# Patient Record
Sex: Female | Born: 1935 | Race: Black or African American | Hispanic: No | Smoking: Former smoker
Health system: Southern US, Community
[De-identification: ages and names within clinical notes are randomized; demographics above are authoritative.]

## PROBLEM LIST (undated history)

## (undated) DIAGNOSIS — I495 Sick sinus syndrome: Secondary | ICD-10-CM

## (undated) DIAGNOSIS — R0989 Other specified symptoms and signs involving the circulatory and respiratory systems: Secondary | ICD-10-CM

## (undated) DIAGNOSIS — I2510 Atherosclerotic heart disease of native coronary artery without angina pectoris: Secondary | ICD-10-CM

## (undated) DIAGNOSIS — N83209 Unspecified ovarian cyst, unspecified side: Secondary | ICD-10-CM

## (undated) DIAGNOSIS — R609 Edema, unspecified: Secondary | ICD-10-CM

## (undated) DIAGNOSIS — R0602 Shortness of breath: Secondary | ICD-10-CM

## (undated) DIAGNOSIS — R079 Chest pain, unspecified: Secondary | ICD-10-CM

## (undated) DIAGNOSIS — R569 Unspecified convulsions: Secondary | ICD-10-CM

## (undated) DIAGNOSIS — D329 Benign neoplasm of meninges, unspecified: Secondary | ICD-10-CM

## (undated) DIAGNOSIS — I480 Paroxysmal atrial fibrillation: Secondary | ICD-10-CM

## (undated) DIAGNOSIS — D496 Neoplasm of unspecified behavior of brain: Secondary | ICD-10-CM

## (undated) DIAGNOSIS — R42 Dizziness and giddiness: Secondary | ICD-10-CM

## (undated) DIAGNOSIS — M1990 Unspecified osteoarthritis, unspecified site: Secondary | ICD-10-CM

## (undated) DIAGNOSIS — M549 Dorsalgia, unspecified: Secondary | ICD-10-CM

## (undated) DIAGNOSIS — F32A Depression, unspecified: Secondary | ICD-10-CM

## (undated) DIAGNOSIS — I4892 Unspecified atrial flutter: Secondary | ICD-10-CM

## (undated) DIAGNOSIS — I219 Acute myocardial infarction, unspecified: Secondary | ICD-10-CM

## (undated) DIAGNOSIS — K219 Gastro-esophageal reflux disease without esophagitis: Secondary | ICD-10-CM

## (undated) DIAGNOSIS — Z7901 Long term (current) use of anticoagulants: Secondary | ICD-10-CM

## (undated) DIAGNOSIS — F320 Major depressive disorder, single episode, mild: Secondary | ICD-10-CM

## (undated) DIAGNOSIS — E785 Hyperlipidemia, unspecified: Secondary | ICD-10-CM

## (undated) DIAGNOSIS — H4910 Fourth [trochlear] nerve palsy, unspecified eye: Secondary | ICD-10-CM

## (undated) DIAGNOSIS — S83519A Sprain of anterior cruciate ligament of unspecified knee, initial encounter: Secondary | ICD-10-CM

## (undated) DIAGNOSIS — N189 Chronic kidney disease, unspecified: Secondary | ICD-10-CM

## (undated) DIAGNOSIS — H8110 Benign paroxysmal vertigo, unspecified ear: Secondary | ICD-10-CM

## (undated) DIAGNOSIS — R413 Other amnesia: Secondary | ICD-10-CM

## (undated) HISTORY — DX: Major depressive disorder, single episode, mild: F32.0

## (undated) HISTORY — DX: Atherosclerotic heart disease of native coronary artery without angina pectoris: I25.10

## (undated) HISTORY — DX: Chronic kidney disease, unspecified: N18.9

## (undated) HISTORY — DX: Sprain of anterior cruciate ligament of unspecified knee, initial encounter: S83.519A

## (undated) HISTORY — DX: Long term (current) use of anticoagulants: Z79.01

## (undated) HISTORY — DX: Fourth (trochlear) nerve palsy, unspecified eye: H49.10

## (undated) HISTORY — DX: Neoplasm of unspecified behavior of brain: D49.6

## (undated) HISTORY — DX: Gastro-esophageal reflux disease without esophagitis: K21.9

## (undated) HISTORY — DX: Benign paroxysmal vertigo, unspecified ear: H81.10

## (undated) HISTORY — DX: Sick sinus syndrome: I49.5

## (undated) HISTORY — DX: Other amnesia: R41.3

## (undated) HISTORY — DX: Hyperlipidemia, unspecified: E78.5

## (undated) HISTORY — DX: Dorsalgia, unspecified: M54.9

## (undated) HISTORY — DX: Benign neoplasm of meninges, unspecified: D32.9

## (undated) HISTORY — DX: Unspecified osteoarthritis, unspecified site: M19.90

## (undated) HISTORY — DX: Acute myocardial infarction, unspecified: I21.9

## (undated) HISTORY — DX: Paroxysmal atrial fibrillation: I48.0

## (undated) HISTORY — DX: Depression, unspecified: F32.A

## (undated) HISTORY — DX: Unspecified atrial flutter: I48.92

## (undated) HISTORY — DX: Chest pain, unspecified: R07.9

## (undated) HISTORY — DX: Unspecified ovarian cyst, unspecified side: N83.209

## (undated) HISTORY — DX: Morbid (severe) obesity due to excess calories: E66.01

## (undated) HISTORY — PX: KNEE SURGERY: SHX244

## (undated) HISTORY — DX: Unspecified convulsions: R56.9

## (undated) HISTORY — DX: Shortness of breath: R06.02

## (undated) HISTORY — DX: Dizziness and giddiness: R42

## (undated) HISTORY — DX: Other specified symptoms and signs involving the circulatory and respiratory systems: R09.89

## (undated) HISTORY — DX: Edema, unspecified: R60.9

---

## 1987-12-23 HISTORY — PX: OTHER SURGICAL HISTORY: SHX169

## 1987-12-23 HISTORY — PX: CRANIOTOMY: SHX93

## 1998-04-16 ENCOUNTER — Ambulatory Visit (HOSPITAL_COMMUNITY): Admission: RE | Admit: 1998-04-16 | Discharge: 1998-04-16 | Payer: Self-pay | Admitting: Cardiology

## 1999-03-11 ENCOUNTER — Emergency Department (HOSPITAL_COMMUNITY): Admission: EM | Admit: 1999-03-11 | Discharge: 1999-03-11 | Payer: Self-pay | Admitting: Emergency Medicine

## 2002-01-31 ENCOUNTER — Encounter: Payer: Self-pay | Admitting: Internal Medicine

## 2002-01-31 ENCOUNTER — Encounter: Admission: RE | Admit: 2002-01-31 | Discharge: 2002-01-31 | Payer: Self-pay | Admitting: Hematology and Oncology

## 2003-02-22 ENCOUNTER — Encounter (INDEPENDENT_AMBULATORY_CARE_PROVIDER_SITE_OTHER): Payer: Self-pay | Admitting: *Deleted

## 2003-02-22 ENCOUNTER — Ambulatory Visit (HOSPITAL_COMMUNITY): Admission: RE | Admit: 2003-02-22 | Discharge: 2003-02-22 | Payer: Self-pay | Admitting: Gastroenterology

## 2004-01-17 ENCOUNTER — Ambulatory Visit (HOSPITAL_COMMUNITY): Admission: RE | Admit: 2004-01-17 | Discharge: 2004-01-17 | Payer: Self-pay | Admitting: Obstetrics and Gynecology

## 2004-04-17 ENCOUNTER — Inpatient Hospital Stay (HOSPITAL_COMMUNITY): Admission: RE | Admit: 2004-04-17 | Discharge: 2004-04-20 | Payer: Self-pay | Admitting: Obstetrics and Gynecology

## 2004-04-17 ENCOUNTER — Encounter (INDEPENDENT_AMBULATORY_CARE_PROVIDER_SITE_OTHER): Payer: Self-pay | Admitting: Specialist

## 2004-12-22 DIAGNOSIS — R569 Unspecified convulsions: Secondary | ICD-10-CM

## 2004-12-22 HISTORY — DX: Unspecified convulsions: R56.9

## 2005-03-20 ENCOUNTER — Inpatient Hospital Stay (HOSPITAL_COMMUNITY): Admission: EM | Admit: 2005-03-20 | Discharge: 2005-03-21 | Payer: Self-pay | Admitting: Emergency Medicine

## 2005-07-18 ENCOUNTER — Emergency Department (HOSPITAL_COMMUNITY): Admission: EM | Admit: 2005-07-18 | Discharge: 2005-07-18 | Payer: Self-pay | Admitting: Emergency Medicine

## 2005-10-23 ENCOUNTER — Inpatient Hospital Stay (HOSPITAL_COMMUNITY): Admission: EM | Admit: 2005-10-23 | Discharge: 2005-10-26 | Payer: Self-pay | Admitting: Emergency Medicine

## 2006-09-14 ENCOUNTER — Encounter: Admission: RE | Admit: 2006-09-14 | Discharge: 2006-09-14 | Payer: Self-pay | Admitting: Neurology

## 2007-01-05 ENCOUNTER — Inpatient Hospital Stay (HOSPITAL_COMMUNITY): Admission: RE | Admit: 2007-01-05 | Discharge: 2007-01-08 | Payer: Self-pay | Admitting: Orthopaedic Surgery

## 2009-04-04 ENCOUNTER — Encounter: Admission: RE | Admit: 2009-04-04 | Discharge: 2009-04-04 | Payer: Self-pay | Admitting: Family Medicine

## 2009-04-07 ENCOUNTER — Ambulatory Visit: Payer: Self-pay | Admitting: Internal Medicine

## 2009-04-07 ENCOUNTER — Inpatient Hospital Stay (HOSPITAL_COMMUNITY): Admission: EM | Admit: 2009-04-07 | Discharge: 2009-04-12 | Payer: Self-pay | Admitting: Emergency Medicine

## 2009-04-23 ENCOUNTER — Inpatient Hospital Stay (HOSPITAL_COMMUNITY): Admission: AD | Admit: 2009-04-23 | Discharge: 2009-04-29 | Payer: Self-pay | Admitting: Cardiology

## 2009-04-27 HISTORY — PX: PACEMAKER INSERTION: SHX728

## 2010-01-24 ENCOUNTER — Encounter: Admission: RE | Admit: 2010-01-24 | Discharge: 2010-01-24 | Payer: Self-pay | Admitting: Neurology

## 2010-04-26 ENCOUNTER — Encounter: Admission: RE | Admit: 2010-04-26 | Discharge: 2010-04-26 | Payer: Self-pay | Admitting: Cardiology

## 2010-04-30 ENCOUNTER — Ambulatory Visit (HOSPITAL_COMMUNITY): Admission: RE | Admit: 2010-04-30 | Discharge: 2010-04-30 | Payer: Self-pay | Admitting: Cardiology

## 2010-04-30 HISTORY — PX: CARDIAC CATHETERIZATION: SHX172

## 2011-03-11 LAB — POCT I-STAT 3, VENOUS BLOOD GAS (G3P V)
Acid-base deficit: 7 mmol/L — ABNORMAL HIGH (ref 0.0–2.0)
O2 Saturation: 66 %
TCO2: 20 mmol/L (ref 0–100)
pH, Ven: 7.301 — ABNORMAL HIGH (ref 7.250–7.300)

## 2011-03-11 LAB — POCT I-STAT 3, ART BLOOD GAS (G3+)
Bicarbonate: 18.4 mEq/L — ABNORMAL LOW (ref 20.0–24.0)
pH, Arterial: 7.328 — ABNORMAL LOW (ref 7.350–7.400)

## 2011-04-01 LAB — CBC
HCT: 34.4 % — ABNORMAL LOW (ref 36.0–46.0)
HCT: 36.5 % (ref 36.0–46.0)
HCT: 39.8 % (ref 36.0–46.0)
Hemoglobin: 12.2 g/dL (ref 12.0–15.0)
Hemoglobin: 13.1 g/dL (ref 12.0–15.0)
MCHC: 33.5 g/dL (ref 30.0–36.0)
MCHC: 33.8 g/dL (ref 30.0–36.0)
MCHC: 33.8 g/dL (ref 30.0–36.0)
MCHC: 34.1 g/dL (ref 30.0–36.0)
MCV: 85.8 fL (ref 78.0–100.0)
Platelets: 282 10*3/uL (ref 150–400)
Platelets: 292 10*3/uL (ref 150–400)
RBC: 4.59 MIL/uL (ref 3.87–5.11)
RDW: 15.6 % — ABNORMAL HIGH (ref 11.5–15.5)
RDW: 15.8 % — ABNORMAL HIGH (ref 11.5–15.5)
RDW: 15.9 % — ABNORMAL HIGH (ref 11.5–15.5)
WBC: 10.3 10*3/uL (ref 4.0–10.5)
WBC: 7.9 10*3/uL (ref 4.0–10.5)

## 2011-04-01 LAB — COMPREHENSIVE METABOLIC PANEL
ALT: 12 U/L (ref 0–35)
Alkaline Phosphatase: 71 U/L (ref 39–117)
BUN: 31 mg/dL — ABNORMAL HIGH (ref 6–23)
CO2: 17 mEq/L — ABNORMAL LOW (ref 19–32)
Chloride: 105 mEq/L (ref 96–112)
Glucose, Bld: 133 mg/dL — ABNORMAL HIGH (ref 70–99)
Potassium: 3.2 mEq/L — ABNORMAL LOW (ref 3.5–5.1)
Sodium: 138 mEq/L (ref 135–145)
Total Bilirubin: 0.6 mg/dL (ref 0.3–1.2)

## 2011-04-01 LAB — CARDIAC PANEL(CRET KIN+CKTOT+MB+TROPI)
CK, MB: 1 ng/mL (ref 0.3–4.0)
Relative Index: INVALID (ref 0.0–2.5)
Total CK: 40 U/L (ref 7–177)
Total CK: 46 U/L (ref 7–177)
Troponin I: 0.03 ng/mL (ref 0.00–0.06)
Troponin I: 0.04 ng/mL (ref 0.00–0.06)

## 2011-04-01 LAB — BASIC METABOLIC PANEL
BUN: 25 mg/dL — ABNORMAL HIGH (ref 6–23)
CO2: 19 mEq/L (ref 19–32)
CO2: 23 mEq/L (ref 19–32)
Calcium: 8.9 mg/dL (ref 8.4–10.5)
Chloride: 105 mEq/L (ref 96–112)
Creatinine, Ser: 1.38 mg/dL — ABNORMAL HIGH (ref 0.4–1.2)
GFR calc Af Amer: 45 mL/min — ABNORMAL LOW (ref >60)
Glucose, Bld: 92 mg/dL (ref 70–99)
Potassium: 3.1 mEq/L — ABNORMAL LOW (ref 3.5–5.1)

## 2011-04-01 LAB — MAGNESIUM: Magnesium: 2.2 mg/dL (ref 1.5–2.5)

## 2011-04-01 LAB — URINALYSIS, ROUTINE W REFLEX MICROSCOPIC
Ketones, ur: NEGATIVE mg/dL
Nitrite: NEGATIVE
Protein, ur: NEGATIVE mg/dL

## 2011-04-01 LAB — HEPARIN LEVEL (UNFRACTIONATED)
Heparin Unfractionated: 0.26 IU/mL — ABNORMAL LOW (ref 0.30–0.70)
Heparin Unfractionated: 0.36 IU/mL (ref 0.30–0.70)
Heparin Unfractionated: 0.47 IU/mL (ref 0.30–0.70)

## 2011-04-01 LAB — PROTIME-INR
INR: 1.2 (ref 0.00–1.49)
Prothrombin Time: 15.7 seconds — ABNORMAL HIGH (ref 11.6–15.2)

## 2011-04-01 LAB — TSH: TSH: 0.817 u[IU]/mL (ref 0.350–4.500)

## 2011-04-01 LAB — URINE MICROSCOPIC-ADD ON

## 2011-04-02 LAB — COMPREHENSIVE METABOLIC PANEL
ALT: 23 U/L (ref 0–35)
ALT: 28 U/L (ref 0–35)
AST: 29 U/L (ref 0–37)
Albumin: 3.3 g/dL — ABNORMAL LOW (ref 3.5–5.2)
Alkaline Phosphatase: 70 U/L (ref 39–117)
Alkaline Phosphatase: 80 U/L (ref 39–117)
CO2: 18 mEq/L — ABNORMAL LOW (ref 19–32)
Chloride: 106 mEq/L (ref 96–112)
GFR calc Af Amer: 34 mL/min — ABNORMAL LOW (ref >60)
GFR calc non Af Amer: 28 mL/min — ABNORMAL LOW (ref >60)
Glucose, Bld: 88 mg/dL (ref 70–99)
Potassium: 3 mEq/L — ABNORMAL LOW (ref 3.5–5.1)
Sodium: 134 mEq/L — ABNORMAL LOW (ref 135–145)
Sodium: 137 mEq/L (ref 135–145)
Total Bilirubin: 1 mg/dL (ref 0.3–1.2)
Total Protein: 7.5 g/dL (ref 6.0–8.3)

## 2011-04-02 LAB — CROSSMATCH: Antibody Screen: NEGATIVE

## 2011-04-02 LAB — DIFFERENTIAL
Basophils Absolute: 0 10*3/uL (ref 0.0–0.1)
Basophils Relative: 0 % (ref 0–1)
Basophils Relative: 0 % (ref 0–1)
Eosinophils Absolute: 0.2 10*3/uL (ref 0.0–0.7)
Eosinophils Absolute: 0.4 10*3/uL (ref 0.0–0.7)
Monocytes Absolute: 0.5 10*3/uL (ref 0.1–1.0)
Monocytes Relative: 10 % (ref 3–12)
Monocytes Relative: 8 % (ref 3–12)
Neutro Abs: 4.1 10*3/uL (ref 1.7–7.7)
Neutro Abs: 6 10*3/uL (ref 1.7–7.7)
Neutrophils Relative %: 68 % (ref 43–77)

## 2011-04-02 LAB — BASIC METABOLIC PANEL
BUN: 18 mg/dL (ref 6–23)
BUN: 20 mg/dL (ref 6–23)
Creatinine, Ser: 1.58 mg/dL — ABNORMAL HIGH (ref 0.4–1.2)
Creatinine, Ser: 1.68 mg/dL — ABNORMAL HIGH (ref 0.4–1.2)
GFR calc Af Amer: 39 mL/min — ABNORMAL LOW (ref >60)
GFR calc non Af Amer: 30 mL/min — ABNORMAL LOW (ref >60)
GFR calc non Af Amer: 32 mL/min — ABNORMAL LOW (ref >60)
GFR calc non Af Amer: 37 mL/min — ABNORMAL LOW (ref >60)
Glucose, Bld: 86 mg/dL (ref 70–99)
Potassium: 3.7 mEq/L (ref 3.5–5.1)
Potassium: 3.9 mEq/L (ref 3.5–5.1)
Potassium: 4.2 mEq/L (ref 3.5–5.1)
Sodium: 143 mEq/L (ref 135–145)

## 2011-04-02 LAB — HEMOGLOBIN AND HEMATOCRIT, BLOOD
HCT: 40.8 % (ref 36.0–46.0)
Hemoglobin: 12.5 g/dL (ref 12.0–15.0)
Hemoglobin: 13 g/dL (ref 12.0–15.0)

## 2011-04-02 LAB — CBC
HCT: 30.9 % — ABNORMAL LOW (ref 36.0–46.0)
HCT: 34.9 % — ABNORMAL LOW (ref 36.0–46.0)
Hemoglobin: 10.3 g/dL — ABNORMAL LOW (ref 12.0–15.0)
MCHC: 33.2 g/dL (ref 30.0–36.0)
MCV: 87.4 fL (ref 78.0–100.0)
Platelets: 167 10*3/uL (ref 150–400)
Platelets: 169 10*3/uL (ref 150–400)
Platelets: 182 10*3/uL (ref 150–400)
Platelets: 203 10*3/uL (ref 150–400)
RBC: 3.59 MIL/uL — ABNORMAL LOW (ref 3.87–5.11)
RDW: 15.1 % (ref 11.5–15.5)
RDW: 15.1 % (ref 11.5–15.5)
RDW: 15.2 % (ref 11.5–15.5)
WBC: 7.7 10*3/uL (ref 4.0–10.5)
WBC: 9.3 10*3/uL (ref 4.0–10.5)

## 2011-04-02 LAB — POCT I-STAT, CHEM 8
BUN: 24 mg/dL — ABNORMAL HIGH (ref 6–23)
Creatinine, Ser: 1.8 mg/dL — ABNORMAL HIGH (ref 0.4–1.2)
Hemoglobin: 12.9 g/dL (ref 12.0–15.0)
Potassium: 3.2 mEq/L — ABNORMAL LOW (ref 3.5–5.1)
Sodium: 135 mEq/L (ref 135–145)
TCO2: 17 mmol/L (ref 0–100)

## 2011-04-02 LAB — POCT CARDIAC MARKERS
CKMB, poc: 3.6 ng/mL (ref 1.0–8.0)
Myoglobin, poc: 415 ng/mL (ref 12–200)

## 2011-04-02 LAB — MAGNESIUM: Magnesium: 2.1 mg/dL (ref 1.5–2.5)

## 2011-04-02 LAB — APTT: aPTT: 34 seconds (ref 24–37)

## 2011-04-02 LAB — URIC ACID: Uric Acid, Serum: 9.8 mg/dL — ABNORMAL HIGH (ref 2.4–7.0)

## 2011-04-02 LAB — LACTIC ACID, PLASMA: Lactic Acid, Venous: 2 mmol/L (ref 0.5–2.2)

## 2011-04-02 LAB — HEMOCCULT GUIAC POC 1CARD (OFFICE): Fecal Occult Bld: POSITIVE

## 2011-04-02 LAB — GLUCOSE, CAPILLARY: Glucose-Capillary: 76 mg/dL (ref 70–99)

## 2011-05-06 NOTE — H&P (Signed)
[name], SCHOONMAKER NO.:  [id number]   MEDICAL RECORD NO.:  [id number]          PATIENT TYPE:  EMS   LOCATION:  MAJO                         FACILITY:  [facility]   PHYSICIAN:  Lucita Ferrara, MD         DATE OF BIRTH:  09-Jul-1932   DATE OF ADMISSION:  04/07/2009  DATE OF DISCHARGE:                              HISTORY & PHYSICAL   PRIMARY GASTROENTEROLOGIST:  Anselmo Rod, MD.   GASTROENTEROLOGIST DURING THIS ADMISSION:  Wilhemina Bonito. Marina Goodell, MD.   PRIMARY CARE PHYSICIAN:  Merlene Laughter. Renae Gloss, MD.   CHIEF COMPLAINT:  Rectal bleeding and crampy abdominal pain.   The patient is a 77 year old with rectal bleeding, which started 1.5  weeks back.  The rectal bleeding is accompanied by crampy left lower  quadrant abdominal pain.  There are episodes of bright red blood per  rectum as well.  Patient also has melenic, dark-color stools.  Abdominal  pain about 5/10 in intensity.  There is no nausea, vomiting, or  hematemesis.  Patient has never had symptoms like this before.   She has seen Dr. Loreta Ave prior for a colonoscopy.  No change in diet,  recent travel.  Colonoscopy, March 2004, for indication rule out polyps  and positive stool guaiac with findings of small sessile polyp and  polypectomy in transverse colon.   REVIEW OF SYSTEMS:  Twelve points are otherwise negative.  There is no  nausea or vomiting.  Patient still has an appetite and requests food in  the emergency room.  There is no urinary frequency, urgency, or burning.  There is no chest pain or shortness of breath.   PAST MEDICAL HISTORY:  Significant for:  1. History of a brain tumor status post craniotomy in 1989 and      subsequent gamma knife surgery in 1998.  2. Seizure disorder.  3. History of CAD status post MI status post PTCA in 1995.  4. Dyslipidemia.  5. Status post abdominal hysterectomy for fibroids.  6. Hiatal hernia/GERD.  7. Osteopenia.  8. Status post tonsillectomy and status post  appendectomy.  9. Benign positional vertigo.  10.Status post fall/left knee trauma/orthopedic surgery.  11.Hypertension.  12.Patient does use nonsteroidal anti-inflammatory medications/Motrin      frequently for osteoarthritic pain.   SOCIAL HISTORY:  Patient denies drugs or alcohol.   ALLERGIES:  PHENYTOIN.   MEDICATIONS AT HOME:  Include Oxycodone, Xopenex, aspirin, Motrin,  Prilosec, Topamax.   PHYSICAL EXAMINATION:  GENERAL:  The patient is in no acute distress.  Generally speaking, the patient is a morbidly obese lady.  She is in  somewhat discomfort complaining of back pain.  HEENT: She is normocephalic, atraumatic.  Mucous membranes are somewhat  dry.  NECK:  Supple.  VITAL SIGNS:  Blood pressure is 107/46, pulse is ranging anywhere from  55 to 113, respiratory rate 18, temperature 98.5, pulse-ox 98% on room  air.  CARDIOVASCULAR:  S1 S2, regular rate, rhythm, no murmurs, rubs, or  clicks.  ABDOMEN:  Soft, is not distended.  There is mild pain to light palpation  in left lower quadrant.  RECTAL:  Stool black, guaiac positive.  EXTREMITIES:  No clubbing, cyanosis, or edema.  NEURO:  Patient is alert and oriented x3.  Cranial nerves II-XII are  grossly intact.  SKIN:  Intact.  PSYCH:  She is anxious, otherwise normal.   EKG shows a normal sinus rhythm.  No ST T-wave changes.  CT scan of the  abdomen and pelvis shows  circumferential wall thickening extends from  mid transverse colon to the rectum.  Imaging features are most  suggestive of an infectious or inflammatory colitis.  There is also a 17  mm left adnexal cystic lesion, which probably is benign.  This is  abnormal in a postmenopausal female.  Followup imaging would be  recommended..  Hemoglobin 13, hematocrit 38.9, potassium 3, BUN 26, creatinine 2.52,  AST, ALT, and alk-phosphatase are 30, 23, and 80 respectively.  INR 1.0.   ASSESSMENT AND PLAN:  The patient is a 77 year old with:  1. Rectal bleeding  accompanied by abdominal cramping, left lower      quadrant abdominal pain.  Computerized tomography (CT) scan showing      colitis.  Rule out ischemia or ischemic colitis.  Gastroenterology      consulted.  We will type, cross, and hold, transfuse.  The patient      is hemoconcentrated.  Intravenous hydration.  We will go ahead and      admit to the stepdown unit and hemodynamic monitoring.  2. Acute on chronic renal failure likely secondary to #1.  Watch renal      function, hold nephrotoxic medications, avoid dye.  3. History of craniotomy for brain tumor, stable.  4. Coronary artery disease (CAD) status post percutaneous transluminal      coronary angioplasty (PTCA) in 1995, stable, no chest pain.   DISCUSSION AND PLAN:  Patient will admitted to stepdown unit.  GI  consulted.  The patient will be typed, crossed, and transfused.  The  rest of the plans will depend on her progress.  DVT prophylaxis with SCD  boots, GI prophylaxis with Protonix 40 b.i.d.      Lucita Ferrara, MD  Electronically Signed     RR/MEDQ  D:  04/07/2009  T:  04/07/2009  Job:  [id number]

## 2011-05-06 NOTE — Discharge Summary (Signed)
NAMEKIANNAH, GRUNOW NO.:  [id number]   MEDICAL RECORD NO.:  [id number]          PATIENT TYPE:  INP   LOCATION:  3728                         FACILITY:  [facility]   PHYSICIAN:  Altha Harm, MDDATE OF BIRTH:  September 08, 1932   DATE OF ADMISSION:  04/07/2009  DATE OF DISCHARGE:  04/12/2009                               DISCHARGE SUMMARY   DISCHARGE DISPOSITION:  Home.   FINAL DISCHARGE DIAGNOSES:  Please refer to the discharge summary  prepared on April 10, 2009 for details of discharge diagnoses.  Please  add to the discharge diagnoses:  1. Sinus bradycardia, asymptomatic.   DISCHARGE MEDICATIONS:  1. Topamax 50 mg p.o. b.i.d.  2. Omeprazole 40 mg p.o. daily.  3. Simvastatin 20 mg p.o. q.h.s.  4. Enteric-coated aspirin 81 mg p.o. daily.  5. Prenatal vitamin one tablet p.o. daily.  6. Flagyl 500 mg p.o. q.8h. x11 days.  7. Ciprofloxacin 250 mg p.o. b.i.d. x1 day.  8. Prednisone 30 mg p.o. daily x2, then 20 mg p.o. daily x2, then 10      mg p.o. daily x2.   CONSULTANTS:  Dr. Ritta Slot, cardiology.   PROCEDURES:  None.   DIAGNOSTIC STUDIES:  None.   Please refer to the discharge summary dated April 10, 2009 for details  of hospital course up until that point.   HOSPITAL COURSE:  Please note that this summary summarizes the hospital  course from April 11, 2009 and April 12, 2009:  1. The patient was essentially ready to be discharged home.  However,      she was noted to have sinus bradycardia which was asymptomatic.      The patient's heart rate went down as low as to the 30s while      sleeping.  Again, the patient had no symptoms as a result of this.      The patient states that this has been ongoing for several years,      and she had been under the care of Dr. Elsie Lincoln.  I called Dr.      Truett Perna office; however, Dr. Elsie Lincoln was unavailable today and      spoke with Dr. Truett Perna partner, Dr. Ritta Slot.  He came and      saw the patient in  consultation and has made a recommendation for a      Holter monitor which will be issued to the patient through their      office.  At this time, he did not feel that any further      intervention was necessary until the patient was further evaluated      with a monitor.  The plan is for the patient to follow up in their      office for the Holter monitor and further definitive decisions made      based upon further evaluation.  The patient had been instructed in      signs and symptoms of low perfusion including dizziness, shortness      of breath, chest pain, crampy abdominal pain any blood in the stool  any syncopal or passing out episodes.  These symptoms have been      discussed both with the patient and with her daughter, Ms.      Anette Guarneri, whom I reached at phone number [phone].      The patient has been cautioned that if she has any of these      symptoms she is to immediately call her cardiologist and report to      them or report to the emergency room.  The patient's blood pressure      has been stable in the 120-160 systolic over 64-85 diastolic.  2. Acute gout.  The patient had an episode of acute exacerbation of      gout.  She was treated with tapering doses of prednisone and is      currently being tapered off on a regimen as noted above.  She has      had no further pain.   This brings the patient's hospital course up-to-date, up to today, April 12, 2009.   DIETARY RESTRICTIONS:  The patient should be on a no-added-salt, heart-  healthy diet.   PHYSICAL RESTRICTIONS:  Activity as tolerated.   FOLLOW UP:  1. The patient is to follow up with Dr. Renae Gloss in the office in 1      week.  2. I recommend that the patient follow up with Dr. Loreta Ave after she has      completed her course of Flagyl to make a better determination at      that time whether this is an ischemic versus infectious colitis.  3. The patient will follow up with Dr. Truett Perna office by  phone      tomorrow and make arrangements for the Holter monitor.   Total time for this discharge 32 minutes.      Altha Harm, MD  Electronically Signed     MAM/MEDQ  D:  04/12/2009  T:  04/12/2009  Job:  [id number]   cc:   Anselmo Rod, M.D.  Merlene Laughter. Renae Gloss, M.D.  Madaline Savage, M.D.  Ritta Slot, MD

## 2011-05-06 NOTE — Op Note (Signed)
NAMENIURKA, BENECKE NO.:  [id number]   MEDICAL RECORD NO.:  [id number]          PATIENT TYPE:  INP   LOCATION:  2020                         FACILITY:  [facility]   PHYSICIAN:  Ritta Slot, MD     DATE OF BIRTH:  Jan 04, 1932   DATE OF PROCEDURE:  04/28/2009  DATE OF DISCHARGE:                               OPERATIVE REPORT   This is a pacemaker lead revision and atrial lead.   INDICATIONS:  Ms. Angelik Walls is a 77 year old African American  female who underwent pacemaker lead implantation yesterday by myself  with a dual-chamber pacemaker with 2 leads, 1 atrial lead and 1  ventricular lead.  She was checked today this morning by the device rep  and the chest x-ray sought by the device rep and the chest x-ray  demonstrated that the atrial lead had dislodged into the ventricle.  It  was therefore decided to bring Ms. Alichia Loudin back to the cath lab  for repositioning of the A lead.   After informed consent, the patient was placed in the supine position.  She was given a total of 4 mg of versed and 75 mcg of fentanyl for light  sedation.  Her left chest area was prepped and draped in the usual  sterile fashion.  Local anesthetic was used to anesthetize the site.  Incision was performed at the original pacer site.  A blunt dissection  was used to carry this down to the pacemaker and the leads.  The  pacemaker was then easily removed from the pacemaker pocket.  A lead  disconnected from the generator.  The V lead remained connected to the  generator at all times.  The A lead sutures were then removed and the  atrial lead was then repositioned on the fluoroscopy into optimal  position.  P-waves were then obtained at 3.5 millivolts and capture was  obtained at 2 volts.  The A lead was then screwed into position and a  threshold obtained.  P-waves were 3.5 millivolts and impedance was 776,  and the threshold of 0.7 and 0.5 milliseconds.  Security of the A lead  was then checked with a straight stylet and the A lead did not appear to  dislodge at any moment.   During the procedure, she went into rapid atrial fibrillation and atrial  flutter.  The atrial leads remained secured at all times.  The A lead  was then connected back up to the can and screwed down to the can using  the screwdriver.   The can was then placed back in the pocket and sutured down with 0 silk.  The A lead was also sutured down with 2 times with 0 silk.  Pacemaker  pocket was irrigated prior to this with 1% clindamycin solution.  The  pocket was then sutured up with 2-0 Vicryl to the deep layer and 4-0  Vicryl to the superficial layer.  Confirmation of the pace lead was then  reconfirmed with x-ray one more time and confirmed excellent position of  the A lead and the V lead.   PLAN:  The  patient will return to the original room.  She will get a  pacemaker check and chest x-ray tomorrow.  Should all look well, we will  let her go home in the morning.      Ritta Slot, MD  Electronically Signed     HS/MEDQ  D:  04/28/2009  T:  04/29/2009  Job:  [id number]

## 2011-05-06 NOTE — Discharge Summary (Signed)
[name], [name]             ACCOUNT NO.:  [id number]   MEDICAL RECORD NO.:  [id number]           PATIENT TYPE:   LOCATION:                                 FACILITY:   PHYSICIAN:  Ritta Slot, MD     DATE OF BIRTH:  [birth year]   DATE OF ADMISSION:  DATE OF DISCHARGE:                               DISCHARGE SUMMARY   DISCHARGE DIAGNOSES:  1. Symptomatic paroxysmal atrial fibrillation and sick sinus syndrome,      status post elective pacer implant this admission with a [employer]      device.  2. Treated hypertension.  3. Mild dehydration on admission, diuretics were discontinued.  4. Recent colitis April 07, 2009.  5. Chronic renal insufficiency with a creatinine of 1.38 at discharge.  6. Coronary artery disease with remote percutaneous coronary      intervention in the 90s with preserved left ventricular function      and no ischemia by Myoview May 2009.  7. Arthritis.   HOSPITAL COURSE:  The patient is a 77 year old female followed by Dr.  Elsie Lincoln and Dr. Renae Gloss.  She recently was admitted with colitis.  She  had some bradycardia during that hospitalization.  She was set up for a  CardioNet monitor as an outpatient.  This showed intermittent episodes  of tachycardia of 140-150.  In the office Apr 23, 2009, she was extremely  fatigued.  She apparently lost 19 pounds during her hospitalization.  She was admitted to telemetry.  Her diuretics were held.  She was put on  low-dose beta-blocker and diltiazem.  She did convert to sinus rhythm,  sinus bradycardia, and her diltiazem was discontinued.  She was placed  on heparin.  On admission, her BUN was 31, creatinine 2.4.  She  stabilized from a cardiac standpoint and we felt she could undergo  pacemaker implant, which was done on Apr 27, 2009, by Dr. Lynnea Ferrier.  Please see his note for complete details.  We feel the patient will be  ready for discharge Apr 28, 2009.   LABORATORY DATA:  INR is 1.2.  Liver functions are normal.   Urinalysis  essentially unremarkable.  TSH is 0.8.  CK-MB and troponins are  negative.  At discharge, her white count is 6.7, hemoglobin 10.6,  hematocrit 31.4, platelets 253.  Sodium 141, potassium 3.6, BUN 12,  creatinine 1.38.  Chest x-ray results are pending post implant, preop  chest x-ray showed no significant abnormalities.  Telemetry is now  paced.   DISPOSITION:  The patient will tentatively be discharged on Apr 28, 2009.  Medications will be metoprolol 25 mg twice a day, Zocor 20 mg a day,  Topamax 50 mg a day, Prilosec 20 mg a day.  She will stop her Maxzide.  She will start two baby aspirins daily for now.  At some point, we may  want to consider Coumadin, although she did convert promptly to sinus  rhythm.      Abelino Derrick, P.A.      Ritta Slot, MD  Electronically Signed    LKK/MEDQ  D:  04/27/2009  T:  04/28/2009  Job:  [id number]   cc:   Merlene Laughter. Renae Gloss, M.D.

## 2011-05-06 NOTE — Discharge Summary (Signed)
[name], Glover             ACCOUNT NO.:  [id number]   MEDICAL RECORD NO.:  [id number]          PATIENT TYPE:  INP   LOCATION:  3728                         FACILITY:  [facility]   PHYSICIAN:  Monte Fantasia, MD  DATE OF BIRTH:  06/16/1932   DATE OF ADMISSION:  04/07/2009  DATE OF DISCHARGE:                               DISCHARGE SUMMARY   PRIMARY CARE PHYSICIAN:  Merlene Laughter. Renae Gloss, MD   PRIMARY GASTROENTEROLOGIST:  Anselmo Rod, MD   DISCHARGE DIAGNOSES:  1. Acute colitis, possibly secondary to ischemia or infection.  2. Rectal bleeding which has been resolved.  3. Coronary artery disease status post bypass which is stable.  4. Acute right elbow gout.  5. Acute-on-chronic renal insufficiency which is improving.  6. History of craniotomy for brain tumor which has been stable.   DISCHARGE MEDICATIONS:  Would be dictated at the time of final discharge  summary.   COURSE DURING THE HOSPITAL STAY:  [name] is a 77 year old  African American lady patient who was admitted on April 07, 2009 with  complaints of passing bright red blood in her stools.  Stated that it  was accompanied by crampy left lower abdominal pain and had multiple  episodes of diarrhea.  The patient underwent CT abdomen and pelvis on  admission without contrast which showed circumferential wall thickening  from mid transverse colon to rectum suggestive of infectious or  inflammatory colitis.  The patient was initially admitted to step-down  unit in 2600 and monitored closely for the same.  GI evaluation was  called for and continued with conservative management.  Initially, the  patient was n.p.o.  Diet was gradually advanced as tolerated.  At  present, the patient is on full diet.  The patient also on admission was  started on IV antibiotics with Cipro and Zosyn for the colitis.  The  patient's abdominal pain improved well and maintained her H&H which was  monitored initially for first 24  hours q. 6-8 hours and which remained  stable.  The patient had no more episodes of any bleeding in her stools  although the diarrhea seems to be resolving gradually.  The patient was  followed up by Dr. Loreta Ave on Monday and no urgent need for colonoscopy was  decided upon.  Colchicine has been discontinued in view of the patient  having diarrhea. The patient at present has been changed to oral  antibiotics, Cipro and Flagyl for her colitis.   The patient had on admission acute on chronic renal insufficiency.  Creatinine on admission was of 2.5 which is now improving to at present  1.58.  The patient is having good urine output and we will continue to  monitor the same with IV fluid hydration.   The patient complained of new onset right elbow swelling on day 2  admission.  The patient initially received IV steroids for the day and  then was changed to colchicine in view of the same.  We will consider  discontinuing colchicine as per GI recommendations as the patient is  having diarrhea at present though I do not think  the diarrhea is related  to the colchicine.  We will change it to p.o. steroids for the acute  gout attack of the right elbow.  The patient at present is started on  prednisone.  We will taper gradually over a period of 10 days.   Coronary artery disease has been stable and the patient has no  complaints in view of the same.  We will continue Zocor at present.   DVT and GI prophylaxis.  The patient is on SCDs and Protonix.      Monte Fantasia, MD  Electronically Signed     MP/MEDQ  D:  04/10/2009  T:  04/11/2009  Job:  [id number]

## 2011-05-06 NOTE — Discharge Summary (Signed)
NAMEVITORIA, [name]             ACCOUNT NO.:  [id number]   MEDICAL RECORD NO.:  [id number]          PATIENT TYPE:  INP   LOCATION:  2020                         FACILITY:  [facility]   PHYSICIAN:  Ritta Slot, MD     DATE OF BIRTH:  16-Jul-1932   DATE OF ADMISSION:  04/23/2009  DATE OF DISCHARGE:  04/29/2009                               DISCHARGE SUMMARY   ADDENDUM   [name]'s pacemaker was evaluated on Apr 28, 2009, a.m. and the  patient had apparently dislodged one of her leads.  She was taken back  to the cath lab by Dr. Lynnea Ferrier on Apr 28, 2009, for atrial lead  repositioning.  Please see Dr. Donavan Burnet note for complete details.  She  tolerated this well and her pacemaker was evaluated on Apr 29, 2009, a.m.  and found to be functioning normally.  Dr. Lynnea Ferrier feels she could be  discharged on Apr 29, 2009.  He would like to start her on Coumadin on  Apr 30, 2009 and then have INR checked on May 02, 2009.  He will see her  in a week for followup.  He has also increased her metoprolol to 25 mg  b.i.d.  Other medications at discharge are as noted above and include,  Zocor 20 mg a day, Topamax 50 mg twice a day, and Prilosec.   LABORATORY DATA AT DISCHARGE:  Sodium 141, potassium 3.6, BUN 12,  creatinine 1.38.  White count 6.4, hemoglobin 10.6, hematocrit 31.4,  platelets 253.  Chest x-ray done prior to discharge shows pacer wires in  good position and some atelectasis, but no pneumothorax.      Abelino Derrick, P.A.      Ritta Slot, MD  Electronically Signed    LKK/MEDQ  D:  04/29/2009  T:  04/29/2009  Job:  [id number]
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
## 2011-05-09 NOTE — Discharge Summary (Signed)
NAME:  [name], [name]                       ACCOUNT NO.:  [id number]   MEDICAL RECORD NO.:  [id number]                   PATIENT TYPE:  INP   LOCATION:  0443                                 FACILITY:  [facility]   PHYSICIAN:  Hal Morales, M.D.             DATE OF BIRTH:  07/29/1932   DATE OF ADMISSION:  04/17/2004  DATE OF DISCHARGE:  04/20/2004                                 DISCHARGE SUMMARY   DISCHARGE DIAGNOSES:  1. Complex left pelvic mass.  2. Pelvic adhesions.  3. Status post hysterectomy.  4. History of brain neoplasm.   OPERATION:  On the date of admission, the patient underwent an exploratory  laparotomy with bilateral salpingo-oophorectomy tolerating procedure well.  The patient, who is status post hysterectomy, was found to have a surgically  absent uterus with a left ovarian septated cyst measuring approximately 6 cm  with normal-appearing left and right tubes and right ovary.  The patient  also was noted to have minor pelvic adhesions.   HISTORY OF PRESENT ILLNESS:  [name] is a 77 year old divorced African-  American female who is status post hysterectomy, para 3-0-0-3 who presents  for exploratory laparotomy with bilateral salpingo-oophorectomy because of a  pelvic mass.  Please see the patient's dictated History and Physical  Examination for details.   PREOPERATIVE PHYSICAL EXAMINATION:  VITAL SIGNS:  Blood pressure 120/70,  weight 233 pounds, height 5 feet 5-1/2 inches tall.  GENERAL:  Within normal limits.  PELVIC:  EGBUS was within normal limits.  Vagina was rugose.  Cervix and  uterus are surgically absent.  Adnexa without tenderness or masses.  Rectovaginal without tenderness or masses.   HOSPITAL COURSE:  On the date of admission, the patient underwent  aforementioned procedures tolerating them well.  Postoperative course was  marked by the patient experiencing vertigo with a condition which the  patient experiences episodically.  This symptom,  however, resolved  spontaneously and the remainder of the patient's hospital stay was  unremarkable.  Postoperative hemoglobin was 12.8, (preoperative hemoglobin  14.4).  By postop day #3, the patient had resumed bowel and bladder function  and was therefore deemed ready for discharge home.   DISCHARGE MEDICATIONS:  1. Tylox 1 to 2 tablets every 4-6 h as needed for pain.  2. Phenergan 25 mg one tablet every 6 hours as needed for nausea.  3. Colace 100 mg one tablet twice daily until bowel movements are regular.  4. Ibuprofen 600 mg one tablet with food every 6 hours for 3 three days then     as needed for pain.  5. Zocor 20 mg daily.  6. Triamterene/hydrochlorothiazide 37.5/2.5 mg one tablet daily.  7. ___________ 20 mg daily.  8. Procardia XL 30 mg daily.   FOLLOWUP:  The patient is scheduled for staple removal at [facility] on Apr 24, 2004.  She will be notified by the office of the exact time  of her appointment.  She is scheduled for postoperative follow up visit with  Dr. Pennie Rushing on May 29, 2004 at 2:30 p.m.  The patient also has an  appointment with Dr. Andi Devon, her primary physician, on Friday, Apr 26, 2004, at 9 a.m. for blood pressure management.   DISCHARGE INSTRUCTIONS:  The patient was given a copy of [facility] postoperative instruction sheet.  She was further advised to avoid  driving for 2 weeks, heavy lifting for 4 weeks, and intercourse for 6 weeks.  The patient's diet was without restriction but she was also advised to add  to her diet 1 banana or orange juice (4 ounces) daily for its potassium  content.   FINAL PATHOLOGY:  Left ovary and fallopian tube:  Serous cystadenofibroma,  fallopian with benign paratubal cyst.  No evidence of borderline change or  malignancy.  Right ovary and fallopian tube:  Serous cystadenofibroma,  benign fallopian tube, no evidence of borderline change or malignancy.     [name].                     Hal Morales, M.D.    EJP/MEDQ  D:  05/12/2004  T:  05/13/2004  Job:  [id number]

## 2011-05-09 NOTE — Op Note (Signed)
[name], KARP                         ACCOUNT NO.:  [id number]   MEDICAL RECORD NO.:  [id number]                   PATIENT TYPE:  AMB   LOCATION:  ENDO                                 FACILITY:  [facility]   PHYSICIAN:  Anselmo Rod, M.D.               DATE OF BIRTH:  June 03, 1932   DATE OF PROCEDURE:  02/22/2003  DATE OF DISCHARGE:                                 OPERATIVE REPORT   PROCEDURE:  Colonoscopy with snare polypectomy x3.   ENDOSCOPIST:  Anselmo Rod, M.D.   INSTRUMENT USED:  Olympus video colonoscope.   INDICATION FOR PROCEDURE:  A 77 year old African-American female with guaiac-  positive stool.  Rule out colonic polyps, masses, etc.   PREPROCEDURE PREPARATION:  Informed consent was procured from the patient.  The patient was fasted for eight hours prior to the procedure and prepped  with a bottle of magnesium citrate and a gallon of GoLYTELY the night prior  to the procedure.   PREPROCEDURE PHYSICAL:  VITAL SIGNS:  The patient had stable vital signs.  NECK:  Supple.  CHEST:  Clear to auscultation.  S1, S2 regular.  ABDOMEN:  Soft with normal bowel sounds.  No masses palpable.   DESCRIPTION OF PROCEDURE:  The patient was placed in the left lateral  decubitus position and sedated with 90 mg of Demerol and 9 mg of Versed  intravenously.  Once the patient was adequately sedate and maintained on low-  flow oxygen and continuous cardiac monitoring, the Olympus video colonoscope  was advanced from the rectum to the cecum.  There was some residual stool in  the colon.  Multiple washes were done.  Two small sessile polyps were snared  from 20 cm.  A couple of smaller polyps were ablated in the area with the  tip of the snare.  Two smaller polyps were ablated in the rectum as well  with the tip of the snare, and another 5-6 mm sessile polyp was snared from  the rectum.  The rest of the colonic mucosa proximal to 20 cm appeared  normal without lesion.  No other  abnormalities were noted.  The appendiceal  orifice and the ileocecal valve were clearly visualized and photographed.   IMPRESSION:  1. Small sessile polyps seen at 20 cm and in the rectum (see description     above), removed by snare polypectomy.  2. Otherwise normal-appearing proximal left colon, transverse colon, right     colon, and cecum.    RECOMMENDATIONS:  1. Await pathology results.  2. Avoid all nonsteroidals, including aspirin.  3. Outpatient follow-up in the next seven to 10 days for further     recommendations as needed.  Anselmo Rod, M.D.    JNM/MEDQ  D:  02/22/2003  T:  02/22/2003  Job:  [id number]   cc:   Merlene Laughter. Renae Gloss, M.D.  [address]  [address]  [place]  [place] [address]  Fax: [phone]

## 2011-05-09 NOTE — Op Note (Signed)
[name], STENGEL             ACCOUNT NO.:  [id number]   MEDICAL RECORD NO.:  [id number]          PATIENT TYPE:  INP   LOCATION:  2550                         FACILITY:  [facility]   PHYSICIAN:  Lubertha Basque. Dalldorf, M.D.DATE OF BIRTH:  [birth year]   DATE OF PROCEDURE:  01/05/2007  DATE OF DISCHARGE:                               OPERATIVE REPORT   PREOPERATIVE DIAGNOSES:  1. 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  2. Left knee anterior cruciate ligament tear.  3. Left knee torn medial meniscus.   POSTOPERATIVE DIAGNOSES:  1. 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  2. Left knee anterior cruciate ligament tear.  3. Left knee torn medial meniscus.   PROCEDURES:  1. Left knee arthroscopic partial medial meniscectomy.  2. Left knee arthroscopic assisted anterior cruciate ligament      reconstruction.  3. Left knee open medial collateral ligament reconstruction.   ANESTHESIA:  General and block.   ATTENDING SURGEON:  Dalldorf.   ASSISTANT:  Carnaghi, P.A.   INDICATIONS FOR PROCEDURE:  The patient is a 77 year old woman with  about 2 years of left knee instability since a probable near  dislocation.  She is known to have had an ACL and MCL insufficient knee  for years.  We have treated her with extensive therapy and bracing.  Unfortunately, she still has a terrible feeling upon ambulation as her  knee gives, even in the brace.  She is offered reconstruction at this  point.  After discussion of possible complications of reaction to  anesthesia, infection, neurovascular injury, stiffness, and recurrent  instability as well as the rare possibilities of DVT, PE, and death.   SUMMARY OF FINDINGS AND PROCEDURE:  Under general anesthesia and a  block, a left knee operation was performed.  We started with an  arthroscopy, and she did have a small medial meniscus tear addressed  about a 10% partial medial meniscectomy.  She really had very little in  the way of  degenerative change in any of the 3 compartments.  Her ACL  was completely gone, but the PCL appeared to have scarred down and was  quite functional.  I performed an arthroscopic assisted ACL  reconstruction using middle third patellar tendon allograft.  We then  made an incision and reconstructed the MCL with similar material.  Bryna Colander assisted throughout and was invaluable to the completion of the  case and helped position and retract and actually fashioned the  allografts on the back table while I performed some of the open and  arthroscopic portions of the procedure.  This greatly minimized our OR  time.   DESCRIPTION OF THE PROCEDURE:  The patient was taken to the operating  suite where general anesthetic was applied without difficulty.  She was  also given a block in the preanesthesia area.  She was positioned supine  and prepped and draped in normal sterile fashion.  After the  administration of IV Kefzol, an arthroscopy of the left knee was  performed through 2 inferior portals.  Findings were as noted above, and  procedure consisted of a partial medial meniscectomy  back to stable  structures, removing about 5 or 10% of the medial meniscus.  There were  very little in the way degenerative changes in any of the 3  compartments.  The PCL appeared to have healed while the ACL was  completely gone.  Lateral meniscus was intact.  I placed a guide just  anterior to the PCL and placed a guidewire through a separate incision  on the anterior aspect of the knee up into the joint.  This was then  over reamed to a diameter of 10 mm.  A second guide was placed through  this tunnel in the over-the-top position after a conservative  notchplasty was done with a bur.  Utilizing the second guide, a  guidewire was placed through the tibial tunnel, through the femur, out  the proximal thigh.  I over reamed the distal femur using this guidewire  to a diameter of about 9 mm and depth of 2.5  cm.  Bony debris was  removed from the knee with a shaver.  An allograft had been defrosted on  the back table, and we made 2 grafts out of this bone-tendon-bone  structure.  We made one that fit through 9-and 10-mm tunnels and a  second that fit through two 10-mm tunnels.  Drill holes were placed in  the bone plugs with a PDS suture at one end and a wire in the other.  We  took the smaller of these grafts and utilized the guidewire already in  place to pull the leading sutures through the tibial tunnel, through the  femoral tunnel, and out the proximal thigh.  We pulled on these to  direct the bone plugs through the tibial tunnel and into the femoral  tunnel.  Care was taken to keep the tendinous surface of the graft in  the posterior direction as it entered the femoral tunnel.  We then  placed a guidewire through the medial portal seen to enter the femoral  tunnel and placed an 8 x 25 metal Linvatec screw over this guide wire in  interference fashion to secure the femoral bone plug in the femur.  I  then placed traction on the distal bone plug and could not dislodge the  well-fixed proximal bone plug.  We then made an incision along the  medial aspect the knee after elevation, exsanguination and tourniquet  inflation.  Dissection was carried down to the medial aspect of the  knee.  I entered the knee through a small arthrotomy just above the  medial meniscus.  I excised a portion of this redundant medial capsule  and then exposed the medial epicondyle of the femur.  We used a  guidewire and drilled to a diameter of 10 mm with one of the reamers.  I  then used the second of our allografts and placed the bone plug in the  femur and secured with an 8 x 20 metal Linvatec interference screw.  We  then found a similar area about 4 cm or 5 cm below the joint line on the  medial aspect of the tibia.  We fixed the opposite end of this allograft in a similar fashion with an identical interference  screw.  We  supplemented the fixation here with an Arthrex low-profile screw and  soft tissue washer which was placed over the distal most portion of the  soft tissue of the allograft just above the interference screw level.  I  then placed tension on the trailing bone plug in  the tibial tunnel for  the ACL reconstruction and secured this end with an 8 x 25 metal  Linvatec screw.  We then examined the knee, and her varus and valgus  stability was excellent, and her Lachman's instability had been  eliminated as well.  She easily flexed to 90 degrees and came to full  extension.  Arthroscopic equipment was all removed.  We irrigated the  medial wound followed by reapproximation of deep tissues with 0 and 2-0  undyed Vicryl.  The tourniquet was deflated, and a small amount of  bleeding was easily controlled with Bovie cautery.  We then  reapproximated subcutaneous tissues further with 2-0 undyed Vicryl and  closed the skin with staples.  Adaptic was applied followed by dry gauze  and a loose Ace wrap and a knee immobilizer with the knee in extension.  Estimated blood loss and intraoperative fluids as well as accurate  tourniquet time can be obtained from anesthesia records.   DISPOSITION:  The patient was extubated in the operating room and taken  to the recovery in stable condition.  She was to be admitted to the  orthopedic surgery service for appropriate postoperative care to include  perioperative antibiotics and aspirin and immediate mobilization for DVT  prophylaxis.      Lubertha Basque Jerl Santos, M.D.  Electronically Signed    PGD/MEDQ  D:  01/05/2007  T:  01/06/2007  Job:  [id number]

## 2011-05-09 NOTE — Discharge Summary (Signed)
[name], [name]             ACCOUNT NO.:  [id number]   MEDICAL RECORD NO.:  [id number]          PATIENT TYPE:  INP   LOCATION:  5017                         FACILITY:  [facility]   PHYSICIAN:  Lindwood Qua, P.A. DATE OF BIRTH:  11-24-1932   DATE OF ADMISSION:  01/05/2007  DATE OF DISCHARGE:  01/08/2007                               DISCHARGE SUMMARY   ADMITTING DIAGNOSES:  1. 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  2. History of seizures.  3. Hypertension.  4. Esophageal reflux.   DISCHARGE DIAGNOSES:  1. 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  2. History of seizures.  3. Hypertension.  4. Esophageal reflux.   OPERATIONS:  ACL/MCL reconstruction left knee.   BRIEF HISTORY:  Ms. Loschiavo is a 77 year old black female patient well-  known to our practice who in July 2006, climbing stairs fell, tearing  her MCL and ACL ligaments in her left knee.  We had treated this  conservatively for quite some time, and despite physical therapy and  bracing, she remains with some instability.  We discussed treatment  options with her; that being knee reconstructive surgery.   PERTINENT LABORATORY AND X-RAY FINDINGS:  WBC 6.3, hemoglobin 13.9,  hematocrit 41.8, platelets at 225, sodium 136, potassium 4.4, glucose  87, BUN 26, creatinine 1.39, urinalysis normal.   COURSE IN THE HOSPITAL:  She was kept on her home medicine, which will  be outlined at the end of the dictation.  She was given lactated ringers  IV postoperative, IV Ancef a gram q.8 h. X3 doses, PCA morphine pump,  and then Vicodin p.o. for pain, Reglan for nausea.  She was kept in a  knee immobilizer to be touchdown, nonweightbearing with the help of  physical therapy.  The first day of postoperative, her blood pressure  was 110/74, temperature 99, lungs were clear, abdomen was soft, wound  was benign, good neurovascular status to  her toes.  Her brace had  remained off.  She had worked with physical therapy for transfers and a  nonweightbearing gait.  Dressing was changed the second day  postoperative, and temperature spiked to 99.  Lungs were clear, abdomen  was soft, wound was benign on her knee.  No sign of infection or  irritation, and she was discharged to home Friday.   CONDITION ON DISCHARGE:  Improved.   FOLLOWUP:  She was discharged on a low-sodium for healthy diet.  Dressing to be changed daily.  Any sign of infection to call the office,  [phone], also for an appointment in 10 days.  Nonweightbearing on the  left side.  [facility] would arrange a home CPM machine for 0 to  50 degrees and advance as tolerated. Percocet 1 or 2 every 4-6 hours as  needed for pain. She is to continue to use ice and elevation.   DISCHARGE MEDICATIONS:  She is to be kept on her home medications, which  were:  1. Diazide 37.5/25 mg 1 a day.  2. Prilosec 20 mg 1  a day.  3. Aspirin 81 mg a day.  4. Lyrica 75 mg 1 twice a day.  5. Simvastatin 20 mg 1 a day.  6. Triazolam as needed for anxiety.      Lindwood Qua, P.A.     MC/MEDQ  D:  02/11/2007  T:  02/12/2007  Job:  [id number]
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
## 2011-05-09 NOTE — Op Note (Signed)
NAME:  [name], [name]                       ACCOUNT NO.:  [id number]   MEDICAL RECORD NO.:  [id number]                   PATIENT TYPE:  INP   LOCATION:  0001                                 [facility]ACILITY:  [facility]   PHYSICIAN:  Hal Morales, M.D.             DATE OF BIRTH:  1932/06/23   DATE OF PROCEDURE:  04/17/2004  DATE OF DISCHARGE:                                 OPERATIVE REPORT   ADDENDUM:   FINDINGS:  The patient was status post total vaginal hysterectomy so that  the uterus was surgically absent at the time of surgery.                                               Hal Morales, M.D.    VPH/MEDQ  D:  04/17/2004  T:  04/17/2004  Job:  [id number]
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
## 2011-05-09 NOTE — Op Note (Signed)
NAME:  JASA, DUNDON                       ACCOUNT NO.:  [id number]   MEDICAL RECORD NO.:  [id number]                   PATIENT TYPE:  INP   LOCATION:  0001                                 [facility]ACILITY:  [facility]   PHYSICIAN:  Hal Morales, M.D.             DATE OF BIRTH:  19-Feb-1932   DATE OF PROCEDURE:  04/17/2004  DATE OF DISCHARGE:                                 OPERATIVE REPORT   PREOPERATIVE DIAGNOSIS:  Left ovarian cyst, rule out carcinoma.   POSTOPERATIVE DIAGNOSIS:  Left ovarian cyst, probable cystadenoma.   SURGERY:  1. Exploratory laparotomy.  2. Bilateral salpingo-oophorectomy.   SURGEON:  Hal Morales, M.D.   FIRST ASSISTANT:  [name].   ANESTHESIA:  General orotracheal.   ESTIMATED BLOOD LOSS:  200 mL.   COMPLICATIONS:  None.   FINDINGS:  The left ovary was enlarged to approximately 7 x 5 cm and was  multicystic.  There were no excrescences.  There were no peritoneal lesions.  The right ovary contained a single 1 cm cyst but was otherwise within normal  limits.  Examination of the upper abdomen was negative.   PROCEDURE:  The patient was taken to the operating room after appropriate  identification and placed on the operating table.  After the attainment of  adequate general anesthesia with the patient in the modified lithotomy  position, the abdomen, perineum, and vagina were prepped with multiple  layers of Betadine.  A Foley catheter was inserted into the bladder and  connected to straight drainage.  The abdomen was draped as  a sterile field.  A midline incision was made in the abdomen and the abdomen opened in layers.  The peritoneum was entered.  Pelvic washings were obtained and sent.  The  self-retaining Balfour retractor was placed in the abdominal incision and  the bowel packed cephalad.  The left ovary was identified, elevated into the  operative field, and lysis of adhesions around the bowel mesentery allowed  it to be free  up.  The left ureter was identified.  The left  infundibulopelvic ligament was clamped, cut, suture ligated, and tied with a  free tie.  The left tube and ovary were excised and sent for frozen section.  The right ovary was then identified, lifted into the operative field, and  dissected off the right pelvic sidewall.  The infundibulopelvic ligament was  then clamped, cut, tied with a free tie, and suture ligated.  The right tube  and ovary were then removed from the operative field.  Copious irrigation  was carried out and hemostasis noted to be adequate.  All instruments were  removed from the peritoneal cavity.  All sutures to this point were 0  Vicryl.  The abdominal incision was closed in a single layer in a Smead-  Jones fashion, incorporating the peritoneum and fascia in a single suture  from each apex to the midline and tied in  the midline.  The subcutaneous  tissue was copiously irrigated and made hemostatic with Bovie cautery.  It  was reapproximated with interrupted sutures of 3-0 Vicryl.  Skin staples  were then applied to the skin incision and a sterile dressing applied.  Prior to placement of the staples, 20 mL of 0.25% Marcaine was left in the  incision and the staples applied.  A sterile dressing was applied and the  patient awakened from general anesthesia and taken to the recovery room in  satisfactory condition, having tolerated the procedure well with sponge and  instrument counts correct.   SPECIMENS TO PATHOLOGY:  Right and left tube and ovary.  Initial frozen  section diagnosis:  Left ovarian cystadenoma.                                               Hal Morales, M.D.    VPH/MEDQ  D:  04/17/2004  T:  04/17/2004  Job:  [id number]   cc:   Merlene Laughter. Renae Gloss, M.D.  [address]  [address]  [place]  [place] [address]  Fax: [phone]   Lindaann Slough, M.D.  [address]  [place]  [place] [address]  Fax: [phone]

## 2011-05-09 NOTE — Procedures (Signed)
INDICATIONS:  This 77 year old woman with an episode of syncope being  evaluated for possible seizure. This is a routine 17-channel EEG with one  channel devoted to EKG utilizing the international 10-20 lead placement  system.  The patient was described clinically as being awake and drowsy  throughout the recording and electrographically she also appear to be awake  and drowsy.  While wide awake, the background consisted of a well-organized,  well-developed, well-modulated, 11 Hz alpha activity to the posterior  regions and reactive to eye opening.  While drowsy, the background became  attenuated with decreased frequency and amplitude.  Occasional central sharp  activity and central projected monomorphic delta activity was seen  consistent with drowsiness.  Activation procedures included photic  stimulation which did not produce any significant change in the background  activity.  Hyperventilation was not performed.  The EKG monitor reveals a  mild bradycardia with a rate of about 54.   CONCLUSION:  Normal awake and drowsy EEG without seizure activity seen  during the course of the recording.  Mild bradycardia was noted.  Clinical  correlation is recommended.      Catherine A. Orlin Hilding, M.D.  Electronically Signed     ZOX:WRUE  D:  10/24/2005 19:36:54  T:  10/25/2005 07:54:37  Job #:  [id number]   cc:   Lynelle Doctor, M.D.

## 2011-05-09 NOTE — Discharge Summary (Signed)
[name], MENZEL NO.:  [id number]   MEDICAL RECORD NO.:  [id number]          PATIENT TYPE:  INP   LOCATION:  4740                         FACILITY:  [facility]   PHYSICIAN:  Madaline Savage, M.D.DATE OF BIRTH:  [birth year]   DATE OF ADMISSION:  03/20/2005  DATE OF DISCHARGE:  03/21/2005                                 DISCHARGE SUMMARY   [name] is a 77 year old female patient of Dr. Chanda Busing who had an  episode of chest pain as she came to the hospital. She had had several  episodes at home, and it came back. She eventually got sick to her stomach  and vomited. She took sublingual nitroglycerin, however she had no relief  from this and her daughter brought her to the emergency room. She does have  a prior medical history of coronary artery disease, PTCA 10+ years ago by  Dr. Garnette Scheuermann. She has been followed by Dr. Elsie Lincoln since 1997 and her last  office visit was in 2001. She has a right brain tumor with craniotomy times  one and gamma knife surgery dating back to 2001. She also has had  hyperlipidemia and hypertension. Her CK-MB's were all negative times three  and her troponin is negative times three. Her other labs were stable. Her  blood pressure was stable and on March 21, 2005, she was considered stable  to be discharged home to have an outpatient workup. Blood pressure was  115/60, pulse 51, respirations 20, temperature 97.4. Hemoglobin 13.2,  hematocrit 38.2, WBC 6.3, and platelet count 193,000. Sodium 139, potassium  3.6, BUN 22, creatinine 1.3, and glucose 94. TSH was 2.051. Total  cholesterol was 155, triglycerides 84, HDL 50, and LDL was 98. There is no  chest x-ray in the chart at this time.   DISCHARGE MEDICATIONS:  1.  Lisinopril 5 mg one tablet at bedtime.  2.  HCTZ/triamterene one-half p.r.n. swelling.  3.  Prilosec 20 mg one time per day.  4.  Aspirin 81 mg one time per day.  5.  Zocor 20 mg at bedtime.   __________ as follows. She  will have a carotid Doppler on March 28, 2005, at  1 p.m.; Persantine Cardiolite on March 28, 2005. She will be followed by Dr.  Elsie Lincoln on March 28, 2005.   DISCHARGE DIAGNOSES:  1.  Chest pain. No myocardial infarction with negative cardiac enzymes.  2.  Prior history of coronary artery disease with remote PCI.  3.  History of brain tumor with gamma knife surgery.  4.  History of hyperlipidemia.  5.  History of gastroesophageal reflux disease.  6.  History of vertigo.      Be   BB/MEDQ  D:  06/11/2005  T:  06/11/2005  Job:  [id number]   cc:   Merlene Laughter. Renae Gloss, M.D.  [address]  [address]  [place]  [place] [address]  Fax: [phone]

## 2011-05-09 NOTE — H&P (Signed)
NAME:  [name], [name]                       ACCOUNT NO.:  [id number]   MEDICAL RECORD NO.:  [id number]                   PATIENT TYPE:  INP   LOCATION:  NA                                   FACILITY:  [facility]   PHYSICIAN:  Hal Morales, M.D.             DATE OF BIRTH:  [birth year]   DATE OF ADMISSION:  04/17/2004  DATE OF DISCHARGE:                                HISTORY & PHYSICAL   HISTORY OF PRESENT ILLNESS:  [name] is a 77 year old divorced African-  American female who is status post hysterectomy, para 3-0-0-3, who presents  for exploratory laparotomy with bilateral salpingo-oophorectomy because of a  pelvic mass.  In December 2004 during an evaluation by Dr. Brunilda Payor for  microhematuria, a CT scan of the pelvis with contrast revealed a 5.4 x 6.9  cm septated cystic mass within the left pelvis.  There was no adenopathy or  free fluid observed.  A follow-up transabdominal/transvaginal ultrasound  confirmed a large cystic mass in the left pelvis measuring 6.1 x 5.1 x 5.1  cm, which is complex and contains several thick septations with internal  blood flow.  There was no observed free fluid.  A CA125 done January 2005  was 8.3 (within normal limits).  The patient has been totally asymptomatic  with this pelvic mass, denying any pelvic pain, fever, nausea, vomiting,  diarrhea, urinary tract symptoms, vaginitis symptoms, change in bowel  habits, or bloating.  Due to the suspicious nature of this lesion for cystic  ovarian neoplasm, the patient has consented for excision of this mass.   PAST MEDICAL HISTORY:  Obstetrical history:  Gravida 3, para 3-0-0-3.  The  patient denies any problems with her pregnancies.   GYN history:  Menarche 77 years old.  The patient has had a hysterectomy  because of fibroids in 1978.  She denies any history of abnormal Pap smears  of sexually transmitted diseases.  Her last normal Pap smear and mammogram  were over 10 years ago (patient declines  these studies).   Medical history is positive for brain tumor.  She is status post myocardial  infarction x2.  She has headaches, has a history of seizures, hiatal hernia,  and osteopenia.   Surgical history:  1942, tonsillectomy.  1965, appendectomy.  1978, vaginal  hysterectomy with anterior repair.  1989, brain surgery.  1995, angioplasty.  1998, brain surgery.  The patient admits to having received several blood  transfusions.  She denies any problems with anesthesia.   FAMILY HISTORY:  Positive for cancer, stroke, heart disease, and  hypertension.   SOCIAL HISTORY:  The patient is divorced, and she has retired as a Production designer, theatre/television/film  at [employer].   HABITS:  The patient does not smoke.  She drinks alcohol only on special  occasions.   CURRENT MEDICATIONS:  1. Zocor 20 mg one tablet daily.  2. Triamterene/HCTZ 37.5/25 mg one tablet daily.  3. Amidrine (Midrin) one  tablet every hour as needed until headache resolves     (not to exceed five tablets).  4. Triazolam 0.25 mg two tablets at bedtime.  5. Omeprazole 20 mg one tablet daily.  6. Afeditab CR 60 mg one tablet daily.   ALLERGIES:  The patient is allergic to DILANTIN.   REVIEW OF SYSTEMS:  Negative.   PHYSICAL EXAMINATION:  VITAL SIGNS:  Blood pressure 120/70, weight is 233  pounds.  Height is 5 feet 5-1/2 inches tall.  NECK:  Supple without thyromegaly or adenopathy.  CARDIAC:  Regular rate and rhythm.  There is no murmur.  CHEST:  Lungs are clear to auscultation.  There are no wheezes, rales, or  rhonchi.  BACK:  No CVA tenderness.  ABDOMEN:  Bowel sounds are present.  It is soft without tenderness,  guarding, rebound, or organomegaly.  There are no masses.  EXTREMITIES:  Without clubbing, cyanosis, or edema.  PELVIC:  EG, BUS is within normal limits.  Vagina is rugous.  Cervix and  uterus are surgically absent.  Adnexa without tenderness or masses.  Rectovaginal without tenderness or masses.   IMPRESSION:  1. Complex cystic  left pelvic mass.  2. History of brain neoplasm.   DISPOSITION:  A discussion was held with the patient regarding the  implications for her procedure along with its risks, which include but are  not limited to reaction to anesthesia, damage to adjacent organs, infection,  and excessive bleeding.  The patient has consented to undergo exploratory  laparotomy with bilateral salpingo-oophorectomy with removal of pelvic mass,  along with a possible omentectomy, possible lymphadenectomy, at [facility] on April 17, 2004, at 8:30 a.m.     [name].                    Hal Morales, M.D.    EJP/MEDQ  D:  04/12/2004  T:  04/12/2004  Job:  [id number]

## 2013-03-08 ENCOUNTER — Ambulatory Visit: Payer: Self-pay | Admitting: Cardiovascular Disease

## 2013-03-08 DIAGNOSIS — I480 Paroxysmal atrial fibrillation: Secondary | ICD-10-CM | POA: Insufficient documentation

## 2013-03-08 DIAGNOSIS — I4891 Unspecified atrial fibrillation: Secondary | ICD-10-CM

## 2013-03-08 DIAGNOSIS — Z7901 Long term (current) use of anticoagulants: Secondary | ICD-10-CM | POA: Insufficient documentation

## 2013-04-23 ENCOUNTER — Other Ambulatory Visit: Payer: Self-pay | Admitting: Cardiovascular Disease

## 2013-04-23 LAB — PACEMAKER DEVICE OBSERVATION

## 2013-05-06 LAB — REMOTE PACEMAKER DEVICE
ATRIAL PACING PM: 74
BAMS-0001: 150 {beats}/min
BAMS-0003: 60 {beats}/min
RV LEAD IMPEDENCE PM: 510 Ohm
RV LEAD THRESHOLD: 1.375 V
VENTRICULAR PACING PM: 1

## 2013-05-31 ENCOUNTER — Ambulatory Visit (INDEPENDENT_AMBULATORY_CARE_PROVIDER_SITE_OTHER): Payer: Medicare Other | Admitting: Pharmacist Clinician (PhC)/ Clinical Pharmacy Specialist

## 2013-05-31 VITALS — BP 120/60 | HR 76

## 2013-05-31 DIAGNOSIS — I4891 Unspecified atrial fibrillation: Secondary | ICD-10-CM

## 2013-05-31 DIAGNOSIS — Z7901 Long term (current) use of anticoagulants: Secondary | ICD-10-CM

## 2013-05-31 LAB — POCT INR: INR: 1.9

## 2013-07-12 ENCOUNTER — Ambulatory Visit (INDEPENDENT_AMBULATORY_CARE_PROVIDER_SITE_OTHER): Payer: Medicare Other | Admitting: Pharmacist Clinician (PhC)/ Clinical Pharmacy Specialist

## 2013-07-12 VITALS — BP 124/62 | HR 72

## 2013-07-12 DIAGNOSIS — Z7901 Long term (current) use of anticoagulants: Secondary | ICD-10-CM

## 2013-07-12 DIAGNOSIS — I4891 Unspecified atrial fibrillation: Secondary | ICD-10-CM

## 2013-07-12 LAB — POCT INR: INR: 2.3

## 2013-07-23 ENCOUNTER — Other Ambulatory Visit: Payer: Self-pay | Admitting: Cardiovascular Disease

## 2013-07-23 DIAGNOSIS — I495 Sick sinus syndrome: Secondary | ICD-10-CM

## 2013-08-11 ENCOUNTER — Encounter: Payer: Self-pay | Admitting: *Deleted

## 2013-08-11 LAB — REMOTE PACEMAKER DEVICE
AL AMPLITUDE: 5 mv
AL THRESHOLD: 0.5 V
RV LEAD AMPLITUDE: 12 mv
RV LEAD IMPEDENCE PM: 460 Ohm
RV LEAD THRESHOLD: 1.5 V

## 2013-08-23 ENCOUNTER — Ambulatory Visit (INDEPENDENT_AMBULATORY_CARE_PROVIDER_SITE_OTHER): Payer: Medicare Other | Admitting: Pharmacist Clinician (PhC)/ Clinical Pharmacy Specialist

## 2013-08-23 VITALS — BP 140/70 | HR 64

## 2013-08-23 DIAGNOSIS — I4891 Unspecified atrial fibrillation: Secondary | ICD-10-CM

## 2013-08-23 DIAGNOSIS — Z7901 Long term (current) use of anticoagulants: Secondary | ICD-10-CM

## 2013-08-26 ENCOUNTER — Encounter: Payer: Self-pay | Admitting: Cardiology

## 2013-08-29 ENCOUNTER — Encounter: Payer: Self-pay | Admitting: Cardiovascular Disease

## 2013-08-30 ENCOUNTER — Encounter: Payer: Self-pay | Admitting: Cardiovascular Disease

## 2013-08-30 ENCOUNTER — Ambulatory Visit (INDEPENDENT_AMBULATORY_CARE_PROVIDER_SITE_OTHER): Payer: Medicare Other | Admitting: Cardiovascular Disease

## 2013-08-30 VITALS — BP 140/80 | HR 60 | Resp 16 | Ht 67.0 in | Wt 220.7 lb

## 2013-08-30 DIAGNOSIS — E669 Obesity, unspecified: Secondary | ICD-10-CM | POA: Insufficient documentation

## 2013-08-30 DIAGNOSIS — I10 Essential (primary) hypertension: Secondary | ICD-10-CM

## 2013-08-30 DIAGNOSIS — R569 Unspecified convulsions: Secondary | ICD-10-CM

## 2013-08-30 DIAGNOSIS — N182 Chronic kidney disease, stage 2 (mild): Secondary | ICD-10-CM

## 2013-08-30 DIAGNOSIS — Z950 Presence of cardiac pacemaker: Secondary | ICD-10-CM

## 2013-08-30 DIAGNOSIS — I4891 Unspecified atrial fibrillation: Secondary | ICD-10-CM

## 2013-08-30 DIAGNOSIS — I2510 Atherosclerotic heart disease of native coronary artery without angina pectoris: Secondary | ICD-10-CM

## 2013-08-30 NOTE — Assessment & Plan Note (Signed)
Episodes are only mildly symptomatic, manifesting as palpitations, often noticed at night. Her overall burden of atrial fibrillation is less than 1%. Ventricular rate control is good during the spells of atrial fibrillation. She is on appropriate anticoagulation with warfarin and has remarkably consistent anticoagulation levels. Antiarrhythmic therapy does not appear to be justified. Continue verapamil for rate control.

## 2013-08-30 NOTE — Assessment & Plan Note (Signed)
Well controlled 

## 2013-08-30 NOTE — Progress Notes (Signed)
Patient ID: [name], female   DOB: Dec 11, 1932, 79 y.o.   MRN: [id number]     Reason for office visit Atrial fibrillation, pacemaker  [name] has done well since her last appointment but any major health challenges. She has rare episodes of palpitations. She has not had any bleeding problems and has exceptionally steady anticoagulation levels monitored in our Coumadin clinic. She does not have any focal neurological events or other signs of embolism. She has never had a stroke or TIA. Shows no evidence of structural heart disease by previous echo and nuclear stress testing. She has preserved left ventricular systolic function. There are subtle signs of diastolic function abnormality.    Allergies  Allergen Reactions  . Dilantin [Phenytoin Sodium Extended] Hives  . Phenobarbital Hives    Current Outpatient Prescriptions  Medication Sig Dispense Refill  . allopurinol (ZYLOPRIM) 100 MG tablet Take 100 mg by mouth daily.      [name] aspirin 81 MG tablet Take 81 mg by mouth daily.      . hydrochlorothiazide (HYDRODIURIL) 25 MG tablet Take 25 mg by mouth daily.      . Multiple Vitamins-Minerals (MULTIVITAMIN PO) Take by mouth daily.      . nitroGLYCERIN (NITROLINGUAL) 0.4 MG/SPRAY spray Place 1 spray under the tongue every 5 (five) minutes as needed for chest pain.      [name] omeprazole (PRILOSEC) 20 MG capsule Take 20 mg by mouth daily.      . potassium chloride (K-DUR) 10 MEQ tablet Take 10 mEq by mouth daily.      . pravastatin (PRAVACHOL) 20 MG tablet Take 20 mg by mouth at bedtime.      . topiramate (TOPAMAX) 50 MG tablet Take 50 mg by mouth 2 (two) times daily.      . verapamil (CALAN-SR) 120 MG CR tablet Take 120 mg by mouth at bedtime.      . WARFARIN SODIUM PO Take by mouth.       No current facility-administered medications for this visit.    Past Medical History  Diagnosis Date  . Seizure 2006  . Chest pain     2D Echo 04/21/08 EF = >55%. Moderate tricuspid regurgitation.  Persantine Myovoew stress test 04/21/08 EF = 81%, no evidence of ischemia noted.  . Coronary artery disease   . Dyslipidemia   . Chronic edema     Mild LE edema  . Severe obesity   . Chronic anticoagulation     PAF  . SOB (shortness of breath) on exertion     Class II. Catheterization 04/30/10 showed mild CAD with mildly elevated right heart pressures.   . Arthritis   . Back pain     Secondary to arthritis  . Mild depression   . Brain tumor     On right side. Removed in 1989  . Vertigo   . GERD (gastroesophageal reflux disease)   . Chronic kidney disease     Mild, stage 2  . ACL (anterior cruciate ligament) rupture     Left  . Meningioma     Resection in 1998  . Ovarian cyst     Benign. Had a salpiingoophorectomy in 2005.  [name] Hyperlipidemia     On pravastatin  . PAF (paroxysmal atrial fibrillation)     On coumadin. St. Jude PPM (serial [id number]) inserted 2010  . Paroxysmal atrial flutter   . Sinus node dysfunction     S/P implantation of a dual-chamber permanent pacemaker in 2010  .  Tachycardia-bradycardia syndrome     S/P implantation of dual-chamber permanent pacemaker in 2010  . Sick sinus syndrome   . Bruit     Duplex doppler 03/28/05 Mildly abnormal. *Right subclavian artery: Less than 50% diameter reduction.  . Acute MI   . Coronary atherosclerosis     Minimal    Past Surgical History  Procedure Laterality Date  . Brain tumor removal  1989  . Craniotomy  1989    (Brain tumor) With gamma knife procedure as well.  . Pacemaker insertion  04/27/09    Implanted by Dr. Lynnea Ferrier. St. Jude Accent DR (serial [id number])  . Cardiac catheterization  04/30/10    Showed mild CAD with mildly elevated right heart pressures. (Dr. Lynnea Ferrier)    Family History  Problem Relation Age of Onset  . Stroke Mother   . Heart disease Father   . Heart disease Sister   . Heart disease Brother   . Heart disease Brother   . Heart disease Brother   . Heart disease Sister   . Heart disease  Sister     History   Social History  . Marital Status: Divorced    Spouse Name: N/A    Number of Children: 3  . Years of Education: N/A   Occupational History  .      Retired   Social History Main Topics  . Smoking status: Former Smoker -- 20 years    Types: Cigarettes    Quit date: 12/23/2007  . Smokeless tobacco: Not on file  . Alcohol Use: No  . Drug Use: No  . Sexual Activity: Not on file   Other Topics Concern  . Not on file   Social History Narrative  . No narrative on file    Review of systems: The patient specifically denies any chest pain at rest or with exertion, dyspnea at rest (he does have mild dyspnea with exertion), orthopnea, paroxysmal nocturnal dyspnea, syncope, focal neurological deficits, intermittent claudication, lower extremity edema, unexplained weight gain, cough, hemoptysis or wheezing.  The patient also denies abdominal pain, nausea, vomiting, dysphagia, diarrhea, constipation, polyuria, polydipsia, dysuria, hematuria, frequency, urgency, abnormal bleeding or bruising, fever, chills, unexpected weight changes, mood swings, change in skin or hair texture, change in voice quality, auditory or visual problems, allergic reactions or rashes, new musculoskeletal complaints other than usual "aches and pains".   PHYSICAL EXAM BP 140/80  Pulse 60  Resp 16  Ht 5\' 7"  (1.702 m)  Wt 220 lb 11.2 oz (100.109 kg)  BMI 34.56 kg/m2  General: Alert, oriented x3, no distress Head: no evidence of trauma, PERRL, EOMI, no exophtalmos or lid lag, no myxedema, no xanthelasma; normal ears, nose and oropharynx Neck: normal jugular venous pulsations and no hepatojugular reflux; brisk carotid pulses without delay and no carotid bruits Chest: clear to auscultation, no signs of consolidation by percussion or palpation, normal fremitus, symmetrical and full respiratory excursions; healthy left subclavian pacemaker site Cardiovascular: normal position and quality of the  apical impulse, regular rhythm, normal first and second heart sounds, no murmurs, rubs or gallops Abdomen: no tenderness or distention, no masses by palpation, no abnormal pulsatility or arterial bruits, normal bowel sounds, no hepatosplenomegaly Extremities: no clubbing, cyanosis or edema; 2+ radial, ulnar and brachial pulses bilaterally; 2+ right femoral, posterior tibial and dorsalis pedis pulses; 2+ left femoral, posterior tibial and dorsalis pedis pulses; no subclavian or femoral bruits Neurological: grossly nonfocal   EKG: Atrial paced ventricular sensed otherwise normal  Lipid Panel  No  results found for this basename: chol, trig, hdl, cholhdl, vldl, ldlcalc    BMET    Component Value Date/Time   NA 141 04/27/2009 0434   K 3.6 04/27/2009 0434   CL 114* 04/27/2009 0434   CO2 19 04/27/2009 0434   GLUCOSE 92 04/27/2009 0434   BUN 12 04/27/2009 0434   CREATININE 1.38* 04/27/2009 0434   CALCIUM 8.9 04/27/2009 0434   GFRNONAA 37* 04/27/2009 0434   GFRAA  Value: 45        The eGFR has been calculated using the MDRD equation. This calculation has not been validated in all clinical situations. eGFR's persistently <60 mL/min signify possible Chronic Kidney Disease.* 04/27/2009 0434     ASSESSMENT AND PLAN Paroxysmal atrial fibrillation Episodes are only mildly symptomatic, manifesting as palpitations, often noticed at night. Her overall burden of atrial fibrillation is less than 1%. Ventricular rate control is good during the spells of atrial fibrillation. She is on appropriate anticoagulation with warfarin and has remarkably consistent anticoagulation levels. Antiarrhythmic therapy does not appear to be justified. Continue verapamil for rate control.  Pacemaker - dual chamber St. Jude RF device, implanted May 2010 Normal device checked by remote monitoring last month. Lead parameters are excellent. Battery voltage suggests over 6 years longevity. Rare atrial fibrillation with controlled ventricular rates.  Instead of leaving her remote monitoring device plugged all the time, she prefers to perform downloads once every 3 months manually. This is adequate for device monitoring, but I told her it would be preferable to take advantage of the continuous monitoring as we may get earlier warning of rhythm abnormalities or device malfunction.  HTN (hypertension) Well-controlled  CKD (chronic kidney disease) stage 2, GFR 60-89 ml/min Recent labs are not available at the time of his visit  Obesity (BMI 30.0-34.9) She has actually lost about 9 pounds since last visit and is no longer in the severely obese range. She expresses frustration with the fact that she eats very little but has not lost more weight.  Orders Placed This Encounter  Procedures  . EKG 12-Lead   Meds ordered this encounter  Medications  . potassium chloride (K-DUR) 10 MEQ tablet    Sig: Take 10 mEq by mouth daily.    Junious Silk, MD, [facility] [phone] office [phone] pager

## 2013-08-30 NOTE — Patient Instructions (Signed)
Your physician recommends that you schedule a follow-up appointment in: 12 months. Please expect to receive notification regarding your pacemaker downloads every 3 months. The next one will be due in mid December. Your remote monitoring device is designed to provide automatic downloads every 3 months, as long as it is plugged in and activated.

## 2013-08-30 NOTE — Assessment & Plan Note (Signed)
Recent labs are not available at the time of his visit

## 2013-08-30 NOTE — Assessment & Plan Note (Signed)
She has actually lost about 9 pounds since last visit and is no longer in the severely obese range. She expresses frustration with the fact that she eats very little but has not lost more weight.

## 2013-08-30 NOTE — Assessment & Plan Note (Signed)
Normal device checked by remote monitoring last month. Lead parameters are excellent. Battery voltage suggests over 6 years longevity. Rare atrial fibrillation with controlled ventricular rates. Instead of leaving her remote monitoring device plugged all the time, she prefers to perform downloads once every 3 months manually. This is adequate for device monitoring, but I told her it would be preferable to take advantage of the continuous monitoring as we may get earlier warning of rhythm abnormalities or device malfunction.

## 2013-10-04 ENCOUNTER — Ambulatory Visit (INDEPENDENT_AMBULATORY_CARE_PROVIDER_SITE_OTHER): Payer: Medicare Other | Admitting: Pharmacist Clinician (PhC)/ Clinical Pharmacy Specialist

## 2013-10-04 VITALS — BP 130/68 | HR 84

## 2013-10-04 DIAGNOSIS — Z7901 Long term (current) use of anticoagulants: Secondary | ICD-10-CM

## 2013-10-04 DIAGNOSIS — I480 Paroxysmal atrial fibrillation: Secondary | ICD-10-CM

## 2013-10-04 DIAGNOSIS — I4891 Unspecified atrial fibrillation: Secondary | ICD-10-CM

## 2013-10-06 ENCOUNTER — Encounter: Payer: Self-pay | Admitting: Cardiology

## 2013-10-27 ENCOUNTER — Telehealth: Payer: Self-pay | Admitting: Cardiovascular Disease

## 2013-10-27 NOTE — Telephone Encounter (Signed)
Left message that information will be sent to Shakila (device- CMA).

## 2013-10-27 NOTE — Telephone Encounter (Signed)
Supposed to transmit the 3rd of the month (pacemaker)  Machine is outdated and a new one is on the way.  Just wanted Dr C to be aware cannot transmit until she gets new machine.  Should not be long before she received new one.

## 2013-11-08 ENCOUNTER — Ambulatory Visit (INDEPENDENT_AMBULATORY_CARE_PROVIDER_SITE_OTHER): Payer: Medicare Other | Admitting: Pharmacist Clinician (PhC)/ Clinical Pharmacy Specialist

## 2013-11-08 VITALS — BP 126/66 | HR 76

## 2013-11-08 DIAGNOSIS — I4891 Unspecified atrial fibrillation: Secondary | ICD-10-CM

## 2013-11-08 DIAGNOSIS — Z7901 Long term (current) use of anticoagulants: Secondary | ICD-10-CM

## 2013-11-08 DIAGNOSIS — I480 Paroxysmal atrial fibrillation: Secondary | ICD-10-CM

## 2013-11-08 LAB — POCT INR: INR: 1.9

## 2013-12-20 ENCOUNTER — Ambulatory Visit (INDEPENDENT_AMBULATORY_CARE_PROVIDER_SITE_OTHER): Payer: Medicare Other | Admitting: Pharmacist Clinician (PhC)/ Clinical Pharmacy Specialist

## 2013-12-20 VITALS — BP 140/70 | HR 64

## 2013-12-20 DIAGNOSIS — Z7901 Long term (current) use of anticoagulants: Secondary | ICD-10-CM

## 2013-12-20 DIAGNOSIS — I4891 Unspecified atrial fibrillation: Secondary | ICD-10-CM

## 2013-12-20 DIAGNOSIS — I480 Paroxysmal atrial fibrillation: Secondary | ICD-10-CM

## 2013-12-20 LAB — POCT INR: INR: 2

## 2014-01-31 ENCOUNTER — Ambulatory Visit (INDEPENDENT_AMBULATORY_CARE_PROVIDER_SITE_OTHER): Payer: Medicare Other | Admitting: Pharmacist Clinician (PhC)/ Clinical Pharmacy Specialist

## 2014-01-31 VITALS — BP 150/68 | HR 84

## 2014-01-31 DIAGNOSIS — Z7901 Long term (current) use of anticoagulants: Secondary | ICD-10-CM

## 2014-01-31 DIAGNOSIS — I480 Paroxysmal atrial fibrillation: Secondary | ICD-10-CM

## 2014-01-31 DIAGNOSIS — I4891 Unspecified atrial fibrillation: Secondary | ICD-10-CM

## 2014-01-31 LAB — POCT INR: INR: 1.9

## 2014-03-15 ENCOUNTER — Ambulatory Visit: Payer: Medicare Other | Admitting: Pharmacist Clinician (PhC)/ Clinical Pharmacy Specialist

## 2014-03-15 ENCOUNTER — Ambulatory Visit (INDEPENDENT_AMBULATORY_CARE_PROVIDER_SITE_OTHER): Payer: Medicare Other | Admitting: Pharmacist Clinician (PhC)/ Clinical Pharmacy Specialist

## 2014-03-15 VITALS — BP 130/66 | HR 72

## 2014-03-15 DIAGNOSIS — I4891 Unspecified atrial fibrillation: Secondary | ICD-10-CM

## 2014-03-15 DIAGNOSIS — Z7901 Long term (current) use of anticoagulants: Secondary | ICD-10-CM

## 2014-03-15 DIAGNOSIS — I480 Paroxysmal atrial fibrillation: Secondary | ICD-10-CM

## 2014-03-15 LAB — POCT INR: INR: 2

## 2014-04-22 ENCOUNTER — Encounter: Payer: Self-pay | Admitting: Cardiovascular Disease

## 2014-04-22 ENCOUNTER — Other Ambulatory Visit: Payer: Self-pay | Admitting: Cardiovascular Disease

## 2014-04-24 ENCOUNTER — Ambulatory Visit (INDEPENDENT_AMBULATORY_CARE_PROVIDER_SITE_OTHER): Payer: Medicare Other | Admitting: *Deleted

## 2014-04-24 DIAGNOSIS — I495 Sick sinus syndrome: Secondary | ICD-10-CM

## 2014-04-26 ENCOUNTER — Ambulatory Visit (INDEPENDENT_AMBULATORY_CARE_PROVIDER_SITE_OTHER): Payer: Medicare Other | Admitting: Pharmacist Clinician (PhC)/ Clinical Pharmacy Specialist

## 2014-04-26 DIAGNOSIS — I4891 Unspecified atrial fibrillation: Secondary | ICD-10-CM

## 2014-04-26 DIAGNOSIS — I480 Paroxysmal atrial fibrillation: Secondary | ICD-10-CM

## 2014-04-26 DIAGNOSIS — Z7901 Long term (current) use of anticoagulants: Secondary | ICD-10-CM

## 2014-04-26 LAB — POCT INR: INR: 1.6

## 2014-04-29 LAB — MDC_IDC_ENUM_SESS_TYPE_REMOTE
Battery Remaining Longevity: 84 mo
Battery Voltage: 2.93 V
Brady Statistic AP VP Percent: 1 %
Brady Statistic AS VP Percent: 1 %
Brady Statistic RA Percent Paced: 81 %
Brady Statistic RV Percent Paced: 1 %
Implantable Pulse Generator Model: 2210
Implantable Pulse Generator Serial Number: 2261146
Lead Channel Impedance Value: 340 Ohm
Lead Channel Pacing Threshold Amplitude: 0.625 V
Lead Channel Pacing Threshold Amplitude: 1.375 V
Lead Channel Pacing Threshold Pulse Width: 0.4 ms
Lead Channel Setting Pacing Amplitude: 1.625
Lead Channel Setting Pacing Pulse Width: 0.4 ms
MDC IDC MSMT LEADCHNL RA SENSING INTR AMPL: 5 mV
MDC IDC MSMT LEADCHNL RV IMPEDANCE VALUE: 510 Ohm
MDC IDC MSMT LEADCHNL RV PACING THRESHOLD PULSEWIDTH: 0.4 ms
MDC IDC MSMT LEADCHNL RV SENSING INTR AMPL: 12 mV
MDC IDC SESS DTM: 20150502074813
MDC IDC SET LEADCHNL RV PACING AMPLITUDE: 1.625
MDC IDC SET LEADCHNL RV SENSING SENSITIVITY: 2 mV
MDC IDC STAT BRADY AP VS PERCENT: 81 %
MDC IDC STAT BRADY AS VS PERCENT: 18 %

## 2014-05-04 ENCOUNTER — Encounter: Payer: Self-pay | Admitting: Cardiology

## 2014-05-05 NOTE — Progress Notes (Signed)
Remote pacemaker transmission.   

## 2014-05-19 ENCOUNTER — Ambulatory Visit (INDEPENDENT_AMBULATORY_CARE_PROVIDER_SITE_OTHER): Payer: Medicare Other | Admitting: Pharmacist Clinician (PhC)/ Clinical Pharmacy Specialist

## 2014-05-19 DIAGNOSIS — Z7901 Long term (current) use of anticoagulants: Secondary | ICD-10-CM

## 2014-05-19 DIAGNOSIS — I4891 Unspecified atrial fibrillation: Secondary | ICD-10-CM

## 2014-05-19 DIAGNOSIS — I480 Paroxysmal atrial fibrillation: Secondary | ICD-10-CM

## 2014-05-19 LAB — POCT INR: INR: 1.5

## 2014-05-19 MED ORDER — WARFARIN SODIUM 5 MG PO TABS: ORAL_TABLET | ORAL | Status: DC

## 2014-05-31 ENCOUNTER — Ambulatory Visit (INDEPENDENT_AMBULATORY_CARE_PROVIDER_SITE_OTHER): Payer: Medicare Other | Admitting: Pharmacist Clinician (PhC)/ Clinical Pharmacy Specialist

## 2014-05-31 VITALS — BP 124/68 | HR 60

## 2014-05-31 DIAGNOSIS — Z7901 Long term (current) use of anticoagulants: Secondary | ICD-10-CM

## 2014-05-31 DIAGNOSIS — I4891 Unspecified atrial fibrillation: Secondary | ICD-10-CM

## 2014-05-31 DIAGNOSIS — I480 Paroxysmal atrial fibrillation: Secondary | ICD-10-CM

## 2014-05-31 LAB — POCT INR: INR: 2.1

## 2014-06-01 ENCOUNTER — Telehealth: Payer: Self-pay | Admitting: Pharmacist Clinician (PhC)/ Clinical Pharmacy Specialist

## 2014-06-01 NOTE — Telephone Encounter (Signed)
LMOM for pt to call scheduling for next available with PA/NP

## 2014-06-01 NOTE — Telephone Encounter (Signed)
Message copied by Rockne Menghini on Thu Jun 01, 2014  3:18 PM ------      Message from: Sanda Klein      Created: Thu Jun 01, 2014 12:22 PM       No real opportunity to improve how she feels by reprogramming device. She should be seen in the office, please. First available MD or APP      MCr      ----- Message -----         From: Tommy Medal, RPH-CPP         Sent: 05/31/2014  12:32 PM           To: Sanda Klein, MD, Shiela Mayer, CMA            Tobin Chad,            Ms. Bozza and her daughter are concerned about some progressive weakness/fatigue and increase in chest pain. She used nitro x 2 yesterday and once about 2 weeks ago.  They say she is feeling similar to back before the pacemaker was put in.  She did her last download about 2 weeks ago.  Could you review it to see if there are any concerns?  They would like dr. C to review and see if maybe it needs to be "tweaked".              Erasmo Downer       ------

## 2014-06-02 ENCOUNTER — Telehealth: Payer: Self-pay | Admitting: *Deleted

## 2014-06-02 NOTE — Telephone Encounter (Signed)
Spoke to patient regarding CP/SOB x 1 month. Patient states that she has had to take NTG for the pain. Patient will follow up with Sandria Senter on 6-16. Advised patient to call EMS if CP occurs again for any duration of time. Patient voiced understanding.

## 2014-06-06 ENCOUNTER — Ambulatory Visit (INDEPENDENT_AMBULATORY_CARE_PROVIDER_SITE_OTHER): Payer: Medicare Other | Admitting: *Deleted

## 2014-06-06 ENCOUNTER — Ambulatory Visit (INDEPENDENT_AMBULATORY_CARE_PROVIDER_SITE_OTHER): Payer: Medicare Other | Admitting: Cardiology

## 2014-06-06 ENCOUNTER — Encounter: Payer: Self-pay | Admitting: Cardiology

## 2014-06-06 ENCOUNTER — Encounter (HOSPITAL_COMMUNITY): Payer: Self-pay | Admitting: *Deleted

## 2014-06-06 ENCOUNTER — Ambulatory Visit
Admission: RE | Admit: 2014-06-06 | Discharge: 2014-06-06 | Disposition: A | Discharge status: Home / Self Care | Payer: Medicare Other | Source: Ambulatory Visit | Attending: Cardiology | Admitting: Cardiology

## 2014-06-06 VITALS — BP 124/66 | Ht 66.0 in | Wt 219.8 lb

## 2014-06-06 DIAGNOSIS — I209 Angina pectoris, unspecified: Secondary | ICD-10-CM

## 2014-06-06 DIAGNOSIS — R5383 Other fatigue: Secondary | ICD-10-CM

## 2014-06-06 DIAGNOSIS — R079 Chest pain, unspecified: Secondary | ICD-10-CM

## 2014-06-06 DIAGNOSIS — R0602 Shortness of breath: Secondary | ICD-10-CM

## 2014-06-06 DIAGNOSIS — Z79899 Other long term (current) drug therapy: Secondary | ICD-10-CM

## 2014-06-06 DIAGNOSIS — M109 Gout, unspecified: Secondary | ICD-10-CM

## 2014-06-06 DIAGNOSIS — Z7901 Long term (current) use of anticoagulants: Secondary | ICD-10-CM

## 2014-06-06 DIAGNOSIS — I4891 Unspecified atrial fibrillation: Secondary | ICD-10-CM

## 2014-06-06 DIAGNOSIS — Z950 Presence of cardiac pacemaker: Secondary | ICD-10-CM

## 2014-06-06 DIAGNOSIS — I2510 Atherosclerotic heart disease of native coronary artery without angina pectoris: Secondary | ICD-10-CM

## 2014-06-06 DIAGNOSIS — R5381 Other malaise: Secondary | ICD-10-CM

## 2014-06-06 LAB — MDC_IDC_ENUM_SESS_TYPE_INCLINIC
Brady Statistic RA Percent Paced: 81 %
Date Time Interrogation Session: 20150616142025
Implantable Pulse Generator Model: 2210
Implantable Pulse Generator Serial Number: 2261146
Lead Channel Impedance Value: 562.5 Ohm
Lead Channel Pacing Threshold Amplitude: 0.5 V
Lead Channel Pacing Threshold Amplitude: 1.375 V
Lead Channel Pacing Threshold Pulse Width: 0.4 ms
Lead Channel Pacing Threshold Pulse Width: 0.4 ms
Lead Channel Sensing Intrinsic Amplitude: 12 mV
Lead Channel Setting Pacing Amplitude: 1.5 V
Lead Channel Setting Pacing Pulse Width: 0.4 ms
Lead Channel Setting Sensing Sensitivity: 2 mV
MDC IDC MSMT BATTERY REMAINING LONGEVITY: 103.2 mo
MDC IDC MSMT BATTERY VOLTAGE: 2.93 V
MDC IDC MSMT LEADCHNL RA IMPEDANCE VALUE: 337.5 Ohm
MDC IDC MSMT LEADCHNL RA SENSING INTR AMPL: 5 mV
MDC IDC SET LEADCHNL RV PACING AMPLITUDE: 1.625
MDC IDC STAT BRADY RV PERCENT PACED: 0.87 %

## 2014-06-06 LAB — BASIC METABOLIC PANEL
BUN: 38 mg/dL — AB (ref 6–23)
CHLORIDE: 104 meq/L (ref 96–112)
CO2: 25 mEq/L (ref 19–32)
Calcium: 10.1 mg/dL (ref 8.4–10.5)
Creat: 1.35 mg/dL — ABNORMAL HIGH (ref 0.50–1.10)
Glucose, Bld: 77 mg/dL (ref 70–99)
POTASSIUM: 3.1 meq/L — AB (ref 3.5–5.3)
Sodium: 141 mEq/L (ref 135–145)

## 2014-06-06 LAB — CBC
HCT: 35.2 % — ABNORMAL LOW (ref 36.0–46.0)
Hemoglobin: 11 g/dL — ABNORMAL LOW (ref 12.0–15.0)
MCH: 22.4 pg — ABNORMAL LOW (ref 26.0–34.0)
MCHC: 31.3 g/dL (ref 30.0–36.0)
MCV: 71.7 fL — ABNORMAL LOW (ref 78.0–100.0)
PLATELETS: 225 10*3/uL (ref 150–400)
RBC: 4.91 MIL/uL (ref 3.87–5.11)
RDW: 17.8 % — ABNORMAL HIGH (ref 11.5–15.5)
WBC: 7.1 10*3/uL (ref 4.0–10.5)

## 2014-06-06 LAB — URIC ACID: URIC ACID, SERUM: 8.5 mg/dL — AB (ref 2.4–7.0)

## 2014-06-06 MED ORDER — COLCHICINE 0.6 MG PO TABS: 0.6000 mg | ORAL_TABLET | ORAL | Status: DC

## 2014-06-06 NOTE — Assessment & Plan Note (Signed)
3 episodes of angina now over the last 3 months relief with nitroglycerin with her known history of angioplasty in 1995 and nonobstructive disease in 2011 oh cardiac catheterization plan will be to proceed with LexiScan Myoview to evaluate for ischemic origin.  Her shortness of breath has also increased over this time period this all started 3 months ago with bronchitis and she has not felt well since that time we'll check a chest x-ray as well to ensure there is no underlying smoldering pneumonia.

## 2014-06-06 NOTE — Assessment & Plan Note (Signed)
I have added colchicine 0.6 mg one 3 times today and then to decrease to twice a day then once daily

## 2014-06-06 NOTE — Assessment & Plan Note (Signed)
History of angioplasty in 1995, left calf 2011 with nonobstructive disease in all 3 vessels a 50%. We'll proceed with LexiScan my view as patient would not be able to walk long enough on a treadmill.

## 2014-06-06 NOTE — Assessment & Plan Note (Signed)
Followed by our office.

## 2014-06-06 NOTE — Assessment & Plan Note (Signed)
Pacemaker was interrogated no acute problems less than 1% episodes of atrial fib not occurring when she would have for chest pain.

## 2014-06-06 NOTE — Progress Notes (Signed)
06/06/2014   PCP: Salena Saner., MD   Chief Complaint  Patient presents with  . Chest Pain    been having a lot of chest pressure/discomfort over past couple months. fatigue, dyspnea and lightheaded when she gets the chest discomfort.     Primary Cardiologist:Dr. Bertrum Sol   HPI:  80 year old female patient previously Dr. Ky Barban patient now Dr. Victorino December pt.  she has a history of coronary artery disease with an angioplasty in 1995. Her last cardiac catheterization was with Dr. Elisabeth Cara in 2011 revealing EF of 65%; 50% RCA stenosis mid vessel 50% proximal circumflex 50% LAD in 2 sites.  She had St. Jude pacemaker secondary to paroxysmal atrial fibrillation who be a few episodes since placement of the pacemaker.  She has mild left ventricular hypertrophy and mild diastolic dysfunction.  Today she presents with increasing episodes of chest pain over the last 3 months. Each time she's had the discomfort she take a nitroglycerin and the pain immediately resolves. Along with her chest pain which is described as a pressure midsternal she does have shortness of breath but no diaphoresis. When she takes a nitroglycerin she also becomes a little disoriented and dizzy most likely secondary to drop in her blood pressure.  Her shortness of breath which is chronic also has increased over the last 3 months. All of this started when she had bronchitis type problem 3 months ago and she has not gotten her energy back since then and then she has the chest pain.   Her other complaint today is gout of the right elbow.  She is on albuterol but the elbow is swollen and red and painful.  Allergies  Allergen Reactions  . Dilantin [Phenytoin Sodium Extended] Hives  . Phenobarbital Hives    Current Outpatient Prescriptions  Medication Sig Dispense Refill  . allopurinol (ZYLOPRIM) 100 MG tablet Take 100 mg by mouth daily.      [name] aspirin 81 MG tablet Take 81 mg by mouth daily.      .  hydrochlorothiazide (HYDRODIURIL) 25 MG tablet Take 25 mg by mouth daily.      . Multiple Vitamins-Minerals (MULTIVITAMIN PO) Take by mouth daily.      . nitroGLYCERIN (NITROLINGUAL) 0.4 MG/SPRAY spray Place 1 spray under the tongue every 5 (five) minutes as needed for chest pain.      [name] omeprazole (PRILOSEC) 20 MG capsule Take 20 mg by mouth daily.      . potassium chloride (K-DUR) 10 MEQ tablet Take 10 mEq by mouth daily.      . pravastatin (PRAVACHOL) 20 MG tablet Take 20 mg by mouth at bedtime.      . topiramate (TOPAMAX) 50 MG tablet Take 50 mg by mouth 2 (two) times daily.      . verapamil (CALAN-SR) 120 MG CR tablet Take 120 mg by mouth at bedtime.      [name] warfarin (COUMADIN) 5 MG tablet Take 1 tablet by mouth daily or as directed  90 tablet  1  . colchicine 0.6 MG tablet Take 1 tablet (0.6 mg total) by mouth as directed.  35 tablet  0   No current facility-administered medications for this visit.    Past Medical History  Diagnosis Date  . Seizure 2006  . Chest pain     2D Echo 04/21/08 EF = >55%. Moderate tricuspid regurgitation. Persantine Myovoew stress test 04/21/08 EF = 81%, no evidence of ischemia noted.  . Coronary artery  disease   . Dyslipidemia   . Chronic edema     Mild LE edema  . Severe obesity   . Chronic anticoagulation     PAF  . SOB (shortness of breath) on exertion     Class II. Catheterization 04/30/10 showed mild CAD with mildly elevated right heart pressures.   . Arthritis   . Back pain     Secondary to arthritis  . Mild depression   . Brain tumor     On right side. Removed in 1989  . Vertigo   . GERD (gastroesophageal reflux disease)   . Chronic kidney disease     Mild, stage 2  . ACL (anterior cruciate ligament) rupture     Left  . Meningioma     Resection in 1998  . Ovarian cyst     Benign. Had a salpiingoophorectomy in 2005.  [name] Hyperlipidemia     On pravastatin  . PAF (paroxysmal atrial fibrillation)     On coumadin. St. Jude PPM (serial  [id number]) inserted 2010  . Paroxysmal atrial flutter   . Sinus node dysfunction     S/P implantation of a dual-chamber permanent pacemaker in 2010  . Tachycardia-bradycardia syndrome     S/P implantation of dual-chamber permanent pacemaker in 2010  . Sick sinus syndrome   . Bruit     Duplex doppler 03/28/05 Mildly abnormal. *Right subclavian artery: Less than 50% diameter reduction.  . Acute MI   . Coronary atherosclerosis     Minimal    Past Surgical History  Procedure Laterality Date  . Brain tumor removal  1989  . Craniotomy  1989    (Brain tumor) With gamma knife procedure as well.  . Pacemaker insertion  04/27/09    Implanted by Dr. Elisabeth Cara. [place] (serial [id number])  . Cardiac catheterization  04/30/10    Showed mild CAD with mildly elevated right heart pressures. (Dr. Elisabeth Cara)    XTA:VWPVXYI:+ cold /bronchitis 3 months ago, no fevers, no weight changes Skin:no rashes or ulcers HEENT:no blurred vision, no congestion CV:see HPI PUL:see HPI GI:no diarrhea constipation or melena, no indigestion, decreased appetite not eating very well  GU:no hematuria, no dysuria MS:no joint pain, no claudication, rt elbow red and warm, gout Neuro:no syncope, no lightheadedness Endo:no diabetes, no thyroid disease  Wt Readings from Last 3 Encounters:  06/06/14 219 lb 12.8 oz (99.701 kg)  08/30/13 220 lb 11.2 oz (100.109 kg)    PHYSICAL EXAM BP 124/66  Ht 5\' 6"  (1.676 m)  Wt 219 lb 12.8 oz (99.701 kg)  BMI 35.49 kg/m2 General:Pleasant affect, NAD Skin:Warm and dry, brisk capillary refill HEENT:normocephalic, sclera clear, mucus membranes moist Neck:supple, no JVD, no bruits  Heart:S1S2 RRR without murmur, gallup, rub or click Lungs:clear without rales, rhonchi, or wheezes AXK:PVVZ, non tender, + BS, do not palpate liver spleen or masses Ext:no lower ext edema, 2+ pedal pulses, 2+ radial pulses Neuro:alert and oriented X 3, MAE, follows commands, + facial  symmetry  EKG:SR with atrial pacing no acte changes otherwise.  Pacemaker was interrogated at less than 1% occurrences atrial fibrillation similar to her evaluation in September. With activity or her heart rate does increase to 95 beats per minute sinus rhythm.  ASSESSMENT AND PLAN Angina, class III 3 episodes of angina now over the last 3 months relief with nitroglycerin with her known history of angioplasty in 1995 and nonobstructive disease in 2011 oh cardiac catheterization plan will be to proceed with LexiScan Myoview to evaluate  for ischemic origin.  Her shortness of breath has also increased over this time period this all started 3 months ago with bronchitis and she has not felt well since that time we'll check a chest x-ray as well to ensure there is no underlying smoldering pneumonia.    CAD (coronary artery disease) History of angioplasty in 1995, left calf 2011 with nonobstructive disease in all 3 vessels a 50%. We'll proceed with LexiScan my view as patient would not be able to walk long enough on a treadmill.  Gout flare of right elbow I have added colchicine 0.6 mg one 3 times today and then to decrease to twice a day then once daily  Pacemaker - dual chamber St. Jude RF device, implanted May 2010 Pacemaker was interrogated no acute problems less than 1% episodes of atrial fib not occurring when she would have for chest pain.  Long term (current) use of anticoagulants Followed by our office.   I will have her followup with me in 3 weeks once the testing is complete on a day that Dr. Harrell Gave is in the office to further evaluate her condition.  Certainly if she develops acute problems prior that time she should go to the emergency room.  Also check lab values to mixture her kidney function is stable and evaluate uric acid level.

## 2014-06-06 NOTE — Patient Instructions (Addendum)
Your physician has requested that you have an echocardiogram. Echocardiography is a painless test that uses sound waves to create images of your heart. It provides your doctor with information about the size and shape of your heart and how well your heart's chambers and valves are working. This procedure takes approximately one hour. There are no restrictions for this procedure.  Your physician has requested that you have a lexiscan myoview. For further information please visit [email or web address]. Please follow instruction sheet, as given.  [name] has ordered a chest x-ray to be done at [facility] ([address])  [name] has ordered lab work to be done TODAY - you can do the lab and chest x-ray today if you like (building above) There is also a [facility] lab on floor 1 of this building.   [name] prescribed colchicine.  - take 1 tablet every 2 hours x 3 doses TODAY - take 1 tablet twice daily for 1 week (starting tomorrow Wednesday 6/17) - take 1 tablet daily for 1 week (starting Wednesday 6/24) then STOP unless gout is still present  Your physician recommends that you schedule a follow-up appointment in: 3 weeks with [name] Baas, NP (on a day that Dr. Sallyanne Kuster is in the office)

## 2014-06-09 ENCOUNTER — Encounter: Payer: Self-pay | Admitting: *Deleted

## 2014-06-09 ENCOUNTER — Telehealth: Payer: Self-pay | Admitting: *Deleted

## 2014-06-09 DIAGNOSIS — R0600 Dyspnea, unspecified: Secondary | ICD-10-CM

## 2014-06-09 DIAGNOSIS — R5383 Other fatigue: Secondary | ICD-10-CM

## 2014-06-09 DIAGNOSIS — D509 Iron deficiency anemia, unspecified: Secondary | ICD-10-CM

## 2014-06-09 DIAGNOSIS — Z139 Encounter for screening, unspecified: Secondary | ICD-10-CM

## 2014-06-09 DIAGNOSIS — D649 Anemia, unspecified: Secondary | ICD-10-CM

## 2014-06-09 DIAGNOSIS — Z79899 Other long term (current) drug therapy: Secondary | ICD-10-CM

## 2014-06-09 DIAGNOSIS — Z130 Encounter for screening for diseases of the blood and blood-forming organs and certain disorders involving the immune mechanism: Secondary | ICD-10-CM

## 2014-06-09 MED ORDER — POTASSIUM CHLORIDE ER 20 MEQ PO TBCR: 20.0000 meq | EXTENDED_RELEASE_TABLET | Freq: Every day | ORAL | Status: DC

## 2014-06-09 NOTE — Telephone Encounter (Signed)
Patient notified of lab results. Informed of K+ med dose increase and need for repeat labs to check potassium and anemia panel. Patient wrote down instructions and will have blood work on Wednesday June 24.   Med and labs ordered.

## 2014-06-09 NOTE — Progress Notes (Signed)
This encounter was created in error - please disregard.

## 2014-06-09 NOTE — Telephone Encounter (Signed)
Message copied by Fidel Levy on Fri Jun 09, 2014  8:44 AM ------      Message from: Isaiah Serge      Created: Thu Jun 08, 2014 12:26 PM       Please let pt know her Hgb is mildly low, will need to check anemia panel and her K+ is low, take 40 meq of K+ today then 20 meq daily.  Repeat BMP and do anemia panel next Tuesday.  thanks ------

## 2014-06-15 ENCOUNTER — Telehealth (HOSPITAL_COMMUNITY): Payer: Self-pay

## 2014-06-15 LAB — IRON AND TIBC
%SAT: 19 % — AB (ref 20–55)
IRON: 80 ug/dL (ref 42–145)
TIBC: 422 ug/dL (ref 250–470)
UIBC: 342 ug/dL (ref 125–400)

## 2014-06-15 LAB — BASIC METABOLIC PANEL
BUN: 42 mg/dL — AB (ref 6–23)
CHLORIDE: 105 meq/L (ref 96–112)
CO2: 21 mEq/L (ref 19–32)
Calcium: 10.3 mg/dL (ref 8.4–10.5)
Creat: 1.74 mg/dL — ABNORMAL HIGH (ref 0.50–1.10)
GLUCOSE: 83 mg/dL (ref 70–99)
Potassium: 4.1 mEq/L (ref 3.5–5.3)
SODIUM: 139 meq/L (ref 135–145)

## 2014-06-15 LAB — FERRITIN: Ferritin: 19 ng/mL (ref 10–291)

## 2014-06-16 NOTE — Progress Notes (Signed)
Pacemaker check in clinic with Cecilie Kicks, NP for recent CP/DOE. Normal device function. Thresholds, sensing, impedances consistent with previous measurements. Device programmed to maximize longevity. 2,331 mode switch episodes (<1%)---max dur. 51 mins, Max A 640, Max V 169 + Warfarin. 53 high ventricular rates noted---AF w/RVR. Device programmed at appropriate safety margins. Histogram distribution appropriate for patient activity level. Device programmed to optimize intrinsic conduction. Estimated longevity 8.3-8.6 years. Patient will follow up with [facility] in September 2015.

## 2014-06-20 ENCOUNTER — Telehealth: Payer: Self-pay | Admitting: *Deleted

## 2014-06-20 ENCOUNTER — Ambulatory Visit (HOSPITAL_BASED_OUTPATIENT_CLINIC_OR_DEPARTMENT_OTHER)
Admission: RE | Admit: 2014-06-20 | Discharge: 2014-06-20 | Disposition: A | Discharge status: Home / Self Care | Payer: Medicare Other | Source: Ambulatory Visit | Attending: Cardiology | Admitting: Cardiology

## 2014-06-20 ENCOUNTER — Ambulatory Visit (HOSPITAL_COMMUNITY)
Admission: RE | Admit: 2014-06-20 | Discharge: 2014-06-20 | Disposition: A | Discharge status: Home / Self Care | Payer: Medicare Other | Source: Ambulatory Visit | Attending: Cardiology | Admitting: Cardiology

## 2014-06-20 VITALS — Ht 66.0 in | Wt 219.0 lb

## 2014-06-20 DIAGNOSIS — R5381 Other malaise: Secondary | ICD-10-CM | POA: Insufficient documentation

## 2014-06-20 DIAGNOSIS — I739 Peripheral vascular disease, unspecified: Secondary | ICD-10-CM | POA: Insufficient documentation

## 2014-06-20 DIAGNOSIS — R5383 Other fatigue: Secondary | ICD-10-CM

## 2014-06-20 DIAGNOSIS — I10 Essential (primary) hypertension: Secondary | ICD-10-CM | POA: Insufficient documentation

## 2014-06-20 DIAGNOSIS — Z8249 Family history of ischemic heart disease and other diseases of the circulatory system: Secondary | ICD-10-CM | POA: Insufficient documentation

## 2014-06-20 DIAGNOSIS — I159 Secondary hypertension, unspecified: Secondary | ICD-10-CM

## 2014-06-20 DIAGNOSIS — R079 Chest pain, unspecified: Secondary | ICD-10-CM

## 2014-06-20 DIAGNOSIS — E669 Obesity, unspecified: Secondary | ICD-10-CM | POA: Insufficient documentation

## 2014-06-20 DIAGNOSIS — I369 Nonrheumatic tricuspid valve disorder, unspecified: Secondary | ICD-10-CM

## 2014-06-20 DIAGNOSIS — R0602 Shortness of breath: Secondary | ICD-10-CM

## 2014-06-20 DIAGNOSIS — R42 Dizziness and giddiness: Secondary | ICD-10-CM | POA: Insufficient documentation

## 2014-06-20 DIAGNOSIS — I252 Old myocardial infarction: Secondary | ICD-10-CM | POA: Insufficient documentation

## 2014-06-20 DIAGNOSIS — I4891 Unspecified atrial fibrillation: Secondary | ICD-10-CM | POA: Insufficient documentation

## 2014-06-20 DIAGNOSIS — I2510 Atherosclerotic heart disease of native coronary artery without angina pectoris: Secondary | ICD-10-CM | POA: Insufficient documentation

## 2014-06-20 MED ORDER — REGADENOSON 0.4 MG/5ML IV SOLN
0.4000 mg | Freq: Once | INTRAVENOUS | Status: AC
  Administered 2014-06-20: 0.4 mg via INTRAVENOUS

## 2014-06-20 MED ORDER — TECHNETIUM TC 99M SESTAMIBI GENERIC - CARDIOLITE
10.0000 | Freq: Once | INTRAVENOUS | Status: AC | PRN
  Administered 2014-06-20: 10 via INTRAVENOUS

## 2014-06-20 MED ORDER — AMINOPHYLLINE 25 MG/ML IV SOLN
75.0000 mg | Freq: Once | INTRAVENOUS | Status: AC
  Administered 2014-06-20: 75 mg via INTRAVENOUS

## 2014-06-20 MED ORDER — TECHNETIUM TC 99M SESTAMIBI GENERIC - CARDIOLITE
30.0000 | Freq: Once | INTRAVENOUS | Status: AC | PRN
  Administered 2014-06-20: 30 via INTRAVENOUS

## 2014-06-20 MED ORDER — FERROUS SULFATE 324 (65 FE) MG PO TBEC: 324.0000 mg | DELAYED_RELEASE_TABLET | Freq: Every day | ORAL | Status: DC

## 2014-06-20 NOTE — Procedures (Addendum)
[facility] [address] [place] [address] [phone]  Cardiology Nuclear Med Study  [address] [name] is a 80 y.o. female     MRN : [id number]     DOB: 08/07/1932  Procedure Date: 06/20/2014  Nuclear Med Background Indication for Stress Test:  Evaluation for Ischemia History:  CAD;MI;PACER;PAF;seizures;Last NUC MPI on 04/21/2008-nonischemic;EF=81% Cardiac Risk Factors: Family History - CAD, Hypertension, Lipids, Obesity and PVD  Symptoms:  Chest Pain, Dizziness, DOE, Fatigue, Light-Headedness and SOB   Nuclear Pre-Procedure Caffeine/Decaff Intake:  9:00pm NPO After: 7:00am   IV Site: R Hand  IV 0.9% NS with Angio Cath:  22g  Chest Size (in):  n/a IV Started by: Rolene Course, RN  Height: 5\' 6"  (1.676 m)  Cup Size: A  BMI:  Body mass index is 35.36 kg/(m^2). Weight:  219 lb (99.338 kg)   Tech Comments:  n/a    Nuclear Med Study 1 or 2 day study: 1 day  Stress Test Type:  [place]  Order Authorizing Provider:  Sanda Klein, MD   Resting Radionuclide: Technetium 53m Sestamibi  Resting Radionuclide Dose: 10.5 mCi   Stress Radionuclide:  Technetium 56m Sestamibi  Stress Radionuclide Dose: 30.8 mCi           Stress Protocol Rest HR:67 Stress HR: 63  Rest BP: 140/85 Stress BP: 149/91  Exercise Time (min): n/a METS: n/a   Predicted Max HR: 141 bpm % Max HR: 44.68 bpm Rate Pressure Product: 9387  Dose of Adenosine (mg):  n/a Dose of Lexiscan: 0.4 mg  Dose of Atropine (mg): n/a Dose of Dobutamine: n/a mcg/kg/min (at max HR)  Stress Test Technologist: Leane Para, CCT Nuclear Technologist: Otho Perl, CNMT   Rest Procedure:  Myocardial perfusion imaging was performed at rest 45 minutes following the intravenous administration of Technetium 33m Sestamibi. Stress Procedure:  The patient received IV Lexiscan 0.4 mg over 15-seconds.  Technetium 68m Sestamibi injected IV at 30-seconds.  Patient experienced  SOB and 75 mg of Aminophylline IV was administered. There were no significant changes with Lexiscan.  Quantitative spect images were obtained after a 45 minute delay.  Transient Ischemic Dilatation (Normal <1.22):  1.15 QGS EDV:  66 ml QGS ESV:  16 ml LV Ejection Fraction: 76%     Rest ECG: NSR - Normal EKG  Stress ECG: No significant change from baseline ECG  QPS Raw Data Images:  Normal; no motion artifact; normal heart/lung ratio. Stress Images:  Normal homogeneous uptake in all areas of the myocardium. Rest Images:  Normal homogeneous uptake in all areas of the myocardium. Subtraction (SDS):  Normal  Impression Exercise Capacity:  Lexiscan with no exercise. BP Response:  Normal blood pressure response. Clinical Symptoms:  Mild shortness of breath ECG Impression:  No significant ST segment change suggestive of ischemia. Comparison with Prior Nuclear Study: No significant change from previous study  Overall Impression:  Normal stress nuclear study.  LV Wall Motion:  NL LV Function, EF 76%; NL Wall Motion   KELLY,THOMAS A, MD  06/21/2014 2:05 PM

## 2014-06-20 NOTE — Telephone Encounter (Signed)
Patient notified of labs. Medication ordered per Mickel Baas, NP

## 2014-06-20 NOTE — Telephone Encounter (Signed)
Message copied by Fidel Levy on Tue Jun 20, 2014  4:47 PM ------      Message from: Isaiah Serge      Created: Sun Jun 18, 2014 12:05 PM       Add ferrous sulfate 324 mg daily to meds. ------

## 2014-06-20 NOTE — Progress Notes (Signed)
2D Echo Performed 06/20/2014    Marygrace Drought, RCS

## 2014-06-21 ENCOUNTER — Ambulatory Visit (INDEPENDENT_AMBULATORY_CARE_PROVIDER_SITE_OTHER): Payer: Medicare Other | Admitting: Pharmacist Clinician (PhC)/ Clinical Pharmacy Specialist

## 2014-06-21 DIAGNOSIS — Z7901 Long term (current) use of anticoagulants: Secondary | ICD-10-CM

## 2014-06-21 DIAGNOSIS — I480 Paroxysmal atrial fibrillation: Secondary | ICD-10-CM

## 2014-06-21 DIAGNOSIS — I4891 Unspecified atrial fibrillation: Secondary | ICD-10-CM

## 2014-06-21 LAB — POCT INR: INR: 1.9

## 2014-06-27 ENCOUNTER — Ambulatory Visit (INDEPENDENT_AMBULATORY_CARE_PROVIDER_SITE_OTHER): Payer: Medicare Other | Admitting: Cardiology

## 2014-06-27 ENCOUNTER — Encounter: Payer: Self-pay | Admitting: Cardiology

## 2014-06-27 VITALS — BP 136/78 | HR 65 | Ht 66.0 in | Wt 218.3 lb

## 2014-06-27 DIAGNOSIS — I25119 Atherosclerotic heart disease of native coronary artery with unspecified angina pectoris: Secondary | ICD-10-CM

## 2014-06-27 DIAGNOSIS — I209 Angina pectoris, unspecified: Secondary | ICD-10-CM

## 2014-06-27 DIAGNOSIS — I2510 Atherosclerotic heart disease of native coronary artery without angina pectoris: Secondary | ICD-10-CM

## 2014-06-27 MED ORDER — POTASSIUM CHLORIDE ER 8 MEQ PO TBCR: 16.0000 meq | EXTENDED_RELEASE_TABLET | Freq: Every day | ORAL | Status: DC

## 2014-06-27 NOTE — Assessment & Plan Note (Signed)
stable °

## 2014-06-27 NOTE — Assessment & Plan Note (Signed)
Normal nuc study

## 2014-06-27 NOTE — Patient Instructions (Signed)
Your physician wants you to follow-up in: September with Dr Jerilynn Mages. Croitoru.  You will receive a reminder letter in the mail two months in advance. If you don't receive a letter, please call our office to schedule the follow-up appointment.   Your prescription for the smaller potassium pills has been sent in.  You will need to take 2 tablets of the 66meq potassium daily.  Please call your pharmacy when you are ready to have this prescription filled.

## 2014-06-27 NOTE — Assessment & Plan Note (Signed)
Improved, skin is peeling -colchicine has relieved the pain.  Now no redness.  Uric acid was elevated.

## 2014-06-27 NOTE — Progress Notes (Signed)
06/27/2014   PCP: Salena Saner., MD   Chief Complaint  Patient presents with  . Follow-up    Echo, Nuc, Labs. C/o nervousness-"shakey feeliing inside" and lightheadedness.    Primary Cardiologist:Dr. Jerilynn Mages. Croitoru   HPI: Back today for results of echo and nuc study, labs and cxr. Nuc without ischemia and EF 76%,   Echo EF 35-32% grade I diastolic dysfunction. Mild to mod. TR.  Reviewed with Dr. Margaretmary Bayley is mention of moderate size irregular density that appears at the junction of the IVC and RA. Dr. Sallyanne Kuster reviewed the echo and felt this was not a problem and he did not see the density, felt it was part of the valve. CXR no PNA, No chf.  Iron slighlty low I added Iron she will follow up with Dr. Karlton Lemon.  Gout has improved no longer erythemic there is peeling of the site from where it had been tightly swollen occasional pain the colchicine did cause some diarrhea so she's not currently taking.  She has no further chest pain no shortness of breath she did complain of a little fluttering in her upper abdomen but it is very brief lasting only seconds maybe once a day.  Allergies  Allergen Reactions  . Dilantin [Phenytoin Sodium Extended] Hives  . Phenobarbital Hives    Current Outpatient Prescriptions  Medication Sig Dispense Refill  . allopurinol (ZYLOPRIM) 100 MG tablet Take 100 mg by mouth daily.      [name] aspirin 81 MG tablet Take 81 mg by mouth daily.      . colchicine 0.6 MG tablet Take 1 tablet (0.6 mg total) by mouth as directed.  35 tablet  0  . ferrous sulfate 324 (65 FE) MG TBEC Take 1 tablet (324 mg total) by mouth daily.  30 tablet  6  . hydrochlorothiazide (HYDRODIURIL) 25 MG tablet Take 25 mg by mouth daily.      . Multiple Vitamins-Minerals (MULTIVITAMIN PO) Take by mouth daily.      . nitroGLYCERIN (NITROLINGUAL) 0.4 MG/SPRAY spray Place 1 spray under the tongue every 5 (five) minutes as needed for chest pain.      [name] omeprazole (PRILOSEC) 20  MG capsule Take 20 mg by mouth daily.      . pravastatin (PRAVACHOL) 20 MG tablet Take 20 mg by mouth at bedtime.      . topiramate (TOPAMAX) 50 MG tablet Take 50 mg by mouth 2 (two) times daily.      . verapamil (CALAN-SR) 120 MG CR tablet Take 120 mg by mouth at bedtime.      [name] warfarin (COUMADIN) 5 MG tablet Take 1 tablet by mouth daily or as directed  90 tablet  1  . [DISCONTINUED] Potassium Chloride ER 20 MEQ TBCR Take 20 mEq by mouth daily.  90 tablet  1   No current facility-administered medications for this visit.    Past Medical History  Diagnosis Date  . Seizure 2006  . Chest pain     2D Echo 04/21/08 EF = >55%. Moderate tricuspid regurgitation. Persantine Myovoew stress test 04/21/08 EF = 81%, no evidence of ischemia noted.  . Coronary artery disease   . Dyslipidemia   . Chronic edema     Mild LE edema  . Severe obesity   . Chronic anticoagulation     PAF  . SOB (shortness of breath) on exertion     Class II. Catheterization 04/30/10 showed mild CAD with mildly elevated right  heart pressures.   . Arthritis   . Back pain     Secondary to arthritis  . Mild depression   . Brain tumor     On right side. Removed in 1989  . Vertigo   . GERD (gastroesophageal reflux disease)   . Chronic kidney disease     Mild, stage 2  . ACL (anterior cruciate ligament) rupture     Left  . Meningioma     Resection in 1998  . Ovarian cyst     Benign. Had a salpiingoophorectomy in 2005.  [name] Hyperlipidemia     On pravastatin  . PAF (paroxysmal atrial fibrillation)     On coumadin. St. Jude PPM (serial [id number]) inserted 2010  . Paroxysmal atrial flutter   . Sinus node dysfunction     S/P implantation of a dual-chamber permanent pacemaker in 2010  . Tachycardia-bradycardia syndrome     S/P implantation of dual-chamber permanent pacemaker in 2010  . Sick sinus syndrome   . Bruit     Duplex doppler 03/28/05 Mildly abnormal. *Right subclavian artery: Less than 50% diameter reduction.  .  Acute MI   . Coronary atherosclerosis     Minimal    Past Surgical History  Procedure Laterality Date  . Brain tumor removal  1989  . Craniotomy  1989    (Brain tumor) With gamma knife procedure as well.  . Pacemaker insertion  04/27/09    Implanted by Dr. Elisabeth Cara. [employer] (serial [id number])  . Cardiac catheterization  04/30/10    Showed mild CAD with mildly elevated right heart pressures. (Dr. Elisabeth Cara)    TGG:YIRSWNI:OE colds or fevers, no weight changes Skin:no rashes or ulcers HEENT:no blurred vision, no congestion CV:see HPI PUL:see HPI GI:no diarrhea + constipation with iron no melena, no indigestion GU:no hematuria, no dysuria MS:no joint pain rt elbow pain improved, no claudication Neuro:no syncope, no lightheadedness Endo:no diabetes, no thyroid disease  Wt Readings from Last 3 Encounters:  06/27/14 218 lb 4.8 oz (99.02 kg)  06/20/14 219 lb (99.338 kg)  06/06/14 219 lb 12.8 oz (99.701 kg)    PHYSICAL EXAM BP 136/78  Pulse 65  Ht 5\' 6"  (1.676 m)  Wt 218 lb 4.8 oz (99.02 kg)  BMI 35.25 kg/m2 General:Pleasant affect, NAD Skin:Warm and dry, brisk capillary refill HEENT:normocephalic, sclera clear, mucus membranes moist Neck:supple, no JVD, no bruits  Heart:S1S2 RRR without murmur, gallup, rub or click Lungs:clear without rales, rhonchi, or wheezes VOJ:JKKX, non tender, + BS, do not palpate liver spleen or masses Ext:no lower ext edema, 2+ pedal pulses, 2+ radial pulses Neuro:alert and oriented, MAE, follows commands, + facial symmetry  FGH:WEXHBZ pacing, v. Sensing, no acute changes otherwise  ASSESSMENT AND PLAN Angina, class III No further pain, nuc study was without ischemia.  CAD (coronary artery disease) Normal nuc study  Gout flare of right elbow Improved, skin is peeling -colchicine has relieved the pain.  Now no redness.  Uric acid was elevated.  CKD (chronic kidney disease) stage 2, GFR 60-89 ml/min stable  Long term (current) use of  anticoagulants stable

## 2014-06-27 NOTE — Assessment & Plan Note (Signed)
No further pain, nuc study was without ischemia.

## 2014-07-06 NOTE — Telephone Encounter (Signed)
Encounter complete. 

## 2014-07-11 ENCOUNTER — Telehealth: Payer: Self-pay | Admitting: Neurology

## 2014-07-13 ENCOUNTER — Encounter: Payer: Self-pay | Admitting: Neurology

## 2014-07-13 ENCOUNTER — Ambulatory Visit (INDEPENDENT_AMBULATORY_CARE_PROVIDER_SITE_OTHER): Payer: Medicare Other | Admitting: Neurology

## 2014-07-13 ENCOUNTER — Telehealth: Payer: Self-pay | Admitting: Cardiology

## 2014-07-13 VITALS — BP 125/79 | HR 88 | Ht 66.0 in | Wt 217.0 lb

## 2014-07-13 DIAGNOSIS — R569 Unspecified convulsions: Secondary | ICD-10-CM

## 2014-07-13 DIAGNOSIS — D329 Benign neoplasm of meninges, unspecified: Secondary | ICD-10-CM

## 2014-07-13 DIAGNOSIS — D320 Benign neoplasm of cerebral meninges: Secondary | ICD-10-CM

## 2014-07-13 DIAGNOSIS — R413 Other amnesia: Secondary | ICD-10-CM

## 2014-07-13 DIAGNOSIS — I209 Angina pectoris, unspecified: Secondary | ICD-10-CM

## 2014-07-13 HISTORY — DX: Other amnesia: R41.3

## 2014-07-13 NOTE — Progress Notes (Signed)
Reason for visit: Seizures  [name] is an 80 y.o. female  History of present illness:  [name] is a 80 year old right-handed black female with history of multiple meningiomas, status post resection and gamma knife therapy. The patient has a history of seizures associated with this. She has been on Topamax taking 50 mg twice daily for a number of years with good control. The patient does report some problems with memory that has been persistent for several years as well. The patient continues to operate a motor vehicle without difficulty. She requires DMV forms to be filled out every 2 years. She returns to this office at this point for this reason. She indicates that her Topamax is being prescribed through her primary care physician at this time. The last CT scan of brain was done in February 2011 and appears to be stable from 4 years prior. The patient does not report any new medical issues that have come up since last seen. She denies any new numbness, weakness, balance problems, headaches, or visual changes. 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 She returns for an evaluation.  Past Medical History  Diagnosis Date  . Seizure 2006  . Chest pain     2D Echo 04/21/08 EF = >55%. Moderate tricuspid regurgitation. Persantine Myovoew stress test 04/21/08 EF = 81%, no evidence of ischemia noted.  . Coronary artery disease   . Dyslipidemia   . Chronic edema     Mild LE edema  . Severe obesity   . Chronic anticoagulation     PAF  . SOB (shortness of breath) on exertion     Class II. Catheterization 04/30/10 showed mild CAD with mildly elevated right heart pressures.   . Arthritis   . Back pain     Secondary to arthritis  . Mild depression   . Brain tumor     On right side. Removed in 1989  . Vertigo   . GERD (gastroesophageal reflux disease)   . Chronic kidney disease     Mild, stage 2  . ACL (anterior  cruciate ligament) rupture     Left  . Meningioma     Resection in 1998  . Ovarian cyst     Benign. Had a salpiingoophorectomy in 2005.  [name] Hyperlipidemia     On pravastatin  . PAF (paroxysmal atrial fibrillation)     On coumadin. St. Jude PPM (serial [id number]) inserted 2010  . Paroxysmal atrial flutter   . Sinus node dysfunction     S/P implantation of a dual-chamber permanent pacemaker in 2010  . Tachycardia-bradycardia syndrome     S/P implantation of dual-chamber permanent pacemaker in 2010  . Sick sinus syndrome   . Bruit     Duplex doppler 03/28/05 Mildly abnormal. *Right subclavian artery: Less than 50% diameter reduction.  . Acute MI   . Coronary atherosclerosis     Minimal  . Memory difficulty 07/13/2014  . Trochlear nerve palsy     Right    Past Surgical History  Procedure Laterality Date  . Brain tumor removal  1989  . Craniotomy  1989    (Brain tumor) With gamma knife procedure as well.  . Pacemaker insertion  04/27/09    Implanted by Dr. Elisabeth Cara. [place] (serial [id number])  . Cardiac catheterization  04/30/10    Showed mild CAD with mildly elevated right heart pressures. (Dr. Elisabeth Cara)  . Knee  surgery      Bilateral, arthroscopic    Family History  Problem Relation Age of Onset  . Stroke Mother   . Heart disease Father   . Heart disease Sister   . Heart disease Brother   . Heart disease Brother   . Heart disease Brother   . Heart disease Sister   . Heart disease Sister     Social history:  reports that she quit smoking about 6 years ago. Her smoking use included Cigarettes. She smoked 0.00 packs per day for 20 years. She does not have any smokeless tobacco history on file. She reports that she does not drink alcohol or use illicit drugs.    Allergies  Allergen Reactions  . Dilantin [Phenytoin Sodium Extended] Hives  . Phenobarbital Hives    Medications:  Current Outpatient Prescriptions on File Prior to Visit  Medication Sig Dispense Refill   . allopurinol (ZYLOPRIM) 100 MG tablet Take 100 mg by mouth daily.      [name] aspirin 81 MG tablet Take 81 mg by mouth daily.      . colchicine 0.6 MG tablet Take 1 tablet (0.6 mg total) by mouth as directed.  35 tablet  0  . ferrous sulfate 324 (65 FE) MG TBEC Take 1 tablet (324 mg total) by mouth daily.  30 tablet  6  . hydrochlorothiazide (HYDRODIURIL) 25 MG tablet Take 25 mg by mouth daily.      . Multiple Vitamins-Minerals (MULTIVITAMIN PO) Take by mouth daily.      . nitroGLYCERIN (NITROLINGUAL) 0.4 MG/SPRAY spray Place 1 spray under the tongue every 5 (five) minutes as needed for chest pain.      [name] omeprazole (PRILOSEC) 20 MG capsule Take 20 mg by mouth daily.      . potassium chloride (KLOR-CON) 8 MEQ tablet Take 2 tablets (16 mEq total) by mouth daily.  180 tablet  3  . pravastatin (PRAVACHOL) 20 MG tablet Take 20 mg by mouth at bedtime.      . topiramate (TOPAMAX) 50 MG tablet Take 50 mg by mouth 2 (two) times daily.      . verapamil (CALAN-SR) 120 MG CR tablet Take 120 mg by mouth at bedtime.      [name] warfarin (COUMADIN) 5 MG tablet Take 1 tablet by mouth daily or as directed  90 tablet  1   No current facility-administered medications on file prior to visit.    ROS:  Out of a complete 14 system review of symptoms, the patient complains only of the following symptoms, and all other reviewed systems are negative.  History of seizures Degenerative arthritis  Blood pressure 125/79, pulse 88, height 5\' 6"  (1.676 m), weight 217 lb (98.431 kg).  Physical Exam  General: The patient is alert and cooperative at the time of the examination. The patient is moderately obese.  Skin: No significant peripheral edema is noted.   Neurologic Exam  Mental status: The patient is oriented x 3. Mini-Mental status examination done today shows a total score of 30/30.  Cranial nerves: Facial symmetry is present. Speech is normal, no aphasia or dysarthria is noted. Extraocular movements are full.  Visual fields are full.  Motor: The patient has good strength in all 4 extremities.  Sensory examination: Soft touch sensation is symmetric on the face, arms, and legs.  Coordination: The patient has good finger-nose-finger and heel-to-shin bilaterally.  Gait and station: The patient has a normal gait. Tandem gait is unsteady. Romberg is negative. No drift  is seen.  Reflexes: Deep tendon reflexes are symmetric, but are depressed.   CT head 01/24/10:  Impression: Abnormal CT head (with and without contrast) demonstrating: 1. 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 2. Right anterior temporal encephalomalacia and overlying craniotomy from prior meningioma resection. 3. In comparison to the prior MRI from 09/17/06, there is no significant interval change.    Assessment/Plan:  One. History of seizures  2. Mild memory disturbance  3. History of multiple meningiomas  The patient will be set up for a CT scan of the brain to followup with the meningioma issue. The last CT scan was done in February 2011. The patient has chronic renal insufficiency, and she should not have dye with the CT. The patient will need to have her DMV form filled out, and she will followup through this office in one year.   Jill Alexanders MD 07/13/2014 8:08 PM  [facility] [address] [place], [place] [address]  Phone [phone] Fax [phone]

## 2014-07-13 NOTE — Telephone Encounter (Signed)
RN spoke to patient. 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 She needs the cardiovascular section completed by CARDIOLOGIST. She also has a FMLA paper for her daughter. RN informed patient the FMLA papers are sent to [facility] to handle.It will take 7-10 days to complete. Patient is aware she will have to fill out medical release form and form charges. She verbalized understanding. RN informed patient she could pick D.O.T FORM on 07/14/14 after 2 pm. She states she will come tomorrow.

## 2014-07-13 NOTE — Patient Instructions (Signed)

## 2014-07-13 NOTE — Telephone Encounter (Signed)
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 Please call and let her know,she wants to drop it off in a few minutes.

## 2014-07-14 ENCOUNTER — Telehealth: Payer: Self-pay | Admitting: Cardiovascular Disease

## 2014-07-14 DIAGNOSIS — Z0289 Encounter for other administrative examinations: Secondary | ICD-10-CM

## 2014-07-14 NOTE — Telephone Encounter (Signed)
Dr Sallyanne Kuster signed DOT-DMV [place]. COMPLETED PATIENT WILL PICK UP TODAY

## 2014-07-17 ENCOUNTER — Encounter: Payer: Self-pay | Admitting: Cardiovascular Disease

## 2014-07-19 ENCOUNTER — Ambulatory Visit (INDEPENDENT_AMBULATORY_CARE_PROVIDER_SITE_OTHER): Payer: Medicare Other | Admitting: Pharmacist Clinician (PhC)/ Clinical Pharmacy Specialist

## 2014-07-19 VITALS — BP 120/76 | HR 72

## 2014-07-19 DIAGNOSIS — I4891 Unspecified atrial fibrillation: Secondary | ICD-10-CM

## 2014-07-19 DIAGNOSIS — Z7901 Long term (current) use of anticoagulants: Secondary | ICD-10-CM

## 2014-07-19 DIAGNOSIS — I480 Paroxysmal atrial fibrillation: Secondary | ICD-10-CM

## 2014-07-19 LAB — POCT INR: INR: 1.8

## 2014-07-19 NOTE — Telephone Encounter (Signed)
07/14/2014  Patient daughter left  FMLA  and DOT forms on her mother at front desk.  Laretta Alstrom brought them to me not knowing that the FMLA form was in there so patient daughter left without signing a release form and paying the 25.00 charge, so I called the patient to let her know she needed to sign a release form and also pay the 25.00 charge before we could start the process patient voiced understood and that she would come in on Monday to take care of that.  cbr  07/19/2014  Came in office today to sign release and pay the 25.00 charge.  I then put bag to send to [employer] at [facility]. cbr

## 2014-07-20 ENCOUNTER — Ambulatory Visit
Admission: RE | Admit: 2014-07-20 | Discharge: 2014-07-20 | Disposition: A | Discharge status: Home / Self Care | Payer: Medicare Other | Source: Ambulatory Visit | Attending: Neurology | Admitting: Neurology

## 2014-07-20 DIAGNOSIS — R569 Unspecified convulsions: Secondary | ICD-10-CM

## 2014-07-20 DIAGNOSIS — D329 Benign neoplasm of meninges, unspecified: Secondary | ICD-10-CM

## 2014-07-20 DIAGNOSIS — R413 Other amnesia: Secondary | ICD-10-CM

## 2014-07-21 ENCOUNTER — Telehealth: Payer: Self-pay | Admitting: Neurology

## 2014-07-21 NOTE — Telephone Encounter (Signed)
  I called the patient. The CT scan of the head shows no change from February 2011, no recurrence of meningioma.  CT head 07/21/2014:  Impression   Abnormal CT scan of the head showing mild changes of chronic  microvascular ischemia and stable changes of right temporal craniotomy  with underlying encephalomalacia. Overall no significant change compared  with CT head dated 01/24/2010

## 2014-07-26 NOTE — Telephone Encounter (Signed)
Received completed FMLA form from [place] @ [employer] 8/5  Given to B.Lassiter for Dr Sallyanne Kuster to sign.  8/5

## 2014-07-31 ENCOUNTER — Telehealth: Payer: Self-pay | Admitting: Cardiovascular Disease

## 2014-07-31 NOTE — Telephone Encounter (Signed)
Called patient to advise that her daughters FMLA paperwork has been completed and signed by Dr Sallyanne Kuster and is ready for pick up. 07/31/14 lp

## 2014-07-31 NOTE — Telephone Encounter (Signed)
Called Ms Cypert and let her know her daughters FMLA papers are completed and signed by Dr Sallyanne Kuster.  Daughter will let us know if she wants picked up or mailed  lp

## 2014-08-09 ENCOUNTER — Ambulatory Visit (INDEPENDENT_AMBULATORY_CARE_PROVIDER_SITE_OTHER): Payer: Medicare Other | Admitting: Pharmacist Clinician (PhC)/ Clinical Pharmacy Specialist

## 2014-08-09 VITALS — BP 146/74 | HR 60

## 2014-08-09 DIAGNOSIS — I4891 Unspecified atrial fibrillation: Secondary | ICD-10-CM

## 2014-08-09 DIAGNOSIS — Z7901 Long term (current) use of anticoagulants: Secondary | ICD-10-CM

## 2014-08-09 DIAGNOSIS — I480 Paroxysmal atrial fibrillation: Secondary | ICD-10-CM

## 2014-08-09 LAB — POCT INR: INR: 2

## 2014-09-12 ENCOUNTER — Ambulatory Visit (INDEPENDENT_AMBULATORY_CARE_PROVIDER_SITE_OTHER): Payer: Medicare Other | Admitting: *Deleted

## 2014-09-12 ENCOUNTER — Encounter: Payer: Self-pay | Admitting: Cardiovascular Disease

## 2014-09-12 ENCOUNTER — Ambulatory Visit (INDEPENDENT_AMBULATORY_CARE_PROVIDER_SITE_OTHER): Payer: Medicare Other | Admitting: Cardiovascular Disease

## 2014-09-12 VITALS — BP 136/70 | HR 84 | Resp 16 | Ht 66.0 in | Wt 219.9 lb

## 2014-09-12 DIAGNOSIS — I4891 Unspecified atrial fibrillation: Secondary | ICD-10-CM

## 2014-09-12 DIAGNOSIS — I10 Essential (primary) hypertension: Secondary | ICD-10-CM

## 2014-09-12 DIAGNOSIS — I495 Sick sinus syndrome: Secondary | ICD-10-CM

## 2014-09-12 DIAGNOSIS — Z7901 Long term (current) use of anticoagulants: Secondary | ICD-10-CM

## 2014-09-12 DIAGNOSIS — I480 Paroxysmal atrial fibrillation: Secondary | ICD-10-CM

## 2014-09-12 DIAGNOSIS — I209 Angina pectoris, unspecified: Secondary | ICD-10-CM

## 2014-09-12 DIAGNOSIS — Z950 Presence of cardiac pacemaker: Secondary | ICD-10-CM

## 2014-09-12 LAB — MDC_IDC_ENUM_SESS_TYPE_INCLINIC
Battery Voltage: 2.92 V
Implantable Pulse Generator Model: 2210
Lead Channel Impedance Value: 487.5 Ohm
Lead Channel Pacing Threshold Pulse Width: 0.4 ms
Lead Channel Sensing Intrinsic Amplitude: 12 mV
Lead Channel Sensing Intrinsic Amplitude: 5 mV
Lead Channel Setting Pacing Amplitude: 1.625
Lead Channel Setting Pacing Amplitude: 1.625
Lead Channel Setting Pacing Pulse Width: 0.4 ms
Lead Channel Setting Sensing Sensitivity: 2 mV
MDC IDC MSMT BATTERY REMAINING LONGEVITY: 92.4 mo
MDC IDC MSMT LEADCHNL RA IMPEDANCE VALUE: 337.5 Ohm
MDC IDC MSMT LEADCHNL RA PACING THRESHOLD AMPLITUDE: 0.625 V
MDC IDC MSMT LEADCHNL RA PACING THRESHOLD PULSEWIDTH: 0.4 ms
MDC IDC MSMT LEADCHNL RV PACING THRESHOLD AMPLITUDE: 1.375 V
MDC IDC PG SERIAL: 2261146
MDC IDC SESS DTM: 20150922130515
MDC IDC STAT BRADY RA PERCENT PACED: 85 %
MDC IDC STAT BRADY RV PERCENT PACED: 1.6 %

## 2014-09-12 LAB — POCT INR: INR: 2.3

## 2014-09-12 MED ORDER — NITROGLYCERIN 0.4 MG/SPRAY TL SOLN: 1.0000 | Status: DC | PRN

## 2014-09-12 NOTE — Patient Instructions (Signed)
Remote monitoring is used to monitor your pacemaker from home. This monitoring reduces the number of office visits required to check your device to one time per year. It allows [name] to keep an eye on the functioning of your device to ensure it is working properly. You are scheduled for a device check from home on 12-13-2014. You may send your transmission at any time that day. If you have a wireless device, the transmission will be sent automatically. After your physician reviews your transmission, you will receive a postcard with your next transmission date.  Your physician recommends that you schedule a follow-up appointment in: 6 months with Dr.Croitoru

## 2014-09-12 NOTE — Progress Notes (Signed)
Patient ID: [name], female   DOB: Dec 01, 1932, 80 y.o.   MRN: [id number]     Reason for office visit Atrial fibrillation, pacemaker check  Mrs. Stingley has not had any major health challenges this year. She has rare episodes of palpitations, not particularly troublesome. She has not had any bleeding problems and has exceptionally steady anticoagulation levels monitored in our Coumadin clinic. She does not have any focal neurological events or other signs of embolism and no bleeding problems. She has never had a stroke or TIA. Has no evidence of structural heart disease by previous echo and nuclear stress testing, had minimal CAD on angiography 2011. She has preserved left ventricular systolic function. There are subtle signs of diastolic function abnormality, but no clinical heart failure.  Her dual chamber pacemaker (St. Jude) was implanted in 2010 for sinus node dysfunction. 85% A paced, <2% V paced. Device interrogation shows a stable burden of AF of around 1.3%. Longest recent episode lasted about 5 hours in July. Very rarely has RVR, at times when AF seems more organized, almost flutter-like. Normal lead parameters . Longevity approximately 7.5 years.  Has a history of multiple meningiomas, status post resection and gamma knife therapy. The patient has a history of seizures associated with this, well controlled. She has occasional headaches.  Allergies  Allergen Reactions  . Dilantin [Phenytoin Sodium Extended] Hives  . Phenobarbital Hives    Current Outpatient Prescriptions  Medication Sig Dispense Refill  . allopurinol (ZYLOPRIM) 100 MG tablet Take 100 mg by mouth daily.      [name] aspirin 81 MG tablet Take 81 mg by mouth daily.      . colchicine 0.6 MG tablet Take 1 tablet (0.6 mg total) by mouth as directed.  35 tablet  0  . ferrous sulfate 324 (65 FE) MG TBEC Take 1 tablet (324 mg total) by mouth daily.  30 tablet  6  . hydrochlorothiazide (HYDRODIURIL) 25 MG tablet Take 25 mg  by mouth daily.      . Multiple Vitamins-Minerals (MULTIVITAMIN PO) Take by mouth daily.      . nitroGLYCERIN (NITROLINGUAL) 0.4 MG/SPRAY spray Place 1 spray under the tongue every 5 (five) minutes as needed for chest pain.  12 g  2  . omeprazole (PRILOSEC) 20 MG capsule Take 20 mg by mouth daily.      . potassium chloride (KLOR-CON) 8 MEQ tablet Take 2 tablets (16 mEq total) by mouth daily.  180 tablet  3  . pravastatin (PRAVACHOL) 20 MG tablet Take 20 mg by mouth at bedtime.      . topiramate (TOPAMAX) 50 MG tablet Take 50 mg by mouth 2 (two) times daily.      . verapamil (CALAN-SR) 120 MG CR tablet Take 120 mg by mouth at bedtime.      [name] warfarin (COUMADIN) 5 MG tablet Take 1 tablet by mouth daily or as directed  90 tablet  1   No current facility-administered medications for this visit.    Past Medical History  Diagnosis Date  . Seizure 2006  . Chest pain     2D Echo 04/21/08 EF = >55%. Moderate tricuspid regurgitation. Persantine Myovoew stress test 04/21/08 EF = 81%, no evidence of ischemia noted.  . Coronary artery disease   . Dyslipidemia   . Chronic edema     Mild LE edema  . Severe obesity   . Chronic anticoagulation     PAF  . SOB (shortness of breath) on exertion  Class II. Catheterization 04/30/10 showed mild CAD with mildly elevated right heart pressures.   . Arthritis   . Back pain     Secondary to arthritis  . Mild depression   . Brain tumor     On right side. Removed in 1989  . Vertigo   . GERD (gastroesophageal reflux disease)   . Chronic kidney disease     Mild, stage 2  . ACL (anterior cruciate ligament) rupture     Left  . Meningioma     Resection in 1998  . Ovarian cyst     Benign. Had a salpiingoophorectomy in 2005.  [name] Hyperlipidemia     On pravastatin  . PAF (paroxysmal atrial fibrillation)     On coumadin. St. Jude PPM (serial [id number]) inserted 2010  . Paroxysmal atrial flutter   . Sinus node dysfunction     S/P implantation of a dual-chamber  permanent pacemaker in 2010  . Tachycardia-bradycardia syndrome     S/P implantation of dual-chamber permanent pacemaker in 2010  . Sick sinus syndrome   . Bruit     Duplex doppler 03/28/05 Mildly abnormal. *Right subclavian artery: Less than 50% diameter reduction.  . Acute MI   . Coronary atherosclerosis     Minimal  . Memory difficulty 07/13/2014  . Trochlear nerve palsy     Right    Past Surgical History  Procedure Laterality Date  . Brain tumor removal  1989  . Craniotomy  1989    (Brain tumor) With gamma knife procedure as well.  . Pacemaker insertion  04/27/09    Implanted by Dr. Elisabeth Cara. [place] (serial [id number])  . Cardiac catheterization  04/30/10    Showed mild CAD with mildly elevated right heart pressures. (Dr. Elisabeth Cara)  . Knee surgery      Bilateral, arthroscopic    Family History  Problem Relation Age of Onset  . Stroke Mother   . Heart disease Father   . Heart disease Sister   . Heart disease Brother   . Heart disease Brother   . Heart disease Brother   . Heart disease Sister   . Heart disease Sister     History   Social History  . Marital Status: Divorced    Spouse Name: N/A    Number of Children: 3  . Years of Education: N/A   Occupational History  .      Retired   Social History Main Topics  . Smoking status: Former Smoker -- 20 years    Types: Cigarettes    Quit date: 12/23/2007  . Smokeless tobacco: Not on file  . Alcohol Use: No  . Drug Use: No  . Sexual Activity: Not on file   Other Topics Concern  . Not on file   Social History Narrative  . No narrative on file    Review of systems: Had one brief episode of chest pain at rest, no exertional chest pain. Palpitations are noticeable, but not severe Dyspnea is exertional, NYHA class II, not changed. Denies edema. Mild constipation.  PHYSICAL EXAM BP 136/70  Pulse 84  Resp 16  Ht _0  (1.676 m)  Wt 99.746 kg (219 lb 14.4 oz)  BMI 35.51 kg/m2 General: Alert, oriented  x3, no distress  Head: no evidence of trauma, PERRL, EOMI, no exophtalmos or lid lag, no myxedema, no xanthelasma; normal ears, nose and oropharynx  Neck: normal jugular venous pulsations and no hepatojugular reflux; brisk carotid pulses without delay and no carotid  bruits  Chest: clear to auscultation, no signs of consolidation by percussion or palpation, normal fremitus, symmetrical and full respiratory excursions; healthy left subclavian pacemaker site  Cardiovascular: normal position and quality of the apical impulse, regular rhythm, normal first and second heart sounds, no murmurs, rubs or gallops  Abdomen: no tenderness or distention, no masses by palpation, no abnormal pulsatility or arterial bruits, normal bowel sounds, no hepatosplenomegaly  Extremities: no clubbing, cyanosis or edema; 2+ radial, ulnar and brachial pulses bilaterally; 2+ right femoral, posterior tibial and dorsalis pedis pulses; 2+ left femoral, posterior tibial and dorsalis pedis pulses; no subclavian or femoral bruits  Neurological: grossly nonfocal   EKG: A paced, V sensed  BMET    Component Value Date/Time   NA 139 06/14/2014 1227   K 4.1 06/14/2014 1227   CL 105 06/14/2014 1227   CO2 21 06/14/2014 1227   GLUCOSE 83 06/14/2014 1227   BUN 42* 06/14/2014 1227   CREATININE 1.74* 06/14/2014 1227   CREATININE 1.38* 04/27/2009 0434   CALCIUM 10.3 06/14/2014 1227   GFRNONAA 37* 04/27/2009 0434   GFRAA  Value: 45        The eGFR has been calculated using the MDRD equation. This calculation has not been validated in all clinical situations. eGFR's persistently <60 mL/min signify possible Chronic Kidney Disease.* 04/27/2009 0434     ASSESSMENT AND PLAN No problem-specific assessment & plan notes found for this encounter. Paroxysmal atrial fibrillation  Episodes are only mildly symptomatic, manifesting as palpitations. Her overall burden of atrial fibrillation around  1%. Ventricular rate control is generally good during the  spells of atrial fibrillation. She is on appropriate anticoagulation with warfarin and has remarkably consistent anticoagulation levels. Antiarrhythmic therapy does not appear to be justified. Offered increased dose verapamil for better rate control, but she prefers not to change her meds. Pacemaker - dual chamber St. Jude RF device, implanted May 2010  Normal device checked by remote monitoring last month. Lead parameters are excellent. Battery voltage suggests over 7 years longevity.  HTN (hypertension)  Well-controlled  CKD (chronic kidney disease) stage 3 Labs this year show deterioration in renal function. Consider reducing diuretics further. Obesity (BMI 30.0-34.9)  No weight loss since since last visit.  Patient Instructions  Remote monitoring is used to monitor your pacemaker from home. This monitoring reduces the number of office visits required to check your device to one time per year. It allows [name] to keep an eye on the functioning of your device to ensure it is working properly. You are scheduled for a device check from home on 12-13-2014. You may send your transmission at any time that day. If you have a wireless device, the transmission will be sent automatically. After your physician reviews your transmission, you will receive a postcard with your next transmission date.  Your physician recommends that you schedule a follow-up appointment in: 6 months with Dr.Abegail Kloeppel     Orders Placed This Encounter  Procedures  . Implantable device check    Davian Hanshaw  Sanda Klein, MD, [facility] [phone] office [phone] pager

## 2014-09-24 ENCOUNTER — Other Ambulatory Visit: Payer: Self-pay | Admitting: Cardiology

## 2014-09-25 NOTE — Telephone Encounter (Signed)
Ok to refill for 6 months 

## 2014-09-26 NOTE — Telephone Encounter (Signed)
Rx was sent to pharmacy electronically. 

## 2014-10-04 ENCOUNTER — Ambulatory Visit (INDEPENDENT_AMBULATORY_CARE_PROVIDER_SITE_OTHER): Payer: Medicare Other | Admitting: Pharmacist Clinician (PhC)/ Clinical Pharmacy Specialist

## 2014-10-04 DIAGNOSIS — I480 Paroxysmal atrial fibrillation: Secondary | ICD-10-CM

## 2014-10-04 LAB — POCT INR: INR: 2.4

## 2014-10-17 ENCOUNTER — Encounter (HOSPITAL_COMMUNITY): Payer: Self-pay | Admitting: Emergency Medicine

## 2014-10-17 ENCOUNTER — Emergency Department (HOSPITAL_COMMUNITY): Payer: Medicare Other

## 2014-10-17 ENCOUNTER — Emergency Department (HOSPITAL_COMMUNITY)
Admission: EM | Admit: 2014-10-17 | Discharge: 2014-10-17 | Disposition: A | Discharge status: Home / Self Care | Payer: Medicare Other | Attending: Emergency Medicine | Admitting: Emergency Medicine

## 2014-10-17 DIAGNOSIS — Z7901 Long term (current) use of anticoagulants: Secondary | ICD-10-CM | POA: Diagnosis not present

## 2014-10-17 DIAGNOSIS — Z8742 Personal history of other diseases of the female genital tract: Secondary | ICD-10-CM | POA: Diagnosis not present

## 2014-10-17 DIAGNOSIS — M1990 Unspecified osteoarthritis, unspecified site: Secondary | ICD-10-CM | POA: Insufficient documentation

## 2014-10-17 DIAGNOSIS — Z79899 Other long term (current) drug therapy: Secondary | ICD-10-CM | POA: Insufficient documentation

## 2014-10-17 DIAGNOSIS — E669 Obesity, unspecified: Secondary | ICD-10-CM | POA: Insufficient documentation

## 2014-10-17 DIAGNOSIS — S0990XA Unspecified injury of head, initial encounter: Secondary | ICD-10-CM | POA: Insufficient documentation

## 2014-10-17 DIAGNOSIS — I2510 Atherosclerotic heart disease of native coronary artery without angina pectoris: Secondary | ICD-10-CM | POA: Diagnosis not present

## 2014-10-17 DIAGNOSIS — G40909 Epilepsy, unspecified, not intractable, without status epilepticus: Secondary | ICD-10-CM | POA: Diagnosis not present

## 2014-10-17 DIAGNOSIS — Z8669 Personal history of other diseases of the nervous system and sense organs: Secondary | ICD-10-CM | POA: Diagnosis not present

## 2014-10-17 DIAGNOSIS — F329 Major depressive disorder, single episode, unspecified: Secondary | ICD-10-CM | POA: Diagnosis not present

## 2014-10-17 DIAGNOSIS — Z7982 Long term (current) use of aspirin: Secondary | ICD-10-CM | POA: Diagnosis not present

## 2014-10-17 DIAGNOSIS — Z87891 Personal history of nicotine dependence: Secondary | ICD-10-CM | POA: Insufficient documentation

## 2014-10-17 DIAGNOSIS — K219 Gastro-esophageal reflux disease without esophagitis: Secondary | ICD-10-CM | POA: Insufficient documentation

## 2014-10-17 DIAGNOSIS — I252 Old myocardial infarction: Secondary | ICD-10-CM | POA: Diagnosis not present

## 2014-10-17 DIAGNOSIS — N189 Chronic kidney disease, unspecified: Secondary | ICD-10-CM | POA: Diagnosis not present

## 2014-10-17 DIAGNOSIS — H538 Other visual disturbances: Secondary | ICD-10-CM | POA: Diagnosis not present

## 2014-10-17 DIAGNOSIS — Z9889 Other specified postprocedural states: Secondary | ICD-10-CM | POA: Insufficient documentation

## 2014-10-17 DIAGNOSIS — E785 Hyperlipidemia, unspecified: Secondary | ICD-10-CM | POA: Diagnosis not present

## 2014-10-17 MED ORDER — TRAMADOL HCL 50 MG PO TABS: 50.0000 mg | ORAL_TABLET | Freq: Four times a day (QID) | ORAL | Status: DC | PRN

## 2014-10-17 NOTE — ED Provider Notes (Signed)
CSN: [id number]     Arrival date & time 10/17/14  1558 History   First MD Initiated Contact with Patient 10/17/14 1941     Chief Complaint  Patient presents with  . Head Injury  . Blurred Vision    HPI Pt bumped her head on a car door 5 days ago.  Initially she did not think much of it but recently she started having sharp pain in the right side of her head.  She has also noticed some blurred vision in the right eye that come and goes.  No vomiting.  No confusion or weakness.  She called her doctor who told her to get a head CT. Past Medical History  Diagnosis Date  . Seizure 2006  . Chest pain     2D Echo 04/21/08 EF = >55%. Moderate tricuspid regurgitation. Persantine Myovoew stress test 04/21/08 EF = 81%, no evidence of ischemia noted.  . Coronary artery disease   . Dyslipidemia   . Chronic edema     Mild LE edema  . Severe obesity   . Chronic anticoagulation     PAF  . SOB (shortness of breath) on exertion     Class II. Catheterization 04/30/10 showed mild CAD with mildly elevated right heart pressures.   . Arthritis   . Back pain     Secondary to arthritis  . Mild depression   . Brain tumor     On right side. Removed in 1989  . Vertigo   . GERD (gastroesophageal reflux disease)   . Chronic kidney disease     Mild, stage 2  . ACL (anterior cruciate ligament) rupture     Left  . Meningioma     Resection in 1998  . Ovarian cyst     Benign. Had a salpiingoophorectomy in 2005.  [name] Hyperlipidemia     On pravastatin  . PAF (paroxysmal atrial fibrillation)     On coumadin. St. Jude PPM (serial [id number]) inserted 2010  . Paroxysmal atrial flutter   . Sinus node dysfunction     S/P implantation of a dual-chamber permanent pacemaker in 2010  . Tachycardia-bradycardia syndrome     S/P implantation of dual-chamber permanent pacemaker in 2010  . Sick sinus syndrome   . Bruit     Duplex doppler 03/28/05 Mildly abnormal. *Right subclavian artery: Less than 50% diameter reduction.  .  Acute MI   . Coronary atherosclerosis     Minimal  . Memory difficulty 07/13/2014  . Trochlear nerve palsy     Right   Past Surgical History  Procedure Laterality Date  . Brain tumor removal  1989  . Craniotomy  1989    (Brain tumor) With gamma knife procedure as well.  . Pacemaker insertion  04/27/09    Implanted by Dr. Elisabeth Cara. [place] (serial [id number])  . Cardiac catheterization  04/30/10    Showed mild CAD with mildly elevated right heart pressures. (Dr. Elisabeth Cara)  . Knee surgery      Bilateral, arthroscopic   Family History  Problem Relation Age of Onset  . Stroke Mother   . Heart disease Father   . Heart disease Sister   . Heart disease Brother   . Heart disease Brother   . Heart disease Brother   . Heart disease Sister   . Heart disease Sister    History  Substance Use Topics  . Smoking status: Former Smoker -- 20 years    Types: Cigarettes    Quit date:  12/23/2007  . Smokeless tobacco: Not on file  . Alcohol Use: No   OB History   Grav Para Term Preterm Abortions TAB SAB Ect Mult Living                 Review of Systems  All other systems reviewed and are negative.     Allergies  Dilantin and Phenobarbital  Home Medications   Prior to Admission medications   Medication Sig Start Date End Date Taking? Authorizing Provider  allopurinol (ZYLOPRIM) 100 MG tablet Take 100 mg by mouth daily.   Yes Historical Provider, MD  aspirin 81 MG tablet Take 81 mg by mouth daily.   Yes Historical Provider, MD  colchicine 0.6 MG tablet Take 0.6 mg by mouth daily.   Yes Historical Provider, MD  ferrous sulfate 324 (65 FE) MG TBEC Take 324 mg by mouth daily.   Yes Historical Provider, MD  hydrochlorothiazide (HYDRODIURIL) 25 MG tablet Take 25 mg by mouth daily.   Yes Historical Provider, MD  Multiple Vitamins-Minerals (MULTIVITAMIN PO) Take by mouth daily.   Yes Historical Provider, MD  nitroGLYCERIN (NITROLINGUAL) 0.4 MG/SPRAY spray Place 3 sprays under the  tongue every 5 (five) minutes x 3 doses as needed for chest pain.   Yes Historical Provider, MD  omeprazole (PRILOSEC) 20 MG capsule Take 20 mg by mouth daily.   Yes Historical Provider, MD  potassium chloride (KLOR-CON) 8 MEQ tablet Take 16 mEq by mouth daily.   Yes Historical Provider, MD  pravastatin (PRAVACHOL) 20 MG tablet Take 20 mg by mouth at bedtime.   Yes Historical Provider, MD  topiramate (TOPAMAX) 50 MG tablet Take 50 mg by mouth 2 (two) times daily.   Yes Historical Provider, MD  verapamil (CALAN-SR) 120 MG CR tablet Take 120 mg by mouth at bedtime.   Yes Historical Provider, MD  warfarin (COUMADIN) 5 MG tablet Take 2.5-5 mg by mouth at bedtime. Take 2.5mg  ONLY on Mon & Friday Take 5mg  all other days including Sundays   Yes Historical Provider, MD   BP 139/65  Pulse 65  Temp(Src) 98.6 F (37 C) (Oral)  Resp 18  SpO2 95% Physical Exam  Nursing note and vitals reviewed. Constitutional: She appears well-developed and well-nourished. No distress.  HENT:  Head: Normocephalic and atraumatic.  Right Ear: External ear normal.  Left Ear: External ear normal.  No step off, no ecchymoses or deformity  Eyes: Conjunctivae are normal. Right eye exhibits no discharge. Left eye exhibits no discharge. No scleral icterus.  Neck: Neck supple. No tracheal deviation present.  Cardiovascular: Normal rate, regular rhythm and intact distal pulses.   Pulmonary/Chest: Effort normal and breath sounds normal. No stridor. No respiratory distress. She has no wheezes. She has no rales.  Abdominal: Soft. Bowel sounds are normal. She exhibits no distension. There is no tenderness. There is no rebound and no guarding.  Musculoskeletal: She exhibits no edema and no tenderness.  Neurological: She is alert. She has normal strength. No cranial nerve deficit (no facial droop, extraocular movements intact, no slurred speech) or sensory deficit. She exhibits normal muscle tone. She displays no seizure activity.  Coordination normal.  Skin: Skin is warm and dry. No rash noted.  Psychiatric: She has a normal mood and affect.    ED Course  Procedures (including critical care time) Labs Review Labs Reviewed - No data to display  Imaging Review Ct Head Wo Contrast  10/17/2014   CLINICAL DATA:  Head injury while getting out of  car. Initial encounter  EXAM: CT HEAD WITHOUT CONTRAST  TECHNIQUE: Contiguous axial images were obtained from the base of the skull through the vertex without intravenous contrast.  COMPARISON:  07/20/2014  FINDINGS: Skull and Sinuses:Stable appearance of remote right pterional craniotomy. There is chronic minimal opacification of bilateral mastoid air cells. No acute fracture.  Orbits: Bilateral cataract resection.  Brain: No evidence of acute abnormality, such as acute infarction, hemorrhage, hydrocephalus, or mass lesion/mass effect. Generalized cerebral volume loss which is stable from 2011. Gliosis in the right temporal lobe correlating with history of meningioma resection. Meningioma noted along the medial right middle cranial fossa on brain MRI 2007. No CT evidence of interval enlargement.  IMPRESSION: 1. No evidence of acute intracranial injury. 2. Chronic findings are described above.   Electronically Signed   By: Jorje Guild M.D.   On: 10/17/2014 18:53      MDM   Final diagnoses:  Head injury    Minor head injury.  No significant findings noted on CT.  Will dc home with RX for Donnie Mesa, MD 10/17/14 2003

## 2014-10-17 NOTE — Discharge Instructions (Signed)
Blunt Trauma °You have been evaluated for injuries. You have been examined and your caregiver has not found injuries serious enough to require hospitalization. °It is common to have multiple bruises and sore muscles following an accident. These tend to feel worse for the first 24 hours. You will feel more stiffness and soreness over the next several hours and worse when you wake up the first morning after your accident. After this point, you should begin to improve with each passing day. The amount of improvement depends on the amount of damage done in the accident. °Following your accident, if some part of your body does not work as it should, or if the pain in any area continues to increase, you should return to the Emergency Department for re-evaluation.  °HOME CARE INSTRUCTIONS  °Routine care for sore areas should include: °· Ice to sore areas every 2 hours for 20 minutes while awake for the next 2 days. °· Drink extra fluids (not alcohol). °· Take a hot or warm shower or bath once or twice a day to increase blood flow to sore muscles. This will help you "limber up". °· Activity as tolerated. Lifting may aggravate neck or back pain. °· Only take over-the-counter or prescription medicines for pain, discomfort, or fever as directed by your caregiver. Do not use aspirin. This may increase bruising or increase bleeding if there are small areas where this is happening. °SEEK IMMEDIATE MEDICAL CARE IF: °· Numbness, tingling, weakness, or problem with the use of your arms or legs. °· A severe headache is not relieved with medications. °· There is a change in bowel or bladder control. °· Increasing pain in any areas of the body. °· Short of breath or dizzy. °· Nauseated, vomiting, or sweating. °· Increasing belly (abdominal) discomfort. °· Blood in urine, stool, or vomiting blood. °· Pain in either shoulder in an area where a shoulder strap would be. °· Feelings of lightheadedness or if you have a fainting  episode. °Sometimes it is not possible to identify all injuries immediately after the trauma. It is important that you continue to monitor your condition after the emergency department visit. If you feel you are not improving, or improving more slowly than should be expected, call your physician. If you feel your symptoms (problems) are worsening, return to the Emergency Department immediately. °Document Released: 09/03/2001 Document Revised: 03/01/2012 Document Reviewed: 07/26/2008 °Exit[employer]® Patient Information ©2015 [employer]. This information is not intended to replace advice given to you by your health care provider. Make sure you discuss any questions you have with your health care provider. ° °

## 2014-10-17 NOTE — ED Notes (Addendum)
Pt reports hitting head while getting into car appx 5 days ago. Denies LOC. States she now has intermittent 10/10 sharp shooting pain on right side of head. States blurred vision in right eye that comes and goes. Pt ambulatory triage. Pt's neuro intact. States she took tylenol last night which briefly relieved pain. Pt takes Coumadin.

## 2014-10-17 NOTE — ED Notes (Signed)
Pt made aware to return if symptoms worsen or if any life threatening symptoms occur.   

## 2014-11-08 ENCOUNTER — Encounter: Payer: Self-pay | Admitting: Neurology

## 2014-11-11 ENCOUNTER — Other Ambulatory Visit: Payer: Self-pay | Admitting: Pharmacist Clinician (PhC)/ Clinical Pharmacy Specialist

## 2014-11-14 ENCOUNTER — Encounter: Payer: Self-pay | Admitting: Neurology

## 2014-11-15 ENCOUNTER — Ambulatory Visit (INDEPENDENT_AMBULATORY_CARE_PROVIDER_SITE_OTHER): Payer: Medicare Other | Admitting: Pharmacist Clinician (PhC)/ Clinical Pharmacy Specialist

## 2014-11-15 DIAGNOSIS — I4891 Unspecified atrial fibrillation: Secondary | ICD-10-CM

## 2014-11-15 DIAGNOSIS — Z7901 Long term (current) use of anticoagulants: Secondary | ICD-10-CM

## 2014-11-15 DIAGNOSIS — I480 Paroxysmal atrial fibrillation: Secondary | ICD-10-CM

## 2014-11-15 LAB — POCT INR: INR: 1.5

## 2014-12-06 ENCOUNTER — Ambulatory Visit (INDEPENDENT_AMBULATORY_CARE_PROVIDER_SITE_OTHER): Payer: Medicare Other | Admitting: Pharmacist Clinician (PhC)/ Clinical Pharmacy Specialist

## 2014-12-06 DIAGNOSIS — I4891 Unspecified atrial fibrillation: Secondary | ICD-10-CM

## 2014-12-06 DIAGNOSIS — Z7901 Long term (current) use of anticoagulants: Secondary | ICD-10-CM

## 2014-12-06 DIAGNOSIS — I480 Paroxysmal atrial fibrillation: Secondary | ICD-10-CM

## 2014-12-06 LAB — POCT INR: INR: 1.5

## 2014-12-13 ENCOUNTER — Encounter: Payer: Self-pay | Admitting: Cardiovascular Disease

## 2014-12-13 ENCOUNTER — Ambulatory Visit (INDEPENDENT_AMBULATORY_CARE_PROVIDER_SITE_OTHER): Payer: Medicare Other | Admitting: *Deleted

## 2014-12-13 DIAGNOSIS — I495 Sick sinus syndrome: Secondary | ICD-10-CM

## 2014-12-13 LAB — MDC_IDC_ENUM_SESS_TYPE_REMOTE
Brady Statistic AP VP Percent: 1.4 %
Brady Statistic AS VP Percent: 1 %
Brady Statistic AS VS Percent: 11 %
Implantable Pulse Generator Model: 2210
Implantable Pulse Generator Serial Number: 2261146
Lead Channel Impedance Value: 510 Ohm
Lead Channel Pacing Threshold Amplitude: 0.625 V
Lead Channel Pacing Threshold Amplitude: 1 V
Lead Channel Pacing Threshold Pulse Width: 0.4 ms
Lead Channel Setting Pacing Amplitude: 1.625
Lead Channel Setting Pacing Pulse Width: 0.4 ms
Lead Channel Setting Sensing Sensitivity: 2 mV
MDC IDC MSMT BATTERY REMAINING LONGEVITY: 73 mo
MDC IDC MSMT BATTERY REMAINING PERCENTAGE: 59 %
MDC IDC MSMT BATTERY VOLTAGE: 2.92 V
MDC IDC MSMT LEADCHNL RA IMPEDANCE VALUE: 330 Ohm
MDC IDC MSMT LEADCHNL RA PACING THRESHOLD PULSEWIDTH: 0.4 ms
MDC IDC MSMT LEADCHNL RA SENSING INTR AMPL: 5 mV
MDC IDC MSMT LEADCHNL RV SENSING INTR AMPL: 12 mV
MDC IDC SESS DTM: 20151223070012
MDC IDC SET LEADCHNL RV PACING AMPLITUDE: 1.25 V
MDC IDC STAT BRADY AP VS PERCENT: 85 %
MDC IDC STAT BRADY RA PERCENT PACED: 83 %
MDC IDC STAT BRADY RV PERCENT PACED: 2.3 %

## 2014-12-13 NOTE — Progress Notes (Signed)
Remote pacemaker transmission.   

## 2014-12-20 ENCOUNTER — Ambulatory Visit (INDEPENDENT_AMBULATORY_CARE_PROVIDER_SITE_OTHER): Payer: Medicare Other | Admitting: Pharmacist Clinician (PhC)/ Clinical Pharmacy Specialist

## 2014-12-20 DIAGNOSIS — I4891 Unspecified atrial fibrillation: Secondary | ICD-10-CM

## 2014-12-20 DIAGNOSIS — I480 Paroxysmal atrial fibrillation: Secondary | ICD-10-CM

## 2014-12-20 DIAGNOSIS — Z7901 Long term (current) use of anticoagulants: Secondary | ICD-10-CM

## 2014-12-20 LAB — POCT INR: INR: 1.8

## 2014-12-25 ENCOUNTER — Encounter: Payer: Self-pay | Admitting: Cardiology

## 2015-01-03 ENCOUNTER — Ambulatory Visit: Payer: Medicare Other | Admitting: Pharmacist Clinician (PhC)/ Clinical Pharmacy Specialist

## 2015-01-03 ENCOUNTER — Ambulatory Visit (INDEPENDENT_AMBULATORY_CARE_PROVIDER_SITE_OTHER): Payer: Medicare Other | Admitting: Pharmacist Clinician (PhC)/ Clinical Pharmacy Specialist

## 2015-01-03 DIAGNOSIS — I4891 Unspecified atrial fibrillation: Secondary | ICD-10-CM

## 2015-01-03 DIAGNOSIS — I480 Paroxysmal atrial fibrillation: Secondary | ICD-10-CM

## 2015-01-03 DIAGNOSIS — Z7901 Long term (current) use of anticoagulants: Secondary | ICD-10-CM

## 2015-01-03 LAB — POCT INR: INR: 2.7

## 2015-01-31 ENCOUNTER — Ambulatory Visit (INDEPENDENT_AMBULATORY_CARE_PROVIDER_SITE_OTHER): Payer: Medicare Other | Admitting: Pharmacist Clinician (PhC)/ Clinical Pharmacy Specialist

## 2015-01-31 DIAGNOSIS — I4891 Unspecified atrial fibrillation: Secondary | ICD-10-CM

## 2015-01-31 DIAGNOSIS — I480 Paroxysmal atrial fibrillation: Secondary | ICD-10-CM

## 2015-01-31 DIAGNOSIS — Z7901 Long term (current) use of anticoagulants: Secondary | ICD-10-CM

## 2015-01-31 LAB — POCT INR: INR: 2.2

## 2015-02-09 IMAGING — CT CT HEAD W/O CM
1 series · 16 of 30 positions shown, 20 images · non-contrast
Comparison: 07/20/2014

CLINICAL DATA: Head injury while getting out of car. Initial
encounter

EXAM:
CT HEAD WITHOUT CONTRAST
TECHNIQUE: Contiguous axial images were obtained from the base of the skull
through the vertex without intravenous contrast.

[Series 2: head 5.0 h30s · axial · 0.45mm/px · z∈[-149,-9]mm · 16 of 32 slices shown, 20 images]
[im 2/32  brain]
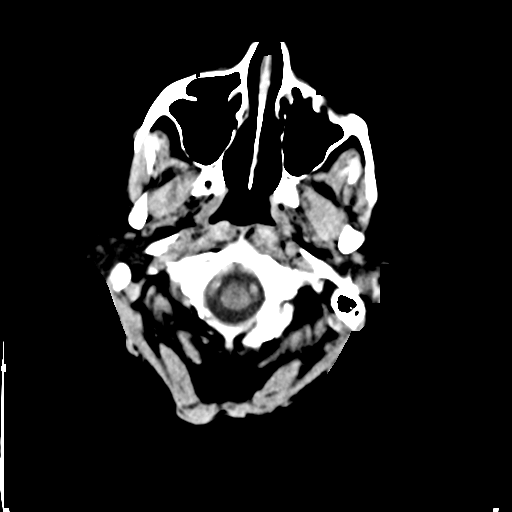
[im 2/32  bone]
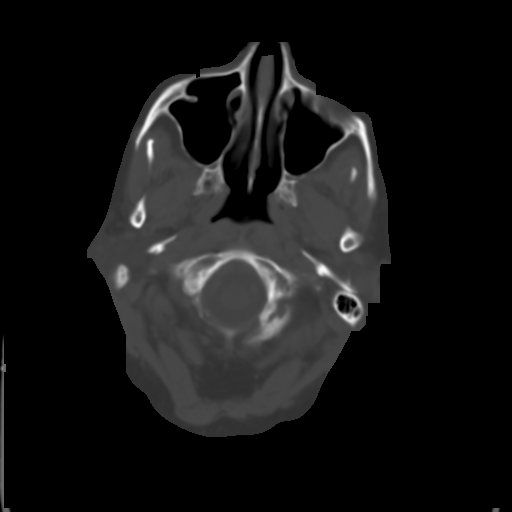
[im 4/32  brain]
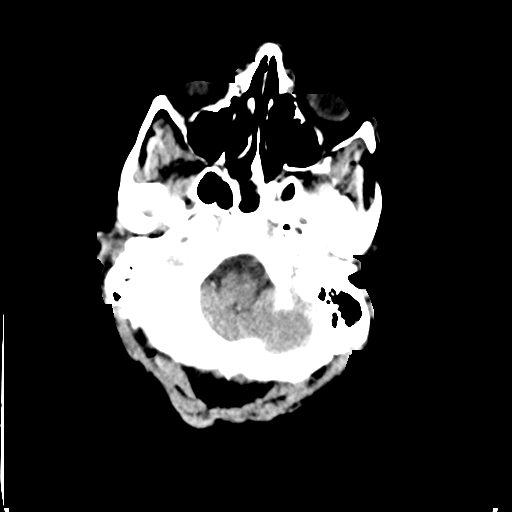
[im 6/32  brain]
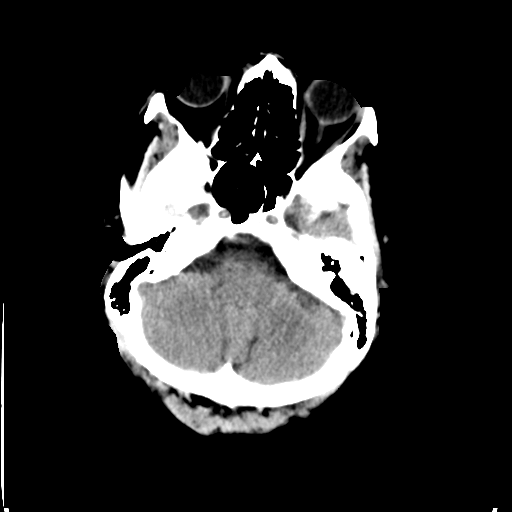
[im 8/32  brain]
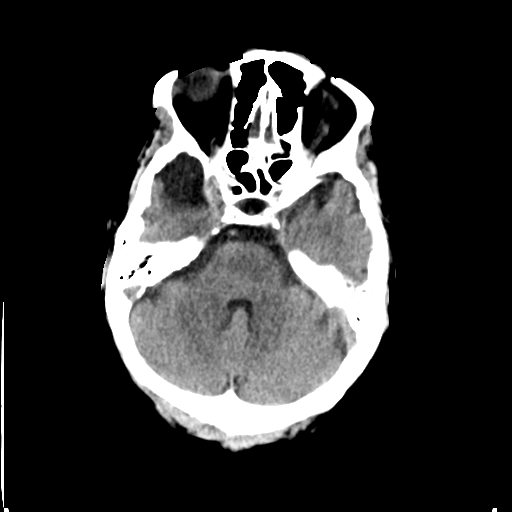
[im 9/32  brain]
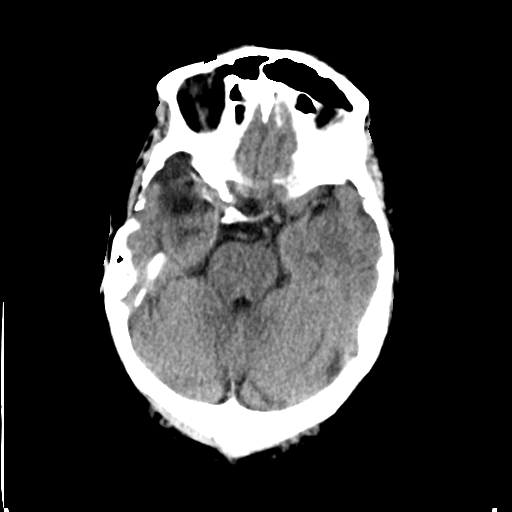
[im 9/32  bone]
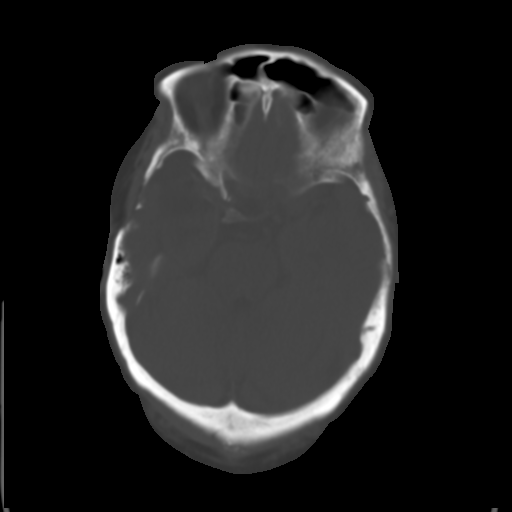
[im 11/32  brain]
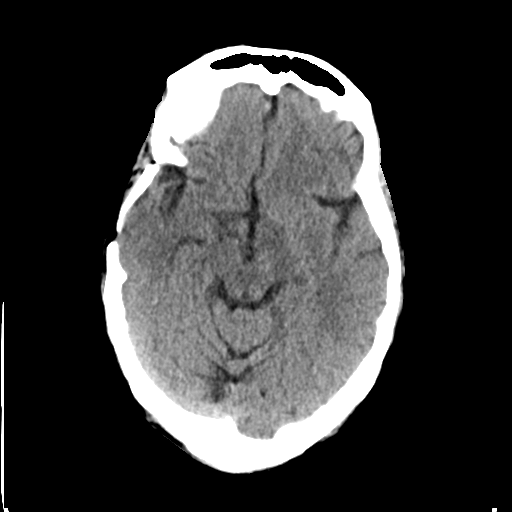
[im 13/32  brain]
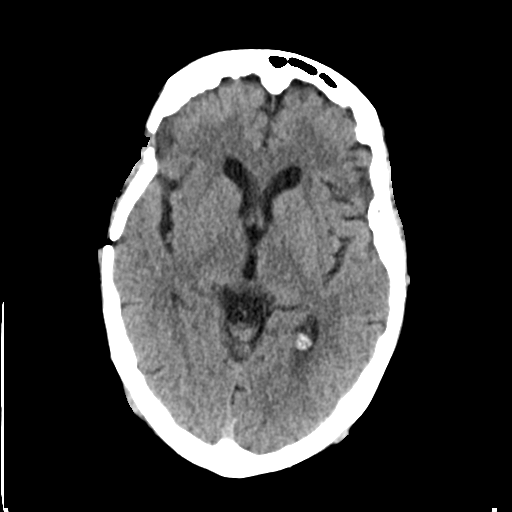
[im 15/32  brain]
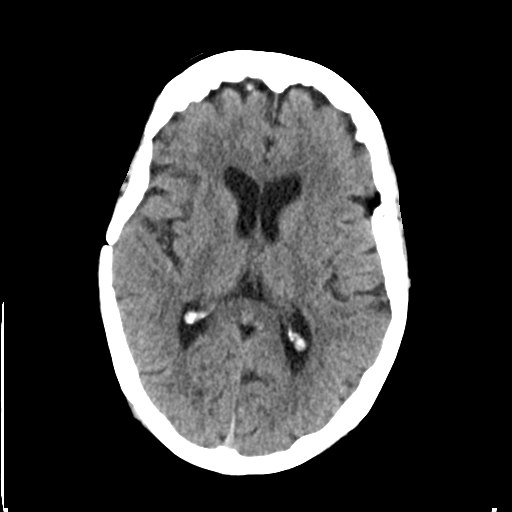
[im 17/32  brain]
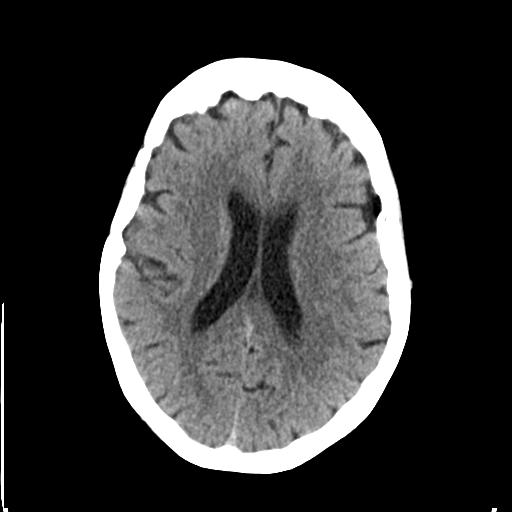
[im 17/32  bone]
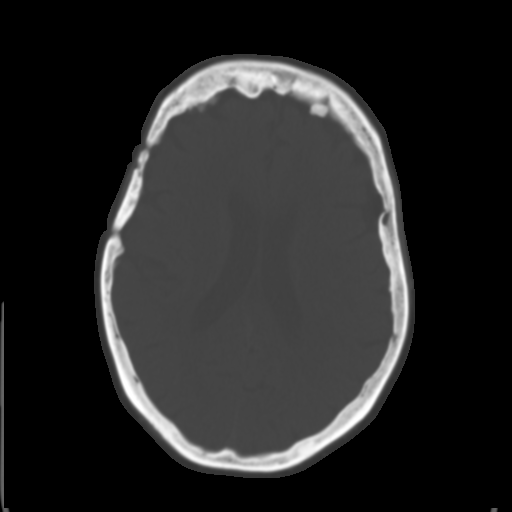
[im 19/32  brain]
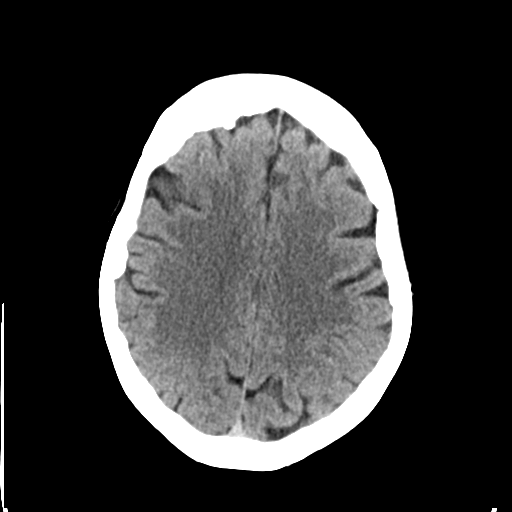
[im 21/32  brain]
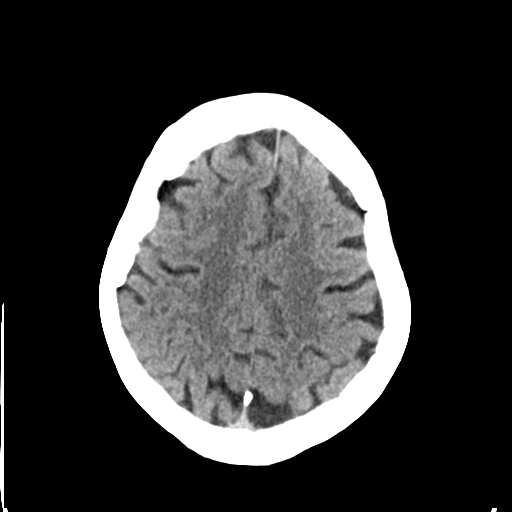
[im 23/32  brain]
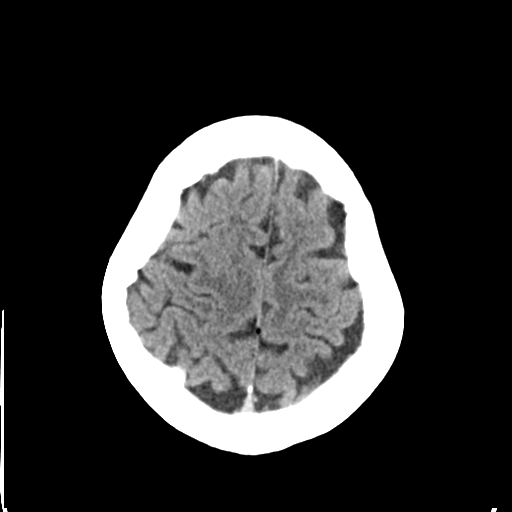
[im 24/32  brain]
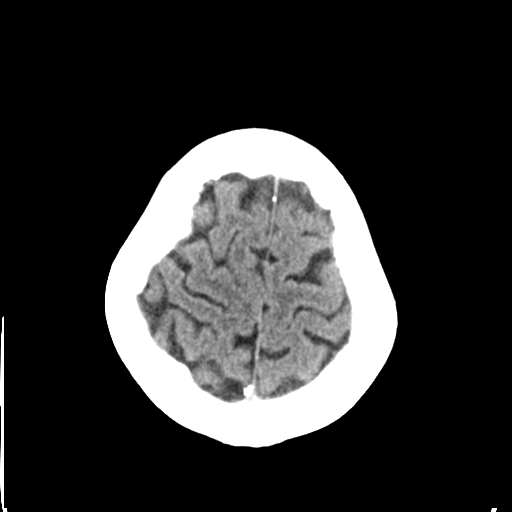
[im 24/32  bone]
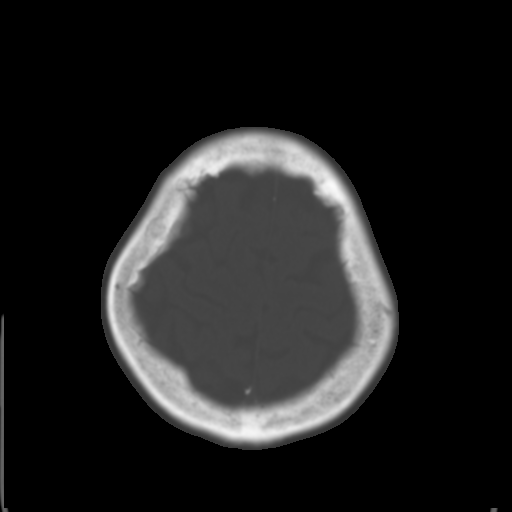
[im 26/32  brain]
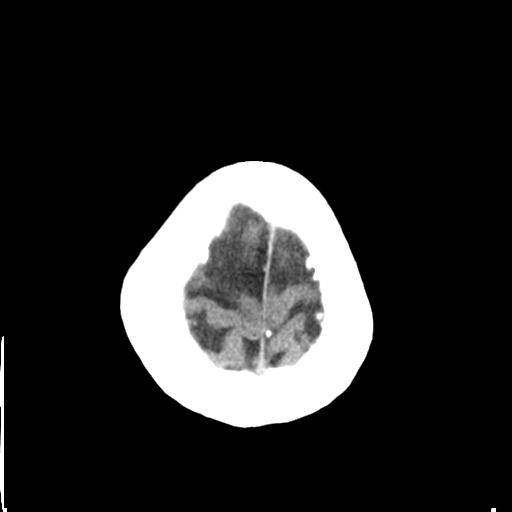
[im 28/32  brain]
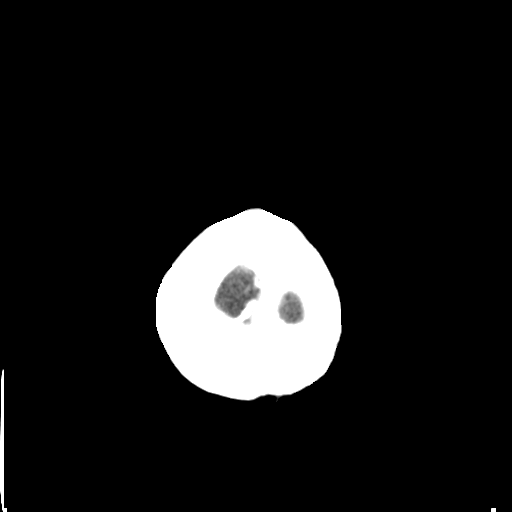
[im 30/32  brain]
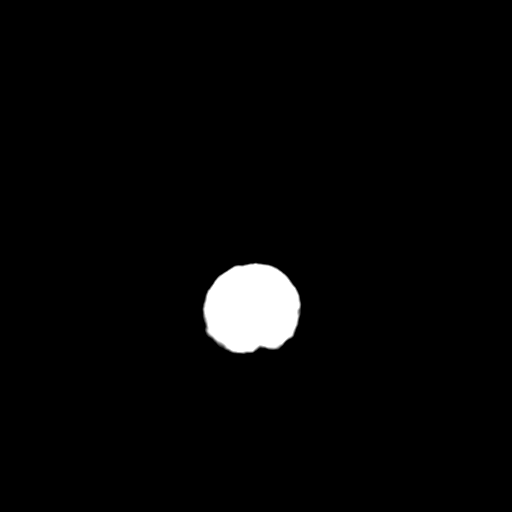

[16 of 30 positions shown; findings below may reference images not displayed]

FINDINGS: Skull and Sinuses:Stable appearance of remote right pterional
craniotomy. There is chronic minimal opacification of bilateral
mastoid air cells. No acute fracture.

Orbits: Bilateral cataract resection.

Brain: No evidence of acute abnormality, such as acute infarction,
hemorrhage, hydrocephalus, or mass lesion/mass effect. Generalized
cerebral volume loss which is stable from 3755. Gliosis in the right
temporal lobe correlating with history of meningioma resection.
Meningioma noted along the medial right middle cranial fossa on
brain MRI 2442. No CT evidence of interval enlargement.
IMPRESSION: 1. No evidence of acute intracranial injury.
2. Chronic findings are described above.

## 2015-02-18 ENCOUNTER — Other Ambulatory Visit: Payer: Self-pay | Admitting: Cardiology

## 2015-02-19 NOTE — Telephone Encounter (Signed)
Rx(s) sent to pharmacy electronically.  

## 2015-03-02 ENCOUNTER — Ambulatory Visit (INDEPENDENT_AMBULATORY_CARE_PROVIDER_SITE_OTHER): Payer: Medicare Other | Admitting: Pharmacist Clinician (PhC)/ Clinical Pharmacy Specialist

## 2015-03-02 DIAGNOSIS — Z7901 Long term (current) use of anticoagulants: Secondary | ICD-10-CM

## 2015-03-02 DIAGNOSIS — I480 Paroxysmal atrial fibrillation: Secondary | ICD-10-CM | POA: Diagnosis not present

## 2015-03-02 DIAGNOSIS — I4891 Unspecified atrial fibrillation: Secondary | ICD-10-CM

## 2015-03-02 LAB — POCT INR: INR: 2.5

## 2015-04-10 ENCOUNTER — Ambulatory Visit (INDEPENDENT_AMBULATORY_CARE_PROVIDER_SITE_OTHER): Payer: Medicare Other | Admitting: Cardiovascular Disease

## 2015-04-10 ENCOUNTER — Encounter: Payer: Self-pay | Admitting: Cardiovascular Disease

## 2015-04-10 ENCOUNTER — Ambulatory Visit (INDEPENDENT_AMBULATORY_CARE_PROVIDER_SITE_OTHER): Payer: Medicare Other | Admitting: *Deleted

## 2015-04-10 VITALS — BP 132/74 | HR 64 | Ht 66.0 in | Wt 225.0 lb

## 2015-04-10 DIAGNOSIS — I4891 Unspecified atrial fibrillation: Secondary | ICD-10-CM | POA: Diagnosis not present

## 2015-04-10 DIAGNOSIS — I480 Paroxysmal atrial fibrillation: Secondary | ICD-10-CM

## 2015-04-10 DIAGNOSIS — R0609 Other forms of dyspnea: Secondary | ICD-10-CM

## 2015-04-10 DIAGNOSIS — R0602 Shortness of breath: Secondary | ICD-10-CM

## 2015-04-10 DIAGNOSIS — Z950 Presence of cardiac pacemaker: Secondary | ICD-10-CM | POA: Diagnosis not present

## 2015-04-10 DIAGNOSIS — I208 Other forms of angina pectoris: Secondary | ICD-10-CM

## 2015-04-10 DIAGNOSIS — Z7901 Long term (current) use of anticoagulants: Secondary | ICD-10-CM | POA: Diagnosis not present

## 2015-04-10 DIAGNOSIS — I209 Angina pectoris, unspecified: Secondary | ICD-10-CM

## 2015-04-10 DIAGNOSIS — I25119 Atherosclerotic heart disease of native coronary artery with unspecified angina pectoris: Secondary | ICD-10-CM | POA: Diagnosis not present

## 2015-04-10 DIAGNOSIS — I10 Essential (primary) hypertension: Secondary | ICD-10-CM | POA: Diagnosis not present

## 2015-04-10 LAB — MDC_IDC_ENUM_SESS_TYPE_INCLINIC
Battery Voltage: 2.92 V
Brady Statistic RV Percent Paced: 1.8 %
Implantable Pulse Generator Model: 2210
Implantable Pulse Generator Serial Number: 2261146
Lead Channel Impedance Value: 475 Ohm
Lead Channel Pacing Threshold Amplitude: 1.375 V
Lead Channel Pacing Threshold Pulse Width: 0.4 ms
Lead Channel Sensing Intrinsic Amplitude: 12 mV
Lead Channel Sensing Intrinsic Amplitude: 5 mV
Lead Channel Setting Pacing Amplitude: 1.625
Lead Channel Setting Sensing Sensitivity: 2 mV
MDC IDC MSMT BATTERY REMAINING LONGEVITY: 90 mo
MDC IDC MSMT LEADCHNL RA IMPEDANCE VALUE: 325 Ohm
MDC IDC MSMT LEADCHNL RA PACING THRESHOLD AMPLITUDE: 0.625 V
MDC IDC MSMT LEADCHNL RA PACING THRESHOLD PULSEWIDTH: 0.4 ms
MDC IDC SESS DTM: 20160419133801
MDC IDC SET LEADCHNL RV PACING AMPLITUDE: 1.625
MDC IDC SET LEADCHNL RV PACING PULSEWIDTH: 0.4 ms
MDC IDC STAT BRADY RA PERCENT PACED: 83 %

## 2015-04-10 LAB — POCT INR: INR: 4.3

## 2015-04-10 NOTE — Progress Notes (Signed)
Patient ID: [name], female   DOB: 08/28/1932, 81 y.o.   MRN: [id number]     Cardiology Office Note   Date:  04/10/2015   ID:  CLARK CLOWDUS, DOB [birth year], MRN [id number]  PCP:  Salena Saner., MD  Cardiologist:   Sanda Klein, MD   Chief Complaint  Patient presents with  . Follow-up    6 months, dyspnea has increased over past 2 months.       History of Present Illness: Marylou Wages Christine is a 81 y.o. female who presents for  Rx is normal atrial fibrillation, pacemaker check  Mrs. Pickford has not had any major health challenges this year.  recently she has noticed a reduction in stamina some fluctuations in weight and shortness of breath with moderate exertion. She does not monitor her weight on a regular basis. Her daughter openly and repeatedly pointed out during our office visit that Mrs. Busic has very high sodium intake. The patient is clearly upset with her.  She has rare episodes of palpitations, not particularly troublesome.   Mrs. Holroyd snores loudly but otherwise does not have any symptoms to suggest obstructive sleep apnea. She wakes up in the morning feeling very refreshed and never falls asleep during the day. She does not require afternoon naps.  She usually has very steady anticoagulation levels monitored in our Coumadin clinic. For the first time in years her dose of warfarin was adjusted upwards at her last Coumadin clinic appointment, but today she is markedly supratherapeutic with an INR of 4.6. She is not having any bleeding problems.She does not have any focal neurological events or other signs of embolism, she has never had a stroke or TIA.  Has no evidence of structural heart disease by previous echo and nuclear stress testing, had minimal CAD on angiography 2011. She has preserved left ventricular systolic function. There are subtle signs of diastolic function abnormality, but no clinical heart failure.  Her dual chamber pacemaker (St.  Jude) was implanted in 2010 for sinus node dysfunction. 83% A paced, <2% V paced. Device interrogation shows a stable burden of AF of around 1.3%. Normal lead parameters . Longevity approximately 7.3 years.  A couple of ventricular noise reversion episodes were detected. The patient does not recall the circumstances that may have triggered this. It did not significantly interfere with device function. 10 episode of reported pacemaker mediated tachycardia recorded but the electrograms are not available for review.  Has a history of multiple meningiomas, status post resection and gamma knife therapy. The patient has a history of seizures associated with this, well controlled. She has occasional headaches.     Past Medical History  Diagnosis Date  . Seizure 2006  . Chest pain     2D Echo 04/21/08 EF = >55%. Moderate tricuspid regurgitation. Persantine Myovoew stress test 04/21/08 EF = 81%, no evidence of ischemia noted.  . Coronary artery disease   . Dyslipidemia   . Chronic edema     Mild LE edema  . Severe obesity   . Chronic anticoagulation     PAF  . SOB (shortness of breath) on exertion     Class II. Catheterization 04/30/10 showed mild CAD with mildly elevated right heart pressures.   . Arthritis   . Back pain     Secondary to arthritis  . Mild depression   . Brain tumor     On right side. Removed in 1989  . Vertigo   . GERD (gastroesophageal reflux disease)   .  Chronic kidney disease     Mild, stage 2  . ACL (anterior cruciate ligament) rupture     Left  . Meningioma     Resection in 1998  . Ovarian cyst     Benign. Had a salpiingoophorectomy in 2005.  [name] Hyperlipidemia     On pravastatin  . PAF (paroxysmal atrial fibrillation)     On coumadin. St. Jude PPM (serial [id number]) inserted 2010  . Paroxysmal atrial flutter   . Sinus node dysfunction     S/P implantation of a dual-chamber permanent pacemaker in 2010  . Tachycardia-bradycardia syndrome     S/P implantation of  dual-chamber permanent pacemaker in 2010  . Sick sinus syndrome   . Bruit     Duplex doppler 03/28/05 Mildly abnormal. *Right subclavian artery: Less than 50% diameter reduction.  . Acute MI   . Coronary atherosclerosis     Minimal  . Memory difficulty 07/13/2014  . Trochlear nerve palsy     Right    Past Surgical History  Procedure Laterality Date  . Brain tumor removal  1989  . Craniotomy  1989    (Brain tumor) With gamma knife procedure as well.  . Pacemaker insertion  04/27/09    Implanted by Dr. Elisabeth Cara. [place] (serial [id number])  . Cardiac catheterization  04/30/10    Showed mild CAD with mildly elevated right heart pressures. (Dr. Elisabeth Cara)  . Knee surgery      Bilateral, arthroscopic     Current Outpatient Prescriptions  Medication Sig Dispense Refill  . allopurinol (ZYLOPRIM) 100 MG tablet Take 100 mg by mouth daily.    [name] aspirin 81 MG tablet Take 81 mg by mouth daily.    . colchicine 0.6 MG tablet Take 0.6 mg by mouth daily.    . ferrous sulfate 325 (65 FE) MG tablet Take 1 tablet (325 mg total) by mouth daily. 30 tablet 7  . hydrochlorothiazide (HYDRODIURIL) 25 MG tablet Take 25 mg by mouth daily.    . Multiple Vitamins-Minerals (MULTIVITAMIN PO) Take by mouth daily.    . nitroGLYCERIN (NITROLINGUAL) 0.4 MG/SPRAY spray Place 3 sprays under the tongue every 5 (five) minutes x 3 doses as needed for chest pain.    [name] omeprazole (PRILOSEC) 20 MG capsule Take 20 mg by mouth daily.    . potassium chloride (KLOR-CON) 8 MEQ tablet Take 16 mEq by mouth daily.    . pravastatin (PRAVACHOL) 20 MG tablet Take 20 mg by mouth at bedtime.    . topiramate (TOPAMAX) 50 MG tablet Take 50 mg by mouth 2 (two) times daily.    . traMADol (ULTRAM) 50 MG tablet Take 1 tablet (50 mg total) by mouth every 6 (six) hours as needed. 15 tablet 0  . verapamil (CALAN-SR) 120 MG CR tablet Take 120 mg by mouth at bedtime.    . Vitamin D, Ergocalciferol, (DRISDOL) 50000 UNITS CAPS capsule Take 1  capsule by mouth. Twice weekly  3  . warfarin (COUMADIN) 5 MG tablet Take 2.5-5 mg by mouth at bedtime. Take 2.5mg  ONLY on Mon & Friday Take 5mg  all other days including Sundays    . warfarin (COUMADIN) 5 MG tablet TAKE 1 TABLET BY MOUTH DAILY OR AS DIRECTED 90 tablet 1   No current facility-administered medications for this visit.    Allergies:   Dilantin and Phenobarbital    Social History:  The patient  reports that she quit smoking about 7 years ago. Her smoking use included Cigarettes. She  quit after 20 years of use. She does not have any smokeless tobacco history on file. She reports that she does not drink alcohol or use illicit drugs.   Family History:  The patient's family history includes Heart disease in her brother, brother, brother, father, sister, sister, and sister; Stroke in her mother.    ROS:  Please see the history of present illness.    Otherwise, review of systems positive for none.   All other systems are reviewed and negative.    PHYSICAL EXAM: VS:  BP 132/74 mmHg  Pulse 64  Ht 5\' 6"  (1.676 m)  Wt 225 lb (102.059 kg)  BMI 36.33 kg/m2 , BMI Body mass index is 36.33 kg/(m^2).  General: Alert, oriented x3, no distress Head: no evidence of trauma, PERRL, EOMI, no exophtalmos or lid lag, no myxedema, no xanthelasma; normal ears, nose and oropharynx Neck: normal jugular venous pulsations and no hepatojugular reflux; brisk carotid pulses without delay and no carotid bruits Chest: clear to auscultation, no signs of consolidation by percussion or palpation, normal fremitus, symmetrical and full respiratory excursions, healthy left subclavian pacemaker site  Cardiovascular: normal position and quality of the apical impulse, regular rhythm, normal first and second heart sounds, no murmurs, rubs or gallops Abdomen: no tenderness or distention, no masses by palpation, no abnormal pulsatility or arterial bruits, normal bowel sounds, no hepatosplenomegaly Extremities: no  clubbing, cyanosis or edema; 2+ radial, ulnar and brachial pulses bilaterally; 2+ right femoral, posterior tibial and dorsalis pedis pulses; 2+ left femoral, posterior tibial and dorsalis pedis pulses; no subclavian or femoral bruits Neurological: grossly nonfocal Psych: euthymic mood, full affect   EKG:  EKG is ordered today. The ekg ordered today demonstr atrial paced, ventricular sensed, otherwise normal    Recent Labs: 06/06/2014: Hemoglobin 11.0*; Platelets 225 06/14/2014: BUN 42*; Creatinine 1.74*; Potassium 4.1; Sodium 139    Lipid Panel No results found for: CHOL, TRIG, HDL, CHOLHDL, VLDL, LDLCALC, LDLDIRECT    Wt Readings from Last 3 Encounters:  04/10/15 225 lb (102.059 kg)  09/12/14 219 lb 14.4 oz (99.746 kg)  07/13/14 217 lb (98.431 kg)      ASSESSMENT AND PLAN:   Mrs. Schaus appears to have some new problems with exertional dyspnea that may be associated with weight gain/fluid retention. In the past she had echo documentation of abnormal diastolic relaxation parameters, but has never had Doppler signs of elevated filling pressures or  clinical heart failure. I would recommend an updated echocardiogram.She weighed 6 pounds more than she did in September, her physical exam does not show overt hypervolemia.  Strongly recommended a reduction in sodium intake. It'll be preferable to control her symptoms with lifestyle changes rather than loop diuretics since she also has grade 3 chronic kidney disease. Will retrieve her most recent labs from Dr. Willey Blade.  Continue warfarin for infrequent asymptomatic atrial fibrillation. The overall prevalence of atrial fibrillation remains low and unchanged from previous device checks. It cannot be incriminated in her new problems with exercise intolerance.   She is moderate to severely obese and I'm sure this is contributing to her dyspnea. She does not have symptoms to suggest obstructive sleep apnea, other than snoring.     Current medicines are reviewed at length with the patient today.  The patient does not have concerns regarding medicines.  The following changes have been made:  no change. She is instructed to pay more attention to the sodium content of food and limit her intake to foods that have  less than 10% sodium daily requirement per serving, preferably under 5%  Labs/ tests ordered today include:  Orders Placed This Encounter  Procedures  . Implantable device check  . EKG 12-Lead  . 2D Echocardiogram without contrast   Patient Instructions  Your physician has requested that you have an echocardiogram. Echocardiography is a painless test that uses sound waves to create images of your heart. It provides your doctor with information about the size and shape of your heart and how well your heart's chambers and valves are working. This procedure takes approximately one hour. There are no restrictions for this procedure.  Remote monitoring is used to monitor your Pacemaker from home. This monitoring reduces the number of office visits required to check your device to one time per year. It allows [name] to monitor the functioning of your device to ensure it is working properly. You are scheduled for a device check from home on July 11, 2015. You  may send your transmission at any time that day. If you have a wireless device, the transmission will be sent automatically. After your physician reviews your transmission, you will receive a postcard with your next transmission date.  Dr. Sallyanne Kuster recommends that you schedule a follow-up appointment in: 6 months.       Mikael Spray, MD  04/10/2015 3:53 PM    Sanda Klein, MD, [facility] [phone] office [phone] pager

## 2015-04-10 NOTE — Patient Instructions (Signed)
Your physician has requested that you have an echocardiogram. Echocardiography is a painless test that uses sound waves to create images of your heart. It provides your doctor with information about the size and shape of your heart and how well your heart's chambers and valves are working. This procedure takes approximately one hour. There are no restrictions for this procedure.  Remote monitoring is used to monitor your Pacemaker from home. This monitoring reduces the number of office visits required to check your device to one time per year. It allows [name] to monitor the functioning of your device to ensure it is working properly. You are scheduled for a device check from home on July 11, 2015. You  may send your transmission at any time that day. If you have a wireless device, the transmission will be sent automatically. After your physician reviews your transmission, you will receive a postcard with your next transmission date.  Dr. Sallyanne Kuster recommends that you schedule a follow-up appointment in: 6 months.

## 2015-04-19 ENCOUNTER — Ambulatory Visit (HOSPITAL_COMMUNITY)
Admission: RE | Admit: 2015-04-19 | Discharge: 2015-04-19 | Disposition: A | Discharge status: Home / Self Care | Payer: Medicare Other | Source: Ambulatory Visit | Attending: Cardiology | Admitting: Cardiology

## 2015-04-19 ENCOUNTER — Ambulatory Visit (INDEPENDENT_AMBULATORY_CARE_PROVIDER_SITE_OTHER): Payer: Medicare Other | Admitting: Pharmacist Clinician (PhC)/ Clinical Pharmacy Specialist

## 2015-04-19 DIAGNOSIS — Z7901 Long term (current) use of anticoagulants: Secondary | ICD-10-CM | POA: Diagnosis not present

## 2015-04-19 DIAGNOSIS — I208 Other forms of angina pectoris: Secondary | ICD-10-CM | POA: Diagnosis not present

## 2015-04-19 DIAGNOSIS — I480 Paroxysmal atrial fibrillation: Secondary | ICD-10-CM

## 2015-04-19 DIAGNOSIS — I4891 Unspecified atrial fibrillation: Secondary | ICD-10-CM

## 2015-04-19 DIAGNOSIS — R0602 Shortness of breath: Secondary | ICD-10-CM | POA: Insufficient documentation

## 2015-04-19 LAB — POCT INR: INR: 2.2

## 2015-04-19 NOTE — Progress Notes (Signed)
2D Echo Performed 04/19/2015    [name], RCS

## 2015-05-09 ENCOUNTER — Other Ambulatory Visit: Payer: Self-pay | Admitting: Pharmacist Clinician (PhC)/ Clinical Pharmacy Specialist

## 2015-05-18 ENCOUNTER — Ambulatory Visit (INDEPENDENT_AMBULATORY_CARE_PROVIDER_SITE_OTHER): Payer: Medicare Other | Admitting: Pharmacist Clinician (PhC)/ Clinical Pharmacy Specialist

## 2015-05-18 DIAGNOSIS — I4891 Unspecified atrial fibrillation: Secondary | ICD-10-CM | POA: Diagnosis not present

## 2015-05-18 DIAGNOSIS — I480 Paroxysmal atrial fibrillation: Secondary | ICD-10-CM | POA: Diagnosis not present

## 2015-05-18 DIAGNOSIS — Z7901 Long term (current) use of anticoagulants: Secondary | ICD-10-CM

## 2015-05-18 LAB — POCT INR: INR: 2.9

## 2015-06-29 ENCOUNTER — Ambulatory Visit (INDEPENDENT_AMBULATORY_CARE_PROVIDER_SITE_OTHER): Payer: Medicare Other | Admitting: Pharmacist Clinician (PhC)/ Clinical Pharmacy Specialist

## 2015-06-29 DIAGNOSIS — Z7901 Long term (current) use of anticoagulants: Secondary | ICD-10-CM | POA: Diagnosis not present

## 2015-06-29 DIAGNOSIS — I4891 Unspecified atrial fibrillation: Secondary | ICD-10-CM

## 2015-06-29 DIAGNOSIS — I480 Paroxysmal atrial fibrillation: Secondary | ICD-10-CM | POA: Diagnosis not present

## 2015-06-29 LAB — POCT INR: INR: 3.1

## 2015-07-10 ENCOUNTER — Ambulatory Visit (INDEPENDENT_AMBULATORY_CARE_PROVIDER_SITE_OTHER): Payer: Medicare Other | Admitting: *Deleted

## 2015-07-10 ENCOUNTER — Ambulatory Visit (INDEPENDENT_AMBULATORY_CARE_PROVIDER_SITE_OTHER): Payer: Medicare Other | Admitting: Adult Health

## 2015-07-10 ENCOUNTER — Encounter: Payer: Self-pay | Admitting: Cardiovascular Disease

## 2015-07-10 ENCOUNTER — Encounter: Payer: Self-pay | Admitting: Adult Health

## 2015-07-10 VITALS — BP 113/72 | HR 68 | Ht 65.0 in | Wt 221.0 lb

## 2015-07-10 DIAGNOSIS — Z8669 Personal history of other diseases of the nervous system and sense organs: Secondary | ICD-10-CM | POA: Diagnosis not present

## 2015-07-10 DIAGNOSIS — R569 Unspecified convulsions: Secondary | ICD-10-CM

## 2015-07-10 DIAGNOSIS — R413 Other amnesia: Secondary | ICD-10-CM | POA: Diagnosis not present

## 2015-07-10 DIAGNOSIS — I495 Sick sinus syndrome: Secondary | ICD-10-CM

## 2015-07-10 DIAGNOSIS — I208 Other forms of angina pectoris: Secondary | ICD-10-CM

## 2015-07-10 DIAGNOSIS — Z86011 Personal history of benign neoplasm of the brain: Secondary | ICD-10-CM
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
## 2015-07-10 NOTE — Patient Instructions (Signed)
Continue Topamax 10 mg twice a day.  If your symptoms worsen or you develop new symptoms please let us know.

## 2015-07-10 NOTE — Progress Notes (Signed)
Remote pacemaker transmission.   

## 2015-07-10 NOTE — Progress Notes (Signed)
I have read the note, and I agree with the clinical assessment and plan.  WILLIS,CHARLES KEITH   

## 2015-07-11 LAB — CUP PACEART REMOTE DEVICE CHECK
Battery Voltage: 2.92 V
Brady Statistic AP VS Percent: 86 %
Brady Statistic AS VS Percent: 12 %
Brady Statistic RV Percent Paced: 1.7 %
Lead Channel Impedance Value: 330 Ohm
Lead Channel Pacing Threshold Amplitude: 0.625 V
Lead Channel Pacing Threshold Pulse Width: 0.4 ms
Lead Channel Sensing Intrinsic Amplitude: 5 mV
Lead Channel Setting Pacing Amplitude: 1.625
Lead Channel Setting Pacing Pulse Width: 0.4 ms
Lead Channel Setting Sensing Sensitivity: 2 mV
MDC IDC MSMT BATTERY REMAINING LONGEVITY: 88 mo
MDC IDC MSMT BATTERY REMAINING PERCENTAGE: 73 %
MDC IDC MSMT LEADCHNL RA PACING THRESHOLD PULSEWIDTH: 0.4 ms
MDC IDC MSMT LEADCHNL RV IMPEDANCE VALUE: 510 Ohm
MDC IDC MSMT LEADCHNL RV PACING THRESHOLD AMPLITUDE: 1.5 V
MDC IDC MSMT LEADCHNL RV SENSING INTR AMPL: 12 mV
MDC IDC SESS DTM: 20160719060017
MDC IDC SET LEADCHNL RV PACING AMPLITUDE: 1.75 V
MDC IDC STAT BRADY AP VP PERCENT: 1.1 %
MDC IDC STAT BRADY AS VP PERCENT: 1 %
MDC IDC STAT BRADY RA PERCENT PACED: 84 %
Pulse Gen Serial Number: 2261146

## 2015-07-12 ENCOUNTER — Ambulatory Visit: Payer: Self-pay | Admitting: Adult Health

## 2015-07-16 ENCOUNTER — Ambulatory Visit: Payer: Medicare Other | Admitting: Adult Health

## 2015-07-28 ENCOUNTER — Other Ambulatory Visit: Payer: Self-pay | Admitting: Pharmacist Clinician (PhC)/ Clinical Pharmacy Specialist

## 2015-08-01 ENCOUNTER — Encounter: Payer: Self-pay | Admitting: Cardiology

## 2015-08-09 ENCOUNTER — Ambulatory Visit (INDEPENDENT_AMBULATORY_CARE_PROVIDER_SITE_OTHER): Payer: Medicare Other | Admitting: Pharmacist Clinician (PhC)/ Clinical Pharmacy Specialist

## 2015-08-09 DIAGNOSIS — I4891 Unspecified atrial fibrillation: Secondary | ICD-10-CM | POA: Diagnosis not present

## 2015-08-09 DIAGNOSIS — Z7901 Long term (current) use of anticoagulants: Secondary | ICD-10-CM | POA: Diagnosis not present

## 2015-08-09 DIAGNOSIS — I480 Paroxysmal atrial fibrillation: Secondary | ICD-10-CM

## 2015-08-09 LAB — POCT INR: INR: 3.6

## 2015-08-10 ENCOUNTER — Ambulatory Visit: Payer: Medicare Other | Admitting: Pharmacist Clinician (PhC)/ Clinical Pharmacy Specialist

## 2015-08-15 ENCOUNTER — Ambulatory Visit: Payer: Medicare Other | Admitting: Cardiovascular Disease

## 2015-08-31 ENCOUNTER — Ambulatory Visit (INDEPENDENT_AMBULATORY_CARE_PROVIDER_SITE_OTHER): Payer: Medicare Other | Admitting: Pharmacist Clinician (PhC)/ Clinical Pharmacy Specialist

## 2015-08-31 DIAGNOSIS — Z7901 Long term (current) use of anticoagulants: Secondary | ICD-10-CM | POA: Diagnosis not present

## 2015-08-31 DIAGNOSIS — I480 Paroxysmal atrial fibrillation: Secondary | ICD-10-CM | POA: Diagnosis not present

## 2015-08-31 DIAGNOSIS — I4891 Unspecified atrial fibrillation: Secondary | ICD-10-CM | POA: Diagnosis not present

## 2015-08-31 LAB — POCT INR: INR: 2.5

## 2015-09-19 DIAGNOSIS — R7309 Other abnormal glucose: Secondary | ICD-10-CM | POA: Insufficient documentation

## 2015-09-19 DIAGNOSIS — E559 Vitamin D deficiency, unspecified: Secondary | ICD-10-CM | POA: Insufficient documentation

## 2015-09-19 DIAGNOSIS — M109 Gout, unspecified: Secondary | ICD-10-CM | POA: Insufficient documentation

## 2015-09-21 ENCOUNTER — Ambulatory Visit (INDEPENDENT_AMBULATORY_CARE_PROVIDER_SITE_OTHER): Payer: Medicare Other | Admitting: Pharmacist Clinician (PhC)/ Clinical Pharmacy Specialist

## 2015-09-21 DIAGNOSIS — I480 Paroxysmal atrial fibrillation: Secondary | ICD-10-CM | POA: Diagnosis not present

## 2015-09-21 DIAGNOSIS — Z7901 Long term (current) use of anticoagulants: Secondary | ICD-10-CM

## 2015-09-21 DIAGNOSIS — I4891 Unspecified atrial fibrillation: Secondary | ICD-10-CM

## 2015-09-21 LAB — POCT INR: INR: 2.7

## 2015-10-09 ENCOUNTER — Ambulatory Visit (INDEPENDENT_AMBULATORY_CARE_PROVIDER_SITE_OTHER): Payer: Medicare Other | Admitting: Pharmacist Clinician (PhC)/ Clinical Pharmacy Specialist

## 2015-10-09 ENCOUNTER — Encounter: Payer: Self-pay | Admitting: Cardiovascular Disease

## 2015-10-09 ENCOUNTER — Ambulatory Visit (INDEPENDENT_AMBULATORY_CARE_PROVIDER_SITE_OTHER): Payer: Medicare Other | Admitting: Cardiovascular Disease

## 2015-10-09 ENCOUNTER — Ambulatory Visit: Payer: Medicare Other | Admitting: Pharmacist Clinician (PhC)/ Clinical Pharmacy Specialist

## 2015-10-09 VITALS — BP 146/62 | HR 84 | Ht 67.0 in | Wt 217.0 lb

## 2015-10-09 DIAGNOSIS — I4891 Unspecified atrial fibrillation: Secondary | ICD-10-CM

## 2015-10-09 DIAGNOSIS — Z7901 Long term (current) use of anticoagulants: Secondary | ICD-10-CM | POA: Diagnosis not present

## 2015-10-09 DIAGNOSIS — I480 Paroxysmal atrial fibrillation: Secondary | ICD-10-CM | POA: Diagnosis not present

## 2015-10-09 DIAGNOSIS — I209 Angina pectoris, unspecified: Secondary | ICD-10-CM

## 2015-10-09 DIAGNOSIS — I10 Essential (primary) hypertension: Secondary | ICD-10-CM

## 2015-10-09 LAB — POCT INR: INR: 3.2

## 2015-10-09 NOTE — Patient Instructions (Signed)
Your physician wants you to follow-up in: 6 Months. You will receive a reminder letter in the mail two months in advance. If you don't receive a letter, please call our office to schedule the follow-up appointment.  Remote monitoring is used to monitor your Pacemaker of ICD from home. This monitoring reduces the number of office visits required to check your device to one time per year. It allows [name] to keep an eye on the functioning of your device to ensure it is working properly. You are scheduled for a device check from home on 01/08/2016. You may send your transmission at any time that day. If you have a wireless device, the transmission will be sent automatically. After your physician reviews your transmission, you will receive a postcard with your next transmission date.

## 2015-10-10 DIAGNOSIS — M25519 Pain in unspecified shoulder: Secondary | ICD-10-CM | POA: Insufficient documentation

## 2015-10-11 LAB — CUP PACEART INCLINIC DEVICE CHECK
Battery Remaining Longevity: 80.4
Brady Statistic RA Percent Paced: 85 %
Brady Statistic RV Percent Paced: 1.8 %
Date Time Interrogation Session: 20161018153411
Implantable Lead Implant Date: 20100507
Implantable Lead Location: 753860
Lead Channel Impedance Value: 512.5 Ohm
Lead Channel Pacing Threshold Amplitude: 0.625 V
Lead Channel Pacing Threshold Amplitude: 1.125 V
Lead Channel Pacing Threshold Pulse Width: 0.4 ms
Lead Channel Pacing Threshold Pulse Width: 0.4 ms
Lead Channel Sensing Intrinsic Amplitude: 12 mV
Lead Channel Sensing Intrinsic Amplitude: 5 mV
Lead Channel Setting Pacing Amplitude: 1.375
Lead Channel Setting Pacing Pulse Width: 0.4 ms
Lead Channel Setting Sensing Sensitivity: 2 mV
MDC IDC LEAD IMPLANT DT: 20100507
MDC IDC LEAD LOCATION: 753859
MDC IDC MSMT BATTERY VOLTAGE: 2.9 V
MDC IDC MSMT LEADCHNL RA IMPEDANCE VALUE: 337.5 Ohm
MDC IDC PG SERIAL: 2261146
MDC IDC SET LEADCHNL RA PACING AMPLITUDE: 1.625

## 2015-10-11 NOTE — Progress Notes (Signed)
Patient ID: [name], female   DOB: 10-Nov-1932, 81 y.o.   MRN: [id number]     Cardiology Office Note   Date:  10/11/2015   ID:  [name], [name] [birth year], MRN [id number]  PCP:  Salena Saner., MD  Cardiologist:   Sanda Klein, MD   Chief Complaint  Patient presents with  . ECHO FOLLOW UP  . Dizziness  . Shortness of Breath      History of Present Illness: [name] is a 81 y.o. female who presents for  Follow-up of her dual-chamber permanent pacemaker.   The device was implanted in 2010 for tachycardia-bradycardia syndrome (paroxysmal atrial tachycardia , paroxysmal atrial fibrillation and symptomatic sinus bradycardia ).  Normal pacemaker interrogation. Estimated generator longevity 6.6 years. 85% atrial pacing, 1.9% ventricular pacing. Overall burden of atrial arrhythmia is 1.3% , mostly episodes of nonsustained paroxysmal atrial tachycardia.   She exercises daily on a stationary bicycle but feels dyspneic after exercising. Her major complaint is that she feels "slow" towards the end of the day.  Blood pressure is slightly high today, usually much lower at home.  He is therapeutically anticoagulated with warfarin and has had neither bleeding, nor neurological events. He has never had embolic events that we are aware of. She does have a history of seizures associated with resection of multiple meningiomas in the past.  She has no evidence of structural heart disease by previous echo and nuclear stress testing, had minimal CAD on angiography 2011. She has preserved left ventricular systolic function. There are subtle signs of diastolic function abnormality, but no clinical heart failure.    Past Medical History  Diagnosis Date  . Seizure ([place]) 2006  . Chest pain     2D Echo 04/21/08 EF = >55%. Moderate tricuspid regurgitation. Persantine Myovoew stress test 04/21/08 EF = 81%, no evidence of ischemia noted.  . Coronary artery disease   . Dyslipidemia     . Chronic edema     Mild LE edema  . Severe obesity ([place])   . Chronic anticoagulation     PAF  . SOB (shortness of breath) on exertion     Class II. Catheterization 04/30/10 showed mild CAD with mildly elevated right heart pressures.   . Arthritis   . Back pain     Secondary to arthritis  . Mild depression   . Brain tumor ([place])     On right side. Removed in 1989  . Vertigo   . GERD (gastroesophageal reflux disease)   . Chronic kidney disease     Mild, stage 2  . ACL (anterior cruciate ligament) rupture     Left  . Meningioma ([place])     Resection in 1998  . Ovarian cyst     Benign. Had a salpiingoophorectomy in 2005.  [name] Hyperlipidemia     On pravastatin  . PAF (paroxysmal atrial fibrillation) ([facility])     On coumadin. St. Jude PPM (serial [id number]) inserted 2010  . Paroxysmal atrial flutter ([place])   . Sinus node dysfunction ([facility])     S/P implantation of a dual-chamber permanent pacemaker in 2010  . Tachycardia-bradycardia syndrome ([place])     S/P implantation of dual-chamber permanent pacemaker in 2010  . Sick sinus syndrome ([place])   . Bruit     Duplex doppler 03/28/05 Mildly abnormal. *Right subclavian artery: Less than 50% diameter reduction.  . Acute MI ([place])   . Coronary atherosclerosis     Minimal  . Memory difficulty 07/13/2014  .  Trochlear nerve palsy     Right    Past Surgical History  Procedure Laterality Date  . Brain tumor removal  1989  . Craniotomy  1989    (Brain tumor) With gamma knife procedure as well.  . Pacemaker insertion  04/27/09    Implanted by Dr. Elisabeth Cara. [place] (serial [id number])  . Cardiac catheterization  04/30/10    Showed mild CAD with mildly elevated right heart pressures. (Dr. Elisabeth Cara)  . Knee surgery      Bilateral, arthroscopic     Current Outpatient Prescriptions  Medication Sig Dispense Refill  . allopurinol (ZYLOPRIM) 100 MG tablet Take 100 mg by mouth daily.    [name] aspirin 81 MG tablet Take 81 mg by mouth daily.    .  Coenzyme Q10 (CO Q 10 PO) Take by mouth.    . colchicine 0.6 MG tablet Take 0.6 mg by mouth daily.    . ferrous sulfate 325 (65 FE) MG tablet Take 1 tablet (325 mg total) by mouth daily. 30 tablet 7  . hydrochlorothiazide (HYDRODIURIL) 25 MG tablet Take 25 mg by mouth daily.    . Multiple Vitamins-Minerals (MULTIVITAMIN PO) Take by mouth daily.    . nitroGLYCERIN (NITROLINGUAL) 0.4 MG/SPRAY spray Place 3 sprays under the tongue every 5 (five) minutes x 3 doses as needed for chest pain.    [name] omeprazole (PRILOSEC) 20 MG capsule Take 20 mg by mouth daily.    . potassium chloride (KLOR-CON) 8 MEQ tablet Take 16 mEq by mouth daily.    . pravastatin (PRAVACHOL) 20 MG tablet Take 20 mg by mouth at bedtime.    . topiramate (TOPAMAX) 50 MG tablet Take 50 mg by mouth 2 (two) times daily.    . verapamil (CALAN-SR) 120 MG CR tablet Take 120 mg by mouth at bedtime.    . Vitamin D, Ergocalciferol, (DRISDOL) 50000 UNITS CAPS capsule Take 1 capsule by mouth. Twice weekly  3  . warfarin (COUMADIN) 5 MG tablet TAKE 1 TABLET BY MOUTH DAILY OR AS DIRECTED 90 tablet 1  . warfarin (COUMADIN) 5 MG tablet TAKE 1 TABLET BY MOUTH DAILY OR AS DIRECTED 90 tablet 1   No current facility-administered medications for this visit.    Allergies:   Dilantin and Phenobarbital    Social History:  The patient  reports that she quit smoking about 7 years ago. Her smoking use included Cigarettes. She quit after 20 years of use. She has never used smokeless tobacco. She reports that she does not drink alcohol or use illicit drugs.   Family History:  The patient's family history includes Heart disease in her brother, brother, brother, father, sister, sister, and sister; Stroke in her mother.    ROS:  Please see the history of present illness.    Otherwise, review of systems positive for none.   All other systems are reviewed and negative.    PHYSICAL EXAM: VS:  BP 146/62 mmHg  Pulse 84  Ht 5\' 7"  (1.702 m)  Wt 217 lb  (98.431 kg)  BMI 33.98 kg/m2 , BMI Body mass index is 33.98 kg/(m^2).  General: Alert, oriented x3, no distress Head: no evidence of trauma, PERRL, EOMI, no exophtalmos or lid lag, no myxedema, no xanthelasma; normal ears, nose and oropharynx Neck: normal jugular venous pulsations and no hepatojugular reflux; brisk carotid pulses without delay and no carotid bruits Chest: clear to auscultation, no signs of consolidation by percussion or palpation, normal fremitus, symmetrical and full respiratory excursions  Cardiovascular: normal position and quality of the apical impulse, regular rhythm, normal first and second heart sounds, no murmurs, rubs or gallops Abdomen: no tenderness or distention, no masses by palpation, no abnormal pulsatility or arterial bruits, normal bowel sounds, no hepatosplenomegaly Extremities: no clubbing, cyanosis or edema; 2+ radial, ulnar and brachial pulses bilaterally; 2+ right femoral, posterior tibial and dorsalis pedis pulses; 2+ left femoral, posterior tibial and dorsalis pedis pulses; no subclavian or femoral bruits Neurological: grossly nonfocal Psych: euthymic mood, full affect   EKG:  EKG is ordered today. The ekg ordered today demonstrates  Atrial paced, ventricular sensed with 2 episodes of brief 3-4 beats paroxysmal atrial tachycardia    Wt Readings from Last 3 Encounters:  10/09/15 217 lb (98.431 kg)  07/10/15 221 lb (100.245 kg)  04/10/15 225 lb (102.059 kg)     ASSESSMENT AND PLAN:  1.  Paroxysmal atrial fibrillation, low prevalence and essentially asymptomatic. Adequate rate control. On appropriate anticoagulation without bleeding problems  2.  Sinus node dysfunction with normally functioning dual-chamber permanent pacemaker. Continue remote pacemaker downloads every 3 months and yearly office visit  3.  Essential hypertension with fair control  4.   Obesity , continue efforts at weight loss and regular exercise.    Current medicines are  reviewed at length with the patient today.  The patient does not have concerns regarding medicines.  The following changes have been made:  no change  Labs/ tests ordered today include:  No orders of the defined types were placed in this encounter.     Patient Instructions  Your physician wants you to follow-up in: 6 Months. You will receive a reminder letter in the mail two months in advance. If you don't receive a letter, please call our office to schedule the follow-up appointment.  Remote monitoring is used to monitor your Pacemaker of ICD from home. This monitoring reduces the number of office visits required to check your device to one time per year. It allows [name] to keep an eye on the functioning of your device to ensure it is working properly. You are scheduled for a device check from home on 01/08/2016. You may send your transmission at any time that day. If you have a wireless device, the transmission will be sent automatically. After your physician reviews your transmission, you will receive a postcard with your next transmission date.        Mikael Spray, MD  10/11/2015 1:00 PM    Sanda Klein, MD, [facility] [phone] office [phone] pager

## 2015-10-23 ENCOUNTER — Encounter: Payer: Self-pay | Admitting: Cardiovascular Disease

## 2015-10-29 ENCOUNTER — Ambulatory Visit (INDEPENDENT_AMBULATORY_CARE_PROVIDER_SITE_OTHER): Payer: Medicare Other | Admitting: Pharmacist Clinician (PhC)/ Clinical Pharmacy Specialist

## 2015-10-29 DIAGNOSIS — Z7901 Long term (current) use of anticoagulants: Secondary | ICD-10-CM | POA: Diagnosis not present

## 2015-10-29 DIAGNOSIS — I4891 Unspecified atrial fibrillation: Secondary | ICD-10-CM

## 2015-10-29 DIAGNOSIS — I480 Paroxysmal atrial fibrillation: Secondary | ICD-10-CM

## 2015-10-29 LAB — POCT INR: INR: 2.9

## 2015-11-26 ENCOUNTER — Ambulatory Visit: Payer: Medicare Other | Admitting: Pharmacist Clinician (PhC)/ Clinical Pharmacy Specialist

## 2015-11-28 ENCOUNTER — Ambulatory Visit (INDEPENDENT_AMBULATORY_CARE_PROVIDER_SITE_OTHER): Payer: Medicare Other | Admitting: Pharmacist Clinician (PhC)/ Clinical Pharmacy Specialist

## 2015-11-28 DIAGNOSIS — I480 Paroxysmal atrial fibrillation: Secondary | ICD-10-CM

## 2015-11-28 DIAGNOSIS — I4891 Unspecified atrial fibrillation: Secondary | ICD-10-CM | POA: Diagnosis not present

## 2015-11-28 DIAGNOSIS — Z7901 Long term (current) use of anticoagulants: Secondary | ICD-10-CM | POA: Diagnosis not present

## 2015-11-28 LAB — POCT INR: INR: 3.7

## 2015-12-14 ENCOUNTER — Ambulatory Visit (INDEPENDENT_AMBULATORY_CARE_PROVIDER_SITE_OTHER): Payer: Medicare Other | Admitting: Pharmacist Clinician (PhC)/ Clinical Pharmacy Specialist

## 2015-12-14 DIAGNOSIS — I480 Paroxysmal atrial fibrillation: Secondary | ICD-10-CM

## 2015-12-14 DIAGNOSIS — I4891 Unspecified atrial fibrillation: Secondary | ICD-10-CM | POA: Diagnosis not present

## 2015-12-14 DIAGNOSIS — Z7901 Long term (current) use of anticoagulants: Secondary | ICD-10-CM

## 2015-12-14 LAB — POCT INR: INR: 2.8

## 2016-01-04 ENCOUNTER — Ambulatory Visit (INDEPENDENT_AMBULATORY_CARE_PROVIDER_SITE_OTHER): Payer: Medicare Other | Admitting: Pharmacist Clinician (PhC)/ Clinical Pharmacy Specialist

## 2016-01-04 DIAGNOSIS — Z7901 Long term (current) use of anticoagulants: Secondary | ICD-10-CM

## 2016-01-04 DIAGNOSIS — I4891 Unspecified atrial fibrillation: Secondary | ICD-10-CM

## 2016-01-04 DIAGNOSIS — I480 Paroxysmal atrial fibrillation: Secondary | ICD-10-CM | POA: Diagnosis not present

## 2016-01-04 LAB — POCT INR: INR: 2.5

## 2016-01-08 ENCOUNTER — Ambulatory Visit (INDEPENDENT_AMBULATORY_CARE_PROVIDER_SITE_OTHER): Payer: Medicare Other | Admitting: *Deleted

## 2016-01-08 DIAGNOSIS — I495 Sick sinus syndrome: Secondary | ICD-10-CM | POA: Diagnosis not present

## 2016-01-08 NOTE — Progress Notes (Signed)
Remote pacemaker transmission.   

## 2016-01-15 LAB — CUP PACEART REMOTE DEVICE CHECK
Battery Remaining Longevity: 77 mo
Brady Statistic AP VP Percent: 1.7 %
Brady Statistic AP VS Percent: 89 %
Brady Statistic AS VP Percent: 1 %
Brady Statistic AS VS Percent: 8 %
Brady Statistic RA Percent Paced: 85 %
Brady Statistic RV Percent Paced: 2.7 %
Date Time Interrogation Session: 20170117072729
Implantable Lead Implant Date: 20100507
Implantable Lead Location: 753860
Lead Channel Impedance Value: 330 Ohm
Lead Channel Pacing Threshold Amplitude: 1.375 V
Lead Channel Pacing Threshold Pulse Width: 0.4 ms
Lead Channel Pacing Threshold Pulse Width: 0.4 ms
Lead Channel Sensing Intrinsic Amplitude: 12 mV
Lead Channel Setting Sensing Sensitivity: 2 mV
MDC IDC LEAD IMPLANT DT: 20100507
MDC IDC LEAD LOCATION: 753859
MDC IDC MSMT BATTERY REMAINING PERCENTAGE: 65 %
MDC IDC MSMT BATTERY VOLTAGE: 2.9 V
MDC IDC MSMT LEADCHNL RA PACING THRESHOLD AMPLITUDE: 0.5 V
MDC IDC MSMT LEADCHNL RA SENSING INTR AMPL: 5 mV
MDC IDC MSMT LEADCHNL RV IMPEDANCE VALUE: 490 Ohm
MDC IDC SET LEADCHNL RA PACING AMPLITUDE: 1.5 V
MDC IDC SET LEADCHNL RV PACING AMPLITUDE: 1.625
MDC IDC SET LEADCHNL RV PACING PULSEWIDTH: 0.4 ms
Pulse Gen Model: 2210
Pulse Gen Serial Number: 2261146

## 2016-01-18 ENCOUNTER — Encounter: Payer: Self-pay | Admitting: Cardiology

## 2016-01-30 ENCOUNTER — Emergency Department (HOSPITAL_COMMUNITY)
Admission: EM | Admit: 2016-01-30 | Discharge: 2016-01-30 | Disposition: A | Discharge status: Home / Self Care | Payer: Medicare Other | Attending: Emergency Medicine | Admitting: Emergency Medicine

## 2016-01-30 ENCOUNTER — Emergency Department (HOSPITAL_COMMUNITY): Payer: Medicare Other

## 2016-01-30 ENCOUNTER — Encounter (HOSPITAL_COMMUNITY): Payer: Self-pay

## 2016-01-30 DIAGNOSIS — Z8669 Personal history of other diseases of the nervous system and sense organs: Secondary | ICD-10-CM | POA: Insufficient documentation

## 2016-01-30 DIAGNOSIS — Y9389 Activity, other specified: Secondary | ICD-10-CM | POA: Diagnosis not present

## 2016-01-30 DIAGNOSIS — F329 Major depressive disorder, single episode, unspecified: Secondary | ICD-10-CM | POA: Diagnosis not present

## 2016-01-30 DIAGNOSIS — Z86018 Personal history of other benign neoplasm: Secondary | ICD-10-CM | POA: Insufficient documentation

## 2016-01-30 DIAGNOSIS — Z8742 Personal history of other diseases of the female genital tract: Secondary | ICD-10-CM | POA: Diagnosis not present

## 2016-01-30 DIAGNOSIS — I2510 Atherosclerotic heart disease of native coronary artery without angina pectoris: Secondary | ICD-10-CM | POA: Insufficient documentation

## 2016-01-30 DIAGNOSIS — Z9889 Other specified postprocedural states: Secondary | ICD-10-CM | POA: Insufficient documentation

## 2016-01-30 DIAGNOSIS — N189 Chronic kidney disease, unspecified: Secondary | ICD-10-CM | POA: Insufficient documentation

## 2016-01-30 DIAGNOSIS — M1990 Unspecified osteoarthritis, unspecified site: Secondary | ICD-10-CM | POA: Diagnosis not present

## 2016-01-30 DIAGNOSIS — W19XXXA Unspecified fall, initial encounter: Secondary | ICD-10-CM

## 2016-01-30 DIAGNOSIS — Z7982 Long term (current) use of aspirin: Secondary | ICD-10-CM | POA: Insufficient documentation

## 2016-01-30 DIAGNOSIS — Y9289 Other specified places as the place of occurrence of the external cause: Secondary | ICD-10-CM | POA: Diagnosis not present

## 2016-01-30 DIAGNOSIS — I252 Old myocardial infarction: Secondary | ICD-10-CM | POA: Insufficient documentation

## 2016-01-30 DIAGNOSIS — Z87891 Personal history of nicotine dependence: Secondary | ICD-10-CM | POA: Diagnosis not present

## 2016-01-30 DIAGNOSIS — Y998 Other external cause status: Secondary | ICD-10-CM | POA: Diagnosis not present

## 2016-01-30 DIAGNOSIS — Z043 Encounter for examination and observation following other accident: Secondary | ICD-10-CM | POA: Insufficient documentation

## 2016-01-30 DIAGNOSIS — K219 Gastro-esophageal reflux disease without esophagitis: Secondary | ICD-10-CM | POA: Diagnosis not present

## 2016-01-30 DIAGNOSIS — W01198A Fall on same level from slipping, tripping and stumbling with subsequent striking against other object, initial encounter: Secondary | ICD-10-CM | POA: Insufficient documentation

## 2016-01-30 DIAGNOSIS — Z7901 Long term (current) use of anticoagulants: Secondary | ICD-10-CM | POA: Insufficient documentation

## 2016-01-30 DIAGNOSIS — E785 Hyperlipidemia, unspecified: Secondary | ICD-10-CM | POA: Diagnosis not present

## 2016-01-30 DIAGNOSIS — Z79899 Other long term (current) drug therapy: Secondary | ICD-10-CM | POA: Diagnosis not present

## 2016-01-30 LAB — PROTIME-INR
INR: 2.04 — ABNORMAL HIGH (ref 0.00–1.49)
Prothrombin Time: 22.9 seconds — ABNORMAL HIGH (ref 11.6–15.2)

## 2016-01-30 NOTE — ED Notes (Signed)
Attempted to obtain vitals on pt before pt was discharged. Pt was already dressed. Pt refusing to allow me to get vitals. Pt and family stated that the doctor said they could leave so theres no reason to get hooked back up to machine for vitals. Pt family very agitated and hostile during conversation. I have informed nurse. Pt family continues to sit outside of room looking around impatiently.

## 2016-01-30 NOTE — ED Provider Notes (Signed)
CSN: [id number]     Arrival date & time 01/30/16  89 History   First MD Initiated Contact with Patient 01/30/16 1345     Chief Complaint  Patient presents with  . Fall     (Consider location/radiation/quality/duration/timing/severity/associated sxs/prior Treatment) HPI Patient slipped and fell walking with slippery shoes on her floor striking her head. She offers no complaint. She was brought by EMS treated with cervical collar and removed. No other associated symptoms. No neck pain. Treated with warfarin for atrial fibrillation. Past Medical History  Diagnosis Date  . Seizure ([place]) 2006  . Chest pain     2D Echo 04/21/08 EF = >55%. Moderate tricuspid regurgitation. Persantine Myovoew stress test 04/21/08 EF = 81%, no evidence of ischemia noted.  . Coronary artery disease   . Dyslipidemia   . Chronic edema     Mild LE edema  . Severe obesity ([place])   . Chronic anticoagulation     PAF  . SOB (shortness of breath) on exertion     Class II. Catheterization 04/30/10 showed mild CAD with mildly elevated right heart pressures.   . Arthritis   . Back pain     Secondary to arthritis  . Mild depression   . Brain tumor ([place])     On right side. Removed in 1989  . Vertigo   . GERD (gastroesophageal reflux disease)   . Chronic kidney disease     Mild, stage 2  . ACL (anterior cruciate ligament) rupture     Left  . Meningioma ([place])     Resection in 1998  . Ovarian cyst     Benign. Had a salpiingoophorectomy in 2005.  [name] Hyperlipidemia     On pravastatin  . PAF (paroxysmal atrial fibrillation) (HCC)     On coumadin. St. Jude PPM (serial [id number]) inserted 2010  . Paroxysmal atrial flutter ([place])   . Sinus node dysfunction (HCC)     S/P implantation of a dual-chamber permanent pacemaker in 2010  . Tachycardia-bradycardia syndrome ([place])     S/P implantation of dual-chamber permanent pacemaker in 2010  . Sick sinus syndrome ([place])   . Bruit     Duplex doppler 03/28/05 Mildly abnormal. *Right  subclavian artery: Less than 50% diameter reduction.  . Acute MI ([place])   . Coronary atherosclerosis     Minimal  . Memory difficulty 07/13/2014  . Trochlear nerve palsy     Right   Past Surgical History  Procedure Laterality Date  . Brain tumor removal  1989  . Craniotomy  1989    (Brain tumor) With gamma knife procedure as well.  . Pacemaker insertion  04/27/09    Implanted by Dr. Elisabeth Cara. [place] (serial [id number])  . Cardiac catheterization  04/30/10    Showed mild CAD with mildly elevated right heart pressures. (Dr. Elisabeth Cara)  . Knee surgery      Bilateral, arthroscopic   Family History  Problem Relation Age of Onset  . Stroke Mother   . Heart disease Father   . Heart disease Sister   . Heart disease Brother   . Heart disease Brother   . Heart disease Brother   . Heart disease Sister   . Heart disease Sister    Social History  Substance Use Topics  . Smoking status: Former Smoker -- 20 years    Types: Cigarettes    Quit date: 12/23/2007  . Smokeless tobacco: Never Used  . Alcohol Use: No   OB History  No data available     Review of Systems  Constitutional: Negative.   HENT: Negative.   Respiratory: Negative.   Cardiovascular: Negative.   Gastrointestinal: Negative.   Musculoskeletal: Negative.   Skin: Negative.   Neurological: Negative.   Psychiatric/Behavioral: Negative.   All other systems reviewed and are negative.     Allergies  Dilantin and Phenobarbital  Home Medications   Prior to Admission medications   Medication Sig Start Date End Date Taking? Authorizing Provider  allopurinol (ZYLOPRIM) 100 MG tablet Take 100 mg by mouth daily.    Historical Provider, MD  aspirin 81 MG tablet Take 81 mg by mouth daily.    Historical Provider, MD  Coenzyme Q10 (CO Q 10 PO) Take by mouth.    Historical Provider, MD  colchicine 0.6 MG tablet Take 0.6 mg by mouth daily.    Historical Provider, MD  ferrous sulfate 325 (65 FE) MG tablet Take 1  tablet (325 mg total) by mouth daily. 02/19/15   Isaiah Serge, NP  hydrochlorothiazide (HYDRODIURIL) 25 MG tablet Take 25 mg by mouth daily.    Historical Provider, MD  Multiple Vitamins-Minerals (MULTIVITAMIN PO) Take by mouth daily.    Historical Provider, MD  nitroGLYCERIN (NITROLINGUAL) 0.4 MG/SPRAY spray Place 3 sprays under the tongue every 5 (five) minutes x 3 doses as needed for chest pain.    Historical Provider, MD  omeprazole (PRILOSEC) 20 MG capsule Take 20 mg by mouth daily.    Historical Provider, MD  potassium chloride (KLOR-CON) 8 MEQ tablet Take 16 mEq by mouth daily.    Historical Provider, MD  pravastatin (PRAVACHOL) 20 MG tablet Take 20 mg by mouth at bedtime.    Historical Provider, MD  topiramate (TOPAMAX) 50 MG tablet Take 50 mg by mouth 2 (two) times daily.    Historical Provider, MD  verapamil (CALAN-SR) 120 MG CR tablet Take 120 mg by mouth at bedtime.    Historical Provider, MD  Vitamin D, Ergocalciferol, (DRISDOL) 50000 UNITS CAPS capsule Take 1 capsule by mouth. Twice weekly 10/30/14   Historical Provider, MD  warfarin (COUMADIN) 5 MG tablet TAKE 1 TABLET BY MOUTH DAILY OR AS DIRECTED 05/10/15   Mihai Croitoru, MD  warfarin (COUMADIN) 5 MG tablet TAKE 1 TABLET BY MOUTH DAILY OR AS DIRECTED 07/30/15   Mihai Croitoru, MD   SpO2 98% Physical Exam  Constitutional: She appears well-developed and well-nourished.  HENT:  Head: Normocephalic and atraumatic.  Eyes: Conjunctivae are normal. Pupils are equal, round, and reactive to light.  Neck: Neck supple. No tracheal deviation present. No thyromegaly present.  Cardiovascular: Normal rate and regular rhythm.   No murmur heard. Pulmonary/Chest: Effort normal and breath sounds normal.  Abdominal: Soft. Bowel sounds are normal. She exhibits no distension. There is no tenderness.  Musculoskeletal: Normal range of motion. She exhibits no edema or tenderness.  Neurological: She is alert. No cranial nerve deficit. Coordination  normal.  Glascow coma score 15 motor strength 5 over 5 overall  Skin: Skin is warm and dry. No rash noted.  Psychiatric: She has a normal mood and affect.  Nursing note and vitals reviewed.   ED Course  Procedures (including critical care time) Labs Review Labs Reviewed  PROTIME-INR    Imaging Review No results found. I have personally reviewed and evaluated these images and lab results as part of my medical decision-making.   EKG Interpretation None     4:20 PM patient alert and relates without difficulty. Not lightheaded on standing.  Results for orders placed or performed during the hospital encounter of 01/30/16  Protime-INR  Result Value Ref Range   Prothrombin Time 22.9 (H) 11.6 - 15.2 seconds   INR 2.04 (H) 0.00 - 1.49   Ct Head Wo Contrast  01/30/2016  CLINICAL DATA:  Status post fall.  Patient on blood thinners. EXAM: CT HEAD WITHOUT CONTRAST TECHNIQUE: Contiguous axial images were obtained from the base of the skull through the vertex without intravenous contrast. COMPARISON:  10/17/2014 FINDINGS: There is no evidence of mass effect, midline shift, or extra-axial fluid collections. There is no evidence of a space-occupying lesion or intracranial hemorrhage. There is no evidence of a cortical-based area of acute infarction. There is an area of encephalomalacia in the right temporal lobe. There is generalized cerebral atrophy. There is periventricular white matter low attenuation likely secondary to microangiopathy. The ventricles and sulci are appropriate for the patient's age. The basal cisterns are patent. Visualized portions of the orbits are unremarkable. The visualized portions of the paranasal sinuses and mastoid air cells are unremarkable. Cerebrovascular atherosclerotic calcifications are noted. There is evidence of prior right temporal craniotomy. The calvarium is otherwise intact. IMPRESSION: No acute intracranial pathology. Electronically Signed   By: Kathreen Devoid    On: 01/30/2016 15:20    MDM  CT scan ordered as patient struck head on blood thinners Final diagnoses:  None   plan discharge to home. No follow-up needed Diagnosis #1 fall #2 minor closed head injury      Orlie Dakin, MD 01/30/16 1624

## 2016-01-30 NOTE — Discharge Instructions (Signed)
Return if concerned for any reason. Usual cane or walker if feeling unsteady to prevent falls. CT scan of your brain was normal. No evidence of bleeding. INR(Coumadin level) was 2.04 today, which is in the therapeutic range for Coumadin

## 2016-01-30 NOTE — ED Notes (Signed)
Pt. BIB EMS for evaluation after fall today. Pt. Had ground level mechanical fall. Pt. Denies LOC. Denies weakness/dizziness prior to event. Pt. Did hit head on door during fall. Pt. Is on coumadin.

## 2016-02-04 ENCOUNTER — Telehealth: Payer: Self-pay | Admitting: Cardiovascular Disease

## 2016-02-04 DIAGNOSIS — Z79899 Other long term (current) drug therapy: Secondary | ICD-10-CM

## 2016-02-04 DIAGNOSIS — I480 Paroxysmal atrial fibrillation: Secondary | ICD-10-CM

## 2016-02-04 NOTE — Telephone Encounter (Signed)
Her renal function (last checked in April) is borderline for 2.5 mg versus 5 mg. Has she had more recent labs? If not, can we please check BMET before we switch to Eliquis? Thank you

## 2016-02-04 NOTE — Telephone Encounter (Signed)
New message    Pt is on coum medications and she wants the dr to write a prescription for a new medication   From Warfarin to Eliquis   Please call pt before 11:30 or after 2pm

## 2016-02-04 NOTE — Telephone Encounter (Signed)
Spoke to patient She states she has had conversation with KRISTIN PATIENT WOULD LIKE TO SWITCH TO ELIQUIS   PATIENT AWARE WILL SEND INFORMATION TO DR NO:[id number] AND CALL HER WITH PRESCRIPTION - READY

## 2016-02-04 NOTE — Telephone Encounter (Signed)
If she can get the BMET done this week, have her keep her INR appointment with me for Feb 22.  We need to know what her INR is in order to make the switch, so best to do it at that appointment.

## 2016-02-04 NOTE — Telephone Encounter (Signed)
PATIENT AWARE SHE NEED TO GO TO LAB (FOR BMP) THIS WEEK NO APPOINTMENT IS NEEDED

## 2016-02-08 LAB — BASIC METABOLIC PANEL
BUN: 26 mg/dL — ABNORMAL HIGH (ref 7–25)
CO2: 20 mmol/L (ref 20–31)
Calcium: 9.8 mg/dL (ref 8.6–10.4)
Chloride: 105 mmol/L (ref 98–110)
Creat: 1.54 mg/dL — ABNORMAL HIGH (ref 0.60–0.88)
Glucose, Bld: 134 mg/dL — ABNORMAL HIGH (ref 65–99)
Potassium: 3.9 mmol/L (ref 3.5–5.3)
SODIUM: 140 mmol/L (ref 135–146)

## 2016-02-13 ENCOUNTER — Ambulatory Visit (INDEPENDENT_AMBULATORY_CARE_PROVIDER_SITE_OTHER): Payer: Medicare Other | Admitting: Pharmacist Clinician (PhC)/ Clinical Pharmacy Specialist

## 2016-02-13 DIAGNOSIS — I480 Paroxysmal atrial fibrillation: Secondary | ICD-10-CM | POA: Diagnosis not present

## 2016-02-13 DIAGNOSIS — Z7901 Long term (current) use of anticoagulants: Secondary | ICD-10-CM

## 2016-02-13 DIAGNOSIS — I4891 Unspecified atrial fibrillation: Secondary | ICD-10-CM

## 2016-02-13 LAB — POCT INR: INR: 2.9

## 2016-02-13 MED ORDER — APIXABAN 2.5 MG PO TABS: 2.5000 mg | ORAL_TABLET | Freq: Two times a day (BID) | ORAL | Status: DC

## 2016-02-15 ENCOUNTER — Encounter: Payer: Medicare Other | Admitting: Pharmacist Clinician (PhC)/ Clinical Pharmacy Specialist

## 2016-03-18 ENCOUNTER — Encounter: Payer: Self-pay | Admitting: Neurology

## 2016-03-18 ENCOUNTER — Ambulatory Visit (INDEPENDENT_AMBULATORY_CARE_PROVIDER_SITE_OTHER): Payer: Medicare Other | Admitting: Neurology

## 2016-03-18 VITALS — BP 137/61 | HR 68 | Ht 67.0 in | Wt 221.0 lb

## 2016-03-18 DIAGNOSIS — D429 Neoplasm of uncertain behavior of meninges, unspecified: Secondary | ICD-10-CM

## 2016-03-18 DIAGNOSIS — R413 Other amnesia: Secondary | ICD-10-CM | POA: Diagnosis not present

## 2016-03-18 DIAGNOSIS — H8110 Benign paroxysmal vertigo, unspecified ear: Secondary | ICD-10-CM | POA: Diagnosis not present

## 2016-03-18 DIAGNOSIS — R569 Unspecified convulsions: Secondary | ICD-10-CM

## 2016-03-18 HISTORY — DX: Benign paroxysmal vertigo, unspecified ear: H81.10

## 2016-03-18 NOTE — Progress Notes (Signed)
Reason for visit: Dizziness  Referring physician: Dr. Ollen Barges is a 82 y.o. female  History of present illness:  [name] is an 82 year old right-handed black female with a history of seizures, well controlled on Topamax. The patient has a history of multiple meningiomas, she has had a prior right craniotomy and some encephalomalacia of the anterior portion of the right temporal lobe. The patient had a fall around 01/30/2016, she fell forwards and hit a door with the head. The patient went to the emergency room, underwent a CT of the head that was unremarkable. She was on Coumadin at that time, she was switched to Eliquis approximately 2 or 3 weeks later. Within a week after being switched to Eliquis, the patient began developing episodes of vertigo that usually would start when she first got up in the morning. She has noted that if she looks down or looks up she can bring on the dizziness which is a spinning sensation. The patient has oscillopsia, no definite double vision with the vertigo. The patient may have some occasional headaches during the day over the last couple weeks. She denies any slurred speech, difficulty swallowing, numbness or weakness of extremities. She does have some gait instability associated with the vertigo. She recently has had a urinary tract infection, placed on an antibiotic within the last day or 2. The patient comes to this office for an evaluation. She has had a change in hearing in the left ear recently, she went to get her hearing aid checked. She denies any ear pain.  Past Medical History  Diagnosis Date  . Seizure ([place]) 2006  . Chest pain     2D Echo 04/21/08 EF = >55%. Moderate tricuspid regurgitation. Persantine Myovoew stress test 04/21/08 EF = 81%, no evidence of ischemia noted.  . Coronary artery disease   . Dyslipidemia   . Chronic edema     Mild LE edema  . Severe obesity ([place])   . Chronic anticoagulation     PAF  . SOB  (shortness of breath) on exertion     Class II. Catheterization 04/30/10 showed mild CAD with mildly elevated right heart pressures.   . Arthritis   . Back pain     Secondary to arthritis  . Mild depression   . Brain tumor ([place])     On right side. Removed in 1989  . Vertigo   . GERD (gastroesophageal reflux disease)   . Chronic kidney disease     Mild, stage 2  . ACL (anterior cruciate ligament) rupture     Left  . Meningioma ([place])     Resection in 1998  . Ovarian cyst     Benign. Had a salpiingoophorectomy in 2005.  [name] Hyperlipidemia     On pravastatin  . PAF (paroxysmal atrial fibrillation) (HCC)     On coumadin. St. Jude PPM (serial [id number]) inserted 2010  . Paroxysmal atrial flutter ([place])   . Sinus node dysfunction (HCC)     S/P implantation of a dual-chamber permanent pacemaker in 2010  . Tachycardia-bradycardia syndrome ([place])     S/P implantation of dual-chamber permanent pacemaker in 2010  . Sick sinus syndrome ([place])   . Bruit     Duplex doppler 03/28/05 Mildly abnormal. *Right subclavian artery: Less than 50% diameter reduction.  . Acute MI ([place])   . Coronary atherosclerosis     Minimal  . Memory difficulty 07/13/2014  . Trochlear nerve palsy     Right  .  Benign paroxysmal positional vertigo 03/18/2016    Past Surgical History  Procedure Laterality Date  . Brain tumor removal  1989  . Craniotomy  1989    (Brain tumor) With gamma knife procedure as well.  . Pacemaker insertion  04/27/09    Implanted by Dr. Elisabeth Cara. [place] (serial [id number])  . Cardiac catheterization  04/30/10    Showed mild CAD with mildly elevated right heart pressures. (Dr. Elisabeth Cara)  . Knee surgery      Bilateral, arthroscopic    Family History  Problem Relation Age of Onset  . Stroke Mother   . Heart disease Father   . Heart disease Sister   . Heart disease Brother   . Heart disease Brother   . Heart disease Brother   . Heart disease Sister   . Heart disease Sister      Social history:  reports that she quit smoking about 8 years ago. Her smoking use included Cigarettes. She quit after 20 years of use. She has never used smokeless tobacco. She reports that she does not drink alcohol or use illicit drugs.  Medications:  Prior to Admission medications   Medication Sig Start Date End Date Taking? Authorizing Provider  allopurinol (ZYLOPRIM) 100 MG tablet Take 100 mg by mouth daily.   Yes Historical Provider, MD  apixaban (ELIQUIS) 2.5 MG TABS tablet Take 1 tablet (2.5 mg total) by mouth 2 (two) times daily. 02/13/16  Yes Mihai Croitoru, MD  aspirin 81 MG tablet Take 81 mg by mouth daily.   Yes Historical Provider, MD  Coenzyme Q10 (CO Q 10 PO) Take by mouth.   Yes Historical Provider, MD  ferrous sulfate 325 (65 FE) MG tablet Take 1 tablet (325 mg total) by mouth daily. Patient taking differently: Take 325 mg by mouth every Monday, Wednesday, and Friday.  02/19/15  Yes Isaiah Serge, NP  hydrochlorothiazide (HYDRODIURIL) 25 MG tablet Take 25 mg by mouth daily.   Yes Historical Provider, MD  meclizine (ANTIVERT) 25 MG tablet TAKE 1 TABLET(S) 3 TIMES A DAY BY ORAL ROUTE AS NEEDED. 02/29/16  Yes Historical Provider, MD  Multiple Vitamins-Minerals (MULTIVITAMIN PO) Take by mouth daily.   Yes Historical Provider, MD  nitroGLYCERIN (NITROLINGUAL) 0.4 MG/SPRAY spray Place 3 sprays under the tongue every 5 (five) minutes x 3 doses as needed for chest pain.   Yes Historical Provider, MD  omeprazole (PRILOSEC) 20 MG capsule Take 20 mg by mouth daily.   Yes Historical Provider, MD  potassium chloride (KLOR-CON) 8 MEQ tablet Take 8 mEq by mouth daily.    Yes Historical Provider, MD  pravastatin (PRAVACHOL) 20 MG tablet Take 20 mg by mouth at bedtime.   Yes Historical Provider, MD  topiramate (TOPAMAX) 50 MG tablet Take 50 mg by mouth 2 (two) times daily.   Yes Historical Provider, MD  traMADol (ULTRAM) 50 MG tablet Take 50 mg by mouth every 6 (six) hours as needed for  moderate pain.   Yes Historical Provider, MD  verapamil (CALAN-SR) 120 MG CR tablet Take 120 mg by mouth at bedtime.   Yes Historical Provider, MD  Vitamin D, Ergocalciferol, (DRISDOL) 50000 UNITS CAPS capsule Take 1 capsule by mouth. Twice weekly 10/30/14  Yes Historical Provider, MD  colchicine 0.6 MG tablet Take 0.6 mg by mouth. Reported on 03/18/2016    Historical Provider, MD      Allergies  Allergen Reactions  . Dilantin [Phenytoin Sodium Extended] Hives  . Phenobarbital Hives  ROS:  Out of a complete 14 system review of symptoms, the patient complains only of the following symptoms, and all other reviewed systems are negative.  Fatigue Hearing loss, spending sensations Blurred vision Shortness of breath Easy bruising Joint pain Confusion, dizziness Decreased energy, disinterest in activities  Blood pressure 137/61, pulse 68, height 5\' 7"  (1.702 m), weight 221 lb (100.245 kg).  Physical Exam  General: The patient is alert and cooperative at the time of the examination. The patient is moderately obese.  Eyes: Pupils are equal, round, and reactive to light. Discs are flat bilaterally.  Ears: Tympanic membranes are clear.  Neck: The neck is supple, no carotid bruits are noted.  Respiratory: The respiratory examination is clear.  Cardiovascular: The cardiovascular examination reveals a regular rate and rhythm, no obvious murmurs or rubs are noted.  Skin: Extremities are without significant edema.  Neurologic Exam  Mental status: The patient is alert and oriented x 3 at the time of the examination. The patient has apparent normal recent and remote memory, with an apparently normal attention span and concentration ability.  Cranial nerves: Facial symmetry is present. There is good sensation of the face to pinprick and soft touch bilaterally. The strength of the facial muscles and the muscles to head turning and shoulder shrug are normal bilaterally. Speech is well  enunciated, no aphasia or dysarthria is noted. Extraocular movements are full. Visual fields are full. The tongue is midline, and the patient has symmetric elevation of the soft palate. No obvious hearing deficits are noted.  Motor: The motor testing reveals 5 over 5 strength of all 4 extremities. Good symmetric motor tone is noted throughout.  Sensory: Sensory testing is intact to pinprick, soft touch, vibration sensation, and position sense on all 4 extremities. No evidence of extinction is noted.  Coordination: Cerebellar testing reveals good finger-nose-finger and heel-to-shin bilaterally. The Nyan-Barrany procedure was performed, with the head back and to the left, the patient experienced subjective vertigo. The patient does have some horizontal nystagmus that is most prominent when looking to the left, less prominent when looking to the right. The vertigo passes within about 15 seconds.  Gait and station: Gait is normal. Tandem gait is normal. Romberg is negative. No drift is seen.  Reflexes: Deep tendon reflexes are symmetric and normal bilaterally. Toes are downgoing bilaterally.   Assessment/Plan:  1. History of seizures  2. Mild memory disturbance  3. New onset vertigo  The patient appears to have a positional component to the vertigo, she can bring on the symptoms with looking up or looking down. She did have a recent fall with a bump to the head, but the vertigo did not start for 2 or 3 weeks after this event. The patient will be sent for a CT of the head given the new symptoms. She cannot have MRI evaluation. She has a prescription for meclizine which she can take until she starts the physical therapy for vestibular rehabilitation. I will contact her with regarding the results of the CT the head, she will follow-up for her next scheduled revisit.  Jill Alexanders MD 03/18/2016 10:03 AM  [facility] [address] [place], [place]  [address]  Phone [phone] Fax [phone]

## 2016-03-18 NOTE — Patient Instructions (Signed)

## 2016-03-20 ENCOUNTER — Ambulatory Visit
Admission: RE | Admit: 2016-03-20 | Discharge: 2016-03-20 | Disposition: A | Discharge status: Home / Self Care | Payer: Medicare Other | Source: Ambulatory Visit | Attending: Neurology | Admitting: Neurology

## 2016-03-20 DIAGNOSIS — H8110 Benign paroxysmal vertigo, unspecified ear: Secondary | ICD-10-CM

## 2016-03-20 DIAGNOSIS — R569 Unspecified convulsions: Secondary | ICD-10-CM

## 2016-03-20 DIAGNOSIS — D429 Neoplasm of uncertain behavior of meninges, unspecified: Secondary | ICD-10-CM

## 2016-03-20 DIAGNOSIS — R413 Other amnesia: Secondary | ICD-10-CM

## 2016-03-21 ENCOUNTER — Telehealth: Payer: Self-pay | Admitting: Pharmacist Clinician (PhC)/ Clinical Pharmacy Specialist

## 2016-03-21 NOTE — Telephone Encounter (Signed)
Patient returned call.  Was switched to Eliquis last month.

## 2016-03-21 NOTE — Telephone Encounter (Signed)
Received letter from insurance that patient was given rx for smx/tmp, which can interact with warfarin.   LMOM for patient to let me know when she got the abx and for how many days will she be taking.  Will try to reach her again before end of day

## 2016-03-23 ENCOUNTER — Telehealth: Payer: Self-pay | Admitting: Neurology

## 2016-03-23 NOTE — Telephone Encounter (Signed)
  I called the patient. The CT of the head shows no change from  Prior study.  I discussed the results with her. She will be starting PT soon.   CT head 03/22/16:  IMPRESSION: Abnormal CT scan of the head showing right temporal bony defect with adjacent encephalomalacia consistent with prior history of meningioma resection. There are mild changes of chronic microvascular ischemia and generalized cerebral atrophy. No acute abnormalities are noted. Overall no significant change compared with previous CT scan of the head dated 10/17/2014.

## 2016-03-25 ENCOUNTER — Ambulatory Visit: Payer: Medicare Other | Admitting: Physical Therapy

## 2016-03-27 ENCOUNTER — Ambulatory Visit: Payer: Medicare Other | Attending: Neurology | Admitting: Physical Therapy

## 2016-03-27 DIAGNOSIS — R269 Unspecified abnormalities of gait and mobility: Secondary | ICD-10-CM | POA: Diagnosis not present

## 2016-03-27 DIAGNOSIS — R2689 Other abnormalities of gait and mobility: Secondary | ICD-10-CM | POA: Diagnosis present

## 2016-03-27 DIAGNOSIS — H8112 Benign paroxysmal vertigo, left ear: Secondary | ICD-10-CM | POA: Insufficient documentation

## 2016-03-27 NOTE — Therapy (Signed)
[facility] [address] [place], [place], [address] Phone: [phone]   Fax:  [phone]  Physical Therapy Evaluation  Patient Details  Name: [name] MRN: [id number] Date of Birth: [birth year] Referring Provider: Dr Floyde Parkins  Encounter Date: 03/27/2016      PT End of Session - 03/27/16 0936    Visit Number 1   Number of Visits 6   Date for PT Re-Evaluation 05/08/16   PT Start Time 0745   PT Stop Time 0830   PT Time Calculation (min) 45 min   Activity Tolerance Patient tolerated treatment well   Behavior During Therapy [facility] for tasks assessed/performed      Past Medical History  Diagnosis Date  . Seizure ([place]) 2006  . Chest pain     2D Echo 04/21/08 EF = >55%. Moderate tricuspid regurgitation. Persantine Myovoew stress test 04/21/08 EF = 81%, no evidence of ischemia noted.  . Coronary artery disease   . Dyslipidemia   . Chronic edema     Mild LE edema  . Severe obesity ([place])   . Chronic anticoagulation     PAF  . SOB (shortness of breath) on exertion     Class II. Catheterization 04/30/10 showed mild CAD with mildly elevated right heart pressures.   . Arthritis   . Back pain     Secondary to arthritis  . Mild depression   . Brain tumor ([place])     On right side. Removed in 1989  . Vertigo   . GERD (gastroesophageal reflux disease)   . Chronic kidney disease     Mild, stage 2  . ACL (anterior cruciate ligament) rupture     Left  . Meningioma ([place])     Resection in 1998  . Ovarian cyst     Benign. Had a salpiingoophorectomy in 2005.  [name] Hyperlipidemia     On pravastatin  . PAF (paroxysmal atrial fibrillation) (HCC)     On coumadin. St. Jude PPM (serial [id number]) inserted 2010  . Paroxysmal atrial flutter ([place])   . Sinus node dysfunction (HCC)     S/P implantation of a dual-chamber permanent pacemaker in 2010  . Tachycardia-bradycardia syndrome ([place])     S/P implantation of dual-chamber  permanent pacemaker in 2010  . Sick sinus syndrome ([place])   . Bruit     Duplex doppler 03/28/05 Mildly abnormal. *Right subclavian artery: Less than 50% diameter reduction.  . Acute MI ([place])   . Coronary atherosclerosis     Minimal  . Memory difficulty 07/13/2014  . Trochlear nerve palsy     Right  . Benign paroxysmal positional vertigo 03/18/2016    Past Surgical History  Procedure Laterality Date  . Brain tumor removal  1989  . Craniotomy  1989    (Brain tumor) With gamma knife procedure as well.  . Pacemaker insertion  04/27/09    Implanted by Dr. Elisabeth Cara. [place] (serial [id number])  . Cardiac catheterization  04/30/10    Showed mild CAD with mildly elevated right heart pressures. (Dr. Elisabeth Cara)  . Knee surgery      Bilateral, arthroscopic    There were no vitals filed for this visit.  Visit Diagnosis:  BPPV (benign paroxysmal positional vertigo), left - Plan: PT plan of care cert/re-cert  Abnormality of gait - Plan: PT plan of care cert/re-cert      Subjective Assessment - 03/27/16 0749    Subjective Pt is an 82 y/o female who presents  to OPPT for vertigo.  Pt states 20-30 year hx of intermittent vertigo exacerbated 2-3 weeks ago described as intense spinning.  Pt states she was unable to perform any housework; occurring several times a day.  Pt states it "seemed like it lasted forever, but it didn't."  Pt with hx of brain tumor s/p resection and states vertigo began after-unsure if this is related.   Pertinent History brain tumor s/p craniotomy/resection, OA, seizures, CAD, PAF, Pacemaker, obesity, depression, CKD, meningioma, HLD, MI   Limitations House hold activities   Patient Stated Goals improve symptoms, dizziness   Currently in Pain? No/denies            [facility] PT Assessment - 03/27/16 0755    Assessment   Medical Diagnosis vertigo   Referring Provider Dr Floyde Parkins   Onset Date/Surgical Date --  2-3 wks ago   Next MD Visit August   Prior Therapy in  [place]; many years ago for vertigo   Precautions   Precaution Comments no estim-hx of seizures and pacemaker   Balance Screen   Has the patient fallen in the past 6 months Yes   How many times? 1   Has the patient had a decrease in activity level because of a fear of falling?  Yes   Is the patient reluctant to leave their home because of a fear of falling?  No   Home Environment   Living Environment Private residence   Living Arrangements Children   Type of [place] to enter   Entrance Stairs-Number of Steps 2   [place] One level   Prior Function   Level of [place] Retired   [employer] retired from [employer]   Leisure Sunday School and church; movies, mental games in newspaper   Cognition   Overall Cognitive Status Within Functional Limits for tasks assessed   Observation/Other Assessments   Focus on Therapeutic Outcomes (FOTO)  60 (40% limited; predicted 17% limited)   Dizziness Handicap Inventory ([place])  60 (severe)   Ambulation/Gait   Ambulation/Gait Yes   Ambulation/Gait Assistance 5: Supervision   Ambulation Distance (Feet) 75 Feet   Assistive device None   Gait Pattern Wide base of support  reaching out for UE support; instability    Ambulation Surface Indoor            Vestibular Assessment - 03/27/16 0800    Symptom Behavior   Type of Dizziness Spinning  blurred vision; diplopia   Frequency of Dizziness 10 times/day   Duration of Dizziness seconds   Aggravating Factors Turning head quickly;Moving eyes   Relieving Factors Closing eyes  "blinking"   Occulomotor Exam   Occulomotor Alignment Normal   Spontaneous Absent   Gaze-induced Absent   Smooth Pursuits Saccades   Saccades Poor trajectory   Comment mild undershooting with saccades to L   Vestibulo-Occular Reflex   VOR 1 Head Only (x 1 viewing) WNL   Comment head thrust negative   Positional Testing   Dix-Hallpike  Dix-Hallpike Right;Dix-Hallpike Left   Horizontal Canal Testing Horizontal Canal Right;Horizontal Canal Left   Dix-Hallpike Right   Dix-Hallpike Right Duration none   Dix-Hallpike Right Symptoms No nystagmus   Dix-Hallpike Left   Dix-Hallpike Left Duration > 1 min   Dix-Hallpike Left Symptoms Upbeat, left rotatory nystagmus  very slow   Horizontal Canal Right   Horizontal Canal Right Duration 20 sec; possible rotary component to nystagmus  Horizontal Canal Right Symptoms Ageotrophic;Nystagmus   Horizontal Canal Left   Horizontal Canal Left Duration none   Horizontal Canal Left Symptoms Normal                Vestibular Treatment/Exercise - 03-28-16 0926    Vestibular Treatment/Exercise   Vestibular Treatment Provided Canalith Repositioning   Canalith Repositioning Epley Manuever Left    EPLEY MANUEVER LEFT   Number of Reps  1   Overall Response  Improved Symptoms    RESPONSE DETAILS LEFT head shaking performed prior due to possible cupulolithiasis               PT Education - March 28, 2016 0936    Education provided Yes   Education Details clinical findings; BPPV   Person(s) Educated Patient;Child(ren)   Methods Explanation   Comprehension Verbalized understanding          PT Short Term Goals - March 28, 2016 0954    PT SHORT TERM GOAL #1   Title formal balance testing to be performed with goals written as appropriate (04/17/16)   Time 3   Period Weeks   Status New           PT Long Term Goals - Mar 28, 2016 0951    PT LONG TERM GOAL #1   Title independent with HEP (05/08/16)   Time 6   Period Weeks   Status New   PT LONG TERM GOAL #2   Title demonstrate negative positional testing (05/08/16)   Time 6   Period Weeks   Status New               Plan - Mar 28, 2016 [id number]    Clinical Impression Statement Pt presents to OPPT for moderate complexity evaluation of vertigo.  Clinical findings suggestive of L post canal BPPV, possibly cupulolithiasis so head  shaking performed prior to canalith repositioning.  Pt also with slow beating apogeotropic nystagmus (possible rotary component; difficult to assess as pt continued to look around despite cues) with roll to R indicating possible horizontal canal involvement.  Pt demonstrates gross instability with ambulation and plan to further address balance once vertigo resolved.        Pt will benefit from skilled therapeutic intervention in order to improve on the following deficits Abnormal gait;Dizziness;Decreased balance   Rehab Potential Good   PT Frequency 1x / week   PT Duration 6 weeks   PT Treatment/Interventions ADLs/Self Care Home Management;Canalith Repostioning;Moist Heat;Cryotherapy;Patient/family education;Neuromuscular re-education;Balance training;Therapeutic exercise;Therapeutic activities;Functional mobility training;Stair training;Gait training;Vestibular   PT Next Visit Plan reassess BPPV; formal balance testing (BERG, DGI, timed up and go)   Consulted and Agree with Plan of Care Patient          G-Codes - March 28, 2016 0959    Functional Assessment Tool Used FOTO/DHI 60%   Functional Limitation Changing and maintaining body position   Changing and Maintaining Body Position Current Status [id number]) At least 60 percent but less than 80 percent impaired, limited or restricted   Changing and Maintaining Body Position Goal Status [id number]) At least 1 percent but less than 20 percent impaired, limited or restricted       Problem List Patient Active Problem List   Diagnosis Date Noted  . Meningioma, multiple ([place]) 03/18/2016  . Benign paroxysmal positional vertigo 03/18/2016  . Exertional dyspnea 04/10/2015  . Memory difficulty 07/13/2014  . Angina, class III ([place]) 06/06/2014  . CAD (coronary artery disease) 06/06/2014  . Gout flare of right elbow 06/06/2014  . HTN (hypertension) 08/30/2013  .  CKD (chronic kidney disease) stage 3, GFR 30-59 ml/min 08/30/2013  . Seizure ([place]) 08/30/2013  .  Obesity (BMI 30.0-34.9) 08/30/2013  . Pacemaker - dual chamber St. Jude RF device, implanted May 2010 08/30/2013  . Paroxysmal atrial fibrillation ([place]) 03/08/2013  . Long term (current) use of anticoagulants 03/08/2013   Laureen Abrahams, PT, DPT 03/27/2016 10:00 AM  [facility] [address] [place], [place], [address] Phone: [phone]   Fax:  [phone]  Name: LATAMARA RIPPER MRN: [id number] Date of Birth: 07-19-1932

## 2016-03-31 ENCOUNTER — Ambulatory Visit: Payer: Medicare Other | Admitting: Physical Therapy

## 2016-03-31 DIAGNOSIS — H8112 Benign paroxysmal vertigo, left ear: Secondary | ICD-10-CM

## 2016-03-31 DIAGNOSIS — R2689 Other abnormalities of gait and mobility: Secondary | ICD-10-CM

## 2016-03-31 NOTE — Therapy (Signed)
[facility] [address] [place], [place], [address] Phone: [phone]   Fax:  [phone]  Physical Therapy Treatment  Patient Details  Name: [name] MRN: [id number] Date of Birth: 1932/01/17 Referring Provider: Dr Floyde Parkins  Encounter Date: 03/31/2016      PT End of Session - 03/31/16 1448    Visit Number 2   Number of Visits 6   Date for PT Re-Evaluation 05/08/16   PT Start Time 1402   PT Stop Time 1438   PT Time Calculation (min) 36 min   Activity Tolerance Patient tolerated treatment well   Behavior During Therapy [facility] for tasks assessed/performed      Past Medical History  Diagnosis Date  . Seizure ([place]) 2006  . Chest pain     2D Echo 04/21/08 EF = >55%. Moderate tricuspid regurgitation. Persantine Myovoew stress test 04/21/08 EF = 81%, no evidence of ischemia noted.  . Coronary artery disease   . Dyslipidemia   . Chronic edema     Mild LE edema  . Severe obesity ([place])   . Chronic anticoagulation     PAF  . SOB (shortness of breath) on exertion     Class II. Catheterization 04/30/10 showed mild CAD with mildly elevated right heart pressures.   . Arthritis   . Back pain     Secondary to arthritis  . Mild depression   . Brain tumor ([place])     On right side. Removed in 1989  . Vertigo   . GERD (gastroesophageal reflux disease)   . Chronic kidney disease     Mild, stage 2  . ACL (anterior cruciate ligament) rupture     Left  . Meningioma ([place])     Resection in 1998  . Ovarian cyst     Benign. Had a salpiingoophorectomy in 2005.  [name] Hyperlipidemia     On pravastatin  . PAF (paroxysmal atrial fibrillation) (HCC)     On coumadin. St. Jude PPM (serial [id number]) inserted 2010  . Paroxysmal atrial flutter ([place])   . Sinus node dysfunction (HCC)     S/P implantation of a dual-chamber permanent pacemaker in 2010  . Tachycardia-bradycardia syndrome ([place])     S/P implantation of dual-chamber  permanent pacemaker in 2010  . Sick sinus syndrome ([place])   . Bruit     Duplex doppler 03/28/05 Mildly abnormal. *Right subclavian artery: Less than 50% diameter reduction.  . Acute MI ([place])   . Coronary atherosclerosis     Minimal  . Memory difficulty 07/13/2014  . Trochlear nerve palsy     Right  . Benign paroxysmal positional vertigo 03/18/2016    Past Surgical History  Procedure Laterality Date  . Brain tumor removal  1989  . Craniotomy  1989    (Brain tumor) With gamma knife procedure as well.  . Pacemaker insertion  04/27/09    Implanted by Dr. Elisabeth Cara. [place] (serial [id number])  . Cardiac catheterization  04/30/10    Showed mild CAD with mildly elevated right heart pressures. (Dr. Elisabeth Cara)  . Knee surgery      Bilateral, arthroscopic    There were no vitals filed for this visit.      Subjective Assessment - 03/31/16 1405    Subjective Has been "cross eyed" a few times since last session.  No spinning since last time.  Did have some L ear pain but resolved now.   Patient Stated Goals improve symptoms, dizziness  Currently in Pain? No/denies                Vestibular Assessment - 03/31/16 1445    Positional Testing   Dix-Hallpike Dix-Hallpike Right;Dix-Hallpike Left   Horizontal Canal Testing Horizontal Canal Right;Horizontal Canal Left   Dix-Hallpike Right   Dix-Hallpike Right Duration 20 sec   Dix-Hallpike Right Symptoms Upbeat, right rotatory nystagmus;Left nystagmus  slow beating with some horizontal nystagmus   Dix-Hallpike Left   Dix-Hallpike Left Duration none   Dix-Hallpike Left Symptoms No nystagmus   Horizontal Canal Right   Horizontal Canal Right Duration 5 sec   Horizontal Canal Right Symptoms Ageotrophic;Nystagmus   Horizontal Canal Left   Horizontal Canal Left Duration none   Horizontal Canal Left Symptoms Normal                  Vestibular Treatment/Exercise - 03/31/16 1447    Vestibular Treatment/Exercise   Canalith  Repositioning Epley Manuever Right;Canal Roll Left    EPLEY MANUEVER RIGHT   Number of Reps  1   Overall Response Improved Symptoms   Response Details  head shaking performed prior to repositioning   Canal Roll Left   Number of Reps  1   Overall Response  No change   Response Details  performed Kim et al maneuver for L HC cupulolithiasis               PT Education - 03/31/16 1448    Education provided Yes   Education Details BPPV   Person(s) Educated Patient;Child(ren)   Methods Explanation;Handout   Comprehension Verbalized understanding          PT Short Term Goals - 03/27/16 0954    PT SHORT TERM GOAL #1   Title formal balance testing to be performed with goals written as appropriate (04/17/16)   Time 3   Period Weeks   Status New           PT Long Term Goals - 03/27/16 0951    PT LONG TERM GOAL #1   Title independent with HEP (05/08/16)   Time 6   Period Weeks   Status New   PT LONG TERM GOAL #2   Title demonstrate negative positional testing (05/08/16)   Time 6   Period Weeks   Status New               Plan - 03/31/16 1448    Clinical Impression Statement Pt continues to have symptoms and nystagmus consistent with multicanal cupulothiasis.  Pt reports improved symptoms from last session.  Continues to be cautious with mobility and recommended increasing activity as tolerated.   PT Next Visit Plan reassess BPPV; formal balance testing (BERG, DGI, timed up and go)   Consulted and Agree with Plan of Care Patient;Family member/caregiver   Family Member Consulted daughter      Patient will benefit from skilled therapeutic intervention in order to improve the following deficits and impairments:     Visit Diagnosis: BPPV (benign paroxysmal positional vertigo), left  Other abnormalities of gait and mobility     Problem List Patient Active Problem List   Diagnosis Date Noted  . Meningioma, multiple ([place]) 03/18/2016  . Benign paroxysmal  positional vertigo 03/18/2016  . Exertional dyspnea 04/10/2015  . Memory difficulty 07/13/2014  . Angina, class III ([place]) 06/06/2014  . CAD (coronary artery disease) 06/06/2014  . Gout flare of right elbow 06/06/2014  . HTN (hypertension) 08/30/2013  . CKD (chronic kidney disease) stage 3, GFR  30-59 ml/min 08/30/2013  . Seizure ([place]) 08/30/2013  . Obesity (BMI 30.0-34.9) 08/30/2013  . Pacemaker - dual chamber St. Jude RF device, implanted May 2010 08/30/2013  . Paroxysmal atrial fibrillation ([place]) 03/08/2013  . Long term (current) use of anticoagulants 03/08/2013   Laureen Abrahams, PT, DPT 03/31/2016 2:51 PM  [facility] [address] [place], [place], [address] Phone: [phone]   Fax:  [phone]  Name: [name] MRN: [id number] Date of Birth: [birth year]

## 2016-04-01 NOTE — Addendum Note (Signed)
Addended by: Faustino Congress F on: 04/01/2016 10:06 AM   Modules accepted: Orders

## 2016-04-08 ENCOUNTER — Ambulatory Visit (INDEPENDENT_AMBULATORY_CARE_PROVIDER_SITE_OTHER): Payer: Medicare Other | Admitting: *Deleted

## 2016-04-08 ENCOUNTER — Ambulatory Visit: Payer: Medicare Other | Admitting: Physical Therapy

## 2016-04-08 DIAGNOSIS — I495 Sick sinus syndrome: Secondary | ICD-10-CM

## 2016-04-08 DIAGNOSIS — H8112 Benign paroxysmal vertigo, left ear: Secondary | ICD-10-CM | POA: Diagnosis not present

## 2016-04-08 DIAGNOSIS — R2689 Other abnormalities of gait and mobility: Secondary | ICD-10-CM

## 2016-04-08 NOTE — Therapy (Signed)
[facility] [address] [place], [place], [address] Phone: [phone]   Fax:  [phone]  Physical Therapy Treatment  Patient Details  Name: [name] MRN: [id number] Date of Birth: 16-Aug-1932 Referring Provider: Dr Floyde Parkins  Encounter Date: 04/08/2016      PT End of Session - 04/08/16 1914    Visit Number 3   Number of Visits 6   Date for PT Re-Evaluation 05/08/16   PT Start Time 0801   PT Stop Time [id number]   PT Time Calculation (min) 46 min      Past Medical History  Diagnosis Date  . Seizure ([place]) 2006  . Chest pain     2D Echo 04/21/08 EF = >55%. Moderate tricuspid regurgitation. Persantine Myovoew stress test 04/21/08 EF = 81%, no evidence of ischemia noted.  . Coronary artery disease   . Dyslipidemia   . Chronic edema     Mild LE edema  . Severe obesity ([place])   . Chronic anticoagulation     PAF  . SOB (shortness of breath) on exertion     Class II. Catheterization 04/30/10 showed mild CAD with mildly elevated right heart pressures.   . Arthritis   . Back pain     Secondary to arthritis  . Mild depression   . Brain tumor ([place])     On right side. Removed in 1989  . Vertigo   . GERD (gastroesophageal reflux disease)   . Chronic kidney disease     Mild, stage 2  . ACL (anterior cruciate ligament) rupture     Left  . Meningioma ([place])     Resection in 1998  . Ovarian cyst     Benign. Had a salpiingoophorectomy in 2005.  [name] Hyperlipidemia     On pravastatin  . PAF (paroxysmal atrial fibrillation) (HCC)     On coumadin. St. Jude PPM (serial [id number]) inserted 2010  . Paroxysmal atrial flutter ([place])   . Sinus node dysfunction (HCC)     S/P implantation of a dual-chamber permanent pacemaker in 2010  . Tachycardia-bradycardia syndrome ([place])     S/P implantation of dual-chamber permanent pacemaker in 2010  . Sick sinus syndrome ([place])   . Bruit     Duplex doppler 03/28/05 Mildly abnormal.  *Right subclavian artery: Less than 50% diameter reduction.  . Acute MI ([place])   . Coronary atherosclerosis     Minimal  . Memory difficulty 07/13/2014  . Trochlear nerve palsy     Right  . Benign paroxysmal positional vertigo 03/18/2016    Past Surgical History  Procedure Laterality Date  . Brain tumor removal  1989  . Craniotomy  1989    (Brain tumor) With gamma knife procedure as well.  . Pacemaker insertion  04/27/09    Implanted by Dr. Elisabeth Cara. [place] (serial [id number])  . Cardiac catheterization  04/30/10    Showed mild CAD with mildly elevated right heart pressures. (Dr. Elisabeth Cara)  . Knee surgery      Bilateral, arthroscopic    There were no vitals filed for this visit.      Subjective Assessment - 04/08/16 1908    Subjective Pt reports the spinning vertigo is gone but still has been "cross-eyed" at times - states this comes and goes   Pertinent History brain tumor s/p craniotomy/resection, OA, seizures, CAD, PAF, Pacemaker, obesity, depression, CKD, meningioma, HLD, MI   Patient Stated Goals improve symptoms, dizziness  [facility] Adult PT Treatment/Exercise - 04/08/16 0821    Dynamic Gait Index   Level Surface Mild Impairment   Change in Gait Speed Mild Impairment   Gait with Horizontal Head Turns Mild Impairment   Gait with Vertical Head Turns Moderate Impairment   Gait and Pivot Turn Severe Impairment   Step Over Obstacle Severe Impairment   Step Around Obstacles Mild Impairment   Steps Mild Impairment   Total Score 11   Timed Up and Go Test   TUG Normal TUG   Normal TUG (seconds) 17      NeuroRe-ed; L sidelying test (-) with no nystagmus and no c/o vertigo in test position; (+) R sidelying test with nystagmus and  c/o vertigo in test position Epley maneuver for R BPPV x 1 rep - no c/o vertigo and no nystagmus noted during maneuver  Instructed in forward, backward and side kicks and marching for HEP for balance -  instructed to perform at counter with UE support prn  Discussed benefits of use of cane for assistance with amb. To incr. Safety and to assist with visual problems -  depth perception         PT Education - 04/08/16 1913    Education provided Yes   Education Details discussed use of cane for safety with ambulation to decr. fall risk, esp. with visual deficits (depth perception problems); instructed in forward, back and side kicks and marching for balance HEP   Person(s) Educated Patient;Child(ren)   Methods Explanation;Demonstration   Comprehension Verbalized understanding          PT Short Term Goals - 03/27/16 0954    PT SHORT TERM GOAL #1   Title formal balance testing to be performed with goals written as appropriate (04/17/16)   Time 3   Period Weeks   Status New           PT Long Term Goals - 03/27/16 0951    PT LONG TERM GOAL #1   Title independent with HEP (05/08/16)   Time 6   Period Weeks   Status New   PT LONG TERM GOAL #2   Title demonstrate negative positional testing (05/08/16)   Time 6   Period Weeks   Status New               Plan - 04/08/16 1915    Clinical Impression Statement L BPPV appears to have resolved; pt initially had some minimal R rotary upbeating nystagmus - performed 1 rep of Epley's and pt reported no spinning vertigo after treatment, just continued c/o "cross-eyed"; pt reports problems with depth perception which appears to contribute to balance/mobility deficits   Rehab Potential Good   PT Frequency 1x / week   PT Duration 6 weeks   PT Treatment/Interventions ADLs/Self Care Home Management;Canalith Repostioning;Moist Heat;Cryotherapy;Patient/family education;Neuromuscular re-education;Balance training;Therapeutic exercise;Therapeutic activities;Functional mobility training;Stair training;Gait training;Vestibular   PT Next Visit Plan recheck BPPV (R):  check balance exercises given for HEP - give balance exercise pics for HEP    PT Home Exercise Plan kicks (3 direcitons) and marching   Consulted and Agree with Plan of Care Patient;Family member/caregiver   Family Member Consulted daughter      Patient will benefit from skilled therapeutic intervention in order to improve the following deficits and impairments:  Abnormal gait, Dizziness, Decreased balance  Visit Diagnosis: Other abnormalities of gait and mobility     Problem List Patient Active Problem List   Diagnosis Date Noted  . Meningioma, multiple ([place])  03/18/2016  . Benign paroxysmal positional vertigo 03/18/2016  . Exertional dyspnea 04/10/2015  . Memory difficulty 07/13/2014  . Angina, class III ([place]) 06/06/2014  . CAD (coronary artery disease) 06/06/2014  . Gout flare of right elbow 06/06/2014  . HTN (hypertension) 08/30/2013  . CKD (chronic kidney disease) stage 3, GFR 30-59 ml/min 08/30/2013  . Seizure ([place]) 08/30/2013  . Obesity (BMI 30.0-34.9) 08/30/2013  . Pacemaker - dual chamber St. Jude RF device, implanted May 2010 08/30/2013  . Paroxysmal atrial fibrillation ([place]) 03/08/2013  . Long term (current) use of anticoagulants 03/08/2013    Taggart Prasad, [place], PT 04/08/2016, 7:30 PM  [facility] [address] [place], [place], [address] Phone: [phone]   Fax:  [phone]  Name: EYANNA DARGA MRN: [id number] Date of Birth: 17-Apr-1932

## 2016-04-08 NOTE — Progress Notes (Signed)
Remote pacemaker transmission.   

## 2016-04-14 ENCOUNTER — Ambulatory Visit: Payer: Medicare Other | Admitting: Physical Therapy

## 2016-04-14 DIAGNOSIS — H8112 Benign paroxysmal vertigo, left ear: Secondary | ICD-10-CM | POA: Diagnosis not present

## 2016-04-14 DIAGNOSIS — R2689 Other abnormalities of gait and mobility: Secondary | ICD-10-CM

## 2016-04-14 NOTE — Therapy (Signed)
[facility] [address] [place], [place], [address] Phone: [phone]   Fax:  [phone]  Physical Therapy Treatment  Patient Details  Name: [name] MRN: [id number] Date of Birth: 1932-03-06 Referring Provider: Dr Floyde Parkins  Encounter Date: 04/14/2016      PT End of Session - 04/14/16 1420    Visit Number 4   Number of Visits 6   Date for PT Re-Evaluation 05/08/16   PT Start Time 1234   PT Stop Time 1315   PT Time Calculation (min) 41 min   Activity Tolerance Patient tolerated treatment well   Behavior During Therapy [facility] for tasks assessed/performed      Past Medical History  Diagnosis Date  . Seizure ([place]) 2006  . Chest pain     2D Echo 04/21/08 EF = >55%. Moderate tricuspid regurgitation. Persantine Myovoew stress test 04/21/08 EF = 81%, no evidence of ischemia noted.  . Coronary artery disease   . Dyslipidemia   . Chronic edema     Mild LE edema  . Severe obesity ([place])   . Chronic anticoagulation     PAF  . SOB (shortness of breath) on exertion     Class II. Catheterization 04/30/10 showed mild CAD with mildly elevated right heart pressures.   . Arthritis   . Back pain     Secondary to arthritis  . Mild depression   . Brain tumor ([place])     On right side. Removed in 1989  . Vertigo   . GERD (gastroesophageal reflux disease)   . Chronic kidney disease     Mild, stage 2  . ACL (anterior cruciate ligament) rupture     Left  . Meningioma ([place])     Resection in 1998  . Ovarian cyst     Benign. Had a salpiingoophorectomy in 2005.  [name] Hyperlipidemia     On pravastatin  . PAF (paroxysmal atrial fibrillation) (HCC)     On coumadin. St. Jude PPM (serial [id number]) inserted 2010  . Paroxysmal atrial flutter ([place])   . Sinus node dysfunction (HCC)     S/P implantation of a dual-chamber permanent pacemaker in 2010  . Tachycardia-bradycardia syndrome ([place])     S/P implantation of dual-chamber  permanent pacemaker in 2010  . Sick sinus syndrome ([place])   . Bruit     Duplex doppler 03/28/05 Mildly abnormal. *Right subclavian artery: Less than 50% diameter reduction.  . Acute MI ([place])   . Coronary atherosclerosis     Minimal  . Memory difficulty 07/13/2014  . Trochlear nerve palsy     Right  . Benign paroxysmal positional vertigo 03/18/2016    Past Surgical History  Procedure Laterality Date  . Brain tumor removal  1989  . Craniotomy  1989    (Brain tumor) With gamma knife procedure as well.  . Pacemaker insertion  04/27/09    Implanted by Dr. Elisabeth Cara. [place] (serial [id number])  . Cardiac catheterization  04/30/10    Showed mild CAD with mildly elevated right heart pressures. (Dr. Elisabeth Cara)  . Knee surgery      Bilateral, arthroscopic    There were no vitals filed for this visit.      Subjective Assessment - 04/14/16 1237    Subjective balance is getting better.  did really well iniitially on Sunday but by the time she got to church she had trouble reading and seeing things in church "had to close one eye."   Pertinent  History brain tumor s/p craniotomy/resection, OA, seizures, CAD, PAF, Pacemaker, obesity, depression, CKD, meningioma, HLD, MI   Limitations House hold activities   Patient Stated Goals improve symptoms, dizziness   Currently in Pain? No/denies                Vestibular Assessment - 04/14/16 1419    Positional Testing   Dix-Hallpike Dix-Hallpike Right;Dix-Hallpike Left   Horizontal Canal Testing Horizontal Canal Right;Horizontal Canal Left   Dix-Hallpike Right   Dix-Hallpike Right Duration none   Dix-Hallpike Right Symptoms No nystagmus   Dix-Hallpike Left   Dix-Hallpike Left Duration none   Dix-Hallpike Left Symptoms No nystagmus   Horizontal Canal Right   Horizontal Canal Right Duration none   Horizontal Canal Right Symptoms Normal   Horizontal Canal Left   Horizontal Canal Left Duration none   Horizontal Canal Left Symptoms  Normal         Neuro re-ed: sensory organization test performed with following results: Conditions: 1:  Above avg 2:  Above avg 3:  Above avg 4:  Fall on 1 and 2; above avg 3 5:  Fall x 3 6:  Fall x 3 Composite score:  36 (avg ~68) Sensory Analysis Som: above avg Vis: decr to ~ 25% (avg ~ 74) Vest: decr to ~ 0% (avg ~50) Pref: above avg Strategy analysis:      Ankle dominant COG alignment:      WNL                   PT Education - 04/14/16 1420    Education provided Yes   Education Details results of SOT and PT recommendations   Person(s) Educated Patient;Child(ren)   Methods Explanation   Comprehension Verbalized understanding          PT Short Term Goals - 04/14/16 1423    PT SHORT TERM GOAL #1   Title formal balance testing to be performed with goals written as appropriate (04/17/16)   Status Achieved           PT Long Term Goals - 04/14/16 1423    PT LONG TERM GOAL #1   Title independent with HEP (05/08/16)   Status On-going   PT LONG TERM GOAL #2   Title demonstrate negative positional testing (05/08/16)   Status Achieved   PT LONG TERM GOAL #3   Title improve dynamic gait index to >/= 16/24 for improved mobility (05/08/16)   Status New   PT LONG TERM GOAL #4   Title improve composite score on sensory organization test to >/= 45/56 for improved function and balance (05/08/16)   Status New               Plan - 04/14/16 1421    Clinical Impression Statement Pt demonstrated decreased vestibular and visual input on sensory organization test indicating highly reliant on somatosensory.  Recommended vision assessment and continued PT to address decreased vestibular input.  Positional testing negative.   PT Next Visit Plan corner balance exercises;continue counter exercises   Consulted and Agree with Plan of Care Patient;Family member/caregiver   Family Member Consulted daughter      Patient will benefit from skilled therapeutic  intervention in order to improve the following deficits and impairments:     Visit Diagnosis: Other abnormalities of gait and mobility  BPPV (benign paroxysmal positional vertigo), left     Problem List Patient Active Problem List   Diagnosis Date Noted  . Meningioma, multiple ([place]) 03/18/2016  .  Benign paroxysmal positional vertigo 03/18/2016  . Exertional dyspnea 04/10/2015  . Memory difficulty 07/13/2014  . Angina, class III ([place]) 06/06/2014  . CAD (coronary artery disease) 06/06/2014  . Gout flare of right elbow 06/06/2014  . HTN (hypertension) 08/30/2013  . CKD (chronic kidney disease) stage 3, GFR 30-59 ml/min 08/30/2013  . Seizure ([place]) 08/30/2013  . Obesity (BMI 30.0-34.9) 08/30/2013  . Pacemaker - dual chamber St. Jude RF device, implanted May 2010 08/30/2013  . Paroxysmal atrial fibrillation ([place]) 03/08/2013  . Long term (current) use of anticoagulants 03/08/2013    Siri Cole 04/14/2016, 2:25 PM  [facility] [address] [place], [place], [address] Phone: [phone]   Fax:  [phone]  Name: [name] MRN: [id number] Date of Birth: 1932-09-30

## 2016-04-18 ENCOUNTER — Encounter: Payer: Medicare Other | Admitting: Rehabilitative and Restorative Service Providers"

## 2016-04-25 ENCOUNTER — Ambulatory Visit: Payer: Medicare Other | Attending: Neurology | Admitting: Physical Therapy

## 2016-04-25 DIAGNOSIS — R2689 Other abnormalities of gait and mobility: Secondary | ICD-10-CM | POA: Insufficient documentation

## 2016-04-25 DIAGNOSIS — H8112 Benign paroxysmal vertigo, left ear: Secondary | ICD-10-CM | POA: Insufficient documentation

## 2016-04-25 NOTE — Therapy (Signed)
[facility] [address] [place], [place], [address] Phone: [phone]   Fax:  [phone]  Physical Therapy Treatment  Patient Details  Name: [name] MRN: [id number] Date of Birth: Aug 16, 1932 Referring Provider: Dr Floyde Parkins  Encounter Date: 04/25/2016      PT End of Session - 04/25/16 0918    Visit Number 5   Number of Visits 6   Date for PT Re-Evaluation 05/08/16   PT Start Time 0830   PT Stop Time 0910   PT Time Calculation (min) 40 min   Activity Tolerance Patient tolerated treatment well   Behavior During Therapy [facility] for tasks assessed/performed      Past Medical History  Diagnosis Date  . Seizure ([place]) 2006  . Chest pain     2D Echo 04/21/08 EF = >55%. Moderate tricuspid regurgitation. Persantine Myovoew stress test 04/21/08 EF = 81%, no evidence of ischemia noted.  . Coronary artery disease   . Dyslipidemia   . Chronic edema     Mild LE edema  . Severe obesity ([place])   . Chronic anticoagulation     PAF  . SOB (shortness of breath) on exertion     Class II. Catheterization 04/30/10 showed mild CAD with mildly elevated right heart pressures.   . Arthritis   . Back pain     Secondary to arthritis  . Mild depression   . Brain tumor ([place])     On right side. Removed in 1989  . Vertigo   . GERD (gastroesophageal reflux disease)   . Chronic kidney disease     Mild, stage 2  . ACL (anterior cruciate ligament) rupture     Left  . Meningioma ([place])     Resection in 1998  . Ovarian cyst     Benign. Had a salpiingoophorectomy in 2005.  [name] Hyperlipidemia     On pravastatin  . PAF (paroxysmal atrial fibrillation) (HCC)     On coumadin. St. Jude PPM (serial [id number]) inserted 2010  . Paroxysmal atrial flutter ([place])   . Sinus node dysfunction (HCC)     S/P implantation of a dual-chamber permanent pacemaker in 2010  . Tachycardia-bradycardia syndrome ([place])     S/P implantation of dual-chamber  permanent pacemaker in 2010  . Sick sinus syndrome ([place])   . Bruit     Duplex doppler 03/28/05 Mildly abnormal. *Right subclavian artery: Less than 50% diameter reduction.  . Acute MI ([place])   . Coronary atherosclerosis     Minimal  . Memory difficulty 07/13/2014  . Trochlear nerve palsy     Right  . Benign paroxysmal positional vertigo 03/18/2016    Past Surgical History  Procedure Laterality Date  . Brain tumor removal  1989  . Craniotomy  1989    (Brain tumor) With gamma knife procedure as well.  . Pacemaker insertion  04/27/09    Implanted by Dr. Elisabeth Cara. [place] (serial [id number])  . Cardiac catheterization  04/30/10    Showed mild CAD with mildly elevated right heart pressures. (Dr. Elisabeth Cara)  . Knee surgery      Bilateral, arthroscopic    There were no vitals filed for this visit.      Subjective Assessment - 04/25/16 0833    Subjective doing well no vertigo   Patient Stated Goals improve symptoms, dizziness   Currently in Pain? No/denies  Balance Exercises - 04/25/16 0836    Balance Exercises: Standing   Standing Eyes Opened Wide (BOA);Head turns;Foam/compliant surface  x10 each direction       Feet Apart (Compliant Surface) Head Motion - Eyes Open    With eyes open, standing on compliant surface: _pillow or cushion__, feet shoulder width apart, move head slowly: up and down 10 times and side to side 10 times. Repeat _1-2___ times per session. Do _2-3___ sessions per day.  Copyright  VHI. All rights reserved.    Feet Apart (Compliant Surface) Varied Arm Positions - Eyes Closed    Stand on compliant surface: __pillow or cushion___ with feet shoulder width apart and arms at your side. Close eyes and visualize upright position. Hold__10-15__ seconds. Repeat _5___ times per session. Do _2-3___ sessions per day.  Copyright  VHI. All rights reserved.   Feet Together, Head Motion - Eyes  Closed    With eyes closed and feet together, move head slowly, up and down 10 times and side to side 10 times. Repeat _1-2___ times per session. Do __2-3__ sessions per day.  Copyright  VHI. All rights reserved.      SLS activities with min A: single tap and double tap to 8" cones; standing on red balance beam alt taps forward x 15 with min A and 1 UE support-mod cues for technique.      PT Education - 04/25/16 [phone]    Education provided Yes   Education Details HEP   Person(s) Educated Patient;Child(ren)   Methods Explanation;Demonstration;Handout   Comprehension Verbalized understanding;Returned demonstration          PT Short Term Goals - 04/14/16 1423    PT SHORT TERM GOAL #1   Title formal balance testing to be performed with goals written as appropriate (04/17/16)   Status Achieved           PT Long Term Goals - 04/14/16 1423    PT LONG TERM GOAL #1   Title independent with HEP (05/08/16)   Status On-going   PT LONG TERM GOAL #2   Title demonstrate negative positional testing (05/08/16)   Status Achieved   PT LONG TERM GOAL #3   Title improve dynamic gait index to >/= 16/24 for improved mobility (05/08/16)   Status New   PT LONG TERM GOAL #4   Title improve composite score on sensory organization test to >/= 45/56 for improved function and balance (05/08/16)   Status New               Plan - 04/25/16 [id number]    Clinical Impression Statement Pt tolerated exercises well; needs min A for SLS activities.  Will continue to benefit from PT to maximize function.   PT Treatment/Interventions ADLs/Self Care Home Management;Canalith Repostioning;Moist Heat;Cryotherapy;Patient/family education;Neuromuscular re-education;Balance training;Therapeutic exercise;Therapeutic activities;Functional mobility training;Stair training;Gait training;Vestibular   PT Next Visit Plan corner balance exercises;continue counter exercises   PT Home Exercise Plan kicks (3 direcitons)  and marching   Consulted and Agree with Plan of Care Patient;Family member/caregiver   Family Member Consulted daughter      Patient will benefit from skilled therapeutic intervention in order to improve the following deficits and impairments:  Abnormal gait, Dizziness, Decreased balance  Visit Diagnosis: Other abnormalities of gait and mobility  BPPV (benign paroxysmal positional vertigo), left     Problem List Patient Active Problem List   Diagnosis Date Noted  . Meningioma, multiple ([place]) 03/18/2016  . Benign paroxysmal positional vertigo 03/18/2016  . Exertional dyspnea  04/10/2015  . Memory difficulty 07/13/2014  . Angina, class III ([place]) 06/06/2014  . CAD (coronary artery disease) 06/06/2014  . Gout flare of right elbow 06/06/2014  . HTN (hypertension) 08/30/2013  . CKD (chronic kidney disease) stage 3, GFR 30-59 ml/min 08/30/2013  . Seizure ([place]) 08/30/2013  . Obesity (BMI 30.0-34.9) 08/30/2013  . Pacemaker - dual chamber St. Jude RF device, implanted May 2010 08/30/2013  . Paroxysmal atrial fibrillation ([place]) 03/08/2013  . Long term (current) use of anticoagulants 03/08/2013    Laureen Abrahams, PT, DPT 04/25/2016 9:19 AM  [facility] [address] [place], [place], [address] Phone: [phone]   Fax:  [phone]  Name: DHVANI STEPHENSON MRN: [id number] Date of Birth: [birth year]

## 2016-04-25 NOTE — Patient Instructions (Signed)
Feet Apart (Compliant Surface) Head Motion - Eyes Open    With eyes open, standing on compliant surface: _pillow or cushion__, feet shoulder width apart, move head slowly: up and down 10 times and side to side 10 times. Repeat _1-2___ times per session. Do _2-3___ sessions per day.  Copyright  VHI. All rights reserved.    Feet Apart (Compliant Surface) Varied Arm Positions - Eyes Closed    Stand on compliant surface: __pillow or cushion___ with feet shoulder width apart and arms at your side. Close eyes and visualize upright position. Hold__10-15__ seconds. Repeat _5___ times per session. Do _2-3___ sessions per day.  Copyright  VHI. All rights reserved.   Feet Together, Head Motion - Eyes Closed    With eyes closed and feet together, move head slowly, up and down 10 times and side to side 10 times. Repeat _1-2___ times per session. Do __2-3__ sessions per day.  Copyright  VHI. All rights reserved.

## 2016-04-29 ENCOUNTER — Ambulatory Visit: Payer: Medicare Other | Admitting: Physical Therapy

## 2016-04-29 VITALS — BP 122/65 | HR 59

## 2016-04-29 DIAGNOSIS — R2689 Other abnormalities of gait and mobility: Secondary | ICD-10-CM | POA: Diagnosis not present

## 2016-04-29 DIAGNOSIS — H8112 Benign paroxysmal vertigo, left ear: Secondary | ICD-10-CM

## 2016-04-29 NOTE — Patient Instructions (Signed)
    STOP THE FIRST TWO EXERCISES GIVEN 04/25/16 AND PERFORM THESE INSTEAD.  CONTINUE TO PERFORM THE THIRD EXERCISE ON THE 04/25/16 HANDOUT.    Feet Together (Compliant Surface) Head Motion - Eyes Open    With eyes open, standing on compliant surface: _pillow or cushion___, feet together, move head slowly: up and down 10 times and side to side 10 times. Repeat _1-2___ times per session. Do __2-3__ sessions per day.  Copyright  VHI. All rights reserved.   Feet Together (Compliant Surface) Varied Arm Positions - Eyes Closed    Stand on compliant surface: _pillow or cushion_ with feet together and arms at your side. Close eyes and stand still. Hold_10-15__ seconds. Repeat __5__ times per session. Do _2-3___ sessions per day.  Copyright  VHI. All rights reserved.

## 2016-04-29 NOTE — Therapy (Signed)
[facility] [address] [place], [place], [address] Phone: [phone]   Fax:  [phone]  Physical Therapy Treatment  Patient Details  Name: [name] MRN: [id number] Date of Birth: 1932-12-19 Referring Provider: Dr Floyde Parkins  Encounter Date: 04/29/2016      PT End of Session - 04/29/16 0915    Visit Number 6   Number of Visits 8   Date for PT Re-Evaluation 05/16/16   PT Start Time 0745   PT Stop Time 0828   PT Time Calculation (min) 43 min   Activity Tolerance Patient tolerated treatment well   Behavior During Therapy [facility] for tasks assessed/performed      Past Medical History  Diagnosis Date  . Seizure ([place]) 2006  . Chest pain     2D Echo 04/21/08 EF = >55%. Moderate tricuspid regurgitation. Persantine Myovoew stress test 04/21/08 EF = 81%, no evidence of ischemia noted.  . Coronary artery disease   . Dyslipidemia   . Chronic edema     Mild LE edema  . Severe obesity ([place])   . Chronic anticoagulation     PAF  . SOB (shortness of breath) on exertion     Class II. Catheterization 04/30/10 showed mild CAD with mildly elevated right heart pressures.   . Arthritis   . Back pain     Secondary to arthritis  . Mild depression   . Brain tumor ([place])     On right side. Removed in 1989  . Vertigo   . GERD (gastroesophageal reflux disease)   . Chronic kidney disease     Mild, stage 2  . ACL (anterior cruciate ligament) rupture     Left  . Meningioma ([place])     Resection in 1998  . Ovarian cyst     Benign. Had a salpiingoophorectomy in 2005.  [name] Hyperlipidemia     On pravastatin  . PAF (paroxysmal atrial fibrillation) (HCC)     On coumadin. St. Jude PPM (serial [id number]) inserted 2010  . Paroxysmal atrial flutter ([place])   . Sinus node dysfunction (HCC)     S/P implantation of a dual-chamber permanent pacemaker in 2010  . Tachycardia-bradycardia syndrome ([place])     S/P implantation of dual-chamber  permanent pacemaker in 2010  . Sick sinus syndrome ([place])   . Bruit     Duplex doppler 03/28/05 Mildly abnormal. *Right subclavian artery: Less than 50% diameter reduction.  . Acute MI ([place])   . Coronary atherosclerosis     Minimal  . Memory difficulty 07/13/2014  . Trochlear nerve palsy     Right  . Benign paroxysmal positional vertigo 03/18/2016    Past Surgical History  Procedure Laterality Date  . Brain tumor removal  1989  . Craniotomy  1989    (Brain tumor) With gamma knife procedure as well.  . Pacemaker insertion  04/27/09    Implanted by Dr. Elisabeth Cara. [place] (serial [id number])  . Cardiac catheterization  04/30/10    Showed mild CAD with mildly elevated right heart pressures. (Dr. Elisabeth Cara)  . Knee surgery      Bilateral, arthroscopic    Filed Vitals:   04/29/16 0749  BP: 122/65  Pulse: 59        Subjective Assessment - 04/29/16 0747    Subjective feels weak today; hasn't taken K+ in a couple days due to running out of meds.  plans to have it today to take.  denies dizziness   Pertinent  History brain tumor s/p craniotomy/resection, OA, seizures, CAD, PAF, Pacemaker, obesity, depression, CKD, meningioma, HLD, MI   Limitations House hold activities   Patient Stated Goals improve symptoms, dizziness   Currently in Pain? No/denies                                   PT Short Term Goals - 04/14/16 1423    PT SHORT TERM GOAL #1   Title formal balance testing to be performed with goals written as appropriate (04/17/16)   Status Achieved           PT Long Term Goals - 04/29/16 [id number]    PT LONG TERM GOAL #1   Title independent with HEP (05/16/16)   Status On-going   PT LONG TERM GOAL #2   Title demonstrate negative positional testing (05/08/16)   Status Achieved   PT LONG TERM GOAL #3   Title improve dynamic gait index to >/= 16/24 for improved mobility (05/16/16)   Status On-going   PT LONG TERM GOAL #4   Title improve composite  score on sensory organization test to >/= 45/56 for improved function and balance (05/16/16)   Status On-going               Plan - 04/29/16 0915    Clinical Impression Statement Able to progress home exercises today as home exercises have become easy for pt.  Recommende continued Pt at 1x/wk x 2 more weeks to address decreased vestibular input and update HEP PRN.  Will continue to benefit from PT to maximize function.  Plan to continue LTGs #1, 3, 4 at this time.   Rehab Potential Good   PT Frequency 1x / week   PT Duration 3 weeks   PT Treatment/Interventions ADLs/Self Care Home Management;Canalith Repostioning;Moist Heat;Cryotherapy;Patient/family education;Neuromuscular re-education;Balance training;Therapeutic exercise;Therapeutic activities;Functional mobility training;Stair training;Gait training;Vestibular   PT Next Visit Plan corner balance exercises;continue counter exercises; compliant surface and balance activities   Consulted and Agree with Plan of Care Patient;Family member/caregiver   Family Member Consulted daughter      Patient will benefit from skilled therapeutic intervention in order to improve the following deficits and impairments:  Abnormal gait, Dizziness, Decreased balance  Visit Diagnosis: Other abnormalities of gait and mobility - Plan: PT plan of care cert/re-cert  BPPV (benign paroxysmal positional vertigo), left - Plan: PT plan of care cert/re-cert     Problem List Patient Active Problem List   Diagnosis Date Noted  . Meningioma, multiple ([place]) 03/18/2016  . Benign paroxysmal positional vertigo 03/18/2016  . Exertional dyspnea 04/10/2015  . Memory difficulty 07/13/2014  . Angina, class III ([place]) 06/06/2014  . CAD (coronary artery disease) 06/06/2014  . Gout flare of right elbow 06/06/2014  . HTN (hypertension) 08/30/2013  . CKD (chronic kidney disease) stage 3, GFR 30-59 ml/min 08/30/2013  . Seizure ([place]) 08/30/2013  . Obesity (BMI  30.0-34.9) 08/30/2013  . Pacemaker - dual chamber St. Jude RF device, implanted May 2010 08/30/2013  . Paroxysmal atrial fibrillation ([place]) 03/08/2013  . Long term (current) use of anticoagulants 03/08/2013   Laureen Abrahams, PT, DPT 04/29/2016 9:21 AM  [facility] [address] [place], [place], [address] Phone: [phone]   Fax:  [phone]  Name: [name] MRN: [id number] Date of Birth: 1932/05/12

## 2016-05-05 ENCOUNTER — Other Ambulatory Visit: Payer: Self-pay | Admitting: Cardiovascular Disease

## 2016-05-05 MED ORDER — FERROUS SULFATE 325 (65 FE) MG PO TABS: 325.0000 mg | ORAL_TABLET | ORAL | Status: DC

## 2016-05-05 NOTE — Telephone Encounter (Signed)
Rx request sent to pharmacy.  

## 2016-05-08 ENCOUNTER — Ambulatory Visit: Payer: Medicare Other | Admitting: Physical Therapy

## 2016-05-08 DIAGNOSIS — R2689 Other abnormalities of gait and mobility: Secondary | ICD-10-CM | POA: Diagnosis not present

## 2016-05-10 NOTE — Therapy (Signed)
[facility] [address] [place], [place], [address] Phone: [phone]   Fax:  [phone]  Physical Therapy Treatment  Patient Details  Name: [name] MRN: [id number] Date of Birth: 10-03-1932 Referring Provider: Dr Floyde Parkins  Encounter Date: 05/08/2016      PT End of Session - 05/10/16 1943    Visit Number 7   Number of Visits 8   Date for PT Re-Evaluation 05/16/16   PT Start Time 0801   PT Stop Time 0845   PT Time Calculation (min) 44 min      Past Medical History  Diagnosis Date  . Seizure ([place]) 2006  . Chest pain     2D Echo 04/21/08 EF = >55%. Moderate tricuspid regurgitation. Persantine Myovoew stress test 04/21/08 EF = 81%, no evidence of ischemia noted.  . Coronary artery disease   . Dyslipidemia   . Chronic edema     Mild LE edema  . Severe obesity ([place])   . Chronic anticoagulation     PAF  . SOB (shortness of breath) on exertion     Class II. Catheterization 04/30/10 showed mild CAD with mildly elevated right heart pressures.   . Arthritis   . Back pain     Secondary to arthritis  . Mild depression   . Brain tumor ([place])     On right side. Removed in 1989  . Vertigo   . GERD (gastroesophageal reflux disease)   . Chronic kidney disease     Mild, stage 2  . ACL (anterior cruciate ligament) rupture     Left  . Meningioma ([place])     Resection in 1998  . Ovarian cyst     Benign. Had a salpiingoophorectomy in 2005.  [name] Hyperlipidemia     On pravastatin  . PAF (paroxysmal atrial fibrillation) (HCC)     On coumadin. St. Jude PPM (serial [id number]) inserted 2010  . Paroxysmal atrial flutter ([place])   . Sinus node dysfunction (HCC)     S/P implantation of a dual-chamber permanent pacemaker in 2010  . Tachycardia-bradycardia syndrome ([place])     S/P implantation of dual-chamber permanent pacemaker in 2010  . Sick sinus syndrome ([place])   . Bruit     Duplex doppler 03/28/05 Mildly abnormal.  *Right subclavian artery: Less than 50% diameter reduction.  . Acute MI ([place])   . Coronary atherosclerosis     Minimal  . Memory difficulty 07/13/2014  . Trochlear nerve palsy     Right  . Benign paroxysmal positional vertigo 03/18/2016    Past Surgical History  Procedure Laterality Date  . Brain tumor removal  1989  . Craniotomy  1989    (Brain tumor) With gamma knife procedure as well.  . Pacemaker insertion  04/27/09    Implanted by Dr. Elisabeth Cara. [place] (serial [id number])  . Cardiac catheterization  04/30/10    Showed mild CAD with mildly elevated right heart pressures. (Dr. Elisabeth Cara)  . Knee surgery      Bilateral, arthroscopic    There were no vitals filed for this visit.      Subjective Assessment - 05/10/16 1942    Subjective Pt denies falls/changes since last visit - feels that PT has helped - is ready to finish up next week   Pertinent History brain tumor s/p craniotomy/resection, OA, seizures, CAD, PAF, Pacemaker, obesity, depression, CKD, meningioma, HLD, MI   Limitations House hold activities   Patient Stated Goals improve symptoms, dizziness  Currently in Pain? No/denies     NeuroRe-ed; pt performed SLS activities on floor  Including touching balance bubbles with each foot - straight ahead and then diagonals with CGA  Cone taps on red mat on floor - touching top with each foot  - 5 cones 2 reps with mod hand hand assist  Alternate tap ups to 4" step 10 reps each leg - to 2nd step as able with mod assist (min to CGA with 1st step taps)  Marching, forward and side kicks on red mat with min to mod hand held assist  Amb. 120' tossing ball for multi-tasking and incr. Dynamic balance with gait ; 30' backward tossing ball with min assist  Stepping over and back of lower height orange hurdle - 5 reps each leg with mod hand held assist  Alternate stepping up/back on incline - 5 reps each leg with mod hand held  assist                            PT Short Term Goals - 04/14/16 1423    PT SHORT TERM GOAL #1   Title formal balance testing to be performed with goals written as appropriate (04/17/16)   Status Achieved           PT Long Term Goals - 04/29/16 [id number]    PT LONG TERM GOAL #1   Title independent with HEP (05/16/16)   Status On-going   PT LONG TERM GOAL #2   Title demonstrate negative positional testing (05/08/16)   Status Achieved   PT LONG TERM GOAL #3   Title improve dynamic gait index to >/= 16/24 for improved mobility (05/16/16)   Status On-going   PT LONG TERM GOAL #4   Title improve composite score on sensory organization test to >/= 45/56 for improved function and balance (05/16/16)   Status On-going               Plan - 05/10/16 1943    Clinical Impression Statement Pt progressing towards LTG's - balance is good on noncompliant surface but pt appears anxious and nervous with balance activities on compliant surface; fatigues quickly, requiring seated rest breaks                                                       Rehab Potential Good   PT Frequency 1x / week   PT Duration 3 weeks   PT Treatment/Interventions ADLs/Self Care Home Management;Canalith Repostioning;Moist Heat;Cryotherapy;Patient/family education;Neuromuscular re-education;Balance training;Therapeutic exercise;Therapeutic activities;Functional mobility training;Stair training;Gait training;Vestibular   PT Next Visit Plan check LTG's, D/C   PT Home Exercise Plan kicks (3 direcitons) and marching   Consulted and Agree with Plan of Care Patient;Family member/caregiver      Patient will benefit from skilled therapeutic intervention in order to improve the following deficits and impairments:  Abnormal gait, Dizziness, Decreased balance  Visit Diagnosis: Other abnormalities of gait and mobility     Problem List Patient Active Problem List   Diagnosis Date Noted  . Meningioma,  multiple ([place]) 03/18/2016  . Benign paroxysmal positional vertigo 03/18/2016  . Exertional dyspnea 04/10/2015  . Memory difficulty 07/13/2014  . Angina, class III ([place]) 06/06/2014  . CAD (coronary artery disease) 06/06/2014  . Gout flare of right elbow 06/06/2014  .  HTN (hypertension) 08/30/2013  . CKD (chronic kidney disease) stage 3, GFR 30-59 ml/min 08/30/2013  . Seizure ([place]) 08/30/2013  . Obesity (BMI 30.0-34.9) 08/30/2013  . Pacemaker - dual chamber St. Jude RF device, implanted May 2010 08/30/2013  . Paroxysmal atrial fibrillation ([place]) 03/08/2013  . Long term (current) use of anticoagulants 03/08/2013    Lennard Capek, [place], PT 05/10/2016, 7:46 PM  [facility] [address] [place], [place], [address] Phone: [phone]   Fax:  [phone]  Name: ELLSA CEBULSKI MRN: [id number] Date of Birth: [birth year]

## 2016-05-12 ENCOUNTER — Ambulatory Visit: Payer: Medicare Other | Admitting: Physical Therapy

## 2016-05-12 DIAGNOSIS — H8112 Benign paroxysmal vertigo, left ear: Secondary | ICD-10-CM

## 2016-05-12 DIAGNOSIS — R2689 Other abnormalities of gait and mobility: Secondary | ICD-10-CM | POA: Diagnosis not present

## 2016-05-12 NOTE — Therapy (Signed)
[facility] [address] [place], [place], [address] Phone: [phone]   Fax:  [phone]  Physical Therapy Treatment  Patient Details  Name: [name] MRN: [id number] Date of Birth: [birth year] Referring Provider: Dr Floyde Parkins  Encounter Date: 05/12/2016      PT End of Session - 05/12/16 0922    Visit Number 8   PT Start Time 0744   PT Stop Time 0825   PT Time Calculation (min) 41 min   Activity Tolerance Patient tolerated treatment well   Behavior During Therapy [facility] for tasks assessed/performed      Past Medical History  Diagnosis Date  . Seizure ([place]) 2006  . Chest pain     2D Echo 04/21/08 EF = >55%. Moderate tricuspid regurgitation. Persantine Myovoew stress test 04/21/08 EF = 81%, no evidence of ischemia noted.  . Coronary artery disease   . Dyslipidemia   . Chronic edema     Mild LE edema  . Severe obesity ([place])   . Chronic anticoagulation     PAF  . SOB (shortness of breath) on exertion     Class II. Catheterization 04/30/10 showed mild CAD with mildly elevated right heart pressures.   . Arthritis   . Back pain     Secondary to arthritis  . Mild depression   . Brain tumor ([place])     On right side. Removed in 1989  . Vertigo   . GERD (gastroesophageal reflux disease)   . Chronic kidney disease     Mild, stage 2  . ACL (anterior cruciate ligament) rupture     Left  . Meningioma ([place])     Resection in 1998  . Ovarian cyst     Benign. Had a salpiingoophorectomy in 2005.  [name] Hyperlipidemia     On pravastatin  . PAF (paroxysmal atrial fibrillation) (HCC)     On coumadin. St. Jude PPM (serial [id number]) inserted 2010  . Paroxysmal atrial flutter ([place])   . Sinus node dysfunction (HCC)     S/P implantation of a dual-chamber permanent pacemaker in 2010  . Tachycardia-bradycardia syndrome ([place])     S/P implantation of dual-chamber permanent pacemaker in 2010  . Sick sinus syndrome ([place])   .  Bruit     Duplex doppler 03/28/05 Mildly abnormal. *Right subclavian artery: Less than 50% diameter reduction.  . Acute MI ([place])   . Coronary atherosclerosis     Minimal  . Memory difficulty 07/13/2014  . Trochlear nerve palsy     Right  . Benign paroxysmal positional vertigo 03/18/2016    Past Surgical History  Procedure Laterality Date  . Brain tumor removal  1989  . Craniotomy  1989    (Brain tumor) With gamma knife procedure as well.  . Pacemaker insertion  04/27/09    Implanted by Dr. Elisabeth Cara. [place] (serial [id number])  . Cardiac catheterization  04/30/10    Showed mild CAD with mildly elevated right heart pressures. (Dr. Elisabeth Cara)  . Knee surgery      Bilateral, arthroscopic    There were no vitals filed for this visit.      Subjective Assessment - 05/12/16 0921    Subjective doing well; ready to d/c today   Pertinent History brain tumor s/p craniotomy/resection, OA, seizures, CAD, PAF, Pacemaker, obesity, depression, CKD, meningioma, HLD, MI   Patient Stated Goals improve symptoms, dizziness   Currently in Pain? No/denies  [facility] PT Assessment - 06/07/16 0923    Observation/Other Assessments   Focus on Therapeutic Outcomes (FOTO)  50 (50% limited)   Dizziness Handicap Inventory (DHI)  44 (moderate)                     [facility] Adult PT Treatment/Exercise - 06/07/16 0756    Dynamic Gait Index   Level Surface Normal   Change in Gait Speed Normal   Gait with Horizontal Head Turns Normal   Gait with Vertical Head Turns Mild Impairment   Gait and Pivot Turn Mild Impairment   Step Over Obstacle Moderate Impairment   Step Around Obstacles Normal   Steps Mild Impairment   Total Score 19      Neuro re-ed: sensory organization test performed with following results: Conditions: 1:  Above avg 2:  Above avg 3:  Above avg 4: above avg 5: fall x 3 (almost completed trial 3) 6: above avg trial 1 (fall trial 2 and 3) Composite score:   55 Sensory Analysis Som:  Above avg Vis:  Above avg Vest:  0% Pref: WNL Strategy analysis:      Ankle dominant COG alignment:      WNL   Seated rest breaks needed due to fatigue and anxiety with testing.          PT Education - 06/07/2016 [phone]    Education provided Yes   Education Details results of SOT today v/s 1 month ago   Person(s) Educated Patient;Caregiver(s)   Methods Explanation;Handout   Comprehension Verbalized understanding          PT Short Term Goals - 04/14/16 1423    PT SHORT TERM GOAL #1   Title formal balance testing to be performed with goals written as appropriate (04/17/16)   Status Achieved           PT Long Term Goals - 06-07-2016 7121    PT LONG TERM GOAL #1   Title independent with HEP (05/16/16)   Status Achieved   PT LONG TERM GOAL #2   Title demonstrate negative positional testing (05/08/16)   Status Achieved   PT LONG TERM GOAL #3   Title improve dynamic gait index to >/= 16/24 for improved mobility (05/16/16)   Status Achieved   PT LONG TERM GOAL #4   Title improve composite score on sensory organization test to >/= 45/56 for improved function and balance (05/16/16)   Status Achieved               Plan - 2016/06/07 0924    Clinical Impression Statement Pt has met all LTGs and is ready for d/c.  See d/c summary.   PT Next Visit Plan d/c today   Consulted and Agree with Plan of Care Patient;Family member/caregiver   Family Member Consulted daughter      Patient will benefit from skilled therapeutic intervention in order to improve the following deficits and impairments:     Visit Diagnosis: Other abnormalities of gait and mobility  BPPV (benign paroxysmal positional vertigo), left       G-Codes - Jun 07, 2016 0925    Functional Assessment Tool Used FOTO/DHI 44%   Functional Limitation Changing and maintaining body position   Changing and Maintaining Body Position Goal Status (F7588) At least 1 percent but less than 20  percent impaired, limited or restricted   Changing and Maintaining Body Position Discharge Status (T2549) At least 40 percent but less than 60 percent impaired, limited or restricted  Problem List Patient Active Problem List   Diagnosis Date Noted  . Meningioma, multiple ([place]) 03/18/2016  . Benign paroxysmal positional vertigo 03/18/2016  . Exertional dyspnea 04/10/2015  . Memory difficulty 07/13/2014  . Angina, class III ([place]) 06/06/2014  . CAD (coronary artery disease) 06/06/2014  . Gout flare of right elbow 06/06/2014  . HTN (hypertension) 08/30/2013  . CKD (chronic kidney disease) stage 3, GFR 30-59 ml/min 08/30/2013  . Seizure ([place]) 08/30/2013  . Obesity (BMI 30.0-34.9) 08/30/2013  . Pacemaker - dual chamber St. Jude RF device, implanted May 2010 08/30/2013  . Paroxysmal atrial fibrillation ([place]) 03/08/2013  . Long term (current) use of anticoagulants 03/08/2013   Laureen Abrahams, PT, DPT 05/12/2016 12:14 PM  [facility] [address] [place], [place], [address] Phone: [phone]   Fax:  [phone]  Name: [name] MRN: [id number] Date of Birth: [birth year]            PHYSICAL THERAPY DISCHARGE SUMMARY  Visits from Start of Care: 8  Current functional level related to goals / functional outcomes: See above - all goals met   Remaining deficits: Pt continues to demonstrate decreased vestibular input on sensory organization test, therefore compensatory strategies recommended as well as continued HEP to improve vestibular input.   Education / Equipment: HEP  Plan: Patient agrees to discharge.  Patient goals were met. Patient is being discharged due to meeting the stated rehab goals.  ?????     Laureen Abrahams, PT, DPT 05/12/2016 12:17 PM   [facility] [address] [place], [place] [address]  [phone] (office) [phone] (fax)

## 2016-05-16 ENCOUNTER — Encounter: Payer: Self-pay | Admitting: Cardiology

## 2016-05-16 LAB — CUP PACEART REMOTE DEVICE CHECK
Battery Remaining Longevity: 78 mo
Battery Remaining Percentage: 65 %
Battery Voltage: 2.9 V
Brady Statistic AP VS Percent: 89 %
Brady Statistic AS VP Percent: 1 %
Brady Statistic AS VS Percent: 8.4 %
Brady Statistic RA Percent Paced: 85 %
Brady Statistic RV Percent Paced: 2.3 %
Date Time Interrogation Session: 20170418060014
Implantable Lead Implant Date: 20100507
Implantable Lead Location: 753859
Implantable Lead Location: 753860
Lead Channel Impedance Value: 330 Ohm
Lead Channel Pacing Threshold Amplitude: 0.625 V
Lead Channel Pacing Threshold Pulse Width: 0.4 ms
Lead Channel Sensing Intrinsic Amplitude: 5 mV
Lead Channel Setting Sensing Sensitivity: 2 mV
MDC IDC LEAD IMPLANT DT: 20100507
MDC IDC MSMT LEADCHNL RV IMPEDANCE VALUE: 490 Ohm
MDC IDC MSMT LEADCHNL RV PACING THRESHOLD AMPLITUDE: 1.25 V
MDC IDC MSMT LEADCHNL RV PACING THRESHOLD PULSEWIDTH: 0.4 ms
MDC IDC MSMT LEADCHNL RV SENSING INTR AMPL: 12 mV
MDC IDC SET LEADCHNL RA PACING AMPLITUDE: 1.625
MDC IDC SET LEADCHNL RV PACING AMPLITUDE: 1.5 V
MDC IDC SET LEADCHNL RV PACING PULSEWIDTH: 0.4 ms
MDC IDC STAT BRADY AP VP PERCENT: 1.5 %
Pulse Gen Model: 2210
Pulse Gen Serial Number: 2261146

## 2016-06-03 ENCOUNTER — Encounter: Payer: Self-pay | Admitting: Cardiovascular Disease

## 2016-06-03 ENCOUNTER — Ambulatory Visit (INDEPENDENT_AMBULATORY_CARE_PROVIDER_SITE_OTHER): Payer: Medicare Other | Admitting: Cardiovascular Disease

## 2016-06-03 VITALS — BP 102/68 | HR 61 | Ht 68.0 in | Wt 222.0 lb

## 2016-06-03 DIAGNOSIS — I10 Essential (primary) hypertension: Secondary | ICD-10-CM | POA: Diagnosis not present

## 2016-06-03 DIAGNOSIS — I480 Paroxysmal atrial fibrillation: Secondary | ICD-10-CM | POA: Diagnosis not present

## 2016-06-03 DIAGNOSIS — I495 Sick sinus syndrome: Secondary | ICD-10-CM

## 2016-06-03 DIAGNOSIS — Z950 Presence of cardiac pacemaker: Secondary | ICD-10-CM | POA: Diagnosis not present

## 2016-06-03 DIAGNOSIS — Z7901 Long term (current) use of anticoagulants: Secondary | ICD-10-CM

## 2016-06-03 DIAGNOSIS — E669 Obesity, unspecified: Secondary | ICD-10-CM

## 2016-06-03 LAB — CUP PACEART INCLINIC DEVICE CHECK
Date Time Interrogation Session: 20170613122536
Implantable Lead Implant Date: 20100507
Lead Channel Setting Pacing Pulse Width: 0.4 ms
Lead Channel Setting Sensing Sensitivity: 2 mV
MDC IDC LEAD IMPLANT DT: 20100507
MDC IDC LEAD LOCATION: 753859
MDC IDC LEAD LOCATION: 753860
MDC IDC PG SERIAL: 2261146
MDC IDC SET LEADCHNL RA PACING AMPLITUDE: 1.625
MDC IDC SET LEADCHNL RV PACING AMPLITUDE: 1.5 V
Pulse Gen Model: 2210

## 2016-06-03 MED ORDER — HYDROCHLOROTHIAZIDE 12.5 MG PO TABS: 12.5000 mg | ORAL_TABLET | Freq: Every day | ORAL | Status: DC

## 2016-06-03 NOTE — Progress Notes (Signed)
Cardiology Office Note    Date:  06/03/2016   ID:  [name], DOB May 16, 1932, MRN [id number]  PCP:  [name]., MD  Cardiologist:   [name] Klein, MD   Chief Complaint  Patient presents with  . 6 MONTHS  . Edema    History of Present Illness:  [name] is a 82 y.o. female here for follow-up of her dual-chamber permanent pacemaker. The device was implanted in 2010 for tachycardia-bradycardia syndrome (paroxysmal atrial tachycardia, paroxysmal atrial fibrillation, symptomatic sinus bradycardia).  She has not had any major health challenges since her last appointment but did have a difficult bout of vertigo. She underwent some physical therapy that really helped with the balance and has also made her become a little more active again.  She denies exertional dyspnea or angina has not had syncope, palpitations or falls. She has not had any bleeding problems. She denies leg edema. She frequently has brief orthostatic dizziness in the mornings especially. She denies any focal neurological events. She clearly prefers Eliquis to warfarin.  Her pacemaker shows normal function and generates for longevity is estimated at 6.5 years. She has 85% atrial pacing and only 2.7% ventricular pacing. The burden of atrial fibrillation is slightly higher than in the past at 3.6%. Rate control is excellent. Lead parameters remain good.  has never had embolic events that we are aware of. She does have a history of seizures associated with resection of multiple meningiomas in the past.  She has no evidence of structural heart disease by previous echo and nuclear stress testing, had minimal CAD on angiography 2011. She has preserved left ventricular systolic function. There are subtle signs of diastolic function abnormality, but no clinical heart failure.   Past Medical History  Diagnosis Date  . Seizure ([place]) 2006  . Chest pain     2D Echo 04/21/08 EF = >55%. Moderate tricuspid  regurgitation. Persantine Myovoew stress test 04/21/08 EF = 81%, no evidence of ischemia noted.  . Coronary artery disease   . Dyslipidemia   . Chronic edema     Mild LE edema  . Severe obesity ([place])   . Chronic anticoagulation     PAF  . SOB (shortness of breath) on exertion     Class II. Catheterization 04/30/10 showed mild CAD with mildly elevated right heart pressures.   . Arthritis   . Back pain     Secondary to arthritis  . Mild depression   . Brain tumor ([place])     On right side. Removed in 1989  . Vertigo   . GERD (gastroesophageal reflux disease)   . Chronic kidney disease     Mild, stage 2  . ACL (anterior cruciate ligament) rupture     Left  . Meningioma ([place])     Resection in 1998  . Ovarian cyst     Benign. Had a salpiingoophorectomy in 2005.  [name] Hyperlipidemia     On pravastatin  . PAF (paroxysmal atrial fibrillation) (HCC)     On coumadin. St. Jude PPM (serial [id number]) inserted 2010  . Paroxysmal atrial flutter ([place])   . Sinus node dysfunction (HCC)     S/P implantation of a dual-chamber permanent pacemaker in 2010  . Tachycardia-bradycardia syndrome ([place])     S/P implantation of dual-chamber permanent pacemaker in 2010  . Sick sinus syndrome ([place])   . Bruit     Duplex doppler 03/28/05 Mildly abnormal. *Right subclavian artery: Less than 50% diameter reduction.  . Acute  MI ([place])   . Coronary atherosclerosis     Minimal  . Memory difficulty 07/13/2014  . Trochlear nerve palsy     Right  . Benign paroxysmal positional vertigo 03/18/2016    Past Surgical History  Procedure Laterality Date  . Brain tumor removal  1989  . Craniotomy  1989    (Brain tumor) With gamma knife procedure as well.  . Pacemaker insertion  04/27/09    Implanted by Dr. Elisabeth Glover. [place] (serial [id number])  . Cardiac catheterization  04/30/10    Showed mild CAD with mildly elevated right heart pressures. (Dr. Elisabeth Glover)  . Knee surgery      Bilateral, arthroscopic    Current  Medications: Outpatient Prescriptions Prior to Visit  Medication Sig Dispense Refill  . allopurinol (ZYLOPRIM) 100 MG tablet Take 100 mg by mouth daily.    [name] apixaban (ELIQUIS) 2.5 MG TABS tablet Take 1 tablet (2.5 mg total) by mouth 2 (two) times daily. 60 tablet 5  . aspirin 81 MG tablet Take 81 mg by mouth daily.    . Coenzyme Q10 (CO Q 10 PO) Take by mouth.    . colchicine 0.6 MG tablet Take 0.6 mg by mouth. Reported on 03/18/2016    . ferrous sulfate 325 (65 FE) MG tablet Take 1 tablet (325 mg total) by mouth every Monday, Wednesday, and Friday. 30 tablet 2  . hydrochlorothiazide (HYDRODIURIL) 25 MG tablet Take 25 mg by mouth daily.    . meclizine (ANTIVERT) 25 MG tablet TAKE 1 TABLET(S) 3 TIMES A DAY BY ORAL ROUTE AS NEEDED.  2  . Multiple Vitamins-Minerals (MULTIVITAMIN PO) Take by mouth daily.    . nitroGLYCERIN (NITROLINGUAL) 0.4 MG/SPRAY spray Place 3 sprays under the tongue every 5 (five) minutes x 3 doses as needed for chest pain.    [name] omeprazole (PRILOSEC) 20 MG capsule Take 20 mg by mouth daily.    . pravastatin (PRAVACHOL) 20 MG tablet Take 20 mg by mouth at bedtime.    . topiramate (TOPAMAX) 50 MG tablet Take 50 mg by mouth 2 (two) times daily.    . traMADol (ULTRAM) 50 MG tablet Take 50 mg by mouth every 6 (six) hours as needed for moderate pain.    . verapamil (CALAN-SR) 120 MG CR tablet Take 120 mg by mouth at bedtime.    . Vitamin D, Ergocalciferol, (DRISDOL) 50000 UNITS CAPS capsule Take 1 capsule by mouth. Twice weekly  3  . potassium chloride (KLOR-CON) 8 MEQ tablet Take 8 mEq by mouth daily.      No facility-administered medications prior to visit.     Allergies:   Dilantin and Phenobarbital   Social History   Social History  . Marital Status: Divorced    Spouse Name: N/A  . Number of Children: 3  . Years of Education: N/A   Occupational History  .      Retired   Social History Main Topics  . Smoking status: Former Smoker -- 20 years    Types: Cigarettes     Quit date: 12/23/2007  . Smokeless tobacco: Never Used  . Alcohol Use: No  . Drug Use: No  . Sexual Activity: Not Asked   Other Topics Concern  . None   Social History Narrative     Family History:  The patient's family history includes Heart disease in her brother, brother, brother, father, sister, sister, and sister; Stroke in her mother.   ROS:   Please see the history of  present illness.    ROS All other systems reviewed and are negative.   PHYSICAL EXAM:   VS:  BP 102/68 mmHg  Pulse 69  Ht 5\' 8"  (1.727 m)  Wt 100.699 kg (222 lb)  BMI 33.76 kg/m2   GEN: Moderately obese, well developed, in no acute distress HEENT: normal Neck: no JVD, carotid bruits, or masses Cardiac: RRR; no murmurs, rubs, or gallops,no edema , healthy subclavian pacemaker site Respiratory:  clear to auscultation bilaterally, normal work of breathing GI: soft, nontender, nondistended, + BS MS: no deformity or atrophy Skin: warm and dry, no rash Neuro:  Alert and Oriented x 3, Strength and sensation are intact Psych: euthymic mood, full affect  Wt Readings from Last 3 Encounters:  06/03/16 100.699 kg (222 lb)  03/18/16 100.245 kg (221 lb)  10/09/15 98.431 kg (217 lb)      Studies/Labs Reviewed:   EKG:  EKG is ordered today.  The ekg ordered today demonstrates Atrial paced, ventricular sensed, AV delay 200 ms, QTC 390 ms  Recent Labs: 02/07/2016: BUN 26*; Creat 1.54*; Potassium 3.9; Sodium 140   Lipid Panel No results found for: CHOL, TRIG, HDL, CHOLHDL, VLDL, LDLCALC, LDLDIRECT  Additional studies/ records that were reviewed today include:  Notes from her neurologist, Dr. [name]    ASSESSMENT:    1. Paroxysmal atrial fibrillation (HCC)   2. Long term (current) use of anticoagulants   3. SSS (sick sinus syndrome) ([place])   4. Pacemaker - dual chamber St. Jude RF device, implanted May 2010   5. Essential hypertension   6. Obesity (BMI 30.0-34.9)      PLAN:  In order of  problems listed above:  1. Paroxysmal atrial fibrillation, good rate control and anticoagulation, asymptomatic.   2. Anticoagulation without serious bleeding complications.CHADSVasc 4 (age 14, HTN, gender).  3. Sinus node dysfunction with normally functioning dual-chamber permanent pacemaker.   4. PPM: Continue remote pacemaker downloads every 3 months and yearly office visit  5. Essential hypertension with fair, simply excessive control. Appears to have some orthostatic hypotension. In addition she is taking allopurinol and colchicine for gout/hyperuricemia. Will decrease the hydrochlorothiazide to only 12.5 mg daily.  6. Obesity , continue efforts at weight loss and regular exercise.    Medication Adjustments/Labs and Tests Ordered: Current medicines are reviewed at length with the patient today.  Concerns regarding medicines are outlined above.  Medication changes, Labs and Tests ordered today are listed in the Patient Instructions below. There are no Patient Instructions on file for this visit.   Signed, [name] Klein, MD  06/03/2016 10:32 AM    [facility] [place], [place], [place]  [address] Phone: [phone]; Fax: [phone]

## 2016-06-03 NOTE — Patient Instructions (Addendum)
Dr Sallyanne Kuster has recommended making the following medication changes: 1. DECREASE Hydrchlorothiazide to 12.5 mg daily  Remote monitoring is used to monitor your Pacemaker of ICD from home. This monitoring reduces the number of office visits required to check your device to one time per year. It allows [name] to keep an eye on the functioning of your device to ensure it is working properly. You are scheduled for a device check from home on Tuesday, September 12th, 2017. You may send your transmission at any time that day. If you have a wireless device, the transmission will be sent automatically. After your physician reviews your transmission, you will receive a postcard with your next transmission date.  Dr Sallyanne Kuster recommends that you schedule a follow-up appointment in 6 months with a pacemaker check. You will receive a reminder letter in the mail two months in advance. If you don't receive a letter, please call our office to schedule the follow-up appointment.  If you need a refill on your cardiac medications before your next appointment, please call your pharmacy.

## 2016-06-04 DIAGNOSIS — I495 Sick sinus syndrome: Secondary | ICD-10-CM | POA: Insufficient documentation

## 2016-06-05 ENCOUNTER — Encounter: Payer: Self-pay | Admitting: Cardiology

## 2016-06-19 NOTE — Addendum Note (Signed)
Addended by: Therisa Doyne on: 06/19/2016 03:48 PM   Modules accepted: Orders

## 2016-07-09 ENCOUNTER — Ambulatory Visit: Payer: Medicare Other | Admitting: Adult Health

## 2016-07-30 ENCOUNTER — Encounter: Payer: Self-pay | Admitting: Adult Health

## 2016-07-30 ENCOUNTER — Ambulatory Visit (INDEPENDENT_AMBULATORY_CARE_PROVIDER_SITE_OTHER): Payer: Medicare Other | Admitting: Adult Health

## 2016-07-30 VITALS — BP 138/71 | HR 64 | Ht 68.0 in | Wt 224.6 lb

## 2016-07-30 DIAGNOSIS — R569 Unspecified convulsions: Secondary | ICD-10-CM | POA: Diagnosis not present

## 2016-07-30 DIAGNOSIS — H8110 Benign paroxysmal vertigo, unspecified ear: Secondary | ICD-10-CM | POA: Diagnosis not present

## 2016-07-30 NOTE — Progress Notes (Signed)
PATIENT: [name] DOB: [birth year]  REASON FOR VISIT: follow up- seizures, vertigo HISTORY FROM: patient  HISTORY OF PRESENT ILLNESS: [name] is an 82 year old female with a history of seizures and vertigo. She returns today for follow-up. She is currently on Topamax and tolerating it well. She is not had any seizure events. She denies any trouble with her memory. She does feel that she cannot think as quickly as she used to. She is able to complete all ADLs independently. She did participate in vestibular rehabilitation for vertigo. She states that that resolved her dizziness. She is doing the exercises she learned at home. So far she is not had any recurrent episodes of vertigo. She returns today for an evaluation.  HISTORY 03/18/16:[name] is an 81 year old right-handed black female with a history of seizures, well controlled on Topamax. The patient has a history of multiple meningiomas, she has had a prior right craniotomy and some encephalomalacia of the anterior portion of the right temporal lobe. The patient had a fall around 01/30/2016, she fell forwards and hit a door with the head. The patient went to the emergency room, underwent a CT of the head that was unremarkable. She was on Coumadin at that time, she was switched to Eliquis approximately 2 or 3 weeks later. Within a week after being switched to Eliquis, the patient began developing episodes of vertigo that usually would start when she first got up in the morning. She has noted that if she looks down or looks up she can bring on the dizziness which is a spinning sensation. The patient has oscillopsia, no definite double vision with the vertigo. The patient may have some occasional headaches during the day over the last couple weeks. She denies any slurred speech, difficulty swallowing, numbness or weakness of extremities. She does have some gait instability associated with the vertigo. She recently has had a urinary tract  infection, placed on an antibiotic within the last day or 2. The patient comes to this office for an evaluation. She has had a change in hearing in the left ear recently, she went to get her hearing aid checked. She denies any ear pain.  REVIEW OF SYSTEMS: Out of a complete 14 system review of symptoms, the patient complains only of the following symptoms, and all other reviewed systems are negative.  See history of present illness  ALLERGIES: Allergies  Allergen Reactions  . Dilantin [Phenytoin Sodium Extended] Hives  . Phenobarbital Hives    HOME MEDICATIONS: Outpatient Medications Prior to Visit  Medication Sig Dispense Refill  . allopurinol (ZYLOPRIM) 100 MG tablet Take 100 mg by mouth daily.    [name] apixaban (ELIQUIS) 2.5 MG TABS tablet Take 1 tablet (2.5 mg total) by mouth 2 (two) times daily. 60 tablet 5  . aspirin 81 MG tablet Take 81 mg by mouth daily.    . Coenzyme Q10 (CO Q 10 PO) Take by mouth.    . colchicine 0.6 MG tablet Take 0.6 mg by mouth. Reported on 03/18/2016    . ferrous sulfate 325 (65 FE) MG tablet Take 1 tablet (325 mg total) by mouth every Monday, Wednesday, and Friday. 30 tablet 2  . hydrochlorothiazide (HYDRODIURIL) 12.5 MG tablet Take 1 tablet (12.5 mg total) by mouth daily. 30 tablet 11  . meclizine (ANTIVERT) 25 MG tablet TAKE 1 TABLET(S) 3 TIMES A DAY BY ORAL ROUTE AS NEEDED.  2  . Multiple Vitamins-Minerals (MULTIVITAMIN PO) Take by mouth daily.    [name]  nitroGLYCERIN (NITROLINGUAL) 0.4 MG/SPRAY spray Place 3 sprays under the tongue every 5 (five) minutes x 3 doses as needed for chest pain.    [name] omeprazole (PRILOSEC) 20 MG capsule Take 20 mg by mouth daily.    . pravastatin (PRAVACHOL) 20 MG tablet Take 20 mg by mouth at bedtime.    . topiramate (TOPAMAX) 50 MG tablet Take 50 mg by mouth 2 (two) times daily.    . traMADol (ULTRAM) 50 MG tablet Take 50 mg by mouth every 6 (six) hours as needed for moderate pain.    . verapamil (CALAN-SR) 120 MG CR tablet Take 120  mg by mouth at bedtime.    . Vitamin D, Ergocalciferol, (DRISDOL) 50000 UNITS CAPS capsule Take 1 capsule by mouth. Twice weekly  3   No facility-administered medications prior to visit.     PAST MEDICAL HISTORY: Past Medical History:  Diagnosis Date  . ACL (anterior cruciate ligament) rupture    Left  . Acute MI ([place])   . Arthritis   . Back pain    Secondary to arthritis  . Benign paroxysmal positional vertigo 03/18/2016  . Brain tumor ([place])    On right side. Removed in 1989  . Bruit    Duplex doppler 03/28/05 Mildly abnormal. *Right subclavian artery: Less than 50% diameter reduction.  . Chest pain    2D Echo 04/21/08 EF = >55%. Moderate tricuspid regurgitation. Persantine Myovoew stress test 04/21/08 EF = 81%, no evidence of ischemia noted.  . Chronic anticoagulation    PAF  . Chronic edema    Mild LE edema  . Chronic kidney disease    Mild, stage 2  . Coronary artery disease   . Coronary atherosclerosis    Minimal  . Dyslipidemia   . GERD (gastroesophageal reflux disease)   . Hyperlipidemia    On pravastatin  . Memory difficulty 07/13/2014  . Meningioma ([place])    Resection in 1998  . Mild depression   . Ovarian cyst    Benign. Had a salpiingoophorectomy in 2005.  [name] PAF (paroxysmal atrial fibrillation) (HCC)    On coumadin. St. Jude PPM (serial [id number]) inserted 2010  . Paroxysmal atrial flutter ([place])   . Seizure ([place]) 2006  . Severe obesity ([place])   . Sick sinus syndrome ([place])   . Sinus node dysfunction (HCC)    S/P implantation of a dual-chamber permanent pacemaker in 2010  . SOB (shortness of breath) on exertion    Class II. Catheterization 04/30/10 showed mild CAD with mildly elevated right heart pressures.   . Tachycardia-bradycardia syndrome ([place])    S/P implantation of dual-chamber permanent pacemaker in 2010  . Trochlear nerve palsy    Right  . Vertigo     PAST SURGICAL HISTORY: Past Surgical History:  Procedure Laterality Date  . Brain tumor removal   1989  . CARDIAC CATHETERIZATION  04/30/10   Showed mild CAD with mildly elevated right heart pressures. (Dr. Elisabeth Cara)  . CRANIOTOMY  1989   (Brain tumor) With gamma knife procedure as well.  [name] KNEE SURGERY     Bilateral, arthroscopic  . PACEMAKER INSERTION  04/27/09   Implanted by Dr. Elisabeth Cara. St. Jude Accent DR (serial [id number])    FAMILY HISTORY: Family History  Problem Relation Age of Onset  . Stroke Mother   . Heart disease Father   . Heart disease Sister   . Heart disease Brother   . Heart disease Brother   . Heart disease Brother   . Heart  disease Sister   . Heart disease Sister     SOCIAL HISTORY: Social History   Social History  . Marital status: Divorced    Spouse name: N/A  . Number of children: 3  . Years of education: N/A   Occupational History  .      Retired   Social History Main Topics  . Smoking status: Former Smoker    Years: 20.00    Types: Cigarettes    Quit date: 12/23/2007  . Smokeless tobacco: Never Used  . Alcohol use No  . Drug use: No  . Sexual activity: Not on file   Other Topics Concern  . Not on file   Social History Narrative  . No narrative on file      PHYSICAL EXAM  Vitals:   07/30/16 0801  BP: 138/71  Pulse: 64  Weight: 224 lb 9.6 oz (101.9 kg)  Height: _0  (1.727 m)   Body mass index is 34.15 kg/m.  Generalized: Well developed, in no acute distress   Neurological examination  Mentation: Alert oriented to time, place, history taking. Follows all commands speech and language fluent Cranial nerve II-XII: Pupils were equal round reactive to light. Extraocular movements were full, visual field were full on confrontational test. Facial sensation and strength were normal. Uvula tongue midline. Head turning and shoulder shrug  were normal and symmetric. Motor: The motor testing reveals 5 over 5 strength of all 4 extremities. Good symmetric motor tone is noted throughout.  Sensory: Sensory testing is intact to soft touch  on all 4 extremities. No evidence of extinction is noted.  Coordination: Cerebellar testing reveals good finger-nose-finger and heel-to-shin bilaterally.  Gait and station: Gait is normal. Tandem gait Not attempted. Romberg is negative. No drift is seen.  Reflexes: Deep tendon reflexes are symmetric and normal bilaterally.   DIAGNOSTIC DATA (LABS, IMAGING, TESTING) - I reviewed patient records, labs, notes, testing and imaging myself where available.      Component Value Date/Time   NA 140 02/07/2016 1051   K 3.9 02/07/2016 1051   CL 105 02/07/2016 1051   CO2 20 02/07/2016 1051   GLUCOSE 134 (H) 02/07/2016 1051   BUN 26 (H) 02/07/2016 1051   CREATININE 1.54 (H) 02/07/2016 1051   CALCIUM 9.8 02/07/2016 1051   PROT 7.0 04/23/2009 1930   ALBUMIN 3.2 (L) 04/23/2009 1930   AST 19 04/23/2009 1930   ALT 12 04/23/2009 1930   ALKPHOS 71 04/23/2009 1930   BILITOT 0.6 04/23/2009 1930   GFRNONAA 37 (L) 04/27/2009 0434   GFRAA (L) 04/27/2009 0434    45        The eGFR has been calculated using the MDRD equation. This calculation has not been validated in all clinical situations. eGFR's persistently <60 mL/min signify possible Chronic Kidney Disease.       ASSESSMENT AND PLAN 82 y.o. year old female  has a past medical history of ACL (anterior cruciate ligament) rupture; Acute MI ([place]); Arthritis; Back pain; Benign paroxysmal positional vertigo (03/18/2016); Brain tumor ([place]); Bruit; Chest pain; Chronic anticoagulation; Chronic edema; Chronic kidney disease; Coronary artery disease; Coronary atherosclerosis; Dyslipidemia; GERD (gastroesophageal reflux disease); Hyperlipidemia; Memory difficulty (07/13/2014); Meningioma ([place]); Mild depression; Ovarian cyst; PAF (paroxysmal atrial fibrillation) ([place]); Paroxysmal atrial flutter ([place]); Seizure ([place]) (2006); Severe obesity ([place]); Sick sinus syndrome ([place]); Sinus node dysfunction (HCC); SOB (shortness of breath) on exertion;  Tachycardia-bradycardia syndrome ([place]); Trochlear nerve palsy; and Vertigo. here with:  1. Seizures 2. Vertigo  Overall the  patient is doing well. She will continue on Topamax. If she has any seizure events she should let us know. Her dizziness resolved with vestibular rehabilitation. Advised that if this reoccurs she should let us know. Will follow-up in 6 months or sooner if needed.     Ward Givens, MSN, NP-C 07/30/2016, 8:17 AM [facility] [address] [place], [place] [address] [phone]

## 2016-07-30 NOTE — Progress Notes (Signed)
I have read the note, and I agree with the clinical assessment and plan.  WILLIS,CHARLES KEITH   

## 2016-07-30 NOTE — Patient Instructions (Signed)
Continue Topamax If your symptoms worsen or you develop new symptoms please let us know.   

## 2016-08-06 ENCOUNTER — Other Ambulatory Visit: Payer: Self-pay | Admitting: Cardiovascular Disease

## 2016-09-02 ENCOUNTER — Telehealth: Payer: Self-pay | Admitting: Cardiology

## 2016-09-02 ENCOUNTER — Ambulatory Visit (INDEPENDENT_AMBULATORY_CARE_PROVIDER_SITE_OTHER): Payer: Medicare Other | Admitting: *Deleted

## 2016-09-02 DIAGNOSIS — I495 Sick sinus syndrome: Secondary | ICD-10-CM

## 2016-09-02 NOTE — Telephone Encounter (Signed)
Spoke with pt and reminded pt of remote transmission that is due today. Pt verbalized understanding.   

## 2016-09-03 ENCOUNTER — Telehealth: Payer: Self-pay | Admitting: Cardiovascular Disease

## 2016-09-03 NOTE — Telephone Encounter (Signed)
New Message:    Please call,having problem with transmitting.

## 2016-09-03 NOTE — Telephone Encounter (Signed)
Spoke w/ pt and informed her that her remote transmission was received.  

## 2016-09-03 NOTE — Progress Notes (Signed)
Remote pacemaker transmission.   

## 2016-09-04 ENCOUNTER — Encounter: Payer: Self-pay | Admitting: Cardiology

## 2016-09-12 LAB — CUP PACEART REMOTE DEVICE CHECK
Brady Statistic AP VS Percent: 89 %
Brady Statistic AS VP Percent: 1 %
Brady Statistic AS VS Percent: 7.6 %
Implantable Lead Implant Date: 20100507
Implantable Lead Location: 753859
Lead Channel Pacing Threshold Amplitude: 0.625 V
Lead Channel Pacing Threshold Amplitude: 1.125 V
Lead Channel Pacing Threshold Pulse Width: 0.4 ms
Lead Channel Sensing Intrinsic Amplitude: 12 mV
Lead Channel Setting Pacing Amplitude: 1.375
Lead Channel Setting Pacing Amplitude: 1.625
MDC IDC LEAD IMPLANT DT: 20100507
MDC IDC LEAD LOCATION: 753860
MDC IDC MSMT BATTERY REMAINING LONGEVITY: 68 mo
MDC IDC MSMT BATTERY REMAINING PERCENTAGE: 57 %
MDC IDC MSMT BATTERY VOLTAGE: 2.89 V
MDC IDC MSMT LEADCHNL RA IMPEDANCE VALUE: 340 Ohm
MDC IDC MSMT LEADCHNL RA PACING THRESHOLD PULSEWIDTH: 0.4 ms
MDC IDC MSMT LEADCHNL RA SENSING INTR AMPL: 5 mV
MDC IDC MSMT LEADCHNL RV IMPEDANCE VALUE: 590 Ohm
MDC IDC SESS DTM: 20170913140834
MDC IDC SET LEADCHNL RV PACING PULSEWIDTH: 0.4 ms
MDC IDC SET LEADCHNL RV SENSING SENSITIVITY: 2 mV
MDC IDC STAT BRADY AP VP PERCENT: 2 %
MDC IDC STAT BRADY RA PERCENT PACED: 85 %
MDC IDC STAT BRADY RV PERCENT PACED: 3 %
Pulse Gen Model: 2210
Pulse Gen Serial Number: 2261146

## 2016-12-31 ENCOUNTER — Encounter: Payer: Self-pay | Admitting: Cardiovascular Disease

## 2016-12-31 ENCOUNTER — Ambulatory Visit (INDEPENDENT_AMBULATORY_CARE_PROVIDER_SITE_OTHER): Payer: Medicare Other | Admitting: Cardiovascular Disease

## 2016-12-31 VITALS — BP 138/62 | HR 62 | Ht 68.0 in | Wt 224.6 lb

## 2016-12-31 DIAGNOSIS — I480 Paroxysmal atrial fibrillation: Secondary | ICD-10-CM

## 2016-12-31 DIAGNOSIS — Z950 Presence of cardiac pacemaker: Secondary | ICD-10-CM

## 2016-12-31 DIAGNOSIS — I495 Sick sinus syndrome: Secondary | ICD-10-CM

## 2016-12-31 DIAGNOSIS — Z7901 Long term (current) use of anticoagulants: Secondary | ICD-10-CM | POA: Diagnosis not present

## 2016-12-31 DIAGNOSIS — E669 Obesity, unspecified: Secondary | ICD-10-CM

## 2016-12-31 DIAGNOSIS — I10 Essential (primary) hypertension: Secondary | ICD-10-CM

## 2016-12-31 MED ORDER — TRAMADOL HCL 50 MG PO TABS: 50.0000 mg | ORAL_TABLET | Freq: Four times a day (QID) | ORAL | 0 refills | Status: DC | PRN

## 2016-12-31 NOTE — Patient Instructions (Signed)
Dr Sallyanne Kuster recommends that you continue on your current medications as directed. Please refer to the Current Medication list given to you today.  Remote monitoring is used to monitor your Pacemaker of ICD from home. This monitoring reduces the number of office visits required to check your device to one time per year. It allows [name] to keep an eye on the functioning of your device to ensure it is working properly. You are scheduled for a device check from home on Wednesday, April 11th, 2018. You may send your transmission at any time that day. If you have a wireless device, the transmission will be sent automatically. After your physician reviews your transmission, you will receive a postcard with your next transmission date.  Dr Sallyanne Kuster recommends that you schedule a follow-up appointment in 6 months with a pacemaker check. You will receive a reminder letter in the mail two months in advance. If you don't receive a letter, please call our office to schedule the follow-up appointment.  If you need a refill on your cardiac medications before your next appointment, please call your pharmacy.

## 2016-12-31 NOTE — Progress Notes (Signed)
Cardiology Office Note    Date:  01/01/2017   ID:  [name], DOB 01/15/1932, MRN [id number]  PCP:  Salena Saner., MD  Cardiologist:   Sanda Klein, MD   Chief Complaint  Patient presents with  . Follow-up    pt c/o DOE    History of Present Illness:  [name] is a 83 y.o. female here for follow-up of her dual-chamber permanent pacemaker. The device was implanted in 2010 for tachycardia-bradycardia syndrome (paroxysmal atrial tachycardia, paroxysmal atrial fibrillation, symptomatic sinus bradycardia). She is accompanied by her daughter, who always points out that her mother is very sedentary and should exercise more. Mrs. [name] points out that she spends roughly 20 minutes a day on the stationary bike 5 days a week.  She denies exertional dyspnea or angina has not had syncope, palpitations or falls. She has not had any bleeding problems and reports compliance with Eliquis. She denies leg edema. She frequently has brief orthostatic dizziness in the mornings especially. She denies any focal neurological events.   Her pacemaker shows normal function and generator longevity is estimated at 5 years. She has 85% atrial pacing and only 3% ventricular pacing. The burden of atrial fibrillation is back down to her usual burden of 1.1%, around which it has oscillated since pacemaker implantation. Rate control is excellent. Lead parameters remain good.  She has never had embolic events that we are aware of. She does have a history of seizures associated with resection of multiple meningiomas in the past.  She has no evidence of structural heart disease by previous echo and nuclear stress testing, had minimal CAD on angiography 2011. She has preserved left ventricular systolic function. There are subtle signs of diastolic function abnormality, but no clinical heart failure.   Past Medical History:  Diagnosis Date  . ACL (anterior cruciate ligament) rupture    Left  .  Acute MI   . Arthritis   . Back pain    Secondary to arthritis  . Benign paroxysmal positional vertigo 03/18/2016  . Brain tumor ([place])    On right side. Removed in 1989  . Bruit    Duplex doppler 03/28/05 Mildly abnormal. *Right subclavian artery: Less than 50% diameter reduction.  . Chest pain    2D Echo 04/21/08 EF = >55%. Moderate tricuspid regurgitation. Persantine Myovoew stress test 04/21/08 EF = 81%, no evidence of ischemia noted.  . Chronic anticoagulation    PAF  . Chronic edema    Mild LE edema  . Chronic kidney disease    Mild, stage 2  . Coronary artery disease   . Coronary atherosclerosis    Minimal  . Dyslipidemia   . GERD (gastroesophageal reflux disease)   . Hyperlipidemia    On pravastatin  . Memory difficulty 07/13/2014  . Meningioma ([place])    Resection in 1998  . Mild depression ([place])   . Ovarian cyst    Benign. Had a salpiingoophorectomy in 2005.  [name] PAF (paroxysmal atrial fibrillation) (HCC)    On coumadin. St. Jude PPM (serial [id number]) inserted 2010  . Paroxysmal atrial flutter ([employer])   . Seizure ([place]) 2006  . Severe obesity ([place])   . Sick sinus syndrome ([place])   . Sinus node dysfunction (HCC)    S/P implantation of a dual-chamber permanent pacemaker in 2010  . SOB (shortness of breath) on exertion    Class II. Catheterization 04/30/10 showed mild CAD with mildly elevated right heart pressures.   . Tachycardia-bradycardia syndrome ([place])  S/P implantation of dual-chamber permanent pacemaker in 2010  . Trochlear nerve palsy    Right  . Vertigo     Past Surgical History:  Procedure Laterality Date  . Brain tumor removal  1989  . CARDIAC CATHETERIZATION  04/30/10   Showed mild CAD with mildly elevated right heart pressures. (Dr. Elisabeth Cara)  . CRANIOTOMY  1989   (Brain tumor) With gamma knife procedure as well.  [name] KNEE SURGERY     Bilateral, arthroscopic  . PACEMAKER INSERTION  04/27/09   Implanted by Dr. Elisabeth Cara. St. Jude Accent DR (serial [id number])     Current Medications: Outpatient Medications Prior to Visit  Medication Sig Dispense Refill  . allopurinol (ZYLOPRIM) 100 MG tablet Take 100 mg by mouth daily.    [name] aspirin 81 MG tablet Take 81 mg by mouth daily.    . Coenzyme Q10 (CO Q 10 PO) Take by mouth.    . colchicine 0.6 MG tablet Take 0.6 mg by mouth. Reported on 03/18/2016    . ELIQUIS 2.5 MG TABS tablet TAKE 1 TABLET BY MOUTH TWICE A DAY 60 tablet 5  . ferrous sulfate 325 (65 FE) MG tablet Take 1 tablet (325 mg total) by mouth every Monday, Wednesday, and Friday. 30 tablet 2  . hydrochlorothiazide (HYDRODIURIL) 12.5 MG tablet Take 1 tablet (12.5 mg total) by mouth daily. 30 tablet 11  . meclizine (ANTIVERT) 25 MG tablet TAKE 1 TABLET(S) 3 TIMES A DAY BY ORAL ROUTE AS NEEDED.  2  . Multiple Vitamins-Minerals (MULTIVITAMIN PO) Take by mouth daily.    . nitroGLYCERIN (NITROLINGUAL) 0.4 MG/SPRAY spray Place 3 sprays under the tongue every 5 (five) minutes x 3 doses as needed for chest pain.    [name] omeprazole (PRILOSEC) 20 MG capsule Take 20 mg by mouth daily.    . pravastatin (PRAVACHOL) 20 MG tablet Take 20 mg by mouth at bedtime.    . topiramate (TOPAMAX) 50 MG tablet Take 50 mg by mouth 2 (two) times daily.    . verapamil (CALAN-SR) 120 MG CR tablet Take 120 mg by mouth at bedtime.    . Vitamin D, Ergocalciferol, (DRISDOL) 50000 UNITS CAPS capsule Take 1 capsule by mouth. Twice weekly  3  . traMADol (ULTRAM) 50 MG tablet Take 50 mg by mouth every 6 (six) hours as needed for moderate pain.     No facility-administered medications prior to visit.      Allergies:   Dilantin [phenytoin sodium extended] and Phenobarbital   Social History   Social History  . Marital status: Divorced    Spouse name: N/A  . Number of children: 3  . Years of education: N/A   Occupational History  .      Retired   Social History Main Topics  . Smoking status: Former Smoker    Years: 20.00    Types: Cigarettes    Quit date: 12/23/2007  .  Smokeless tobacco: Never Used  . Alcohol use No  . Drug use: No  . Sexual activity: Not Asked   Other Topics Concern  . None   Social History Narrative  . None     Family History:  The patient's family history includes Heart disease in her brother, brother, brother, father, sister, sister, and sister; Stroke in her mother.   ROS:   Please see the history of present illness.    ROS All other systems reviewed and are negative.   PHYSICAL EXAM:   VS:  BP 138/62   Pulse  62   Ht 5\' 8"  (1.727 m)   Wt 101.9 kg (224 lb 9.6 oz)   SpO2 97%   BMI 34.15 kg/m    GEN: Moderately obese, well developed, in no acute distress  HEENT: normal  Neck: no JVD, carotid bruits, or masses Cardiac: RRR; no murmurs, rubs, or gallops,no edema , healthy subclavian pacemaker site Respiratory:  clear to auscultation bilaterally, normal work of breathing GI: soft, nontender, nondistended, + BS MS: no deformity or atrophy  Skin: warm and dry, no rash Neuro:  Alert and Oriented x 3, Strength and sensation are intact Psych: euthymic mood, full affect  Wt Readings from Last 3 Encounters:  12/31/16 101.9 kg (224 lb 9.6 oz)  07/30/16 101.9 kg (224 lb 9.6 oz)  06/03/16 100.7 kg (222 lb)      Studies/Labs Reviewed:   EKG:  EKG is ordered today.  The ekg ordered today demonstrates Atrial paced, ventricular sensed, AV delay 200 ms, QTC 390 ms  Recent Labs: 02/07/2016: BUN 26; Creat 1.54; Potassium 3.9; Sodium 140    ASSESSMENT:    1. Paroxysmal atrial fibrillation (HCC)   2. Long term current use of anticoagulant therapy   3. SSS (sick sinus syndrome) ([place])   4. Pacemaker   5. Essential hypertension   6. Obesity (BMI 30.0-34.9)      PLAN:  In order of problems listed above:  1. Paroxysmal atrial fibrillation, good rate control and anticoagulation, asymptomatic.  2. Anticoagulation without serious bleeding complications.CHADSVasc 4 (age 89, HTN, gender). 3. Sinus node dysfunction with  normally functioning dual-chamber permanent pacemaker.  4. PPM: Continue remote pacemaker downloads every 3 months and yearly office visit 5. Essential hypertension with fair control. Less orthostatic hypotension after we decreased the dose of diuretic.  6. Obesity , continue efforts at weight loss and regular exercise. Despite her daughter's best efforts, I don't think Mrs. Kerner is truly interested in losing weight or exercising more    Medication Adjustments/Labs and Tests Ordered: Current medicines are reviewed at length with the patient today.  Concerns regarding medicines are outlined above.  Medication changes, Labs and Tests ordered today are listed in the Patient Instructions below. Patient Instructions  Dr Sallyanne Kuster recommends that you continue on your current medications as directed. Please refer to the Current Medication list given to you today.  Remote monitoring is used to monitor your Pacemaker of ICD from home. This monitoring reduces the number of office visits required to check your device to one time per year. It allows [name] to keep an eye on the functioning of your device to ensure it is working properly. You are scheduled for a device check from home on Wednesday, April 11th, 2018. You may send your transmission at any time that day. If you have a wireless device, the transmission will be sent automatically. After your physician reviews your transmission, you will receive a postcard with your next transmission date.  Dr Sallyanne Kuster recommends that you schedule a follow-up appointment in 6 months with a pacemaker check. You will receive a reminder letter in the mail two months in advance. If you don't receive a letter, please call our office to schedule the follow-up appointment.  If you need a refill on your cardiac medications before your next appointment, please call your pharmacy.    Signed, Sanda Klein, MD  01/01/2017 6:23 PM    [address], [place], [place]  [address] Phone: [phone]; Fax: [phone]

## 2017-02-03 ENCOUNTER — Other Ambulatory Visit: Payer: Self-pay | Admitting: Cardiovascular Disease

## 2017-02-03 ENCOUNTER — Encounter: Payer: Self-pay | Admitting: Adult Health

## 2017-02-03 ENCOUNTER — Ambulatory Visit (INDEPENDENT_AMBULATORY_CARE_PROVIDER_SITE_OTHER): Payer: Medicare Other | Admitting: Adult Health

## 2017-02-03 VITALS — BP 168/79 | HR 63 | Ht 68.0 in | Wt 224.6 lb

## 2017-02-03 DIAGNOSIS — Z86011 Personal history of benign neoplasm of the brain: Secondary | ICD-10-CM | POA: Diagnosis not present

## 2017-02-03 DIAGNOSIS — R569 Unspecified convulsions: Secondary | ICD-10-CM

## 2017-02-03 NOTE — Patient Instructions (Signed)
Memory score is stable Continue Topamax 50 mg twice a day If your symptoms worsen or you develop new symptoms please let us know.

## 2017-02-03 NOTE — Progress Notes (Signed)
I have read the note, and I agree with the clinical assessment and plan.  Anup Brigham KEITH   

## 2017-02-03 NOTE — Progress Notes (Addendum)
PATIENT: [name] DOB: 01/20/1932  REASON FOR VISIT: follow up- seizures HISTORY FROM: patient  HISTORY OF PRESENT ILLNESS: [name] is an 83 year old female with a history of seizures and meningioma. She returns today for follow-up. She remains on Topamax. She denies any seizure events. She continues to report trouble with her memory. She states she notices it mainly with people's names. She states that she may not recall their name right away but eventually will be able to recall it. She lives with her daughter. She is able to complete all ADLs and apparently. She has noticed more trouble showering due to arthritis. Reports that she is walking slower. She does not operate a motor vehicle. She is able to prepare all meals without difficulty. She handles her own finances. Her last CT of the head was in April 2017 and was stable compared to her scan in 2015. Patient returns today for an evaluation.  HISTORY 07/30/16: [name] is an 83 year old female with a history of seizures and vertigo. She returns today for follow-up. She is currently on Topamax and tolerating it well. She is not had any seizure events. She denies any trouble with her memory. She does feel that she cannot think as quickly as she used to. She is able to complete all ADLs independently. She did participate in vestibular rehabilitation for vertigo. She states that that resolved her dizziness. She is doing the exercises she learned at home. So far she is not had any recurrent episodes of vertigo. She returns today for an evaluation.  HISTORY 03/18/16:[name] is an 83 year old right-handed black female with a history of seizures, well controlled on Topamax. The patient has a history of multiple meningiomas, she has had a prior right craniotomy and some encephalomalacia of the anterior portion of the right temporal lobe. The patient had a fall around 01/30/2016, she fell forwards and hit a door with the head. The patient  went to the emergency room, underwent a CT of the head that was unremarkable. She was on Coumadin at that time, she was switched to Eliquis approximately 2 or 3 weeks later. Within a week after being switched to Eliquis, the patient began developing episodes of vertigo that usually would start when she first got up in the morning. She has noted that if she looks down or looks up she can bring on the dizziness which is a spinning sensation. The patient has oscillopsia, no definite double vision with the vertigo. The patient may have some occasional headaches during the day over the last couple weeks. She denies any slurred speech, difficulty swallowing, numbness or weakness of extremities. She does have some gait instability associated with the vertigo. She recently has had a urinary tract infection, placed on an antibiotic within the last day or 2. The patient comes to this office for an evaluation. She has had a change in hearing in the left ear recently, she went to get her hearing aid checked. She denies any ear pain  REVIEW OF SYSTEMS: Out of a complete 14 system review of symptoms, the patient complains only of the following symptoms, and all other reviewed systems are negative.  Joint pain, back pain, aching muscles, walking difficulty, neck pain, neck stiffness, moles, itching, facial droop, memory loss, cough, shortness of breath  ALLERGIES: Allergies  Allergen Reactions  . Dilantin [Phenytoin Sodium Extended] Hives  . Phenobarbital Hives          HOME MEDICATIONS: Outpatient Medications Prior to Visit  Medication  Sig Dispense Refill  . allopurinol (ZYLOPRIM) 100 MG tablet Take 100 mg by mouth daily.    [name] aspirin 81 MG tablet Take 81 mg by mouth daily.    . Coenzyme Q10 (CO Q 10 PO) Take by mouth.    . colchicine 0.6 MG tablet Take 0.6 mg by mouth. Reported on 03/18/2016    . ELIQUIS 2.5 MG TABS tablet TAKE 1 TABLET BY MOUTH TWICE A DAY 60 tablet 5  . ferrous sulfate 325 (65 FE) MG  tablet Take 1 tablet (325 mg total) by mouth every Monday, Wednesday, and Friday. 30 tablet 2  . hydrochlorothiazide (HYDRODIURIL) 12.5 MG tablet Take 1 tablet (12.5 mg total) by mouth daily. 30 tablet 11  . KLOR-CON 10 10 MEQ tablet Take 10 mEq by mouth 2 (two) times daily.  5  . meclizine (ANTIVERT) 25 MG tablet TAKE 1 TABLET(S) 3 TIMES A DAY BY ORAL ROUTE AS NEEDED.  2  . Multiple Vitamins-Minerals (MULTIVITAMIN PO) Take by mouth daily.    . nitroGLYCERIN (NITROLINGUAL) 0.4 MG/SPRAY spray Place 3 sprays under the tongue every 5 (five) minutes x 3 doses as needed for chest pain.    [name] omeprazole (PRILOSEC) 20 MG capsule Take 20 mg by mouth daily.    . pravastatin (PRAVACHOL) 20 MG tablet Take 20 mg by mouth at bedtime.    . topiramate (TOPAMAX) 50 MG tablet Take 50 mg by mouth 2 (two) times daily.    . traMADol (ULTRAM) 50 MG tablet Take 1 tablet (50 mg total) by mouth every 6 (six) hours as needed for moderate pain. 30 tablet 0  . verapamil (CALAN-SR) 120 MG CR tablet Take 120 mg by mouth at bedtime.    . Vitamin D, Ergocalciferol, (DRISDOL) 50000 UNITS CAPS capsule Take 1 capsule by mouth. Twice weekly  3  . ELIQUIS 2.5 MG TABS tablet TAKE 1 TABLET BY MOUTH TWICE A DAY 60 tablet 5   No facility-administered medications prior to visit.     PAST MEDICAL HISTORY: Past Medical History:  Diagnosis Date  . ACL (anterior cruciate ligament) rupture    Left  . Acute MI   . Arthritis   . Back pain    Secondary to arthritis  . Benign paroxysmal positional vertigo 03/18/2016  . Brain tumor ([place])    On right side. Removed in 1989  . Bruit    Duplex doppler 03/28/05 Mildly abnormal. *Right subclavian artery: Less than 50% diameter reduction.  . Chest pain    2D Echo 04/21/08 EF = >55%. Moderate tricuspid regurgitation. Persantine Myovoew stress test 04/21/08 EF = 81%, no evidence of ischemia noted.  . Chronic anticoagulation    PAF  . Chronic edema    Mild LE edema  . Chronic kidney disease     Mild, stage 2  . Coronary artery disease   . Coronary atherosclerosis    Minimal  . Dyslipidemia   . GERD (gastroesophageal reflux disease)   . Hyperlipidemia    On pravastatin  . Memory difficulty 07/13/2014  . Meningioma ([place])    Resection in 1998  . Mild depression ([place])   . Ovarian cyst    Benign. Had a salpiingoophorectomy in 2005.  [name] PAF (paroxysmal atrial fibrillation) (HCC)    On coumadin. St. Jude PPM (serial [id number]) inserted 2010  . Paroxysmal atrial flutter ([place])   . Seizure ([place]) 2006  . Severe obesity ([place])   . Sick sinus syndrome ([place])   . Sinus node dysfunction (HCC)  S/P implantation of a dual-chamber permanent pacemaker in 2010  . SOB (shortness of breath) on exertion    Class II. Catheterization 04/30/10 showed mild CAD with mildly elevated right heart pressures.   . Tachycardia-bradycardia syndrome ([place])    S/P implantation of dual-chamber permanent pacemaker in 2010  . Trochlear nerve palsy    Right  . Vertigo     PAST SURGICAL HISTORY: Past Surgical History:  Procedure Laterality Date  . Brain tumor removal  1989  . CARDIAC CATHETERIZATION  04/30/10   Showed mild CAD with mildly elevated right heart pressures. (Dr. Elisabeth Cara)  . CRANIOTOMY  1989   (Brain tumor) With gamma knife procedure as well.  [name] KNEE SURGERY     Bilateral, arthroscopic  . PACEMAKER INSERTION  04/27/09   Implanted by Dr. Elisabeth Cara. St. Jude Accent DR (serial [phone])    FAMILY HISTORY: Family History  Problem Relation Age of Onset  . Stroke Mother   . Heart disease Father   . Heart disease Sister   . Heart disease Brother   . Heart disease Brother   . Heart disease Brother   . Heart disease Sister   . Heart disease Sister     SOCIAL HISTORY: Social History   Social History  . Marital status: Divorced    Spouse name: N/A  . Number of children: 3  . Years of education: N/A   Occupational History  .      Retired   Social History Main Topics  . Smoking status:  Former Smoker    Years: 20.00    Types: Cigarettes    Quit date: 12/23/2007  . Smokeless tobacco: Never Used  . Alcohol use No  . Drug use: No  . Sexual activity: Not on file   Other Topics Concern  . Not on file   Social History Narrative  . No narrative on file      PHYSICAL EXAM  Vitals:   02/03/17 0752  BP: (!) 168/79  Pulse: 63  Weight: 224 lb 9.6 oz (101.9 kg)  Height: 5\' 8"  (1.727 m)   Body mass index is 34.15 kg/m.  MMSE - Mini Mental State Exam 02/03/2017 07/30/2016 07/10/2015  Orientation to time 4 5 5   Orientation to Place 4 5 5   Registration 3 3 3   Attention/ Calculation 5 5 5   Recall 3 3 3   Language- name 2 objects 2 2 2   Language- repeat 1 1 1   Language- follow 3 step command 3 3 3   Language- read & follow direction 1 1 1   Write a sentence 1 1 1   Copy design 1 1 1   Total score 28 30 30      Generalized: Well developed, in no acute distress   Neurological examination  Mentation: Alert oriented to time, place, history taking. Follows all commands speech and language fluent Cranial nerve II-XII: Pupils were equal round reactive to light. Extraocular movements were full, visual field were full on confrontational test. Facial sensation and strength were normal.Facial asymmetry on the left. Uvula tongue midline. Head turning and shoulder shrug  were normal and symmetric. Motor: The motor testing reveals 5 over 5 strength of all 4 extremities. Good symmetric motor tone is noted throughout.  Sensory: Sensory testing is intact to soft touch on all 4 extremities. No evidence of extinction is noted.  Coordination: Cerebellar testing reveals good finger-nose-finger and heel-to-shin bilaterally.  Gait and station: Gait is slow and slightly unsteady. Tandem gait not attempted. Reflexes: Deep tendon reflexes are  symmetric and normal bilaterally.   DIAGNOSTIC DATA (LABS, IMAGING, TESTING) - I reviewed patient records, labs, notes, testing and imaging myself where  available.  CT head without contrast 03/22/2016: IMPRESSION:  Abnormal CT scan of the head showing right temporal bony defect with adjacent encephalomalacia consistent with prior history of meningioma resection. There are mild changes of chronic microvascular ischemia and generalized cerebral atrophy. No acute abnormalities are noted. Overall no significant change compared with previous CT scan of the head dated 10/17/2014.    ASSESSMENT AND PLAN 83 y.o. year old female  has a past medical history of ACL (anterior cruciate ligament) rupture; Acute MI; Arthritis; Back pain; Benign paroxysmal positional vertigo (03/18/2016); Brain tumor ([place]); Bruit; Chest pain; Chronic anticoagulation; Chronic edema; Chronic kidney disease; Coronary artery disease; Coronary atherosclerosis; Dyslipidemia; GERD (gastroesophageal reflux disease); Hyperlipidemia; Memory difficulty (07/13/2014); Meningioma ([place]); Mild depression ([place]); Ovarian cyst; PAF (paroxysmal atrial fibrillation) ([place]); Paroxysmal atrial flutter ([place]); Seizure ([place]) (2006); Severe obesity ([place]); Sick sinus syndrome ([place]); Sinus node dysfunction (HCC); SOB (shortness of breath) on exertion; Tachycardia-bradycardia syndrome ([place]); Trochlear nerve palsy; and Vertigo. here with:  1. Seizures 2. History of meningioma  Overall the patient is doing well. She will continue on Topamax 50 mg twice a day. The patient's memory score is stable. Her cognition changes could be due to Topamax. The patient does not want to try another medication. We will consider repeating CT of the head at the next visit. Her physical exam is relatively unremarkable. She will follow-up in 6 months with Dr. [name].    Ward Givens, MSN, NP-C 02/03/2017, 8:04 AM [facility] [address] [place], [place] [address] [phone]

## 2017-04-01 ENCOUNTER — Telehealth: Payer: Self-pay | Admitting: Cardiology

## 2017-04-01 ENCOUNTER — Encounter: Payer: Medicare Other | Admitting: *Deleted

## 2017-04-01 NOTE — Telephone Encounter (Signed)
Spoke with pt and reminded pt of remote transmission that is due today. Pt verbalized understanding.   

## 2017-04-03 ENCOUNTER — Encounter: Payer: Self-pay | Admitting: Cardiology

## 2017-04-09 ENCOUNTER — Ambulatory Visit (INDEPENDENT_AMBULATORY_CARE_PROVIDER_SITE_OTHER): Payer: Medicare Other | Admitting: *Deleted

## 2017-04-09 DIAGNOSIS — I495 Sick sinus syndrome: Secondary | ICD-10-CM

## 2017-04-10 ENCOUNTER — Encounter: Payer: Self-pay | Admitting: Cardiology

## 2017-04-10 NOTE — Progress Notes (Signed)
Remote pacemaker transmission.   

## 2017-04-16 LAB — CUP PACEART REMOTE DEVICE CHECK
Implantable Lead Implant Date: 20100507
Implantable Lead Location: 753860
Implantable Pulse Generator Implant Date: 20100507
Lead Channel Impedance Value: 460 Ohm
Lead Channel Pacing Threshold Amplitude: 1.125 V
Lead Channel Pacing Threshold Pulse Width: 0.4 ms
Lead Channel Sensing Intrinsic Amplitude: 12 mV
Lead Channel Sensing Intrinsic Amplitude: 5 mV
Lead Channel Setting Pacing Amplitude: 1.625
Lead Channel Setting Sensing Sensitivity: 2 mV
MDC IDC LEAD IMPLANT DT: 20100507
MDC IDC LEAD LOCATION: 753859
MDC IDC MSMT LEADCHNL RA IMPEDANCE VALUE: 330 Ohm
MDC IDC MSMT LEADCHNL RA PACING THRESHOLD AMPLITUDE: 0.75 V
MDC IDC MSMT LEADCHNL RV PACING THRESHOLD PULSEWIDTH: 0.4 ms
MDC IDC PG SERIAL: 2261146
MDC IDC SESS DTM: 20180426133334
MDC IDC SET LEADCHNL RV PACING AMPLITUDE: 1.375
MDC IDC SET LEADCHNL RV PACING PULSEWIDTH: 0.4 ms
MDC IDC STAT BRADY RA PERCENT PACED: 85 %
MDC IDC STAT BRADY RV PERCENT PACED: 3 %
Pulse Gen Model: 2210

## 2017-06-15 ENCOUNTER — Ambulatory Visit (INDEPENDENT_AMBULATORY_CARE_PROVIDER_SITE_OTHER): Payer: Medicare Other

## 2017-06-15 ENCOUNTER — Encounter: Payer: Self-pay | Admitting: Podiatry

## 2017-06-15 ENCOUNTER — Ambulatory Visit (INDEPENDENT_AMBULATORY_CARE_PROVIDER_SITE_OTHER): Payer: Medicare Other | Admitting: Podiatry

## 2017-06-15 DIAGNOSIS — M2042 Other hammer toe(s) (acquired), left foot: Secondary | ICD-10-CM

## 2017-06-15 DIAGNOSIS — M79604 Pain in right leg: Secondary | ICD-10-CM

## 2017-06-15 DIAGNOSIS — B351 Tinea unguium: Secondary | ICD-10-CM

## 2017-06-15 DIAGNOSIS — M79605 Pain in left leg: Secondary | ICD-10-CM | POA: Diagnosis not present

## 2017-06-15 DIAGNOSIS — M79676 Pain in unspecified toe(s): Secondary | ICD-10-CM

## 2017-06-15 NOTE — Progress Notes (Signed)
Subjective:    Patient ID: [name], female   DOB: 83 y.o.   MRN: [id number]   HPI patient presents stating the third toe on her left foot has been bothering her and she wanted checked and she has nails that she cannot cut and they get thick and painful and she is on Coumadin and is not allowed to cut toenails    Review of Systems  All other systems reviewed and are negative.       Objective:  Physical Exam  Cardiovascular: Intact distal pulses.   Musculoskeletal: Normal range of motion.  Neurological: She is alert.  Skin: Skin is warm and dry.  Nursing note and vitals reviewed.  neurovascular status mildly diminished both DP PT pulses and sharp [place] vibratory. Patient has moderate hammertoe deformity left over right with the third digit showing irritation and has nail disease 1-5 both feet with incurvation of the nailbeds that are painful when palpated     Assessment:    Digital deformity third left probably related to hammertoe along with mycotic nail infection and pain 1-5 both feet with mild vascular disease with no significant claudication symptoms when questioned     Plan:    H&P conditions reviewed and at this point recommended nail debridement 1-5 both feet along with education and review of x-rays concerning left foot. Reappoint for routine care or earlier if needed  X-rays indicate that the digits do have significant hammertoe deformity second and third toes left

## 2017-06-15 NOTE — Progress Notes (Signed)
   Subjective:    Patient ID: [name], female    DOB: 01-Nov-1932, 83 y.o.   MRN: [id number]  HPI Chief Complaint  Patient presents with  . Debridement    Bilateral nail trim  . Toe Pain    Left foot; 3rd toe; pt stated, "Sometimes toe is numb and can't feel it"; x1 month      Review of Systems  Endocrine: Positive for cold intolerance.  Genitourinary: Positive for frequency and urgency.  Musculoskeletal: Positive for arthralgias, back pain, gait problem and myalgias.  Neurological: Positive for dizziness and weakness.  Hematological: Bruises/bleeds easily.  All other systems reviewed and are negative.      Objective:   Physical Exam        Assessment & Plan:

## 2017-06-21 ENCOUNTER — Other Ambulatory Visit: Payer: Self-pay | Admitting: Cardiovascular Disease

## 2017-06-22 NOTE — Telephone Encounter (Signed)
Rx(s) sent to pharmacy electronically.  

## 2017-07-01 ENCOUNTER — Ambulatory Visit (INDEPENDENT_AMBULATORY_CARE_PROVIDER_SITE_OTHER): Payer: Medicare Other | Admitting: Cardiovascular Disease

## 2017-07-01 ENCOUNTER — Encounter: Payer: Self-pay | Admitting: Cardiovascular Disease

## 2017-07-01 VITALS — BP 132/76 | HR 61 | Ht 66.0 in | Wt 227.6 lb

## 2017-07-01 DIAGNOSIS — I10 Essential (primary) hypertension: Secondary | ICD-10-CM

## 2017-07-01 DIAGNOSIS — Z950 Presence of cardiac pacemaker: Secondary | ICD-10-CM | POA: Diagnosis not present

## 2017-07-01 DIAGNOSIS — I495 Sick sinus syndrome: Secondary | ICD-10-CM

## 2017-07-01 DIAGNOSIS — Z7901 Long term (current) use of anticoagulants: Secondary | ICD-10-CM

## 2017-07-01 DIAGNOSIS — E669 Obesity, unspecified: Secondary | ICD-10-CM

## 2017-07-01 DIAGNOSIS — E66811 Obesity, class 1: Secondary | ICD-10-CM

## 2017-07-01 DIAGNOSIS — I480 Paroxysmal atrial fibrillation: Secondary | ICD-10-CM

## 2017-07-01 NOTE — Patient Instructions (Signed)
Dr Sallyanne Kuster recommends that you continue on your current medications as directed. Please refer to the Current Medication list given to you today.  Remote monitoring is used to monitor your Pacemaker of ICD from home. This monitoring reduces the number of office visits required to check your device to one time per year. It allows [name] to keep an eye on the functioning of your device to ensure it is working properly. You are scheduled for a device check from home on Wednesday, October 10th, 2018. You may send your transmission at any time that day. If you have a wireless device, the transmission will be sent automatically. After your physician reviews your transmission, you will receive a postcard with your next transmission date.  Dr Sallyanne Kuster recommends that you schedule a follow-up appointment in 6 months with a pacemaker check. You will receive a reminder letter in the mail two months in advance. If you don't receive a letter, please call our office to schedule the follow-up appointment.  If you need a refill on your cardiac medications before your next appointment, please call your pharmacy.

## 2017-07-01 NOTE — Progress Notes (Signed)
Cardiology Office Note    Date:  07/01/2017   ID:  [name], DOB 1932-04-09, MRN [id number]  PCP:  Willey Blade, MD  Cardiologist:   Sanda Klein, MD   No chief complaint on file.   History of Present Illness:  [name] is a 83 y.o. female here for follow-up of her dual-chamber permanent pacemaker. The device was implanted in 2010 for tachycardia-bradycardia syndrome (paroxysmal atrial tachycardia, paroxysmal atrial fibrillation, symptomatic sinus bradycardia).   She is accompanied today by her daughter, just as she was at her last appointment. As before, they have a low-grade simmering complete about her activity level. Her daughter thinks her mother should be more physically active. The patient reports that she tries to use her stationary bike for 15 minutes twice a day. In practice she also never exercises twice a day. She has had problems with her gait stability but has not had any falls.  The patient specifically denies any chest pain at rest exertion, dyspnea at rest or with exertion, orthopnea, paroxysmal nocturnal dyspnea, syncope, palpitations, focal neurological deficits, intermittent claudication, lower extremity edema, unexplained weight gain, cough, hemoptysis or wheezing. Orthostatic dizziness remains an occasional problem.  Pacemaker interrogation shows normal findings. Her St. Jude Accent device was implanted in 2010 and still has almost 4 years of estimated longevity is 84% atrial pacing and only 3% ventricular pacing. The burden of atrial fibrillation a similar to previous trends at roughly 1.4%. The longest episode was 7.5 hours in duration. The most recent episode occurred yesterday for about 48 minutes with a ventricular rate of 60-90 bpm. Generally ventricular rate is very well controlled during the episodes of atrial fibrillation, with rare episodes of high ventricular rate when she has a more organized, flutter-like atrial rhythm. Lead  parameters remain excellent. Interestingly, she had 2 episodes of noise reversion that occurred this morning, after she had checked into the clinic. They may have been related to performance over 12-lead ECG, although none of our other patients have had this problem. She does have 2088 leads. Both episodes of noise reversion were extremely brief and did not have a significant impact on the device function.  She has never had embolic events that we are aware of. She does have a history of seizures associated with resection of multiple meningiomas in the past.  She has no evidence of structural heart disease by previous echo and nuclear stress testing, had minimal CAD on angiography 2011. She has preserved left ventricular systolic function. There are subtle signs of diastolic function abnormality, but no clinical heart failure.   Past Medical History:  Diagnosis Date  . ACL (anterior cruciate ligament) rupture    Left  . Acute MI ([place])   . Arthritis   . Back pain    Secondary to arthritis  . Benign paroxysmal positional vertigo 03/18/2016  . Brain tumor ([place])    On right side. Removed in 1989  . Bruit    Duplex doppler 03/28/05 Mildly abnormal. *Right subclavian artery: Less than 50% diameter reduction.  . Chest pain    2D Echo 04/21/08 EF = >55%. Moderate tricuspid regurgitation. Persantine Myovoew stress test 04/21/08 EF = 81%, no evidence of ischemia noted.  . Chronic anticoagulation    PAF  . Chronic edema    Mild LE edema  . Chronic kidney disease    Mild, stage 2  . Coronary artery disease   . Coronary atherosclerosis    Minimal  . Dyslipidemia   .  GERD (gastroesophageal reflux disease)   . Hyperlipidemia    On pravastatin  . Memory difficulty 07/13/2014  . Meningioma ([place])    Resection in 1998  . Mild depression ([place])   . Ovarian cyst    Benign. Had a salpiingoophorectomy in 2005.  [name] PAF (paroxysmal atrial fibrillation) (HCC)    On coumadin. St. Jude PPM (serial [id number])  inserted 2010  . Paroxysmal atrial flutter ([place])   . Seizure ([place]) 2006  . Severe obesity ([place])   . Sick sinus syndrome ([place])   . Sinus node dysfunction (HCC)    S/P implantation of a dual-chamber permanent pacemaker in 2010  . SOB (shortness of breath) on exertion    Class II. Catheterization 04/30/10 showed mild CAD with mildly elevated right heart pressures.   . Tachycardia-bradycardia syndrome ([place])    S/P implantation of dual-chamber permanent pacemaker in 2010  . Trochlear nerve palsy    Right  . Vertigo     Past Surgical History:  Procedure Laterality Date  . Brain tumor removal  1989  . CARDIAC CATHETERIZATION  04/30/10   Showed mild CAD with mildly elevated right heart pressures. (Dr. Elisabeth Cara)  . CRANIOTOMY  1989   (Brain tumor) With gamma knife procedure as well.  [name] KNEE SURGERY     Bilateral, arthroscopic  . PACEMAKER INSERTION  04/27/09   Implanted by Dr. Elisabeth Cara. St. Jude Accent DR (serial [id number])    Current Medications: Outpatient Medications Prior to Visit  Medication Sig Dispense Refill  . allopurinol (ZYLOPRIM) 100 MG tablet Take 100 mg by mouth daily.    [name] aspirin 81 MG tablet Take 81 mg by mouth daily.    . Coenzyme Q10 (CO Q 10 PO) Take by mouth.    . colchicine 0.6 MG tablet Take 0.6 mg by mouth. Reported on 03/18/2016    . ferrous sulfate 325 (65 FE) MG tablet Take 1 tablet (325 mg total) by mouth every Monday, Wednesday, and Friday. 30 tablet 2  . hydrochlorothiazide (HYDRODIURIL) 12.5 MG tablet TAKE 1 TABLET (12.5 MG TOTAL) BY MOUTH DAILY. 30 tablet 8  . KLOR-CON 10 10 MEQ tablet Take 10 mEq by mouth 2 (two) times daily.  5  . Multiple Vitamins-Minerals (MULTIVITAMIN PO) Take by mouth daily.    . nitroGLYCERIN (NITROLINGUAL) 0.4 MG/SPRAY spray Place 3 sprays under the tongue every 5 (five) minutes x 3 doses as needed for chest pain.    [name] omeprazole (PRILOSEC) 20 MG capsule Take 20 mg by mouth daily.    . pravastatin (PRAVACHOL) 20 MG tablet Take 20 mg by  mouth at bedtime.    . topiramate (TOPAMAX) 50 MG tablet Take 50 mg by mouth 2 (two) times daily.    . traMADol (ULTRAM) 50 MG tablet Take 1 tablet (50 mg total) by mouth every 6 (six) hours as needed for moderate pain. 30 tablet 0  . verapamil (CALAN-SR) 120 MG CR tablet Take 120 mg by mouth at bedtime.    . Vitamin D, Ergocalciferol, (DRISDOL) 50000 UNITS CAPS capsule Take 1 capsule by mouth. Twice weekly  3  . warfarin (COUMADIN) 4 MG tablet Take 4 mg by mouth daily.    . meclizine (ANTIVERT) 25 MG tablet TAKE 1 TABLET(S) 3 TIMES A DAY BY ORAL ROUTE AS NEEDED.  2   No facility-administered medications prior to visit.      Allergies:   Dilantin [phenytoin sodium extended] and Phenobarbital   Social History   Social History  . Marital status:  Divorced    Spouse name: N/A  . Number of children: 3  . Years of education: N/A   Occupational History  .      Retired   Social History Main Topics  . Smoking status: Former Smoker    Years: 20.00    Types: Cigarettes    Quit date: 12/23/2007  . Smokeless tobacco: Never Used  . Alcohol use No  . Drug use: No  . Sexual activity: Not Asked   Other Topics Concern  . None   Social History Narrative  . None     Family History:  The patient's family history includes Heart disease in her brother, brother, brother, father, sister, sister, and sister; Stroke in her mother.   ROS:   Please see the history of present illness.    ROS All other systems reviewed and are negative.   PHYSICAL EXAM:   VS:  BP 132/76 (BP Location: Left Arm, Patient Position: Sitting, Cuff Size: Large)   Pulse 61   Ht 5\' 6"  (1.676 m)   Wt 227 lb 9.6 oz (103.2 kg)   BMI 36.74 kg/m    General: Alert, oriented x3, no distress. Moderately obese Head: no evidence of trauma, PERRL, EOMI, no exophtalmos or lid lag, no myxedema, no xanthelasma; normal ears, nose and oropharynx Neck: normal jugular venous pulsations and no hepatojugular reflux; brisk carotid pulses  without delay and no carotid bruits Chest: clear to auscultation, no signs of consolidation by percussion or palpation, normal fremitus, symmetrical and full respiratory excursions, healthy left subclavian pacemaker site Cardiovascular: normal position and quality of the apical impulse, regular rhythm, normal first and second heart sounds, no murmurs, rubs or gallops Abdomen: no tenderness or distention, no masses by palpation, no abnormal pulsatility or arterial bruits, normal bowel sounds, no hepatosplenomegaly Extremities: no clubbing, cyanosis or edema; 2+ radial, ulnar and brachial pulses bilaterally; 2+ right femoral, posterior tibial and dorsalis pedis pulses; 2+ left femoral, posterior tibial and dorsalis pedis pulses; no subclavian or femoral bruits Neurological: grossly nonfocal Psych: euthymic mood, full affect  Wt Readings from Last 3 Encounters:  07/01/17 227 lb 9.6 oz (103.2 kg)  02/03/17 224 lb 9.6 oz (101.9 kg)  12/31/16 224 lb 9.6 oz (101.9 kg)      Studies/Labs Reviewed:   EKG:  EKG is ordered today.  The ekg ordered today demonstrates Atrial paced, ventricular sensed, AV delay 230 ms, QTC 422 ms  Recent Labs: No results found for requested labs within last 8760 hours.    ASSESSMENT:    1. Paroxysmal atrial fibrillation (HCC)   2. Long term current use of anticoagulant therapy   3. SSS (sick sinus syndrome) ([place])   4. Pacemaker - dual chamber St. Jude RF device, implanted May 2010   5. Essential hypertension   6. Obesity (BMI 30.0-34.9)      PLAN:  In order of problems listed above:  1. Paroxysmal atrial fibrillation: low burden, unchanged from previous trans-, good rate control for the most part, appropriately anticoagulated. CHADSVasc 4 (age 31, HTN, gender). 2. Anticoagulation well-tolerated without bleeding complications 3. Sinus node dysfunction with appropriate heart rate histogram distribution for her level of activity. No changes made to device sensor  settings today.  4. PPM: Continue remote pacemaker downloads every 3 months and yearly office visit 5. Essential hypertension with fair control. Less orthostatic hypotension after we decreased the dose of diuretic. Still has occasional dizziness but not as big of a complaint. 6. Obesity , continue efforts  at weight loss and regular exercise. Despite her daughter's best efforts, I don't think Mrs. Swartzentruber is truly interested in losing weight or exercising more. I encouraged her to maintain physical activity to improve her strength and gait stability.    Medication Adjustments/Labs and Tests Ordered: Current medicines are reviewed at length with the patient today.  Concerns regarding medicines are outlined above.  Medication changes, Labs and Tests ordered today are listed in the Patient Instructions below. Patient Instructions  Dr Sallyanne Kuster recommends that you continue on your current medications as directed. Please refer to the Current Medication list given to you today.  Remote monitoring is used to monitor your Pacemaker of ICD from home. This monitoring reduces the number of office visits required to check your device to one time per year. It allows [name] to keep an eye on the functioning of your device to ensure it is working properly. You are scheduled for a device check from home on Wednesday, October 10th, 2018. You may send your transmission at any time that day. If you have a wireless device, the transmission will be sent automatically. After your physician reviews your transmission, you will receive a postcard with your next transmission date.  Dr Sallyanne Kuster recommends that you schedule a follow-up appointment in 6 months with a pacemaker check. You will receive a reminder letter in the mail two months in advance. If you don't receive a letter, please call our office to schedule the follow-up appointment.  If you need a refill on your cardiac medications before your next appointment, please call  your pharmacy.    Signed, Sanda Klein, MD  07/01/2017 1:38 PM    [facility] [place], [place], [place]  [address] Phone: [phone]; Fax: [phone]

## 2017-07-08 ENCOUNTER — Other Ambulatory Visit: Payer: Self-pay | Admitting: Cardiovascular Disease

## 2017-07-09 ENCOUNTER — Ambulatory Visit (INDEPENDENT_AMBULATORY_CARE_PROVIDER_SITE_OTHER): Payer: Medicare Other | Admitting: *Deleted

## 2017-07-09 DIAGNOSIS — I495 Sick sinus syndrome: Secondary | ICD-10-CM

## 2017-07-09 LAB — CUP PACEART REMOTE DEVICE CHECK
Battery Remaining Longevity: 44 mo
Brady Statistic AP VP Percent: 1.6 %
Brady Statistic AP VS Percent: 90 %
Brady Statistic AS VP Percent: 1 %
Brady Statistic RA Percent Paced: 78 %
Brady Statistic RV Percent Paced: 2.5 %
Date Time Interrogation Session: 20180719064350
Implantable Lead Implant Date: 20100507
Implantable Lead Location: 753859
Lead Channel Impedance Value: 300 Ohm
Lead Channel Impedance Value: 440 Ohm
Lead Channel Pacing Threshold Amplitude: 1.125 V
Lead Channel Pacing Threshold Pulse Width: 0.4 ms
Lead Channel Sensing Intrinsic Amplitude: 12 mV
Lead Channel Setting Pacing Amplitude: 1.75 V
Lead Channel Setting Pacing Pulse Width: 0.4 ms
MDC IDC LEAD IMPLANT DT: 20100507
MDC IDC LEAD LOCATION: 753860
MDC IDC MSMT BATTERY REMAINING PERCENTAGE: 40 %
MDC IDC MSMT BATTERY VOLTAGE: 2.84 V
MDC IDC MSMT LEADCHNL RA PACING THRESHOLD AMPLITUDE: 0.75 V
MDC IDC MSMT LEADCHNL RA SENSING INTR AMPL: 5 mV
MDC IDC MSMT LEADCHNL RV PACING THRESHOLD PULSEWIDTH: 0.4 ms
MDC IDC PG IMPLANT DT: 20100507
MDC IDC SET LEADCHNL RV PACING AMPLITUDE: 1.375
MDC IDC SET LEADCHNL RV SENSING SENSITIVITY: 2 mV
MDC IDC STAT BRADY AS VS PERCENT: 7.6 %
Pulse Gen Model: 2210
Pulse Gen Serial Number: 2261146

## 2017-07-09 NOTE — Progress Notes (Signed)
Remote pacemaker transmission.   

## 2017-07-16 ENCOUNTER — Encounter: Payer: Self-pay | Admitting: Cardiology

## 2017-08-05 ENCOUNTER — Ambulatory Visit (INDEPENDENT_AMBULATORY_CARE_PROVIDER_SITE_OTHER): Payer: Medicare Other | Admitting: Neurology

## 2017-08-05 ENCOUNTER — Encounter (INDEPENDENT_AMBULATORY_CARE_PROVIDER_SITE_OTHER): Payer: Self-pay

## 2017-08-05 ENCOUNTER — Encounter: Payer: Self-pay | Admitting: Neurology

## 2017-08-05 VITALS — BP 152/86 | HR 66 | Ht 66.0 in | Wt 225.0 lb

## 2017-08-05 DIAGNOSIS — D429 Neoplasm of uncertain behavior of meninges, unspecified: Secondary | ICD-10-CM | POA: Diagnosis not present

## 2017-08-05 DIAGNOSIS — R569 Unspecified convulsions: Secondary | ICD-10-CM

## 2017-08-05 DIAGNOSIS — R413 Other amnesia: Secondary | ICD-10-CM | POA: Diagnosis not present

## 2017-08-05 NOTE — Progress Notes (Signed)
Reason for visit: Seizures  [name] is an 83 y.o. female  History of present illness:  [name] is an 83 year old right-handed black female with a history of meningiomas, status post resection. The patient has some residual facial asymmetry with a lower left eyebrow than the right. The patient is on Topamax taking 50 mg twice daily, she is tolerating the medication well, but she does report some problems with memory, she has difficulty remembering names for people. The patient recently has gotten a cold, she has sinus drainage, she has frontal pressure and some dizziness and vertigo with this, she has chills and night sweats. She recently was placed on a Z-Pak by her primary care physician. The patient has arthritis affecting the shoulders and she has some discomfort in the left knee, occasionally she has difficulty walking with the left leg. The patient denies any falls. She does have a cardiac pacemaker. She reports some shortness of breath associated with her cold. She returns for an evaluation.  Past Medical History:  Diagnosis Date  . ACL (anterior cruciate ligament) rupture    Left  . Acute MI ([place])   . Arthritis   . Back pain    Secondary to arthritis  . Benign paroxysmal positional vertigo 03/18/2016  . Brain tumor ([place])    On right side. Removed in 1989  . Bruit    Duplex doppler 03/28/05 Mildly abnormal. *Right subclavian artery: Less than 50% diameter reduction.  . Chest pain    2D Echo 04/21/08 EF = >55%. Moderate tricuspid regurgitation. Persantine Myovoew stress test 04/21/08 EF = 81%, no evidence of ischemia noted.  . Chronic anticoagulation    PAF  . Chronic edema    Mild LE edema  . Chronic kidney disease    Mild, stage 2  . Coronary artery disease   . Coronary atherosclerosis    Minimal  . Dyslipidemia   . GERD (gastroesophageal reflux disease)   . Hyperlipidemia    On pravastatin  . Memory difficulty 07/13/2014  . Meningioma ([place])    Resection in  1998  . Mild depression ([place])   . Ovarian cyst    Benign. Had a salpiingoophorectomy in 2005.  [name] PAF (paroxysmal atrial fibrillation) (HCC)    On coumadin. St. Jude PPM (serial [id number]) inserted 2010  . Paroxysmal atrial flutter ([place])   . Seizure ([place]) 2006  . Severe obesity ([place])   . Sick sinus syndrome ([place])   . Sinus node dysfunction (HCC)    S/P implantation of a dual-chamber permanent pacemaker in 2010  . SOB (shortness of breath) on exertion    Class II. Catheterization 04/30/10 showed mild CAD with mildly elevated right heart pressures.   . Tachycardia-bradycardia syndrome ([place])    S/P implantation of dual-chamber permanent pacemaker in 2010  . Trochlear nerve palsy    Right  . Vertigo     Past Surgical History:  Procedure Laterality Date  . Brain tumor removal  1989  . CARDIAC CATHETERIZATION  04/30/10   Showed mild CAD with mildly elevated right heart pressures. (Dr. Elisabeth Cara)  . CRANIOTOMY  1989   (Brain tumor) With gamma knife procedure as well.  [name] KNEE SURGERY     Bilateral, arthroscopic  . PACEMAKER INSERTION  04/27/09   Implanted by Dr. Elisabeth Cara. St. Jude Accent DR (serial [id number] [phone])    Family History  Problem Relation Age of Onset  . Stroke Mother   . Heart disease Father   . Heart disease Sister   .  Heart disease Brother   . Heart disease Brother   . Heart disease Brother   . Heart disease Sister   . Heart disease Sister     Social history:  reports that she quit smoking about 9 years ago. Her smoking use included Cigarettes. She quit after 20.00 years of use. She has never used smokeless tobacco. She reports that she does not drink alcohol or use drugs.    Allergies  Allergen Reactions  . Dilantin [Phenytoin Sodium Extended] Hives  . Phenobarbital Hives          Medications:  Prior to Admission medications   Medication Sig Start Date End Date Taking? Authorizing Provider  allopurinol (ZYLOPRIM) 100 MG tablet Take 100 mg by mouth daily.   Yes  [provider]  azithromycin (ZITHROMAX) 250 MG tablet Zithromax Z-Pak 250 mg tablet  TAKE 2 TABLETS (500 MG) BY ORAL ROUTE ONCE DAILY FOR 1 DAY THEN 1 TABLET (250 MG) BY ORAL ROUTE ONCE DAILY FOR 4 DAYS   Yes [provider]  benzonatate (TESSALON PERLES) 100 MG capsule Tessalon Perles 100 mg capsule  Take 2 capsules 3 times a day by oral route as needed.   Yes [provider]  Coenzyme Q10 (CO Q 10 PO) Take by mouth.   Yes [provider]  colchicine 0.6 MG tablet Take 0.6 mg by mouth. Reported on 03/18/2016   Yes [provider]  ferrous sulfate 325 (65 FE) MG tablet Take 1 tablet (325 mg total) by mouth every Monday, Wednesday, and Friday. 05/05/16  Yes Croitoru, Mihai, MD  hydrochlorothiazide (HYDRODIURIL) 12.5 MG tablet TAKE 1 TABLET (12.5 MG TOTAL) BY MOUTH DAILY. 06/22/17  Yes Croitoru, Mihai, MD  KLOR-CON 10 10 MEQ tablet Take 10 mEq by mouth 2 (two) times daily. 12/17/16  Yes [provider]  Multiple Vitamins-Minerals (MULTIVITAMIN PO) Take by mouth daily.   Yes [provider]  nitroGLYCERIN (NITROLINGUAL) 0.4 MG/SPRAY spray Place 3 sprays under the tongue every 5 (five) minutes x 3 doses as needed for chest pain.   Yes [provider]  omeprazole (PRILOSEC) 20 MG capsule Take 20 mg by mouth daily.   Yes [provider]  pravastatin (PRAVACHOL) 20 MG tablet Take 20 mg by mouth at bedtime.   Yes [provider]  topiramate (TOPAMAX) 50 MG tablet Take 50 mg by mouth 2 (two) times daily.   Yes [provider]  traMADol (ULTRAM) 50 MG tablet Take 1 tablet (50 mg total) by mouth every 6 (six) hours as needed for moderate pain. 12/31/16  Yes Croitoru, Mihai, MD  verapamil (CALAN-SR) 120 MG CR tablet Take 120 mg by mouth at bedtime.   Yes [provider]  Vitamin D, Ergocalciferol, (DRISDOL) 50000 UNITS CAPS capsule Take 1 capsule by mouth. Twice weekly 10/30/14  Yes [provider]    warfarin (COUMADIN) 4 MG tablet Take 4 mg by mouth daily.   Yes [provider]    ROS:  Out of a complete 14 system review of symptoms, the patient complains only of the following symptoms, and all other reviewed systems are negative.  Dizziness Memory disturbance  Blood pressure (!) 152/86, pulse 66, height 5\' 6"  (1.676 m), weight 225 lb (102.1 kg), SpO2 96 %.  Physical Exam  General: The patient is alert and cooperative at the time of the examination.  Respiratory: Lung fields are clear.  Cardiovascular: Regular rate and rhythm, no murmurs or rubs are noted.  Neuromuscular: The patient  has restriction of elevation of the arms bilaterally, able to elevate the arms about 50.  Skin: No significant peripheral edema is noted.   Neurologic Exam  Mental status: The patient is alert and oriented x 3 at the time of the examination. The patient has apparent normal recent and remote memory, with an apparently normal attention span and concentration ability.   Cranial nerves: Facial symmetry is not present. The left eyebrow is lower than the right. Speech is normal, no aphasia or dysarthria is noted. Extraocular movements are full. Visual fields are full.  Motor: The patient has good strength in all 4 extremities.  Sensory examination: Soft touch sensation is symmetric on the face, arms, and legs.  Coordination: The patient has good finger-nose-finger and heel-to-shin bilaterally.  Gait and station: The patient has a slightly wide-based gait, the patient is able to ambulate independently. Tandem gait was not attempted. Romberg is negative. No drift is seen.  Reflexes: Deep tendon reflexes are symmetric, but are depressed.   Assessment/Plan:  1. History of multiple meningiomas  2. History seizures, well controlled  3. Gait disturbance  4. Mild memory disturbance  The patient last had CT scan of the head in 2017, we may consider getting another scan on her next  visit in 2019. The prior study was quite stable. The patient is doing well on her Topamax, she is having some problems with naming which may be related to the medication. The patient does not wish to switch to another anticonvulsant medication. She will follow-up in 6 months.  Jill Alexanders MD 08/05/2017 8:23 AM  [facility] [address] [place], [place] [address]  Phone [phone] Fax [phone]

## 2017-08-07 LAB — CUP PACEART INCLINIC DEVICE CHECK
Brady Statistic RA Percent Paced: 84 %
Date Time Interrogation Session: 20180817164338
Implantable Lead Implant Date: 20100507
Implantable Lead Location: 753859
Implantable Lead Location: 753860
Lead Channel Impedance Value: 510 Ohm
Lead Channel Pacing Threshold Amplitude: 0.75 V
Lead Channel Pacing Threshold Pulse Width: 0.4 ms
Lead Channel Sensing Intrinsic Amplitude: 12 mV
Lead Channel Sensing Intrinsic Amplitude: 5 mV
Lead Channel Setting Pacing Amplitude: 1.75 V
Lead Channel Setting Pacing Pulse Width: 0.4 ms
Lead Channel Setting Sensing Sensitivity: 2 mV
MDC IDC LEAD IMPLANT DT: 20100507
MDC IDC MSMT BATTERY REMAINING LONGEVITY: 45 mo
MDC IDC MSMT BATTERY REMAINING PERCENTAGE: 40 %
MDC IDC MSMT BATTERY VOLTAGE: 2.84 V
MDC IDC MSMT LEADCHNL RA IMPEDANCE VALUE: 330 Ohm
MDC IDC MSMT LEADCHNL RA PACING THRESHOLD PULSEWIDTH: 0.4 ms
MDC IDC MSMT LEADCHNL RV PACING THRESHOLD AMPLITUDE: 1.25 V
MDC IDC PG IMPLANT DT: 20100507
MDC IDC SET LEADCHNL RV PACING AMPLITUDE: 1.375
MDC IDC STAT BRADY RV PERCENT PACED: 3 %
Pulse Gen Model: 2210
Pulse Gen Serial Number: 2261146

## 2017-09-11 ENCOUNTER — Other Ambulatory Visit: Payer: Self-pay | Admitting: Cardiovascular Disease

## 2017-10-07 ENCOUNTER — Telehealth: Payer: Self-pay | Admitting: Cardiovascular Disease

## 2017-10-07 NOTE — Telephone Encounter (Signed)
New message   Pt c/o Shortness Of Breath: STAT if SOB developed within the last 24 hours or pt is noticeably SOB on the phone  1. Are you currently SOB (can you hear that pt is SOB on the phone)? Yes  2. How long have you been experiencing SOB? A few days  3. Are you SOB when sitting or when up moving around? both  4. Are you currently experiencing any other symptoms? lightheaded  Pt wanted appt. Scheduled tomorrow at [place] with Kerin Ransom.

## 2017-10-08 ENCOUNTER — Telehealth: Payer: Self-pay

## 2017-10-08 ENCOUNTER — Ambulatory Visit (INDEPENDENT_AMBULATORY_CARE_PROVIDER_SITE_OTHER): Payer: Medicare Other | Admitting: *Deleted

## 2017-10-08 ENCOUNTER — Ambulatory Visit (INDEPENDENT_AMBULATORY_CARE_PROVIDER_SITE_OTHER): Payer: Medicare Other | Admitting: Cardiology

## 2017-10-08 ENCOUNTER — Encounter: Payer: Self-pay | Admitting: Cardiology

## 2017-10-08 DIAGNOSIS — I480 Paroxysmal atrial fibrillation: Secondary | ICD-10-CM

## 2017-10-08 DIAGNOSIS — Z7901 Long term (current) use of anticoagulants: Secondary | ICD-10-CM

## 2017-10-08 DIAGNOSIS — I2510 Atherosclerotic heart disease of native coronary artery without angina pectoris: Secondary | ICD-10-CM

## 2017-10-08 DIAGNOSIS — N183 Chronic kidney disease, stage 3 unspecified: Secondary | ICD-10-CM

## 2017-10-08 DIAGNOSIS — R0602 Shortness of breath: Secondary | ICD-10-CM

## 2017-10-08 DIAGNOSIS — Z950 Presence of cardiac pacemaker: Secondary | ICD-10-CM

## 2017-10-08 DIAGNOSIS — I10 Essential (primary) hypertension: Secondary | ICD-10-CM | POA: Diagnosis not present

## 2017-10-08 DIAGNOSIS — I495 Sick sinus syndrome: Secondary | ICD-10-CM | POA: Diagnosis not present

## 2017-10-08 DIAGNOSIS — R569 Unspecified convulsions: Secondary | ICD-10-CM

## 2017-10-08 NOTE — Assessment & Plan Note (Signed)
Followed by Dr. [name]. H/O meningioma and seizures

## 2017-10-08 NOTE — Patient Instructions (Addendum)
Medication Instructions:  Your physician recommends that you continue on your current medications as directed. Please refer to the Current Medication list given to you today.  Labwork: None   Testing/Procedures: Your physician has requested that you have an echocardiogram. Echocardiography is a painless test that uses sound waves to create images of your heart. It provides your doctor with information about the size and shape of your heart and how well your heart's chambers and valves are working. This procedure takes approximately one hour. There are no restrictions for this procedure.  [facility]  Follow-Up: Your physician recommends that you schedule a follow-up appointment in: 1 Month with Dr Sallyanne Kuster  Any Other Special Instructions Will Be Listed Below (If Applicable).  1. Have your primary care doctor check your INR for Once a week for 4 weeks  2. We will contact you later today in regards to having your pacer checked   If you need a refill on your cardiac medications before your next appointment, please call your pharmacy.

## 2017-10-08 NOTE — Assessment & Plan Note (Signed)
Increasing burden on last check

## 2017-10-08 NOTE — Assessment & Plan Note (Signed)
PTVDP for PAF/SSS-> 4 yrs EOL

## 2017-10-08 NOTE — Assessment & Plan Note (Signed)
Pt seen in the office today for complaints of increased exertional fatigue and dyspnea for the past 3-4 weeks.

## 2017-10-08 NOTE — Assessment & Plan Note (Signed)
Controlled.  

## 2017-10-08 NOTE — Telephone Encounter (Signed)
-----   Message from Erlene Quan, [place] sent at 10/08/2017  2:15 PM EDT ----- Could you let Ms Voong know they were able to determine her atrial fibrillation burden without an formal pacer check. We'll review her echo then discuss with dr Sallyanne Kuster.  Kerin Ransom PA-C 10/08/2017 2:29 PM

## 2017-10-08 NOTE — Telephone Encounter (Signed)
Called patient, reviewed recommendations. Patient verbalized understanding and agreed with plan.

## 2017-10-08 NOTE — Assessment & Plan Note (Signed)
Minor non obstructive disease at cath in 2011 Negative Myoview 2015 Normal LVF by echo 2016

## 2017-10-08 NOTE — Progress Notes (Signed)
10/08/2017 [name]   12-Aug-1932  [id number]  Primary Physician Willey Blade, MD Primary Cardiologist: Dr Sallyanne Kuster  HPI:  83 y/o AA female her with her daughter. Pt complains of increasing fatigue and dyspnea with exertion for the past 3-4 weeks.She was treated for bronchitis by her PCP with a Z pak but this did not help. She denies orthopnea and her wgt is unchanged. She has had labs done by her PCP and I have requested those.    Current Outpatient Prescriptions  Medication Sig Dispense Refill  . allopurinol (ZYLOPRIM) 100 MG tablet Take 100 mg by mouth daily.    . Cholecalciferol (VITAMIN D3) 1000 units CAPS Take 1 capsule by mouth daily.    . Coenzyme Q10 (CO Q 10 PO) Take by mouth.    . colchicine 0.6 MG tablet Take 0.6 mg by mouth. Reported on 03/18/2016    . ferrous sulfate 325 (65 FE) MG tablet TAKE 1 TABLET (325 MG TOTAL) BY MOUTH EVERY MONDAY, WEDNESDAY, AND FRIDAY. 30 tablet 7  . hydrochlorothiazide (HYDRODIURIL) 12.5 MG tablet TAKE 1 TABLET (12.5 MG TOTAL) BY MOUTH DAILY. 30 tablet 8  . KLOR-CON 10 10 MEQ tablet Take 10 mEq by mouth 2 (two) times daily.  5  . Multiple Vitamins-Minerals (MULTIVITAMIN PO) Take by mouth daily.    . nitroGLYCERIN (NITROLINGUAL) 0.4 MG/SPRAY spray Place 3 sprays under the tongue every 5 (five) minutes x 3 doses as needed for chest pain.    [name] omeprazole (PRILOSEC) 20 MG capsule Take 20 mg by mouth daily.    . pravastatin (PRAVACHOL) 20 MG tablet Take 20 mg by mouth at bedtime.    . topiramate (TOPAMAX) 50 MG tablet Take 50 mg by mouth 2 (two) times daily.    . traMADol (ULTRAM) 50 MG tablet Take 1 tablet (50 mg total) by mouth every 6 (six) hours as needed for moderate pain. 30 tablet 0  . verapamil (CALAN-SR) 120 MG CR tablet Take 120 mg by mouth at bedtime.    [name] warfarin (COUMADIN) 4 MG tablet Take 4 mg by mouth daily.     No current facility-administered medications for this visit.     Allergies  Allergen Reactions  .  Dilantin [Phenytoin Sodium Extended] Hives  . Phenobarbital Hives          Past Medical History:  Diagnosis Date  . ACL (anterior cruciate ligament) rupture    Left  . Acute MI ([place])   . Arthritis   . Back pain    Secondary to arthritis  . Benign paroxysmal positional vertigo 03/18/2016  . Brain tumor ([place])    On right side. Removed in 1989  . Bruit    Duplex doppler 03/28/05 Mildly abnormal. *Right subclavian artery: Less than 50% diameter reduction.  . Chest pain    2D Echo 04/21/08 EF = >55%. Moderate tricuspid regurgitation. Persantine Myovoew stress test 04/21/08 EF = 81%, no evidence of ischemia noted.  . Chronic anticoagulation    PAF  . Chronic edema    Mild LE edema  . Chronic kidney disease    Mild, stage 2  . Coronary artery disease   . Coronary atherosclerosis    Minimal  . Dyslipidemia   . GERD (gastroesophageal reflux disease)   . Hyperlipidemia    On pravastatin  . Memory difficulty 07/13/2014  . Meningioma ([place])    Resection in 1998  . Mild depression ([place])   . Ovarian cyst    Benign. Had a  salpiingoophorectomy in 2005.  [name] PAF (paroxysmal atrial fibrillation) (HCC)    On coumadin. St. Jude PPM (serial [id number]) inserted 2010  . Paroxysmal atrial flutter ([place])   . Seizure ([place]) 2006  . Severe obesity ([place])   . Sick sinus syndrome ([place])   . Sinus node dysfunction (HCC)    S/P implantation of a dual-chamber permanent pacemaker in 2010  . SOB (shortness of breath) on exertion    Class II. Catheterization 04/30/10 showed mild CAD with mildly elevated right heart pressures.   . Tachycardia-bradycardia syndrome ([place])    S/P implantation of dual-chamber permanent pacemaker in 2010  . Trochlear nerve palsy    Right  . Vertigo     Social History   Social History  . Marital status: Divorced    Spouse name: N/A  . Number of children: 3  . Years of education: N/A   Occupational History  .      Retired   Social History Main Topics  . Smoking status:  Former Smoker    Years: 20.00    Types: Cigarettes    Quit date: 12/23/2007  . Smokeless tobacco: Never Used  . Alcohol use No  . Drug use: No  . Sexual activity: Not on file   Other Topics Concern  . Not on file   Social History Narrative  . No narrative on file     Family History  Problem Relation Age of Onset  . Stroke Mother   . Heart disease Father   . Heart disease Sister   . Heart disease Brother   . Heart disease Brother   . Heart disease Brother   . Heart disease Sister   . Heart disease Sister      Review of Systems: General: negative for chills, fever, night sweats or weight changes.  Cardiovascular: negative for chest pain, dyspnea on exertion, edema, orthopnea, palpitations, paroxysmal nocturnal dyspnea or shortness of breath Dermatological: negative for rash Respiratory: negative for cough or wheezing Urologic: negative for hematuria Abdominal: negative for nausea, vomiting, diarrhea, bright red blood per rectum, melena, or hematemesis Neurologic: negative for visual changes, syncope, or dizziness All other systems reviewed and are otherwise negative except as noted above.    Blood pressure 138/76, pulse 68, height 5\' 5"  (1.651 m), weight 225 lb (102.1 kg).  General appearance: alert, cooperative, no distress and moderately obese Neck: no carotid bruit and no JVD Lungs: clear to auscultation bilaterally Heart: regular rate and rhythm Extremities: tender RLE "gout", no significant LE edema Skin: Skin color, texture, turgor normal. No rashes or lesions Neurologic: Grossly normal  EKG Paced, one PVC  ASSESSMENT AND PLAN:   Exertional shortness of breath Pt seen in the office today for complaints of increased exertional fatigue and dyspnea for the past 3-4 weeks.  Paroxysmal atrial fibrillation (HCC) Increasing burden on last check  Pacemaker - dual chamber St. Jude RF device, implanted May 2010 PTVDP for PAF/SSS-> 4 yrs EOL  Seizure  [facility]) Followed by Dr. [name]. H/O meningioma and seizures  HTN (hypertension) Controlled  CKD (chronic kidney disease) stage 3, GFR 30-59 ml/min (HCC) Last SCr was 1.5 in Feb 2017  Long term current use of anticoagulant therapy Coumadin Rx- CHADS VASC=4  CAD (coronary artery disease) Minor non obstructive disease at cath in 2011 Negative Myoview 2015 Normal LVF by echo 2016   PLAN  I think her symptoms may be from atrial fibrillation. I will try and arrange for a pacemaker check to determine her AF  burden. I'll also check a 2D echo. The pt's daughter tells me the pt's INR is low -1.7 (followed by Dr Karlton Lemon). I will review options with Dr Recardo Evangelist after I confirm her underlying rhythm and review her labs done by her PCP.   Kerin Ransom PA-C 10/08/2017 9:44 AM

## 2017-10-08 NOTE — Progress Notes (Signed)
Remote pacemaker transmission.   

## 2017-10-08 NOTE — Assessment & Plan Note (Signed)
Last SCr was 1.5 in Feb 2017

## 2017-10-08 NOTE — Assessment & Plan Note (Signed)
Coumadin Rx- CHADS VASC= 4 

## 2017-10-13 ENCOUNTER — Telehealth: Payer: Self-pay | Admitting: Cardiovascular Disease

## 2017-10-13 NOTE — Telephone Encounter (Signed)
New Message      Pt returning call to [facility] about having her device checked when she comes in for her echo?

## 2017-10-13 NOTE — Telephone Encounter (Signed)
Spoke with patient's daughter and explained the purpose of an AFib clinic appointment. Reviewed directions and gave contact information. Scheduled patient for 10/24 at 1330. Daughter verbalized understanding.

## 2017-10-13 NOTE — Telephone Encounter (Signed)
New Message  Anda Kraft; Nurse from [facility] call requesting to speak with RN about doing a remote transmission. Anda Kraft ask for the pt to receive the call back.

## 2017-10-13 NOTE — Telephone Encounter (Signed)
LVM for call back

## 2017-10-13 NOTE — Telephone Encounter (Signed)
Spoke with patient to reschedule afib clinic appt. Patient requested that I call her daughter claudette at [phone] to discuss appointment times.

## 2017-10-13 NOTE — Telephone Encounter (Signed)
Follow up     Pt called for 2nd call to [facility]

## 2017-10-14 ENCOUNTER — Encounter (HOSPITAL_COMMUNITY): Payer: Self-pay | Admitting: Nurse Practitioner

## 2017-10-14 ENCOUNTER — Ambulatory Visit (HOSPITAL_COMMUNITY)
Admission: RE | Admit: 2017-10-14 | Discharge: 2017-10-14 | Disposition: A | Discharge status: Home / Self Care | Payer: Medicare Other | Source: Ambulatory Visit | Attending: Nurse Practitioner | Admitting: Nurse Practitioner

## 2017-10-14 VITALS — BP 134/72 | HR 68 | Ht 65.0 in | Wt 221.0 lb

## 2017-10-14 DIAGNOSIS — I2510 Atherosclerotic heart disease of native coronary artery without angina pectoris: Secondary | ICD-10-CM | POA: Diagnosis not present

## 2017-10-14 DIAGNOSIS — Z950 Presence of cardiac pacemaker: Secondary | ICD-10-CM | POA: Insufficient documentation

## 2017-10-14 DIAGNOSIS — Z87891 Personal history of nicotine dependence: Secondary | ICD-10-CM | POA: Insufficient documentation

## 2017-10-14 DIAGNOSIS — I495 Sick sinus syndrome: Secondary | ICD-10-CM | POA: Diagnosis not present

## 2017-10-14 DIAGNOSIS — I480 Paroxysmal atrial fibrillation: Secondary | ICD-10-CM | POA: Diagnosis not present

## 2017-10-14 DIAGNOSIS — F329 Major depressive disorder, single episode, unspecified: Secondary | ICD-10-CM | POA: Insufficient documentation

## 2017-10-14 DIAGNOSIS — Z888 Allergy status to other drugs, medicaments and biological substances status: Secondary | ICD-10-CM | POA: Insufficient documentation

## 2017-10-14 DIAGNOSIS — N182 Chronic kidney disease, stage 2 (mild): Secondary | ICD-10-CM | POA: Insufficient documentation

## 2017-10-14 DIAGNOSIS — E785 Hyperlipidemia, unspecified: Secondary | ICD-10-CM | POA: Insufficient documentation

## 2017-10-14 DIAGNOSIS — I252 Old myocardial infarction: Secondary | ICD-10-CM | POA: Diagnosis not present

## 2017-10-14 DIAGNOSIS — M1990 Unspecified osteoarthritis, unspecified site: Secondary | ICD-10-CM | POA: Insufficient documentation

## 2017-10-14 DIAGNOSIS — Z79899 Other long term (current) drug therapy: Secondary | ICD-10-CM | POA: Diagnosis not present

## 2017-10-14 DIAGNOSIS — Z823 Family history of stroke: Secondary | ICD-10-CM | POA: Insufficient documentation

## 2017-10-14 DIAGNOSIS — R569 Unspecified convulsions: Secondary | ICD-10-CM | POA: Insufficient documentation

## 2017-10-14 DIAGNOSIS — Z8249 Family history of ischemic heart disease and other diseases of the circulatory system: Secondary | ICD-10-CM | POA: Insufficient documentation

## 2017-10-14 DIAGNOSIS — K219 Gastro-esophageal reflux disease without esophagitis: Secondary | ICD-10-CM | POA: Diagnosis not present

## 2017-10-14 DIAGNOSIS — Z7901 Long term (current) use of anticoagulants: Secondary | ICD-10-CM | POA: Insufficient documentation

## 2017-10-14 DIAGNOSIS — Z6836 Body mass index (BMI) 36.0-36.9, adult: Secondary | ICD-10-CM | POA: Diagnosis not present

## 2017-10-14 DIAGNOSIS — R413 Other amnesia: Secondary | ICD-10-CM | POA: Diagnosis not present

## 2017-10-14 MED ORDER — VERAPAMIL HCL ER 120 MG PO TBCR: 120.0000 mg | EXTENDED_RELEASE_TABLET | Freq: Two times a day (BID) | ORAL | 3 refills | Status: DC

## 2017-10-14 NOTE — Patient Instructions (Signed)
Your physician has recommended you make the following change in your medication:  1)increase Verapamil to 120mg  twice a day

## 2017-10-15 ENCOUNTER — Other Ambulatory Visit: Payer: Self-pay

## 2017-10-15 ENCOUNTER — Encounter: Payer: Self-pay | Admitting: Cardiology

## 2017-10-15 ENCOUNTER — Ambulatory Visit (HOSPITAL_COMMUNITY): Payer: Medicare Other | Attending: Cardiovascular Disease

## 2017-10-15 DIAGNOSIS — R0602 Shortness of breath: Secondary | ICD-10-CM

## 2017-10-15 DIAGNOSIS — R0600 Dyspnea, unspecified: Secondary | ICD-10-CM | POA: Diagnosis not present

## 2017-10-15 DIAGNOSIS — Z950 Presence of cardiac pacemaker: Secondary | ICD-10-CM | POA: Diagnosis present

## 2017-10-15 DIAGNOSIS — I10 Essential (primary) hypertension: Secondary | ICD-10-CM

## 2017-10-15 DIAGNOSIS — R569 Unspecified convulsions: Secondary | ICD-10-CM | POA: Diagnosis not present

## 2017-10-15 DIAGNOSIS — N183 Chronic kidney disease, stage 3 unspecified: Secondary | ICD-10-CM

## 2017-10-15 DIAGNOSIS — I2510 Atherosclerotic heart disease of native coronary artery without angina pectoris: Secondary | ICD-10-CM

## 2017-10-15 DIAGNOSIS — I129 Hypertensive chronic kidney disease with stage 1 through stage 4 chronic kidney disease, or unspecified chronic kidney disease: Secondary | ICD-10-CM | POA: Insufficient documentation

## 2017-10-15 DIAGNOSIS — I480 Paroxysmal atrial fibrillation: Secondary | ICD-10-CM

## 2017-10-15 DIAGNOSIS — I340 Nonrheumatic mitral (valve) insufficiency: Secondary | ICD-10-CM | POA: Insufficient documentation

## 2017-10-15 DIAGNOSIS — Z7901 Long term (current) use of anticoagulants: Secondary | ICD-10-CM

## 2017-10-15 NOTE — Progress Notes (Signed)
Primary Care Physician: Willey Blade, MD Referring Physician: Like Rosalyn Gess, [place] Cardiologist: Dr. Tonye Pearson [name] is a 83 y.o. female with a h/o paroxysmal afib, PPM for SSS, CAD, that is in the afib clinic for increased afib burden. PT does not feel the palpitations but has felt more tired over the last 3-4 weeks. She was recently treated for bronchitis. Remote checks have shown increase in burden. She is in SR today but still feels tired and has shortness of breath. She is here with her daughter and wish to stay conservative in the  approach.  Today, she denies symptoms of palpitations, chest pain, shortness of breath, orthopnea, PND, lower extremity edema, dizziness, presyncope, syncope, or neurologic sequela. The patient is tolerating medications without difficulties and is otherwise without complaint today.   Past Medical History:  Diagnosis Date  . ACL (anterior cruciate ligament) rupture    Left  . Acute MI ([place])   . Arthritis   . Back pain    Secondary to arthritis  . Benign paroxysmal positional vertigo 03/18/2016  . Brain tumor ([place])    On right side. Removed in 1989  . Bruit    Duplex doppler 03/28/05 Mildly abnormal. *Right subclavian artery: Less than 50% diameter reduction.  . Chest pain    2D Echo 04/21/08 EF = >55%. Moderate tricuspid regurgitation. Persantine Myovoew stress test 04/21/08 EF = 81%, no evidence of ischemia noted.  . Chronic anticoagulation    PAF  . Chronic edema    Mild LE edema  . Chronic kidney disease    Mild, stage 2  . Coronary artery disease   . Coronary atherosclerosis    Minimal  . Dyslipidemia   . GERD (gastroesophageal reflux disease)   . Hyperlipidemia    On pravastatin  . Memory difficulty 07/13/2014  . Meningioma ([place])    Resection in 1998  . Mild depression ([place])   . Ovarian cyst    Benign. Had a salpiingoophorectomy in 2005.  [name] PAF (paroxysmal atrial fibrillation) (HCC)    On coumadin. St. Jude PPM (serial  [id number]) inserted 2010  . Paroxysmal atrial flutter ([place])   . Seizure ([place]) 2006  . Severe obesity ([place])   . Sick sinus syndrome ([place])   . Sinus node dysfunction (HCC)    S/P implantation of a dual-chamber permanent pacemaker in 2010  . SOB (shortness of breath) on exertion    Class II. Catheterization 04/30/10 showed mild CAD with mildly elevated right heart pressures.   . Tachycardia-bradycardia syndrome ([place])    S/P implantation of dual-chamber permanent pacemaker in 2010  . Trochlear nerve palsy    Right  . Vertigo    Past Surgical History:  Procedure Laterality Date  . Brain tumor removal  1989  . CARDIAC CATHETERIZATION  04/30/10   Showed mild CAD with mildly elevated right heart pressures. (Dr. Elisabeth Cara)  . CRANIOTOMY  1989   (Brain tumor) With gamma knife procedure as well.  [name] KNEE SURGERY     Bilateral, arthroscopic  . PACEMAKER INSERTION  04/27/09   Implanted by Dr. Elisabeth Cara. St. Jude Accent DR (serial [id number])    Current Outpatient Prescriptions  Medication Sig Dispense Refill  . allopurinol (ZYLOPRIM) 100 MG tablet Take 100 mg by mouth daily.    . Cholecalciferol (VITAMIN D3) 1000 units CAPS Take 1 capsule by mouth daily.    . Coenzyme Q10 (CO Q 10 PO) Take by mouth.    . colchicine 0.6 MG tablet Take 0.6  mg by mouth. Reported on 03/18/2016    . ferrous sulfate 325 (65 FE) MG tablet TAKE 1 TABLET (325 MG TOTAL) BY MOUTH EVERY MONDAY, WEDNESDAY, AND FRIDAY. 30 tablet 7  . hydrochlorothiazide (HYDRODIURIL) 12.5 MG tablet TAKE 1 TABLET (12.5 MG TOTAL) BY MOUTH DAILY. 30 tablet 8  . KLOR-CON 10 10 MEQ tablet Take 10 mEq by mouth 2 (two) times daily.  5  . Multiple Vitamins-Minerals (MULTIVITAMIN PO) Take by mouth daily.    [name] omeprazole (PRILOSEC) 20 MG capsule Take 20 mg by mouth daily.    . pravastatin (PRAVACHOL) 20 MG tablet Take 20 mg by mouth at bedtime.    . topiramate (TOPAMAX) 50 MG tablet Take 50 mg by mouth 2 (two) times daily.    . verapamil (CALAN-SR) 120 MG CR  tablet Take 1 tablet (120 mg total) by mouth 2 (two) times daily. 60 tablet 3  . warfarin (COUMADIN) 4 MG tablet Take 4 mg by mouth daily.    . nitroGLYCERIN (NITROLINGUAL) 0.4 MG/SPRAY spray Place 3 sprays under the tongue every 5 (five) minutes x 3 doses as needed for chest pain.    . traMADol (ULTRAM) 50 MG tablet Take 1 tablet (50 mg total) by mouth every 6 (six) hours as needed for moderate pain. (Patient not taking: Reported on 10/14/2017) 30 tablet 0   No current facility-administered medications for this encounter.     Allergies  Allergen Reactions  . Dilantin [Phenytoin Sodium Extended] Hives  . Phenobarbital Hives          Social History   Social History  . Marital status: Divorced    Spouse name: N/A  . Number of children: 3  . Years of education: N/A   Occupational History  .      Retired   Social History Main Topics  . Smoking status: Former Smoker    Years: 20.00    Types: Cigarettes    Quit date: 12/23/2007  . Smokeless tobacco: Never Used  . Alcohol use No  . Drug use: No  . Sexual activity: Not on file   Other Topics Concern  . Not on file   Social History Narrative  . No narrative on file    Family History  Problem Relation Age of Onset  . Stroke Mother   . Heart disease Father   . Heart disease Sister   . Heart disease Brother   . Heart disease Brother   . Heart disease Brother   . Heart disease Sister   . Heart disease Sister     ROS- All systems are reviewed and negative except as per the HPI above  Physical Exam: Vitals:   10/14/17 1329  BP: 134/72  Pulse: 68  SpO2: 99%  Weight: 221 lb (100.2 kg)  Height: 5\' 5"  (1.651 m)   Wt Readings from Last 3 Encounters:  10/14/17 221 lb (100.2 kg)  10/08/17 225 lb (102.1 kg)  08/05/17 225 lb (102.1 kg)    Labs: Lab Results  Component Value Date   NA 140 02/07/2016   K 3.9 02/07/2016   CL 105 02/07/2016   CO2 20 02/07/2016   GLUCOSE 134 (H) 02/07/2016   BUN 26 (H) 02/07/2016    CREATININE 1.54 (H) 02/07/2016   CALCIUM 9.8 02/07/2016   PHOS 4.0 04/11/2009   MG 2.2 04/23/2009   Lab Results  Component Value Date   INR 2.9 02/13/2016   No results found for: CHOL, HDL, LDLCALC, TRIG   GEN- The  patient is well appearing, alert and oriented x 3 today.   Head- normocephalic, atraumatic Eyes-  Sclera clear, conjunctiva pink Ears- hearing intact Oropharynx- clear Neck- supple, no JVP Lymph- no cervical lymphadenopathy Lungs- Clear to ausculation bilaterally, normal work of breathing Heart- Regular rate and rhythm, no murmurs, rubs or gallops, PMI not laterally displaced GI- soft, NT, ND, + BS Extremities- no clubbing, cyanosis, or edema MS- no significant deformity or atrophy Skin- no rash or lesion Psych- euthymic mood, full affect Neuro- strength and sensation are intact  EKG-atrial paced rhythm with occassional premature complexes Epic records reviewed as well as remote report    Assessment and Plan: 1. Paroxymal afib Pt and daughter want to approach conservatively For now will increase verapamil to 120 mg bid to see if this will  lessen afib burden Continue warfarin for a chadsvasc score of at least 4  I will see back in 3 weeks with interrogation to see if afib burden has decreased If this is ineffective, will entertain using multaq but verapamil would have to be changed to Cardizem for  drug-drug interaction  Butch Penny C. Markees Carns, [facility] [address] [place], [place] [address] [phone]

## 2017-10-22 ENCOUNTER — Ambulatory Visit: Payer: Medicare Other | Admitting: Cardiovascular Disease

## 2017-10-27 ENCOUNTER — Encounter (HOSPITAL_COMMUNITY): Payer: Self-pay | Admitting: Nurse Practitioner

## 2017-10-27 ENCOUNTER — Ambulatory Visit (HOSPITAL_COMMUNITY)
Admission: RE | Admit: 2017-10-27 | Discharge: 2017-10-27 | Disposition: A | Discharge status: Home / Self Care | Payer: Medicare Other | Source: Ambulatory Visit | Attending: Nurse Practitioner | Admitting: Nurse Practitioner

## 2017-10-27 VITALS — BP 136/72 | HR 68 | Ht 65.0 in | Wt 220.0 lb

## 2017-10-27 DIAGNOSIS — Z823 Family history of stroke: Secondary | ICD-10-CM | POA: Diagnosis not present

## 2017-10-27 DIAGNOSIS — N189 Chronic kidney disease, unspecified: Secondary | ICD-10-CM | POA: Diagnosis not present

## 2017-10-27 DIAGNOSIS — I495 Sick sinus syndrome: Secondary | ICD-10-CM | POA: Insufficient documentation

## 2017-10-27 DIAGNOSIS — Z955 Presence of coronary angioplasty implant and graft: Secondary | ICD-10-CM | POA: Insufficient documentation

## 2017-10-27 DIAGNOSIS — Z79899 Other long term (current) drug therapy: Secondary | ICD-10-CM | POA: Insufficient documentation

## 2017-10-27 DIAGNOSIS — I252 Old myocardial infarction: Secondary | ICD-10-CM | POA: Insufficient documentation

## 2017-10-27 DIAGNOSIS — Z9889 Other specified postprocedural states: Secondary | ICD-10-CM | POA: Diagnosis not present

## 2017-10-27 DIAGNOSIS — Z6836 Body mass index (BMI) 36.0-36.9, adult: Secondary | ICD-10-CM | POA: Diagnosis not present

## 2017-10-27 DIAGNOSIS — N83209 Unspecified ovarian cyst, unspecified side: Secondary | ICD-10-CM | POA: Diagnosis not present

## 2017-10-27 DIAGNOSIS — E785 Hyperlipidemia, unspecified: Secondary | ICD-10-CM | POA: Insufficient documentation

## 2017-10-27 DIAGNOSIS — Z86011 Personal history of benign neoplasm of the brain: Secondary | ICD-10-CM | POA: Insufficient documentation

## 2017-10-27 DIAGNOSIS — I2510 Atherosclerotic heart disease of native coronary artery without angina pectoris: Secondary | ICD-10-CM | POA: Diagnosis not present

## 2017-10-27 DIAGNOSIS — Z950 Presence of cardiac pacemaker: Secondary | ICD-10-CM | POA: Insufficient documentation

## 2017-10-27 DIAGNOSIS — Z87891 Personal history of nicotine dependence: Secondary | ICD-10-CM | POA: Diagnosis not present

## 2017-10-27 DIAGNOSIS — Z7902 Long term (current) use of antithrombotics/antiplatelets: Secondary | ICD-10-CM | POA: Diagnosis not present

## 2017-10-27 DIAGNOSIS — K219 Gastro-esophageal reflux disease without esophagitis: Secondary | ICD-10-CM | POA: Insufficient documentation

## 2017-10-27 DIAGNOSIS — I4892 Unspecified atrial flutter: Secondary | ICD-10-CM | POA: Diagnosis not present

## 2017-10-27 DIAGNOSIS — Z888 Allergy status to other drugs, medicaments and biological substances status: Secondary | ICD-10-CM | POA: Insufficient documentation

## 2017-10-27 DIAGNOSIS — Z8249 Family history of ischemic heart disease and other diseases of the circulatory system: Secondary | ICD-10-CM | POA: Insufficient documentation

## 2017-10-27 DIAGNOSIS — M1990 Unspecified osteoarthritis, unspecified site: Secondary | ICD-10-CM | POA: Diagnosis not present

## 2017-10-27 DIAGNOSIS — I480 Paroxysmal atrial fibrillation: Secondary | ICD-10-CM | POA: Diagnosis not present

## 2017-10-27 DIAGNOSIS — H8110 Benign paroxysmal vertigo, unspecified ear: Secondary | ICD-10-CM | POA: Insufficient documentation

## 2017-10-27 NOTE — Progress Notes (Signed)
Primary Care Physician: Willey Blade, MD Referring Physician: Like Rosalyn Gess, [place] Cardiologist: Dr. Tonye Pearson [name] is a 83 y.o. female with a h/o paroxysmal afib, PPM for SSS, CAD, that is in the afib clinic for increased afib burden.  Has felt more tired over the last 3-4 weeks. She was  treated for bronchitis in that time frame.. Remote checks have shown increase in burden. She was in SR today but still feels tired and has shortness of breath. She is here with her daughter in the afib clinic 10/24. They both  wish to stay conservative in the approach.  F/u in afib clinic, 11/6. She states that she has been doing so much better since increase in verapamil to 120 mg bid. She has been able to ride exercise bike with better tolerance and does not note any irregularity ion heart rhythm.   Today, she denies symptoms of palpitations, chest pain, shortness of breath, orthopnea, PND, lower extremity edema, dizziness, presyncope, syncope, or neurologic sequela. The patient is tolerating medications without difficulties and is otherwise without complaint today.   Past Medical History:  Diagnosis Date  . ACL (anterior cruciate ligament) rupture    Left  . Acute MI ([place])   . Arthritis   . Back pain    Secondary to arthritis  . Benign paroxysmal positional vertigo 03/18/2016  . Brain tumor ([place])    On right side. Removed in 1989  . Bruit    Duplex doppler 03/28/05 Mildly abnormal. *Right subclavian artery: Less than 50% diameter reduction.  . Chest pain    2D Echo 04/21/08 EF = >55%. Moderate tricuspid regurgitation. Persantine Myovoew stress test 04/21/08 EF = 81%, no evidence of ischemia noted.  . Chronic anticoagulation    PAF  . Chronic edema    Mild LE edema  . Chronic kidney disease    Mild, stage 2  . Coronary artery disease   . Coronary atherosclerosis    Minimal  . Dyslipidemia   . GERD (gastroesophageal reflux disease)   . Hyperlipidemia    On pravastatin  . Memory  difficulty 07/13/2014  . Meningioma ([place])    Resection in 1998  . Mild depression ([place])   . Ovarian cyst    Benign. Had a salpiingoophorectomy in 2005.  [name] PAF (paroxysmal atrial fibrillation) (HCC)    On coumadin. St. Jude PPM (serial [id number]) inserted 2010  . Paroxysmal atrial flutter ([place])   . Seizure ([place]) 2006  . Severe obesity ([place])   . Sick sinus syndrome ([place])   . Sinus node dysfunction (HCC)    S/P implantation of a dual-chamber permanent pacemaker in 2010  . SOB (shortness of breath) on exertion    Class II. Catheterization 04/30/10 showed mild CAD with mildly elevated right heart pressures.   . Tachycardia-bradycardia syndrome ([place])    S/P implantation of dual-chamber permanent pacemaker in 2010  . Trochlear nerve palsy    Right  . Vertigo    Past Surgical History:  Procedure Laterality Date  . Brain tumor removal  1989  . CARDIAC CATHETERIZATION  04/30/10   Showed mild CAD with mildly elevated right heart pressures. (Dr. Elisabeth Cara)  . CRANIOTOMY  1989   (Brain tumor) With gamma knife procedure as well.  [name] KNEE SURGERY     Bilateral, arthroscopic  . PACEMAKER INSERTION  04/27/09   Implanted by Dr. Elisabeth Cara. St. Jude Accent DR (serial [id number])    Current Outpatient Medications  Medication Sig Dispense Refill  . allopurinol (  ZYLOPRIM) 100 MG tablet Take 100 mg by mouth daily.    . Cholecalciferol (VITAMIN D3) 1000 units CAPS Take 1 capsule by mouth daily.    . Coenzyme Q10 (CO Q 10 PO) Take by mouth.    . colchicine 0.6 MG tablet Take 0.6 mg by mouth. Reported on 03/18/2016    . ferrous sulfate 325 (65 FE) MG tablet TAKE 1 TABLET (325 MG TOTAL) BY MOUTH EVERY MONDAY, WEDNESDAY, AND FRIDAY. 30 tablet 7  . hydrochlorothiazide (HYDRODIURIL) 12.5 MG tablet TAKE 1 TABLET (12.5 MG TOTAL) BY MOUTH DAILY. 30 tablet 8  . KLOR-CON 10 10 MEQ tablet Take 10 mEq by mouth 2 (two) times daily.  5  . Multiple Vitamins-Minerals (MULTIVITAMIN PO) Take by mouth daily.    . nitroGLYCERIN  (NITROLINGUAL) 0.4 MG/SPRAY spray Place 3 sprays under the tongue every 5 (five) minutes x 3 doses as needed for chest pain.    [name] omeprazole (PRILOSEC) 20 MG capsule Take 20 mg by mouth daily.    . pravastatin (PRAVACHOL) 20 MG tablet Take 20 mg by mouth at bedtime.    . topiramate (TOPAMAX) 50 MG tablet Take 50 mg by mouth 2 (two) times daily.    . traMADol (ULTRAM) 50 MG tablet Take 1 tablet (50 mg total) by mouth every 6 (six) hours as needed for moderate pain. 30 tablet 0  . verapamil (CALAN-SR) 120 MG CR tablet Take 1 tablet (120 mg total) by mouth 2 (two) times daily. 60 tablet 3  . warfarin (COUMADIN) 4 MG tablet Take 4 mg by mouth daily.     No current facility-administered medications for this encounter.     Allergies  Allergen Reactions  . Dilantin [Phenytoin Sodium Extended] Hives  . Phenobarbital Hives          Social History   Socioeconomic History  . Marital status: Divorced    Spouse name: Not on file  . Number of children: 3  . Years of education: Not on file  . Highest education level: Not on file  Social Needs  . Financial resource strain: Not on file  . Food insecurity - worry: Not on file  . Food insecurity - inability: Not on file  . Transportation needs - medical: Not on file  . Transportation needs - non-medical: Not on file  Occupational History    Comment: Retired  Tobacco Use  . Smoking status: Former Smoker    Years: 20.00    Types: Cigarettes    Last attempt to quit: 12/23/2007    Years since quitting: 9.8  . Smokeless tobacco: Never Used  Substance and Sexual Activity  . Alcohol use: No    Alcohol/week: 0.0 oz  . Drug use: No  . Sexual activity: Not on file  Other Topics Concern  . Not on file  Social History Narrative  . Not on file    Family History  Problem Relation Age of Onset  . Stroke Mother   . Heart disease Father   . Heart disease Sister   . Heart disease Brother   . Heart disease Brother   . Heart disease Brother   .  Heart disease Sister   . Heart disease Sister     ROS- All systems are reviewed and negative except as per the HPI above  Physical Exam: Vitals:   10/27/17 1414  BP: 136/72  Pulse: 68  Weight: 220 lb (99.8 kg)  Height: 5\' 5"  (1.651 m)   Wt Readings from Last 3  Encounters:  10/27/17 220 lb (99.8 kg)  10/14/17 221 lb (100.2 kg)  10/08/17 225 lb (102.1 kg)    Labs: Lab Results  Component Value Date   NA 140 02/07/2016   K 3.9 02/07/2016   CL 105 02/07/2016   CO2 20 02/07/2016   GLUCOSE 134 (H) 02/07/2016   BUN 26 (H) 02/07/2016   CREATININE 1.54 (H) 02/07/2016   CALCIUM 9.8 02/07/2016   PHOS 4.0 04/11/2009   MG 2.2 04/23/2009   Lab Results  Component Value Date   INR 2.9 02/13/2016   No results found for: CHOL, HDL, LDLCALC, TRIG   GEN- The patient is well appearing, alert and oriented x 3 today.   Head- normocephalic, atraumatic Eyes-  Sclera clear, conjunctiva pink Ears- hearing intact Oropharynx- clear Neck- supple, no JVP Lymph- no cervical lymphadenopathy Lungs- Clear to ausculation bilaterally, normal work of breathing Heart- Regular rate and rhythm, no murmurs, rubs or gallops, PMI not laterally displaced GI- soft, NT, ND, + BS Extremities- no clubbing, cyanosis, or edema MS- no significant deformity or atrophy Skin- no rash or lesion Psych- euthymic mood, full affect Neuro- strength and sensation are intact  EKG-atrial paced rhythm with occassional premature complexes Epic records reviewed as well as remote report    Assessment and Plan: 1. Paroxymal afib Improved with increase in verapamil to 120 mg bid, currently no symptoms Was going to interrogate device but does f/u with Dr. Loletha Grayer 11/9 and can have done there if he feels is necessary  Continue warfarin for a chadsvasc score of at least 4  afib clinic as needed  Butch Penny C. Jeslin Bazinet, [facility] [address] [place], [place] [address] [phone]

## 2017-10-30 ENCOUNTER — Encounter: Payer: Self-pay | Admitting: Cardiovascular Disease

## 2017-10-30 ENCOUNTER — Ambulatory Visit: Payer: Medicare Other | Admitting: Cardiovascular Disease

## 2017-10-30 VITALS — BP 130/80 | HR 70 | Ht 65.0 in | Wt 221.0 lb

## 2017-10-30 DIAGNOSIS — Z7901 Long term (current) use of anticoagulants: Secondary | ICD-10-CM | POA: Diagnosis not present

## 2017-10-30 DIAGNOSIS — I480 Paroxysmal atrial fibrillation: Secondary | ICD-10-CM

## 2017-10-30 DIAGNOSIS — Z950 Presence of cardiac pacemaker: Secondary | ICD-10-CM | POA: Diagnosis not present

## 2017-10-30 DIAGNOSIS — I495 Sick sinus syndrome: Secondary | ICD-10-CM

## 2017-10-30 DIAGNOSIS — I10 Essential (primary) hypertension: Secondary | ICD-10-CM

## 2017-10-30 DIAGNOSIS — E669 Obesity, unspecified: Secondary | ICD-10-CM

## 2017-10-30 LAB — CUP PACEART REMOTE DEVICE CHECK
Battery Remaining Longevity: 41 mo
Battery Remaining Percentage: 35 %
Battery Voltage: 2.83 V
Brady Statistic AS VS Percent: 10 %
Implantable Lead Implant Date: 20100507
Implantable Lead Location: 753860
Implantable Pulse Generator Implant Date: 20100507
Lead Channel Pacing Threshold Pulse Width: 0.4 ms
Lead Channel Setting Pacing Amplitude: 1.625
Lead Channel Setting Sensing Sensitivity: 2 mV
MDC IDC LEAD IMPLANT DT: 20100507
MDC IDC LEAD LOCATION: 753859
MDC IDC MSMT LEADCHNL RA IMPEDANCE VALUE: 330 Ohm
MDC IDC MSMT LEADCHNL RA PACING THRESHOLD AMPLITUDE: 0.625 V
MDC IDC MSMT LEADCHNL RA PACING THRESHOLD PULSEWIDTH: 0.4 ms
MDC IDC MSMT LEADCHNL RA SENSING INTR AMPL: 5 mV
MDC IDC MSMT LEADCHNL RV IMPEDANCE VALUE: 540 Ohm
MDC IDC MSMT LEADCHNL RV PACING THRESHOLD AMPLITUDE: 1 V
MDC IDC MSMT LEADCHNL RV SENSING INTR AMPL: 12 mV
MDC IDC PG SERIAL: 2261146
MDC IDC SESS DTM: 20181018071300
MDC IDC SET LEADCHNL RV PACING AMPLITUDE: 1.25 V
MDC IDC SET LEADCHNL RV PACING PULSEWIDTH: 0.4 ms
MDC IDC STAT BRADY AP VP PERCENT: 1.3 %
MDC IDC STAT BRADY AP VS PERCENT: 87 %
MDC IDC STAT BRADY AS VP PERCENT: 1 %
MDC IDC STAT BRADY RA PERCENT PACED: 72 %
MDC IDC STAT BRADY RV PERCENT PACED: 2.6 %

## 2017-10-30 MED ORDER — VERAPAMIL HCL ER 120 MG PO TBCR: 120.0000 mg | EXTENDED_RELEASE_TABLET | Freq: Two times a day (BID) | ORAL | 3 refills | Status: DC

## 2017-10-30 NOTE — Patient Instructions (Signed)
Dr Sallyanne Kuster recommends that you continue on your current medications as directed. Please refer to the Current Medication list given to you today.  Remote monitoring is used to monitor your Pacemaker of ICD from home. This monitoring reduces the number of office visits required to check your device to one time per year. It allows [name] to keep an eye on the functioning of your device to ensure it is working properly. You are scheduled for a device check from home on Thursday, January 17th, 2019. You may send your transmission at any time that day. If you have a wireless device, the transmission will be sent automatically. After your physician reviews your transmission, you will receive a postcard with your next transmission date.  Dr Sallyanne Kuster recommends that you schedule a follow-up appointment in 6 months with a pacemaker check. You will receive a reminder letter in the mail two months in advance. If you don't receive a letter, please call our office to schedule the follow-up appointment.  If you need a refill on your cardiac medications before your next appointment, please call your pharmacy.

## 2017-10-30 NOTE — Progress Notes (Signed)
Cardiology Office Note    Date:  10/30/2017   ID:  ISADORE BOKHARI, DOB [birth year], MRN [id number]  PCP:  Willey Blade, MD  Cardiologist:   Sanda Klein, MD   Chief Complaint  Patient presents with  . Follow-up    History of Present Illness:  NIKA YAZZIE is a 83 y.o. female here for follow-up of her dual-chamber permanent pacemaker. The device was implanted in 2010 for tachycardia-bradycardia syndrome (paroxysmal atrial tachycardia, paroxysmal atrial fibrillation, symptomatic sinus bradycardia).   She had a protracted episode of gout as well as an episode of bronchitis and was given steroids.  I think this was partly due to the concern about colchicine-verapamil interaction.  Also, the gout was not responding as expected to the treatment with colchicine.  She had increasing palpitations as well as a disruption in her usually well controlled prothrombin time while taking a prednisone Dosepak.  She saw Roderic Palau in the A. fib clinic.  Her dose of verapamil was increased to 120 mg twice daily, with improved symptoms.  She is no longer taking steroids and her palpitations have subsided.  Recent pacemaker download confirms an increase in the overall burden of atrial fibrillation up to about 16% and some episodes of rapid ventricular response.  Things are now settling down.  The patient specifically denies any chest pain at rest exertion, dyspnea at rest or with exertion, orthopnea, paroxysmal nocturnal dyspnea, syncope, focal neurological deficits, intermittent claudication, lower extremity edema, unexplained weight gain, cough, hemoptysis or wheezing.  She has constipation.  She has never had embolic events that we are aware of. She does have a history of seizures associated with resection of multiple meningiomas in the past.  She has no evidence of structural heart disease by previous echo and nuclear stress testing, had minimal CAD on angiography 2011. She has preserved  left ventricular systolic function. There are subtle signs of diastolic function abnormality, but no clinical heart failure.   Past Medical History:  Diagnosis Date  . ACL (anterior cruciate ligament) rupture    Left  . Acute MI ([place])   . Arthritis   . Back pain    Secondary to arthritis  . Benign paroxysmal positional vertigo 03/18/2016  . Brain tumor ([place])    On right side. Removed in 1989  . Bruit    Duplex doppler 03/28/05 Mildly abnormal. *Right subclavian artery: Less than 50% diameter reduction.  . Chest pain    2D Echo 04/21/08 EF = >55%. Moderate tricuspid regurgitation. Persantine Myovoew stress test 04/21/08 EF = 81%, no evidence of ischemia noted.  . Chronic anticoagulation    PAF  . Chronic edema    Mild LE edema  . Chronic kidney disease    Mild, stage 2  . Coronary artery disease   . Coronary atherosclerosis    Minimal  . Dyslipidemia   . GERD (gastroesophageal reflux disease)   . Hyperlipidemia    On pravastatin  . Memory difficulty 07/13/2014  . Meningioma ([place])    Resection in 1998  . Mild depression ([place])   . Ovarian cyst    Benign. Had a salpiingoophorectomy in 2005.  [name] PAF (paroxysmal atrial fibrillation) (HCC)    On coumadin. St. Jude PPM (serial [id number]) inserted 2010  . Paroxysmal atrial flutter ([place])   . Seizure ([place]) 2006  . Severe obesity ([place])   . Sick sinus syndrome ([place])   . Sinus node dysfunction (HCC)    S/P implantation of a dual-chamber permanent pacemaker  in 2010  . SOB (shortness of breath) on exertion    Class II. Catheterization 04/30/10 showed mild CAD with mildly elevated right heart pressures.   . Tachycardia-bradycardia syndrome ([place])    S/P implantation of dual-chamber permanent pacemaker in 2010  . Trochlear nerve palsy    Right  . Vertigo     Past Surgical History:  Procedure Laterality Date  . Brain tumor removal  1989  . CARDIAC CATHETERIZATION  04/30/10   Showed mild CAD with mildly elevated right heart pressures. (Dr.  Elisabeth Cara)  . CRANIOTOMY  1989   (Brain tumor) With gamma knife procedure as well.  [name] KNEE SURGERY     Bilateral, arthroscopic  . PACEMAKER INSERTION  04/27/09   Implanted by Dr. Elisabeth Cara. St. Jude Accent DR (serial [id number])    Current Medications: Outpatient Medications Prior to Visit  Medication Sig Dispense Refill  . allopurinol (ZYLOPRIM) 100 MG tablet Take 100 mg by mouth daily.    . Cholecalciferol (VITAMIN D3) 1000 units CAPS Take 1 capsule by mouth daily.    . Coenzyme Q10 (CO Q 10 PO) Take by mouth.    . ferrous sulfate 325 (65 FE) MG tablet TAKE 1 TABLET (325 MG TOTAL) BY MOUTH EVERY MONDAY, WEDNESDAY, AND FRIDAY. 30 tablet 7  . hydrochlorothiazide (HYDRODIURIL) 12.5 MG tablet TAKE 1 TABLET (12.5 MG TOTAL) BY MOUTH DAILY. 30 tablet 8  . KLOR-CON 10 10 MEQ tablet Take 10 mEq by mouth 2 (two) times daily.  5  . Multiple Vitamins-Minerals (MULTIVITAMIN PO) Take by mouth daily.    . nitroGLYCERIN (NITROLINGUAL) 0.4 MG/SPRAY spray Place 3 sprays under the tongue every 5 (five) minutes x 3 doses as needed for chest pain.    [name] omeprazole (PRILOSEC) 20 MG capsule Take 20 mg by mouth daily.    . pravastatin (PRAVACHOL) 20 MG tablet Take 20 mg by mouth at bedtime.    . topiramate (TOPAMAX) 50 MG tablet Take 50 mg by mouth 2 (two) times daily.    . traMADol (ULTRAM) 50 MG tablet Take 1 tablet (50 mg total) by mouth every 6 (six) hours as needed for moderate pain. 30 tablet 0  . warfarin (COUMADIN) 4 MG tablet Take 4 mg by mouth daily.    . verapamil (CALAN-SR) 120 MG CR tablet Take 1 tablet (120 mg total) by mouth 2 (two) times daily. 60 tablet 3  . colchicine 0.6 MG tablet Take 0.6 mg by mouth. Reported on 03/18/2016     No facility-administered medications prior to visit.      Allergies:   Dilantin [phenytoin sodium extended] and Phenobarbital   Social History   Socioeconomic History  . Marital status: Divorced    Spouse name: None  . Number of children: 3  . Years of education:  None  . Highest education level: None  Social Needs  . Financial resource strain: None  . Food insecurity - worry: None  . Food insecurity - inability: None  . Transportation needs - medical: None  . Transportation needs - non-medical: None  Occupational History    Comment: Retired  Tobacco Use  . Smoking status: Former Smoker    Years: 20.00    Types: Cigarettes    Last attempt to quit: 12/23/2007    Years since quitting: 9.8  . Smokeless tobacco: Never Used  Substance and Sexual Activity  . Alcohol use: No    Alcohol/week: 0.0 oz  . Drug use: No  . Sexual activity: None  Other Topics  Concern  . None  Social History Narrative  . None     Family History:  The patient's family history includes Heart disease in her brother, brother, brother, father, sister, sister, and sister; Stroke in her mother.   ROS:   Please see the history of present illness.    ROS All other systems reviewed and are negative.   PHYSICAL EXAM:   VS:  BP 130/80   Pulse 70   Ht 5\' 5"  (1.651 m)   Wt 221 lb (100.2 kg)   BMI 36.78 kg/m      General: Alert, oriented x3, no distress, Moderately obese Head: no evidence of trauma, PERRL, EOMI, no exophtalmos or lid lag, no myxedema, no xanthelasma; normal ears, nose and oropharynx Neck: normal jugular venous pulsations and no hepatojugular reflux; brisk carotid pulses without delay and no carotid bruits Chest: clear to auscultation, no signs of consolidation by percussion or palpation, normal fremitus, symmetrical and full respiratory excursions.  healthy left subclavian pacemaker site Cardiovascular: normal position and quality of the apical impulse, regular rhythm, normal first and second heart sounds, no murmurs, rubs or gallops Abdomen: no tenderness or distention, no masses by palpation, no abnormal pulsatility or arterial bruits, normal bowel sounds, no hepatosplenomegaly Extremities: no clubbing, cyanosis or edema; 2+ radial, ulnar and brachial  pulses bilaterally; 2+ right femoral, posterior tibial and dorsalis pedis pulses; 2+ left femoral, posterior tibial and dorsalis pedis pulses; no subclavian or femoral bruits Neurological: grossly nonfocal Psych: Normal mood and affect   Wt Readings from Last 3 Encounters:  10/30/17 221 lb (100.2 kg)  10/27/17 220 lb (99.8 kg)  10/14/17 221 lb (100.2 kg)      Studies/Labs Reviewed:   EKG:  EKG is ordered today.  The ekg ordered today demonstrates Atrial paced, ventricular sensed, AV delay 230 ms, QTC 422 ms  Recent Labs: No results found for requested labs within last 8760 hours.    ASSESSMENT:    1. Paroxysmal atrial fibrillation (HCC)   2. Long term current use of anticoagulant therapy   3. SSS (sick sinus syndrome) ([place])   4. Pacemaker - dual chamber St. Jude RF device, implanted May 2010   5. Essential hypertension   6. Obesity (BMI 30.0-34.9)      PLAN:  In order of problems listed above:  1. Paroxysmal atrial fibrillation: Appears to be improving after a transient increase in arrhythmia burden related to respiratory infection and use of steroids,. CHADSVasc 4 (age 61, HTN, gender). 2. Anticoagulation well-tolerated without bleeding complications monitored by PCP, no bleeding complications.  She reports having a prothrombin time checked just yesterday but does not did have the report from her PCP. 3. Sinus node dysfunction with appropriate heart rate histogram distribution for her level of activity. No changes made to device sensor settings today.  4. PPM: Just had a pacemaker download recently, continue remote downloads every 3 months and yearly office visit 5. Essential hypertension with good blood pressure control.  Currently without symptoms of orthostatic hypotension.  There is probably some room to increase the verapamil if necessary for ventricular rate control.  Would like to avoid it due to problems with constipation if we can. 6. Obesity , continue efforts at  weight loss and regular exercise.  She is exercising for 7 minutes a day on her bicycle. 7. Gout: There is no absolute contraindication to use cochicine and verapamil simultaneously, but need to monitor for signs of colchicine toxicity.    Medication Adjustments/Labs and Tests  Ordered: Current medicines are reviewed at length with the patient today.  Concerns regarding medicines are outlined above.  Medication changes, Labs and Tests ordered today are listed in the Patient Instructions below. Patient Instructions  Dr Sallyanne Kuster recommends that you continue on your current medications as directed. Please refer to the Current Medication list given to you today.  Remote monitoring is used to monitor your Pacemaker of ICD from home. This monitoring reduces the number of office visits required to check your device to one time per year. It allows [name] to keep an eye on the functioning of your device to ensure it is working properly. You are scheduled for a device check from home on Thursday, January 17th, 2019. You may send your transmission at any time that day. If you have a wireless device, the transmission will be sent automatically. After your physician reviews your transmission, you will receive a postcard with your next transmission date.  Dr Sallyanne Kuster recommends that you schedule a follow-up appointment in 6 months with a pacemaker check. You will receive a reminder letter in the mail two months in advance. If you don't receive a letter, please call our office to schedule the follow-up appointment.  If you need a refill on your cardiac medications before your next appointment, please call your pharmacy.    Signed, Sanda Klein, MD  10/30/2017 12:17 PM    [address], [place], [place]  [address] Phone: [phone]; Fax: [phone]: There is

## 2018-01-07 ENCOUNTER — Ambulatory Visit (INDEPENDENT_AMBULATORY_CARE_PROVIDER_SITE_OTHER): Payer: Medicare Other | Admitting: *Deleted

## 2018-01-07 DIAGNOSIS — I495 Sick sinus syndrome: Secondary | ICD-10-CM | POA: Diagnosis not present

## 2018-01-07 NOTE — Progress Notes (Signed)
Remote pacemaker transmission.   

## 2018-01-08 ENCOUNTER — Encounter: Payer: Self-pay | Admitting: Cardiology

## 2018-01-12 LAB — CUP PACEART REMOTE DEVICE CHECK
Battery Remaining Percentage: 29 %
Battery Voltage: 2.81 V
Brady Statistic AP VP Percent: 1.1 %
Brady Statistic AS VS Percent: 9.7 %
Brady Statistic RV Percent Paced: 2.9 %
Date Time Interrogation Session: 20190117134504
Implantable Lead Implant Date: 20100507
Implantable Lead Location: 753859
Implantable Pulse Generator Implant Date: 20100507
Lead Channel Impedance Value: 480 Ohm
Lead Channel Pacing Threshold Amplitude: 0.625 V
Lead Channel Pacing Threshold Pulse Width: 0.4 ms
Lead Channel Sensing Intrinsic Amplitude: 5 mV
Lead Channel Setting Pacing Amplitude: 1.5 V
Lead Channel Setting Pacing Pulse Width: 0.4 ms
Lead Channel Setting Sensing Sensitivity: 2 mV
MDC IDC LEAD IMPLANT DT: 20100507
MDC IDC LEAD LOCATION: 753860
MDC IDC MSMT BATTERY REMAINING LONGEVITY: 34 mo
MDC IDC MSMT LEADCHNL RA IMPEDANCE VALUE: 300 Ohm
MDC IDC MSMT LEADCHNL RA PACING THRESHOLD PULSEWIDTH: 0.4 ms
MDC IDC MSMT LEADCHNL RV PACING THRESHOLD AMPLITUDE: 1.25 V
MDC IDC MSMT LEADCHNL RV SENSING INTR AMPL: 12 mV
MDC IDC SET LEADCHNL RA PACING AMPLITUDE: 1.625
MDC IDC STAT BRADY AP VS PERCENT: 88 %
MDC IDC STAT BRADY AS VP PERCENT: 1 %
MDC IDC STAT BRADY RA PERCENT PACED: 73 %
Pulse Gen Model: 2210
Pulse Gen Serial Number: 2261146

## 2018-02-09 ENCOUNTER — Encounter: Payer: Self-pay | Admitting: Adult Health

## 2018-02-09 ENCOUNTER — Ambulatory Visit: Payer: Medicare Other | Admitting: Adult Health

## 2018-02-09 VITALS — BP 175/84 | HR 64 | Ht 65.0 in | Wt 225.4 lb

## 2018-02-09 DIAGNOSIS — Z86011 Personal history of benign neoplasm of the brain: Secondary | ICD-10-CM

## 2018-02-09 DIAGNOSIS — R569 Unspecified convulsions: Secondary | ICD-10-CM

## 2018-02-09 DIAGNOSIS — H4911 Fourth [trochlear] nerve palsy, right eye: Secondary | ICD-10-CM

## 2018-02-09 NOTE — Progress Notes (Signed)
I have read the note, and I agree with the clinical assessment and plan.  Charles K Willis   

## 2018-02-09 NOTE — Patient Instructions (Signed)
Your Plan:  Continue Topamax CT head  If your symptoms worsen or you develop new symptoms please let us know.    Thank you for coming to see [name] at [facility]. I hope we have been able to provide you high quality care today.  You may receive a patient satisfaction survey over the next few weeks. We would appreciate your feedback and comments so that we may continue to improve ourselves and the health of our patients.

## 2018-02-09 NOTE — Progress Notes (Signed)
PATIENT: [name] DOB: 1932/07/18  REASON FOR VISIT: follow up HISTORY FROM: patient  HISTORY OF PRESENT ILLNESS: Today 02/09/18 [name] is an 84 year old female with a history of meningioma status post resection.  She returns today for follow-up.  Overall she states that she has done well.  She denies any seizure events.  She remains on Topamax and is tolerating it well.  She does report some double vision due to a right 4th cranial nerve palsy.  She reports because of her vision sometimes she has trouble with depth perception.  She denies any falls.  Her daughter lives with her.  She is able to do most things independently.  She returns today for an evaluation.  HISTORY 08/05/17: [name] is an 84 year old right-handed black female with a history of meningiomas, status post resection. The patient has some residual facial asymmetry with a lower left eyebrow than the right. The patient is on Topamax taking 50 mg twice daily, she is tolerating the medication well, but she does report some problems with memory, she has difficulty remembering names for people. The patient recently has gotten a cold, she has sinus drainage, she has frontal pressure and some dizziness and vertigo with this, she has chills and night sweats. She recently was placed on a Z-Pak by her primary care physician. The patient has arthritis affecting the shoulders and she has some discomfort in the left knee, occasionally she has difficulty walking with the left leg. The patient denies any falls. She does have a cardiac pacemaker. She reports some shortness of breath associated with her cold. She returns for an evaluation.  REVIEW OF SYSTEMS: Out of a complete 14 system review of symptoms, the patient complains only of the following symptoms, and all other reviewed systems are negative.  ALLERGIES: Allergies  Allergen Reactions  . Dilantin [Phenytoin Sodium Extended] Hives  . Phenobarbital Hives           HOME MEDICATIONS: Outpatient Medications Prior to Visit  Medication Sig Dispense Refill  . allopurinol (ZYLOPRIM) 100 MG tablet Take 100 mg by mouth daily.    . Cholecalciferol (VITAMIN D3) 1000 units CAPS Take 1 capsule by mouth daily.    . Coenzyme Q10 (CO Q 10 PO) Take by mouth.    . colchicine 0.6 MG tablet Take 0.6 mg by mouth. Reported on 03/18/2016    . ferrous sulfate 325 (65 FE) MG tablet TAKE 1 TABLET (325 MG TOTAL) BY MOUTH EVERY MONDAY, WEDNESDAY, AND FRIDAY. 30 tablet 7  . hydrochlorothiazide (HYDRODIURIL) 12.5 MG tablet TAKE 1 TABLET (12.5 MG TOTAL) BY MOUTH DAILY. 30 tablet 8  . KLOR-CON 10 10 MEQ tablet Take 10 mEq by mouth 2 (two) times daily.  5  . Multiple Vitamins-Minerals (MULTIVITAMIN PO) Take by mouth daily.    . nitroGLYCERIN (NITROLINGUAL) 0.4 MG/SPRAY spray Place 3 sprays under the tongue every 5 (five) minutes x 3 doses as needed for chest pain.    [name] omeprazole (PRILOSEC) 20 MG capsule Take 20 mg by mouth daily.    . pravastatin (PRAVACHOL) 20 MG tablet Take 20 mg by mouth at bedtime.    . topiramate (TOPAMAX) 50 MG tablet Take 50 mg by mouth 2 (two) times daily.    . traMADol (ULTRAM) 50 MG tablet Take 1 tablet (50 mg total) by mouth every 6 (six) hours as needed for moderate pain. 30 tablet 0  . verapamil (CALAN-SR) 120 MG CR tablet Take 1 tablet (120 mg total) 2 (  two) times daily by mouth. 180 tablet 3  . warfarin (COUMADIN) 4 MG tablet Take 4 mg by mouth daily.     No facility-administered medications prior to visit.     PAST MEDICAL HISTORY: Past Medical History:  Diagnosis Date  . ACL (anterior cruciate ligament) rupture    Left  . Acute MI ([place])   . Arthritis   . Back pain    Secondary to arthritis  . Benign paroxysmal positional vertigo 03/18/2016  . Brain tumor ([place])    On right side. Removed in 1989  . Bruit    Duplex doppler 03/28/05 Mildly abnormal. *Right subclavian artery: Less than 50% diameter reduction.  . Chest pain    2D Echo 04/21/08  EF = >55%. Moderate tricuspid regurgitation. Persantine Myovoew stress test 04/21/08 EF = 81%, no evidence of ischemia noted.  . Chronic anticoagulation    PAF  . Chronic edema    Mild LE edema  . Chronic kidney disease    Mild, stage 2  . Coronary artery disease   . Coronary atherosclerosis    Minimal  . Dyslipidemia   . GERD (gastroesophageal reflux disease)   . Hyperlipidemia    On pravastatin  . Memory difficulty 07/13/2014  . Meningioma ([place])    Resection in 1998  . Mild depression ([place])   . Ovarian cyst    Benign. Had a salpiingoophorectomy in 2005.  [name] PAF (paroxysmal atrial fibrillation) (HCC)    On coumadin. St. Jude PPM (serial [id number]) inserted 2010  . Paroxysmal atrial flutter ([place])   . Seizure ([place]) 2006  . Severe obesity ([place])   . Sick sinus syndrome ([place])   . Sinus node dysfunction (HCC)    S/P implantation of a dual-chamber permanent pacemaker in 2010  . SOB (shortness of breath) on exertion    Class II. Catheterization 04/30/10 showed mild CAD with mildly elevated right heart pressures.   . Tachycardia-bradycardia syndrome ([place])    S/P implantation of dual-chamber permanent pacemaker in 2010  . Trochlear nerve palsy    Right  . Vertigo     PAST SURGICAL HISTORY: Past Surgical History:  Procedure Laterality Date  . Brain tumor removal  1989  . CARDIAC CATHETERIZATION  04/30/10   Showed mild CAD with mildly elevated right heart pressures. (Dr. Elisabeth Cara)  . CRANIOTOMY  1989   (Brain tumor) With gamma knife procedure as well.  [name] KNEE SURGERY     Bilateral, arthroscopic  . PACEMAKER INSERTION  04/27/09   Implanted by Dr. Elisabeth Cara. St. Jude Accent DR (serial [id number])    FAMILY HISTORY: Family History  Problem Relation Age of Onset  . Stroke Mother   . Heart disease Father   . Heart disease Sister   . Heart disease Brother   . Heart disease Brother   . Heart disease Brother   . Heart disease Sister   . Heart disease Sister     SOCIAL HISTORY: Social  History   Socioeconomic History  . Marital status: Divorced    Spouse name: Not on file  . Number of children: 3  . Years of education: Not on file  . Highest education level: Not on file  Social Needs  . Financial resource strain: Not on file  . Food insecurity - worry: Not on file  . Food insecurity - inability: Not on file  . Transportation needs - medical: Not on file  . Transportation needs - non-medical: Not on file  Occupational History    Comment:  Retired  Tobacco Use  . Smoking status: Former Smoker    Years: 20.00    Types: Cigarettes    Last attempt to quit: 12/23/2007    Years since quitting: 10.1  . Smokeless tobacco: Never Used  Substance and Sexual Activity  . Alcohol use: No    Alcohol/week: 0.0 oz  . Drug use: No  . Sexual activity: Not on file  Other Topics Concern  . Not on file  Social History Narrative  . Not on file      PHYSICAL EXAM  Vitals:   02/09/18 0758 02/09/18 0812  BP: (!) 170/81 (!) 175/84  Pulse: 62 64  Weight: 225 lb 6.4 oz (102.2 kg)   Height: 5' 5"  (1.651 m)    Body mass index is 37.51 kg/m.  Generalized: Well developed, in no acute distress   Neurological examination  Mentation: Alert oriented to time, place, history taking. Follows all commands speech and language fluent Cranial nerve II-XII: Pupils were equal round reactive to light. Extraocular movements were full, visual field were full on confrontational test. Facial sensation and strength were normal. Uvula tongue midline. Head turning and shoulder shrug  were normal and symmetric. Motor: The motor testing reveals 5 over 5 strength of all 4 extremities. Good symmetric motor tone is noted throughout.  Sensory: Sensory testing is intact to soft touch on all 4 extremities. No evidence of extinction is noted.  Coordination: Cerebellar testing reveals good finger-nose-finger and heel-to-shin bilaterally.  Gait and station: Gait is slightly wide-based.  Tandem gait not  attempted. Reflexes: Deep tendon reflexes are symmetric and normal bilaterally.   DIAGNOSTIC DATA (LABS, IMAGING, TESTING) - I reviewed patient records, labs, notes, testing and imaging myself where available.  Lab Results  Component Value Date   WBC 7.1 06/06/2014   HGB 11.0 (L) 06/06/2014   HCT 35.2 (L) 06/06/2014   MCV 71.7 (L) 06/06/2014   PLT 225 06/06/2014      Component Value Date/Time   NA 140 02/07/2016 1051   K 3.9 02/07/2016 1051   CL 105 02/07/2016 1051   CO2 20 02/07/2016 1051   GLUCOSE 134 (H) 02/07/2016 1051   BUN 26 (H) 02/07/2016 1051   CREATININE 1.54 (H) 02/07/2016 1051   CALCIUM 9.8 02/07/2016 1051   PROT 7.0 04/23/2009 1930   ALBUMIN 3.2 (L) 04/23/2009 1930   AST 19 04/23/2009 1930   ALT 12 04/23/2009 1930   ALKPHOS 71 04/23/2009 1930   BILITOT 0.6 04/23/2009 1930   GFRNONAA 37 (L) 04/27/2009 0434   GFRAA (L) 04/27/2009 0434    45        The eGFR has been calculated using the MDRD equation. This calculation has not been validated in all clinical situations. eGFR's persistently <60 mL/min signify possible Chronic Kidney Disease.       ASSESSMENT AND PLAN 84 y.o. year old female  has a past medical history of ACL (anterior cruciate ligament) rupture, Acute MI ([place]), Arthritis, Back pain, Benign paroxysmal positional vertigo (03/18/2016), Brain tumor ([place]), Bruit, Chest pain, Chronic anticoagulation, Chronic edema, Chronic kidney disease, Coronary artery disease, Coronary atherosclerosis, Dyslipidemia, GERD (gastroesophageal reflux disease), Hyperlipidemia, Memory difficulty (07/13/2014), Meningioma ([place]), Mild depression ([place]), Ovarian cyst, PAF (paroxysmal atrial fibrillation) ([place]), Paroxysmal atrial flutter ([place]), Seizure ([place]) (2006), Severe obesity ([place]), Sick sinus syndrome ([place]), Sinus node dysfunction ([place]), SOB (shortness of breath) on exertion, Tachycardia-bradycardia syndrome ([place]), Trochlear nerve palsy, and Vertigo. here with :  1.   Seizures 2.  History of meningioma  of brain 3.  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  She will continue on Topamax.  I will repeat a CT of the head without contrast to evaluate for recurrence of meningioma.  She is advised that if her symptoms worsen or she develops new symptoms she should let us know.  We will follow-up in 6 months or sooner if needed.  Ward Givens, MSN, NP-C 02/09/2018, 8:24 AM [facility] [address] [place], [place] [address] [phone]

## 2018-02-15 ENCOUNTER — Ambulatory Visit: Payer: Medicare Other | Admitting: Podiatry

## 2018-02-15 DIAGNOSIS — B351 Tinea unguium: Secondary | ICD-10-CM

## 2018-02-15 DIAGNOSIS — M79604 Pain in right leg: Secondary | ICD-10-CM

## 2018-02-15 DIAGNOSIS — M79605 Pain in left leg: Secondary | ICD-10-CM | POA: Diagnosis not present

## 2018-02-15 DIAGNOSIS — M2042 Other hammer toe(s) (acquired), left foot: Secondary | ICD-10-CM

## 2018-02-16 ENCOUNTER — Other Ambulatory Visit (HOSPITAL_COMMUNITY): Payer: Self-pay | Admitting: Nurse Practitioner

## 2018-02-16 NOTE — Progress Notes (Signed)
Subjective:   Patient ID: [name], female   DOB: 84 y.o.   MRN: [id number]   HPI Patient presents with severe nail disease 1-5 both feet with long-term diabetes digital deformities swelling of her feet with history of diabetic shoes.  Patient has not had them for approximately 4 years   ROS      Objective:  Physical Exam  Diminished neurovascular status noted bilateral with mild edema in the ankle region and foot region bilateral with negative Homans sign noted.  Patient does have digital deformities with structural anomalies of the toes with pre-ulcerative type callus formation is not thick at the current time and has nail disease with thickness 1-5 both feet with incurvation of the beds and pain     Assessment:  Patient with mycotic nail infection bilateral with pain along with structural deformity and long-term diabetes with at risk condition     Plan:  H&P conditions reviewed and due to her foot structure diabetic shoes recommended and we will get approval for this.  Today debridement of nailbeds 1-5 both feet with no iatrogenic bleeding accomplished

## 2018-02-20 ENCOUNTER — Ambulatory Visit
Admission: RE | Admit: 2018-02-20 | Discharge: 2018-02-20 | Disposition: A | Discharge status: Home / Self Care | Payer: Medicare Other | Source: Ambulatory Visit | Attending: Adult Health | Admitting: Adult Health

## 2018-02-20 DIAGNOSIS — Z86011 Personal history of benign neoplasm of the brain: Secondary | ICD-10-CM

## 2018-02-22 ENCOUNTER — Telehealth: Payer: Self-pay | Admitting: *Deleted

## 2018-02-22 NOTE — Telephone Encounter (Signed)
Spoke with patient and informed her that there is no change in her CT head when compared to the previous CT of the head.  She verbalized understanding, appreciation.

## 2018-04-05 ENCOUNTER — Encounter: Payer: Self-pay | Admitting: *Deleted

## 2018-04-08 ENCOUNTER — Ambulatory Visit (INDEPENDENT_AMBULATORY_CARE_PROVIDER_SITE_OTHER): Payer: Medicare Other | Admitting: *Deleted

## 2018-04-08 DIAGNOSIS — I495 Sick sinus syndrome: Secondary | ICD-10-CM | POA: Diagnosis not present

## 2018-04-08 NOTE — Progress Notes (Signed)
Remote pacemaker transmission.   

## 2018-04-09 ENCOUNTER — Encounter: Payer: Self-pay | Admitting: Cardiology

## 2018-04-14 LAB — CUP PACEART REMOTE DEVICE CHECK
Battery Remaining Longevity: 29 mo
Brady Statistic AP VP Percent: 1 %
Brady Statistic AP VS Percent: 89 %
Brady Statistic AS VP Percent: 1 %
Brady Statistic AS VS Percent: 9.1 %
Brady Statistic RV Percent Paced: 2.4 %
Implantable Lead Implant Date: 20100507
Implantable Lead Location: 753859
Lead Channel Impedance Value: 300 Ohm
Lead Channel Pacing Threshold Amplitude: 1 V
Lead Channel Pacing Threshold Pulse Width: 0.4 ms
Lead Channel Sensing Intrinsic Amplitude: 12 mV
Lead Channel Setting Pacing Amplitude: 1.25 V
Lead Channel Setting Pacing Amplitude: 1.625
Lead Channel Setting Pacing Pulse Width: 0.4 ms
MDC IDC LEAD IMPLANT DT: 20100507
MDC IDC LEAD LOCATION: 753860
MDC IDC MSMT BATTERY REMAINING PERCENTAGE: 25 %
MDC IDC MSMT BATTERY VOLTAGE: 2.8 V
MDC IDC MSMT LEADCHNL RA PACING THRESHOLD AMPLITUDE: 0.625 V
MDC IDC MSMT LEADCHNL RA PACING THRESHOLD PULSEWIDTH: 0.4 ms
MDC IDC MSMT LEADCHNL RA SENSING INTR AMPL: 5 mV
MDC IDC MSMT LEADCHNL RV IMPEDANCE VALUE: 560 Ohm
MDC IDC PG IMPLANT DT: 20100507
MDC IDC SESS DTM: 20190418062120
MDC IDC SET LEADCHNL RV SENSING SENSITIVITY: 2 mV
MDC IDC STAT BRADY RA PERCENT PACED: 76 %
Pulse Gen Model: 2210
Pulse Gen Serial Number: 2261146

## 2018-04-21 ENCOUNTER — Encounter: Payer: Medicare Other | Admitting: Cardiovascular Disease

## 2018-04-30 ENCOUNTER — Ambulatory Visit: Payer: Medicare Other | Admitting: Podiatry

## 2018-04-30 ENCOUNTER — Encounter: Payer: Self-pay | Admitting: Podiatry

## 2018-04-30 DIAGNOSIS — M79674 Pain in right toe(s): Secondary | ICD-10-CM

## 2018-04-30 DIAGNOSIS — M79675 Pain in left toe(s): Secondary | ICD-10-CM | POA: Diagnosis not present

## 2018-04-30 DIAGNOSIS — B351 Tinea unguium: Secondary | ICD-10-CM

## 2018-05-03 NOTE — Progress Notes (Signed)
Subjective:   Patient ID: [name], female   DOB: 84 y.o.   MRN: [id number]   HPI Patient presents with thick yellow brittle nailbeds 1-5 both feet that she cannot cut and are difficult for her to take care of and painful   ROS      Objective:  Physical Exam  Neurovascular status intact with thick yellow brittle nailbeds 1-5 both feet that are painful in the corners and hard to wear shoe gear with     Assessment:  Mycotic nail infection with pain 1-5 both feet     Plan:  Debride painful nailbeds 1-5 both feet with no iatrogenic bleeding noted

## 2018-05-20 ENCOUNTER — Encounter: Payer: Self-pay | Admitting: Cardiovascular Disease

## 2018-05-20 ENCOUNTER — Ambulatory Visit: Payer: Medicare Other | Admitting: Cardiovascular Disease

## 2018-05-20 VITALS — BP 138/82 | HR 60 | Ht 65.0 in | Wt 227.0 lb

## 2018-05-20 DIAGNOSIS — Z6837 Body mass index (BMI) 37.0-37.9, adult: Secondary | ICD-10-CM | POA: Diagnosis not present

## 2018-05-20 DIAGNOSIS — I10 Essential (primary) hypertension: Secondary | ICD-10-CM

## 2018-05-20 DIAGNOSIS — Z7901 Long term (current) use of anticoagulants: Secondary | ICD-10-CM

## 2018-05-20 DIAGNOSIS — Z950 Presence of cardiac pacemaker: Secondary | ICD-10-CM | POA: Diagnosis not present

## 2018-05-20 DIAGNOSIS — I480 Paroxysmal atrial fibrillation: Secondary | ICD-10-CM

## 2018-05-20 DIAGNOSIS — I495 Sick sinus syndrome: Secondary | ICD-10-CM | POA: Diagnosis not present

## 2018-05-20 DIAGNOSIS — I2510 Atherosclerotic heart disease of native coronary artery without angina pectoris: Secondary | ICD-10-CM

## 2018-05-20 DIAGNOSIS — M1A0790 Idiopathic chronic gout, unspecified ankle and foot, without tophus (tophi): Secondary | ICD-10-CM | POA: Diagnosis not present

## 2018-05-20 MED ORDER — NITROGLYCERIN 0.4 MG/SPRAY TL SOLN: 3.0000 | 3 refills | Status: AC | PRN

## 2018-05-20 MED ORDER — VERAPAMIL HCL ER 360 MG PO CP24: 360.0000 mg | ORAL_CAPSULE | Freq: Every day | ORAL | 3 refills | Status: DC

## 2018-05-20 NOTE — Progress Notes (Signed)
Cardiology Office Note    Date:  05/22/2018   ID:  [name], DOB 31-Oct-1932, MRN [id number]  PCP:  Willey Blade, MD  Cardiologist:   Sanda Klein, MD   Chief Complaint  Patient presents with  . Pacemaker Check    History of Present Illness:  [name] is a 84 y.o. female here for follow-up of her dual-chamber permanent pacemaker. The device was implanted in 2010 for tachycardia-bradycardia syndrome (paroxysmal atrial tachycardia, paroxysmal atrial fibrillation, symptomatic sinus bradycardia).   She has done well recently without any new respiratory problems and without episodes of gout.  She is unaware of any palpitations although her pacemaker has recorded both atrial flutter and atrial fibrillation.  She has occasional days with recurrent, but I have not been able to clearly show a correlation with her episodes of arrhythmia.  She has some issues with her ankle swelling, usually worse in the right leg, but no worse than usual.  Pacemaker interrogation shows that her [place] Accent dual-chamber device has roughly 2.1 estimated years of generator longevity.  Lead parameters are good.  There is 76% atrial pacing and only 2.4% ventricular pacing.  The overall burden of atrial fibrillation has diminished somewhat, now down to about 14%.  While in atrial fibrillation the rate is well controlled but she has occasional episodes of atrial flutter with 2: 1 AV conduction and a ventricular rate of 150 bpm.  For the most part the episodes of arrhythmia only last for up to 4 hours.  Presenting rhythm today is atrial paced ventricular sensed.  She has never had embolic events that we are aware of. She does have a history of seizures associated with resection of multiple meningiomas in the past.  She has no evidence of structural heart disease by previous echo and nuclear stress testing, had minimal CAD on angiography 2011. She has preserved left ventricular systolic  function. There are subtle signs of diastolic function abnormality, but no clinical heart failure.   Past Medical History:  Diagnosis Date  . ACL (anterior cruciate ligament) rupture    Left  . Acute MI ([place])   . Arthritis   . Back pain    Secondary to arthritis  . Benign paroxysmal positional vertigo 03/18/2016  . Brain tumor ([place])    On right side. Removed in 1989  . Bruit    Duplex doppler 03/28/05 Mildly abnormal. *Right subclavian artery: Less than 50% diameter reduction.  . Chest pain    2D Echo 04/21/08 EF = >55%. Moderate tricuspid regurgitation. Persantine Myovoew stress test 04/21/08 EF = 81%, no evidence of ischemia noted.  . Chronic anticoagulation    PAF  . Chronic edema    Mild LE edema  . Chronic kidney disease    Mild, stage 2  . Coronary artery disease   . Coronary atherosclerosis    Minimal  . Dyslipidemia   . GERD (gastroesophageal reflux disease)   . Hyperlipidemia    On pravastatin  . Memory difficulty 07/13/2014  . Meningioma ([place])    Resection in 1998  . Mild depression ([place])   . Ovarian cyst    Benign. Had a salpiingoophorectomy in 2005.  [name] PAF (paroxysmal atrial fibrillation) (H[facility])    On coumadin. St. Jude PPM (serial [id number]) inserted 2010  . Paroxysmal atrial flutter ([place])   . Seizure ([place]) 2006  . Severe obesity ([place])   . Sick sinus syndrome ([place])   . Sinus node dysfunction ([facility])    S/P  implantation of a dual-chamber permanent pacemaker in 2010  . SOB (shortness of breath) on exertion    Class II. Catheterization 04/30/10 showed mild CAD with mildly elevated right heart pressures.   . Tachycardia-bradycardia syndrome ([place])    S/P implantation of dual-chamber permanent pacemaker in 2010  . Trochlear nerve palsy    Right  . Vertigo     Past Surgical History:  Procedure Laterality Date  . Brain tumor removal  1989  . CARDIAC CATHETERIZATION  04/30/10   Showed mild CAD with mildly elevated right heart pressures. (Dr. Elisabeth Cara)  . CRANIOTOMY   1989   (Brain tumor) With gamma knife procedure as well.  [name] KNEE SURGERY     Bilateral, arthroscopic  . PACEMAKER INSERTION  04/27/09   Implanted by Dr. Elisabeth Cara. St. Jude Accent DR (serial [id number])    Current Medications: Outpatient Medications Prior to Visit  Medication Sig Dispense Refill  . allopurinol (ZYLOPRIM) 100 MG tablet Take 100 mg by mouth daily.    . Cholecalciferol (VITAMIN D3) 1000 units CAPS Take 1 capsule by mouth daily.    . clotrimazole-betamethasone (LOTRISONE) cream clotrimazole-betamethasone 1 %-0.05 % topical cream    . Coenzyme Q10 (CO Q 10 PO) Take by mouth.    . colchicine 0.6 MG tablet Take 0.6 mg by mouth. Reported on 03/18/2016    . ferrous sulfate 325 (65 FE) MG tablet TAKE 1 TABLET (325 MG TOTAL) BY MOUTH EVERY MONDAY, WEDNESDAY, AND FRIDAY. 30 tablet 7  . hydrochlorothiazide (HYDRODIURIL) 12.5 MG tablet TAKE 1 TABLET (12.5 MG TOTAL) BY MOUTH DAILY. 30 tablet 8  . ketorolac (TORADOL) 60 MG/2ML SOLN injection ketorolac 60 mg/2 mL intramuscular solution  Inject 60 mg    . KLOR-CON 10 10 MEQ tablet Take 10 mEq by mouth 2 (two) times daily.  5  . Multiple Vitamins-Minerals (MULTIVITAMIN PO) Take by mouth daily.    . NEOMYCIN-BACITRACIN-POLYMYXIN EX neomycin 3.5 mg/g-polymyxin B 10,000 unit/g-dexameth 0.1 % eye oint    . neomycin-polymyxin b-dexamethasone (MAXITROL) 3.5-10000-0.1 OINT APPLY 1 A SMALL AMOUNT INTO LEFT EYE AT BEDTIME  0  . omeprazole (PRILOSEC) 20 MG capsule Take 20 mg by mouth daily.    . pravastatin (PRAVACHOL) 20 MG tablet Take 20 mg by mouth at bedtime.    . topiramate (TOPAMAX) 50 MG tablet Take 50 mg by mouth 2 (two) times daily.    . traMADol (ULTRAM) 50 MG tablet Take 1 tablet (50 mg total) by mouth every 6 (six) hours as needed for moderate pain. 30 tablet 0  . triamcinolone acetonide (KENALOG-40) 40 MG/ML injection Kenalog 40 mg/mL suspension for injection  Inject 60 mg x 1    . warfarin (COUMADIN) 4 MG tablet Take 4 mg by mouth daily.      . nitroGLYCERIN (NITROLINGUAL) 0.4 MG/SPRAY spray Place 3 sprays under the tongue every 5 (five) minutes x 3 doses as needed for chest pain.    . verapamil (CALAN-SR) 120 MG CR tablet TAKE 1 TABLET (120 MG TOTAL) BY MOUTH 2 (TWO) TIMES DAILY. 60 tablet 6  . Influenza vac split quadrivalent PF (FLUZONE HIGH-DOSE) 0.5 ML injection Fluzone High-Dose 2018-2019 (PF) 180 mcg/0.5 mL intramuscular syringe    . verapamil (CALAN-SR) 120 MG CR tablet Take 1 tablet (120 mg total) 2 (two) times daily by mouth. 180 tablet 3   No facility-administered medications prior to visit.      Allergies:   Dilantin [phenytoin sodium extended] and Phenobarbital   Social History   Socioeconomic History  .  Marital status: Divorced    Spouse name: Not on file  . Number of children: 3  . Years of education: Not on file  . Highest education level: Not on file  Occupational History    Comment: Retired  Scientific laboratory technician  . Financial resource strain: Not on file  . Food insecurity:    Worry: Not on file    Inability: Not on file  . Transportation needs:    Medical: Not on file    Non-medical: Not on file  Tobacco Use  . Smoking status: Former Smoker    Years: 20.00    Types: Cigarettes    Last attempt to quit: 12/23/2007    Years since quitting: 10.4  . Smokeless tobacco: Never Used  Substance and Sexual Activity  . Alcohol use: No    Alcohol/week: 0.0 oz  . Drug use: No  . Sexual activity: Not on file  Lifestyle  . Physical activity:    Days per week: Not on file    Minutes per session: Not on file  . Stress: Not on file  Relationships  . Social connections:    Talks on phone: Not on file    Gets together: Not on file    Attends religious service: Not on file    Active member of club or organization: Not on file    Attends meetings of clubs or organizations: Not on file    Relationship status: Not on file  Other Topics Concern  . Not on file  Social History Narrative  . Not on file     Family  History:  The patient's family history includes Heart disease in her brother, brother, brother, father, sister, sister, and sister; Stroke in her mother.   ROS:   Please see the history of present illness.    ROS All other systems reviewed and are negative.   PHYSICAL EXAM:   VS:  BP 138/82   Pulse 60   Ht 5\' 5"  (1.651 m)   Wt 227 lb (103 kg)   SpO2 97%   BMI 37.77 kg/m     General: Alert, oriented x3, no distress, moderate to severely obese; healthy left subclavian pacemaker site Head: no evidence of trauma, PERRL, EOMI, no exophtalmos or lid lag, no myxedema, no xanthelasma; normal ears, nose and oropharynx Neck: normal jugular venous pulsations and no hepatojugular reflux; brisk carotid pulses without delay and no carotid bruits Chest: clear to auscultation, no signs of consolidation by percussion or palpation, normal fremitus, symmetrical and full respiratory excursions Cardiovascular: normal position and quality of the apical impulse, regular rhythm, normal first and second heart sounds, no murmurs, rubs or gallops Abdomen: no tenderness or distention, no masses by palpation, no abnormal pulsatility or arterial bruits, normal bowel sounds, no hepatosplenomegaly Extremities: no clubbing, cyanosis or edema; 2+ radial, ulnar and brachial pulses bilaterally; 2+ right femoral, posterior tibial and dorsalis pedis pulses; 2+ left femoral, posterior tibial and dorsalis pedis pulses; no subclavian or femoral bruits Neurological: grossly nonfocal Psych: Normal mood and affect  Wt Readings from Last 3 Encounters:  05/20/18 227 lb (103 kg)  02/09/18 225 lb 6.4 oz (102.2 kg)  10/30/17 221 lb (100.2 kg)      Studies/Labs Reviewed:   EKG:  EKG is ordered today.  The ekg ordered today demonstrates atrial paced ventricular sensed rhythm with prolonged AV delay 260 ms, otherwise normal tracing.  QTc normal at 412 ms  Recent Labs: No results found for requested labs within last 8760  hours.     ASSESSMENT:    1. Paroxysmal atrial fibrillation (HCC)   2. Long term (current) use of anticoagulants   3. SSS (sick sinus syndrome) ([place])   4. Pacemaker   5. Essential hypertension   6. Coronary artery disease involving native coronary artery of native heart without angina pectoris   7. Class 2 severe obesity due to excess calories with serious comorbidity and body mass index (BMI) of 37.0 to 37.9 in adult ([place])   8. Idiopathic chronic gout of foot without tophus, unspecified laterality      PLAN:  In order of problems listed above:  1. Paroxysmal atrial fibrillation: Overall burden of atrial fibrillation seems to be gradually returning to her baseline, after an exacerbation during respiratory infection.  During episodes of more coordinated atrial flutter like rhythm she has rapid ventricular rates.  We will increase her dose of verapamil to 360 mg daily. CHADSVasc 4 (age 34, HTN, gender). 2. Anticoagulation with warfarin, monitored by PCP.  No bleeding complications. 3. Sinus node dysfunction when she is not in atrial fibrillation she mostly has atrial paced rhythm, with adequate heart rate histogram distribution. 4. PPM: Normal device function, remote downloads every 3 months and yearly office visits. 5. Essential hypertension above target range of 130/80, should improve with the higher dose of verapamil.  Asked [name] to call if she develops edema or constipation. 6. CAD: Minor by previous angiography, asymptomatic  7. Obesity: As before, she is very sedentary and has been exercising even less in the last few weeks.  This is a constant point of contention between her and her daughter.  Encouraged her to try to get on her stationary bicycle every day. 8. Gout: If she has gout exacerbations requiring colchicine, need to monitor for signs of colchicine toxicity due to the interaction with verapamil.    Medication Adjustments/Labs and Tests Ordered: Current medicines are reviewed at  length with the patient today.  Concerns regarding medicines are outlined above.  Medication changes, Labs and Tests ordered today are listed in the Patient Instructions below. Patient Instructions  Dr Sallyanne Kuster has recommended making the following medication changes: 1. INCREASE Verapamil to 360 mg ONCE DAILY  Remote monitoring is used to monitor your Pacemaker or ICD from home. This monitoring reduces the number of office visits required to check your device to one time per year. It allows [name] to keep an eye on the functioning of your device to ensure it is working properly. You are scheduled for a device check from home on Thursday, July 18th, 2019. You may send your transmission at any time that day. If you have a wireless device, the transmission will be sent automatically. After your physician reviews your transmission, you will receive a notification with your next transmission date.  To improve our patient care and to more adequately follow your device, [facility] has decided, as a practice, to start following each patient four times a year with your home monitor. This means that you may experience a remote appointment that is close to an in-office appointment with your physician. Your insurance will apply at the same rate as other remote monitoring transmissions.  Dr Sallyanne Kuster recommends that you schedule a follow-up appointment in 6 months with a pacemaker check. You will receive a reminder letter in the mail two months in advance. If you don't receive a letter, please call our office to schedule the follow-up appointment.  If you need a refill on your cardiac medications before your  next appointment, please call your pharmacy.    Signed, Sanda Klein, MD  05/22/2018 12:09 PM    [address], [place], [place]  [address] Phone: [phone]; Fax: [phone]: There is

## 2018-05-20 NOTE — Patient Instructions (Addendum)
Dr Sallyanne Kuster has recommended making the following medication changes: 1. INCREASE Verapamil to 360 mg ONCE DAILY  Remote monitoring is used to monitor your Pacemaker or ICD from home. This monitoring reduces the number of office visits required to check your device to one time per year. It allows [name] to keep an eye on the functioning of your device to ensure it is working properly. You are scheduled for a device check from home on Thursday, July 18th, 2019. You may send your transmission at any time that day. If you have a wireless device, the transmission will be sent automatically. After your physician reviews your transmission, you will receive a notification with your next transmission date.  To improve our patient care and to more adequately follow your device, [facility] has decided, as a practice, to start following each patient four times a year with your home monitor. This means that you may experience a remote appointment that is close to an in-office appointment with your physician. Your insurance will apply at the same rate as other remote monitoring transmissions.  Dr Sallyanne Kuster recommends that you schedule a follow-up appointment in 6 months with a pacemaker check. You will receive a reminder letter in the mail two months in advance. If you don't receive a letter, please call our office to schedule the follow-up appointment.  If you need a refill on your cardiac medications before your next appointment, please call your pharmacy.

## 2018-05-22 ENCOUNTER — Encounter: Payer: Self-pay | Admitting: Cardiovascular Disease

## 2018-05-31 ENCOUNTER — Telehealth: Payer: Self-pay | Admitting: Cardiovascular Disease

## 2018-05-31 MED ORDER — METOPROLOL SUCCINATE ER 25 MG PO TB24: 25.0000 mg | ORAL_TABLET | Freq: Every day | ORAL | 1 refills | Status: DC

## 2018-05-31 MED ORDER — VERAPAMIL HCL ER 240 MG PO CP24: 240.0000 mg | ORAL_CAPSULE | Freq: Every day | ORAL | 1 refills | Status: DC

## 2018-05-31 NOTE — Telephone Encounter (Signed)
Patient made aware of medication changes and verbalized her understanding. Verapamil 240 mg daily and Metoprolol Succinate 25 mg daily have been sent into the pharmacy.  She will call back if anything further is needed.

## 2018-05-31 NOTE — Telephone Encounter (Signed)
Left a message to call back.

## 2018-05-31 NOTE — Telephone Encounter (Signed)
Follow up  ° ° °Patient is returning call.  °

## 2018-05-31 NOTE — Telephone Encounter (Signed)
New message        Pt c/o medication issue:  1. Name of Medication: verapamil (VERELAN PM) 360 MG 24 hr capsule  2. How are you currently taking this medication (dosage and times per day)? Take 1 capsule (360 mg total) by mouth at bedtime. 3. Are you having a reaction (difficulty breathing--STAT)? no  4. What is your medication issue? Patient  calling with concerns of severe constipation

## 2018-05-31 NOTE — Telephone Encounter (Signed)
Please go back to her previous verapamil dose of 240 mg daily and add metoprolol succinate 25 mg daily. MCr

## 2018-05-31 NOTE — Telephone Encounter (Signed)
Call placed to the patient. She stated that since the increase in the verapamil to 360 mg that she has been having problems with constipation and now her abdomen is bloated.  She stated that she drinks 3-5 glasses of water a day. She started taking ducolax yesterday which helped somewhat today. She stated that when she strains it makes her dizzy and she is afraid she might pass out. She has been advised to not strain anymore and that she could also try warm prune juice.   Message routed to the provider and pharmd for further recommendations.

## 2018-06-23 ENCOUNTER — Other Ambulatory Visit: Payer: Self-pay | Admitting: Cardiovascular Disease

## 2018-06-25 LAB — CUP PACEART INCLINIC DEVICE CHECK
Implantable Lead Implant Date: 20100507
Implantable Lead Location: 753859
Implantable Lead Location: 753860
Implantable Pulse Generator Implant Date: 20100507
Lead Channel Setting Pacing Amplitude: 1.25 V
Lead Channel Setting Sensing Sensitivity: 2 mV
MDC IDC LEAD IMPLANT DT: 20100507
MDC IDC SESS DTM: 20190705161331
MDC IDC SET LEADCHNL RA PACING AMPLITUDE: 1.625
MDC IDC SET LEADCHNL RV PACING PULSEWIDTH: 0.4 ms
Pulse Gen Model: 2210
Pulse Gen Serial Number: 2261146

## 2018-07-02 ENCOUNTER — Ambulatory Visit: Payer: Medicare Other | Admitting: Podiatry

## 2018-07-08 ENCOUNTER — Ambulatory Visit (INDEPENDENT_AMBULATORY_CARE_PROVIDER_SITE_OTHER): Payer: Medicare Other | Admitting: *Deleted

## 2018-07-08 DIAGNOSIS — I495 Sick sinus syndrome: Secondary | ICD-10-CM | POA: Diagnosis not present

## 2018-07-08 NOTE — Progress Notes (Signed)
Remote pacemaker transmission.   

## 2018-07-09 ENCOUNTER — Encounter: Payer: Self-pay | Admitting: Cardiology

## 2018-07-12 ENCOUNTER — Ambulatory Visit: Payer: Medicare Other | Admitting: Podiatry

## 2018-07-12 ENCOUNTER — Encounter: Payer: Self-pay | Admitting: Podiatry

## 2018-07-12 DIAGNOSIS — B351 Tinea unguium: Secondary | ICD-10-CM | POA: Diagnosis not present

## 2018-07-12 DIAGNOSIS — K219 Gastro-esophageal reflux disease without esophagitis: Secondary | ICD-10-CM | POA: Insufficient documentation

## 2018-07-12 DIAGNOSIS — E785 Hyperlipidemia, unspecified: Secondary | ICD-10-CM | POA: Insufficient documentation

## 2018-07-12 DIAGNOSIS — M79675 Pain in left toe(s): Secondary | ICD-10-CM | POA: Diagnosis not present

## 2018-07-12 DIAGNOSIS — Z9229 Personal history of other drug therapy: Secondary | ICD-10-CM | POA: Insufficient documentation

## 2018-07-12 DIAGNOSIS — M79674 Pain in right toe(s): Secondary | ICD-10-CM | POA: Diagnosis not present

## 2018-07-12 DIAGNOSIS — Z86011 Personal history of benign neoplasm of the brain: Secondary | ICD-10-CM | POA: Insufficient documentation

## 2018-07-12 DIAGNOSIS — Z8669 Personal history of other diseases of the nervous system and sense organs: Secondary | ICD-10-CM | POA: Insufficient documentation

## 2018-07-12 DIAGNOSIS — R7989 Other specified abnormal findings of blood chemistry: Secondary | ICD-10-CM | POA: Insufficient documentation

## 2018-07-14 NOTE — Progress Notes (Signed)
Subjective:   Patient ID: [name], female   DOB: 84 y.o.   MRN: [id number]   HPI Patient presents with thick yellow nailbeds 1-5 both feet that are painful when palpated and makes wearing shoe gear difficult   ROS      Objective:  Physical Exam  Neurovascular status intact with thick yellow brittle nailbeds 1-5 both feet that are painful     Assessment:  Mycotic nail infection with pain 1-5 both feet     Plan:  Debride painful nailbeds 1-5 both feet with no iatrogenic bleeding noted

## 2018-07-20 LAB — CUP PACEART REMOTE DEVICE CHECK
Brady Statistic AP VP Percent: 1 %
Brady Statistic AP VS Percent: 95 %
Brady Statistic AS VP Percent: 1 %
Brady Statistic AS VS Percent: 4.8 %
Brady Statistic RV Percent Paced: 2.5 %
Date Time Interrogation Session: 20190718064954
Implantable Lead Implant Date: 20100507
Implantable Lead Implant Date: 20100507
Implantable Lead Location: 753859
Implantable Lead Location: 753860
Lead Channel Impedance Value: 560 Ohm
Lead Channel Pacing Threshold Amplitude: 1.25 V
Lead Channel Pacing Threshold Pulse Width: 0.4 ms
Lead Channel Sensing Intrinsic Amplitude: 12 mV
Lead Channel Setting Pacing Amplitude: 1.5 V
Lead Channel Setting Pacing Amplitude: 1.625
Lead Channel Setting Pacing Pulse Width: 0.4 ms
Lead Channel Setting Sensing Sensitivity: 2 mV
MDC IDC MSMT BATTERY REMAINING LONGEVITY: 23 mo
MDC IDC MSMT BATTERY REMAINING PERCENTAGE: 19 %
MDC IDC MSMT BATTERY VOLTAGE: 2.77 V
MDC IDC MSMT LEADCHNL RA IMPEDANCE VALUE: 310 Ohm
MDC IDC MSMT LEADCHNL RA PACING THRESHOLD AMPLITUDE: 0.625 V
MDC IDC MSMT LEADCHNL RA PACING THRESHOLD PULSEWIDTH: 0.4 ms
MDC IDC MSMT LEADCHNL RA SENSING INTR AMPL: 5 mV
MDC IDC PG IMPLANT DT: 20100507
MDC IDC STAT BRADY RA PERCENT PACED: 87 %
Pulse Gen Model: 2210
Pulse Gen Serial Number: 2261146

## 2018-07-25 ENCOUNTER — Other Ambulatory Visit: Payer: Self-pay | Admitting: Cardiovascular Disease

## 2018-10-07 ENCOUNTER — Ambulatory Visit (INDEPENDENT_AMBULATORY_CARE_PROVIDER_SITE_OTHER): Payer: Medicare Other | Admitting: *Deleted

## 2018-10-07 DIAGNOSIS — I495 Sick sinus syndrome: Secondary | ICD-10-CM | POA: Diagnosis not present

## 2018-10-07 DIAGNOSIS — I10 Essential (primary) hypertension: Secondary | ICD-10-CM

## 2018-10-07 NOTE — Progress Notes (Signed)
Remote pacemaker transmission.   

## 2018-10-11 ENCOUNTER — Ambulatory Visit: Payer: Medicare Other | Admitting: Podiatry

## 2018-10-11 ENCOUNTER — Other Ambulatory Visit: Payer: Self-pay

## 2018-10-11 DIAGNOSIS — M79675 Pain in left toe(s): Secondary | ICD-10-CM | POA: Diagnosis not present

## 2018-10-11 DIAGNOSIS — M79674 Pain in right toe(s): Secondary | ICD-10-CM

## 2018-10-11 DIAGNOSIS — B351 Tinea unguium: Secondary | ICD-10-CM

## 2018-10-11 DIAGNOSIS — Z9229 Personal history of other drug therapy: Secondary | ICD-10-CM

## 2018-10-19 ENCOUNTER — Encounter: Payer: Self-pay | Admitting: Podiatry

## 2018-10-19 NOTE — Progress Notes (Signed)
Subjective: [name] is a 84 y.o. y.o. female who presents today for follow up of painful mycotic toenails b/l feet which interfere with daily activities. Pain is relieved with periodic professional debridement.  She is on a long term blood thinner.  She voices no new pedal concerns on today's visit.  Risk factors: CAD, MI, dyslipidemia,  obesity, former smoker, strong FH of heart disease  Objective: Vascular Examination: Capillary refill time <3 seconds x 10 digits Dorsalis pedis pulses diminished b/l Posterior tibial pulses diminished b/l No digital hair x 10 digits Skin temperature gradient WNL b/l  Dermatological Examination: Skin slightly atrophic b/l No digital hair  x 10 digits Toenails 1-5 b/l discolored, thick, dystrophic with subungual debris and pain with palpation to nailbeds due to thickness of nails.  Musculoskeletal: Muscle strength 5/5 to all LE muscle groups Hammertoe deformity left 2nd and 3rd digits  Neurological: Sensation intact with 10 gram monofilament. Vibratory sensation intact.  Assessment: Painful onychomycosis toenails 1-5 b/l in patient on blood thinner.   Plan: 1. Toenails 1-5 b/l were debrided in length and girth without iatrogenic bleeding. 2. Patient to continue soft, supportive shoe gear 3. Patient to report any pedal injuries to medical professional immediately. 4. Avoid self trimming due to use of blood thinner. 5. Follow up 3 months. Patient/POA to call should there be a concern in the interim.

## 2018-10-27 ENCOUNTER — Other Ambulatory Visit: Payer: Self-pay | Admitting: Cardiovascular Disease

## 2018-11-17 ENCOUNTER — Encounter: Payer: Self-pay | Admitting: Cardiovascular Disease

## 2018-11-17 ENCOUNTER — Ambulatory Visit (INDEPENDENT_AMBULATORY_CARE_PROVIDER_SITE_OTHER): Payer: Medicare Other | Admitting: Cardiovascular Disease

## 2018-11-17 VITALS — BP 114/66 | HR 61 | Ht 64.96 in | Wt 233.4 lb

## 2018-11-17 DIAGNOSIS — I10 Essential (primary) hypertension: Secondary | ICD-10-CM

## 2018-11-17 DIAGNOSIS — I5032 Chronic diastolic (congestive) heart failure: Secondary | ICD-10-CM | POA: Insufficient documentation

## 2018-11-17 DIAGNOSIS — E66812 Obesity, class 2: Secondary | ICD-10-CM

## 2018-11-17 DIAGNOSIS — Z7901 Long term (current) use of anticoagulants: Secondary | ICD-10-CM

## 2018-11-17 DIAGNOSIS — Z950 Presence of cardiac pacemaker: Secondary | ICD-10-CM | POA: Diagnosis not present

## 2018-11-17 DIAGNOSIS — I480 Paroxysmal atrial fibrillation: Secondary | ICD-10-CM

## 2018-11-17 DIAGNOSIS — N183 Chronic kidney disease, stage 3 unspecified: Secondary | ICD-10-CM

## 2018-11-17 DIAGNOSIS — I5033 Acute on chronic diastolic (congestive) heart failure: Secondary | ICD-10-CM

## 2018-11-17 DIAGNOSIS — E790 Hyperuricemia without signs of inflammatory arthritis and tophaceous disease: Secondary | ICD-10-CM

## 2018-11-17 DIAGNOSIS — Z6838 Body mass index (BMI) 38.0-38.9, adult: Secondary | ICD-10-CM

## 2018-11-17 DIAGNOSIS — I495 Sick sinus syndrome: Secondary | ICD-10-CM | POA: Diagnosis not present

## 2018-11-17 DIAGNOSIS — I2510 Atherosclerotic heart disease of native coronary artery without angina pectoris: Secondary | ICD-10-CM

## 2018-11-17 MED ORDER — POTASSIUM CHLORIDE CRYS ER 20 MEQ PO TBCR: 20.0000 meq | EXTENDED_RELEASE_TABLET | Freq: Every day | ORAL | 3 refills | Status: DC

## 2018-11-17 MED ORDER — FUROSEMIDE 40 MG PO TABS: 40.0000 mg | ORAL_TABLET | Freq: Every day | ORAL | 3 refills | Status: DC

## 2018-11-17 MED ORDER — KLOR-CON 10 10 MEQ PO TBCR: 10.0000 meq | EXTENDED_RELEASE_TABLET | Freq: Two times a day (BID) | ORAL | 3 refills | Status: DC

## 2018-11-17 NOTE — Patient Instructions (Addendum)
Medication Instructions:  Dr Sallyanne Kuster has recommended making the following medication changes: 1. START Furosemide 40 mg  2. TAKE ADDITIONAL Potassium 20 mEq on days you take Furosemide  ** Weigh daily ** TAKE Furosemide and an extra Potassium 20 mEq daily until your weight comes down to 225 pounds. ** Then take Furosemide AS NEEDED for weight over 225 pounds.  If you need a refill on your cardiac medications before your next appointment, please call your pharmacy.   Lab work: Your physician recommends that you return for lab work in 1-2 weeks.  If you have labs (blood work) drawn today and your tests are completely normal, you will receive your results only by: [name] MyChart Message (if you have MyChart) OR . A paper copy in the mail If you have any lab test that is abnormal or we need to change your treatment, we will call you to review the results.  Testing/Procedures: Your physician has requested that you have an echocardiogram. Echocardiography is a painless test that uses sound waves to create images of your heart. It provides your doctor with information about the size and shape of your heart and how well your heart's chambers and valves are working. This procedure takes approximately one hour. There are no restrictions for this procedure.  >> This will be performed at our [facility] location [address] [place], [place] [address] [phone]  Follow-Up:  Your physician recommends that you schedule a follow-up appointment in 2-3 weeks with a NP/PA.  Dr Sallyanne Kuster recommends that you schedule a follow-up appointment in 3 months.    Low-Sodium Eating Plan Sodium, which is an element that makes up salt, helps you maintain a healthy balance of fluids in your body. Too much sodium can increase your blood pressure and cause fluid and waste to be held in your body. Your health care provider or dietitian may recommend following this plan if you have high blood pressure  (hypertension), kidney disease, liver disease, or heart failure. Eating less sodium can help lower your blood pressure, reduce swelling, and protect your heart, liver, and kidneys. What are tips for following this plan? General guidelines  Most people on this plan should limit their sodium intake to 1,500-2,000 mg (milligrams) of sodium each day. Reading food labels  The Nutrition Facts label lists the amount of sodium in one serving of the food. If you eat more than one serving, you must multiply the listed amount of sodium by the number of servings.  Choose foods with less than 140 mg of sodium per serving.  Avoid foods with 300 mg of sodium or more per serving. Shopping  Look for lower-sodium products, often labeled as "low-sodium" or "no salt added."  Always check the sodium content even if foods are labeled as "unsalted" or "no salt added".  Buy fresh foods. ? Avoid canned foods and premade or frozen meals. ? Avoid canned, cured, or processed meats  Buy breads that have less than 80 mg of sodium per slice. Cooking  Eat more home-cooked food and less restaurant, buffet, and fast food.  Avoid adding salt when cooking. Use salt-free seasonings or herbs instead of table salt or sea salt. Check with your health care provider or pharmacist before using salt substitutes.  Cook with plant-based oils, such as canola, sunflower, or olive oil. Meal planning  When eating at a restaurant, ask that your food be prepared with less salt or no salt, if possible.  Avoid foods that contain MSG (monosodium  glutamate). MSG is sometimes added to [place] food, bouillon, and some canned foods. What foods are recommended? The items listed may not be a complete list. Talk with your dietitian about what dietary choices are best for you. Grains Low-sodium cereals, including oats, puffed wheat and rice, and shredded wheat. Low-sodium crackers. Unsalted rice. Unsalted pasta. Low-sodium bread.  Whole-grain breads and whole-grain pasta. Vegetables Fresh or frozen vegetables. "No salt added" canned vegetables. "No salt added" tomato sauce and paste. Low-sodium or reduced-sodium tomato and vegetable juice. Fruits Fresh, frozen, or canned fruit. Fruit juice. Meats and other protein foods Fresh or frozen (no salt added) meat, poultry, seafood, and fish. Low-sodium canned tuna and salmon. Unsalted nuts. Dried peas, beans, and lentils without added salt. Unsalted canned beans. Eggs. Unsalted nut butters. Dairy Milk. Soy milk. Cheese that is naturally low in sodium, such as ricotta cheese, fresh mozzarella, or Swiss cheese Low-sodium or reduced-sodium cheese. Cream cheese. Yogurt. Fats and oils Unsalted butter. Unsalted margarine with no trans fat. Vegetable oils such as canola or olive oils. Seasonings and other foods Fresh and dried herbs and spices. Salt-free seasonings. Low-sodium mustard and ketchup. Sodium-free salad dressing. Sodium-free light mayonnaise. Fresh or refrigerated horseradish. Lemon juice. Vinegar. Homemade, reduced-sodium, or low-sodium soups. Unsalted popcorn and pretzels. Low-salt or salt-free chips. What foods are not recommended? The items listed may not be a complete list. Talk with your dietitian about what dietary choices are best for you. Grains Instant hot cereals. Bread stuffing, pancake, and biscuit mixes. Croutons. Seasoned rice or pasta mixes. Noodle soup cups. Boxed or frozen macaroni and cheese. Regular salted crackers. Self-rising flour. Vegetables Sauerkraut, pickled vegetables, and relishes. Olives. [place] fries. Onion rings. Regular canned vegetables (not low-sodium or reduced-sodium). Regular canned tomato sauce and paste (not low-sodium or reduced-sodium). Regular tomato and vegetable juice (not low-sodium or reduced-sodium). Frozen vegetables in sauces. Meats and other protein foods Meat or fish that is salted, canned, smoked, spiced, or pickled.  Bacon, ham, sausage, hotdogs, corned beef, chipped beef, packaged lunch meats, salt pork, jerky, pickled herring, anchovies, regular canned tuna, sardines, salted nuts. Dairy Processed cheese and cheese spreads. Cheese curds. Blue cheese. Feta cheese. String cheese. Regular cottage cheese. Buttermilk. Canned milk. Fats and oils Salted butter. Regular margarine. Ghee. Bacon fat. Seasonings and other foods Onion salt, garlic salt, seasoned salt, table salt, and sea salt. Canned and packaged gravies. Worcestershire sauce. Tartar sauce. Barbecue sauce. Teriyaki sauce. Soy sauce, including reduced-sodium. Steak sauce. Fish sauce. Oyster sauce. Cocktail sauce. Horseradish that you find on the shelf. Regular ketchup and mustard. Meat flavorings and tenderizers. Bouillon cubes. Hot sauce and Tabasco sauce. Premade or packaged marinades. Premade or packaged taco seasonings. Relishes. Regular salad dressings. Salsa. Potato and tortilla chips. Corn chips and puffs. Salted popcorn and pretzels. Canned or dried soups. Pizza. Frozen entrees and pot pies. Summary  Eating less sodium can help lower your blood pressure, reduce swelling, and protect your heart, liver, and kidneys.  Most people on this plan should limit their sodium intake to 1,500-2,000 mg (milligrams) of sodium each day.  Canned, boxed, and frozen foods are high in sodium. Restaurant foods, fast foods, and pizza are also very high in sodium. You also get sodium by adding salt to food.  Try to cook at home, eat more fresh fruits and vegetables, and eat less fast food, canned, processed, or prepared foods. This information is not intended to replace advice given to you by your health care provider. Make sure you discuss any questions  you have with your health care provider. Document Released: 05/30/2002 Document Revised: 12/01/2016 Document Reviewed: 12/01/2016 Elsevier Interactive Patient Education  [employer].

## 2018-11-17 NOTE — Progress Notes (Signed)
Cardiology Office Note    Date:  11/17/2018   ID:  [name], DOB [birth year], MRN [id number]  PCP:  Willey Blade, MD  Cardiologist:   Sanda Klein, MD   Chief Complaint  Patient presents with  . Shortness of Breath    History of Present Illness:  [name] is a 84 y.o. female here for follow-up of her dual-chamber permanent pacemaker. The device was implanted in 2010 for tachycardia-bradycardia syndrome (paroxysmal atrial tachycardia, paroxysmal atrial fibrillation, symptomatic sinus bradycardia).  As usual she is accompanied by her daughter.   She reports findings concerning for heart failure.  About 2 weeks ago she had for 5 consecutive nights where she woke up extremely short of breath at 4 5:00 in the morning and sat in her chair for an hour before she could lay back in bed.  She has also noticed that her socks are hard to put on because her feet are swollen and she has gained roughly 10 pounds of weight.  She has not had any dyspnea in the last week or so.  She denies chest pain.   Review of her flow states that her typical weight has been 220-225 pounds, today she weighs 233.  She is frequently known to eat salty snacks such as pastrami sandwiches and pork rinds.  She has occasional palpitations, but is not consistently aware of the atrial fibrillation.   At her birthday celebration on November [birth year], just a week ago, she had atrial fibrillation with rapid ventricular response but was unaware of it.  She has not had syncope, bleeding problems, falls or injuries, focal neurological events.  She is on chronic warfarin anticoagulation.  Interrogation of her pacemaker actually shows a relative reduction in the burden of atrial fibrillation which is now approximately 5%.  Was about 20% a year ago.  Battery voltage suggests generator longevity of 1.2 years.  Lead parameters are excellent.  She has 91% atrial pacing and only 2.4% ventricular pacing.  Rate control is  ability during most of her episodes of atrial fibrillation with rates in the 60s-80s.  Her underlying rhythm is sinus bradycardia in 50s.  Electrocardiogram today shows atrial paced, ventricular sensed rhythm.  She has not had any recent gout episodes.  Her echocardiogram performed last October was interpreted as normal diastolic function.  On my review however the diastolic annulus velocities are actually decreased and the E/e' was 13-14, signifying probable volume overload.  She has preserved left ventricular systolic function.  She has never had embolic events that we are aware of. She has normal left ventricular systolic function by previous echo, normal nuclear stress testing, had minimal CAD on angiography 2011. She does have a history of seizures associated with resection of multiple meningiomas in the past.  No recurrence of seizures while on treatment with Topamax   Past Medical History:  Diagnosis Date  . ACL (anterior cruciate ligament) rupture    Left  . Acute MI ([place])   . Arthritis   . Back pain    Secondary to arthritis  . Benign paroxysmal positional vertigo 03/18/2016  . Brain tumor ([place])    On right side. Removed in 1989  . Bruit    Duplex doppler 03/28/05 Mildly abnormal. *Right subclavian artery: Less than 50% diameter reduction.  . Chest pain    2D Echo 04/21/08 EF = >55%. Moderate tricuspid regurgitation. Persantine Myovoew stress test 04/21/08 EF = 81%, no evidence of ischemia noted.  . Chronic anticoagulation  PAF  . Chronic edema    Mild LE edema  . Chronic kidney disease    Mild, stage 2  . Coronary artery disease   . Coronary atherosclerosis    Minimal  . Dyslipidemia   . GERD (gastroesophageal reflux disease)   . Hyperlipidemia    On pravastatin  . Memory difficulty 07/13/2014  . Meningioma ([place])    Resection in 1998  . Mild depression ([place])   . Ovarian cyst    Benign. Had a salpiingoophorectomy in 2005.  [name] PAF (paroxysmal atrial fibrillation) (HCC)     On coumadin. St. Jude PPM (serial [id number]) inserted 2010  . Paroxysmal atrial flutter ([place])   . Seizure ([place]) 2006  . Severe obesity ([place])   . Sick sinus syndrome ([place])   . Sinus node dysfunction (HCC)    S/P implantation of a dual-chamber permanent pacemaker in 2010  . SOB (shortness of breath) on exertion    Class II. Catheterization 04/30/10 showed mild CAD with mildly elevated right heart pressures.   . Tachycardia-bradycardia syndrome ([place])    S/P implantation of dual-chamber permanent pacemaker in 2010  . Trochlear nerve palsy    Right  . Vertigo     Past Surgical History:  Procedure Laterality Date  . Brain tumor removal  1989  . CARDIAC CATHETERIZATION  04/30/10   Showed mild CAD with mildly elevated right heart pressures. (Dr. Elisabeth Cara)  . CRANIOTOMY  1989   (Brain tumor) With gamma knife procedure as well.  [name] KNEE SURGERY     Bilateral, arthroscopic  . PACEMAKER INSERTION  04/27/09   Implanted by Dr. Elisabeth Cara. St. Jude Accent DR (serial [id number])    Current Medications: Outpatient Medications Prior to Visit  Medication Sig Dispense Refill  . allopurinol (ZYLOPRIM) 100 MG tablet Take 100 mg by mouth daily.    . Cholecalciferol (VITAMIN D3) 1000 units CAPS Take 1 capsule by mouth daily.    . clotrimazole-betamethasone (LOTRISONE) cream clotrimazole-betamethasone 1 %-0.05 % topical cream    . Coenzyme Q10 (CO Q 10 PO) Take by mouth.    . colchicine 0.6 MG tablet Take 0.6 mg by mouth. Reported on 03/18/2016    . ferrous sulfate 325 (65 FE) MG tablet TAKE 1 TABLET (325 MG TOTAL) BY MOUTH EVERY MONDAY, WEDNESDAY, AND FRIDAY. 90 tablet 2  . hydrochlorothiazide (HYDRODIURIL) 12.5 MG tablet TAKE 1 TABLET (12.5 MG TOTAL) BY MOUTH DAILY. 90 tablet 1  . ketorolac (TORADOL) 60 MG/2ML SOLN injection ketorolac 60 mg/2 mL intramuscular solution  Inject 60 mg    . meclizine (ANTIVERT) 25 MG tablet Take 25 mg by mouth 3 (three) times daily as needed.  2  . metoprolol succinate (TOPROL-XL)  25 MG 24 hr tablet Take 1 tablet (25 mg total) by mouth daily. Take with or immediately following a meal. 90 tablet 1  . Multiple Vitamins-Minerals (MULTIVITAMIN PO) Take by mouth daily.    . nitroGLYCERIN (NITROLINGUAL) 0.4 MG/SPRAY spray Place 3 sprays under the tongue every 5 (five) minutes x 3 doses as needed for chest pain. 12 g 3  . omeprazole (PRILOSEC) 20 MG capsule Take 20 mg by mouth daily.    . pravastatin (PRAVACHOL) 20 MG tablet Take 20 mg by mouth at bedtime.    . topiramate (TOPAMAX) 50 MG tablet Take 50 mg by mouth 2 (two) times daily.    . traMADol (ULTRAM) 50 MG tablet Take 1 tablet (50 mg total) by mouth every 6 (six) hours as needed for moderate pain.  30 tablet 0  . triamcinolone acetonide (KENALOG-40) 40 MG/ML injection Kenalog 40 mg/mL suspension for injection  Inject 60 mg x 1    . verapamil (VERELAN PM) 240 MG 24 hr capsule Take 1 capsule (240 mg total) by mouth at bedtime. 90 capsule 1  . warfarin (COUMADIN) 4 MG tablet Take 4 mg by mouth daily.    [name] KLOR-CON 10 10 MEQ tablet Take 10 mEq by mouth 2 (two) times daily.  5  . aspirin (ASPIR-LOW) 81 MG EC tablet Aspir-Low 81 mg tablet,delayed release  1 tablet orally once a day    . NEOMYCIN-BACITRACIN-POLYMYXIN EX neomycin 3.5 mg/g-polymyxin B 10,000 unit/g-dexameth 0.1 % eye oint    . neomycin-polymyxin b-dexamethasone (MAXITROL) 3.5-10000-0.1 OINT APPLY 1 A SMALL AMOUNT INTO LEFT EYE AT BEDTIME  0   No facility-administered medications prior to visit.      Allergies:   Dilantin [phenytoin sodium extended] and Phenobarbital   Social History   Socioeconomic History  . Marital status: Divorced    Spouse name: Not on file  . Number of children: 3  . Years of education: Not on file  . Highest education level: Not on file  Occupational History    Comment: Retired  Scientific laboratory technician  . Financial resource strain: Not on file  . Food insecurity:    Worry: Not on file    Inability: Not on file  . Transportation needs:     Medical: Not on file    Non-medical: Not on file  Tobacco Use  . Smoking status: Former Smoker    Years: 20.00    Types: Cigarettes    Last attempt to quit: 12/23/2007    Years since quitting: 10.9  . Smokeless tobacco: Never Used  Substance and Sexual Activity  . Alcohol use: No    Alcohol/week: 0.0 standard drinks  . Drug use: No  . Sexual activity: Not on file  Lifestyle  . Physical activity:    Days per week: Not on file    Minutes per session: Not on file  . Stress: Not on file  Relationships  . Social connections:    Talks on phone: Not on file    Gets together: Not on file    Attends religious service: Not on file    Active member of club or organization: Not on file    Attends meetings of clubs or organizations: Not on file    Relationship status: Not on file  Other Topics Concern  . Not on file  Social History Narrative  . Not on file     Family History:  The patient's family history includes Heart disease in her brother, brother, brother, father, sister, sister, and sister; Stroke in her mother.   ROS:   Please see the history of present illness.    ROS all other systems are reviewed and are negative   PHYSICAL EXAM:   VS:  BP 114/66   Pulse 61   Ht 5' 4.96" (1.65 m)   Wt 233 lb 6.4 oz (105.9 kg)   BMI 38.89 kg/m     General: Alert, oriented x3, no distress, moderate to severely obese; healthy left subclavian pacemaker site Head: no evidence of trauma, PERRL, EOMI, no exophtalmos or lid lag, no myxedema, no xanthelasma; normal ears, nose and oropharynx Neck: normal jugular venous pulsations and no hepatojugular reflux; brisk carotid pulses without delay and no carotid bruits Chest: clear to auscultation, no signs of consolidation by percussion or palpation, normal fremitus, symmetrical  and full respiratory excursions Cardiovascular: normal position and quality of the apical impulse, regular rhythm, normal first and second heart sounds, no murmurs, rubs or  gallops Abdomen: no tenderness or distention, no masses by palpation, no abnormal pulsatility or arterial bruits, normal bowel sounds, no hepatosplenomegaly Extremities: no clubbing, cyanosis, 2+ right ankle edema, 1+ left ankle edema; 2+ radial, ulnar and brachial pulses bilaterally; 2+ right femoral, posterior tibial and dorsalis pedis pulses; 2+ left femoral, posterior tibial and dorsalis pedis pulses; no subclavian or femoral bruits Neurological: grossly nonfocal Psych: Normal mood and affect  Wt Readings from Last 3 Encounters:  11/17/18 233 lb 6.4 oz (105.9 kg)  05/20/18 227 lb (103 kg)  02/09/18 225 lb 6.4 oz (102.2 kg)      Studies/Labs Reviewed:   EKG:  EKG is ordered today.  Atrial paced, ventricular sensed rhythm with prolonged AV conduction (224 ms, otherwise normal tracing.  QTc 408 ms  Recent Labs: No results found for requested labs within last 8760 hours.    ASSESSMENT:    1. Acute on chronic diastolic congestive heart failure (HCC)   2. Paroxysmal atrial fibrillation ([place])   3. Long term current use of anticoagulant   4. SSS (sick sinus syndrome) ([place])   5. Pacemaker   6. Essential hypertension   7. Coronary artery disease involving native coronary artery of native heart without angina pectoris   8. Class 2 severe obesity due to excess calories with serious comorbidity and body mass index (BMI) of 38.0 to 38.9 in adult ([place])   9. Hyperuricemia      PLAN:  In order of problems listed above:  1. Acute on chronic diastolic heart failure: She describes quite typical episodes of paroxysmal nocturnal dyspnea, as leg edema, jugular venous distention and a roughly 10 pound weight gain.  Her diet is rich in salt.  Discussed healthy ways to reduce sodium in her diet.  We will add furosemide 40 mg daily an additional 20 mEq potassium chloride supplement until her weight is down to 225 pounds or less.  After that, we can prescribe the loop diuretic "as needed".  Recheck an  echocardiogram to confirm there is been no change in LV systolic function.  Asked her to weigh daily and bring the weight log to her next appointment. 2. Paroxysmal atrial fibrillation: Overall burden of atrial fibrillation seems to be lower than her previous  baseline.  It cannot be therefore incriminated as a cause of her heart failure exacerbation.  Rate control is good during the majority of her episodes of atrial fibrillation (with the exception of her birthday when she was probably excited). CHADSVasc 5 (age 45, HTN, gender, new Dx of CHF). 3. Anticoagulation with warfarin, monitored by PCP.  Well-tolerated without bleeding complications. 4. Sinus node dysfunction she is virtually always paced when not in atrial fibrillation.  The heart rate histogram is a little blunted but probably appropriate for her inactivity. 5. PPM: Normal device function, remote downloads every 3 months and yearly office visits. 6. Essential hypertension well-controlled 7. CAD: No angina.  She had minor coronary atherosclerosis by previous cardiac catheterization 8. Obesity: She is sedentary and eats an unhealthy diet, so is not surprised that she has lost any weight.  On the other hand, I think her current 10 pound weight gain is fluid overload 9. Gout: Discussed the fact that during intense diuretic treatment is more likely to have a gout attack.  If she has gout exacerbations requiring colchicine, need to monitor  for signs of colchicine toxicity due to the interaction with verapamil. 10. CKD 3: Reevaluate clinical response and labs after new diuretic regimen in a couple of weeks.    Medication Adjustments/Labs and Tests Ordered: Current medicines are reviewed at length with the patient today.  Concerns regarding medicines are outlined above.  Medication changes, Labs and Tests ordered today are listed in the Patient Instructions below. Patient Instructions  Medication Instructions:  Dr Sallyanne Kuster has recommended  making the following medication changes: 1. START Furosemide 40 mg  2. TAKE ADDITIONAL Potassium 20 mEq on days you take Furosemide  ** Weigh daily ** TAKE Furosemide and an extra Potassium 20 mEq daily until your weight comes down to 225 pounds. ** Then take Furosemide AS NEEDED for weight over 225 pounds.  If you need a refill on your cardiac medications before your next appointment, please call your pharmacy.   Lab work: Your physician recommends that you return for lab work in 1-2 weeks.  If you have labs (blood work) drawn today and your tests are completely normal, you will receive your results only by: [name] MyChart Message (if you have MyChart) OR . A paper copy in the mail If you have any lab test that is abnormal or we need to change your treatment, we will call you to review the results.  Testing/Procedures: Your physician has requested that you have an echocardiogram. Echocardiography is a painless test that uses sound waves to create images of your heart. It provides your doctor with information about the size and shape of your heart and how well your heart's chambers and valves are working. This procedure takes approximately one hour. There are no restrictions for this procedure.  >> This will be performed at our [facility] location [place], [place] [address] [phone]  Follow-Up:  Your physician recommends that you schedule a follow-up appointment in 2-3 weeks with a NP/PA.  Dr Sallyanne Kuster recommends that you schedule a follow-up appointment in 3 months.    Low-Sodium Eating Plan Sodium, which is an element that makes up salt, helps you maintain a healthy balance of fluids in your body. Too much sodium can increase your blood pressure and cause fluid and waste to be held in your body. Your health care provider or dietitian may recommend following this plan if you have high blood pressure (hypertension), kidney disease, liver disease, or heart  failure. Eating less sodium can help lower your blood pressure, reduce swelling, and protect your heart, liver, and kidneys. What are tips for following this plan? General guidelines  Most people on this plan should limit their sodium intake to 1,500-2,000 mg (milligrams) of sodium each day. Reading food labels  The Nutrition Facts label lists the amount of sodium in one serving of the food. If you eat more than one serving, you must multiply the listed amount of sodium by the number of servings.  Choose foods with less than 140 mg of sodium per serving.  Avoid foods with 300 mg of sodium or more per serving. Shopping  Look for lower-sodium products, often labeled as "low-sodium" or "no salt added."  Always check the sodium content even if foods are labeled as "unsalted" or "no salt added".  Buy fresh foods. ? Avoid canned foods and premade or frozen meals. ? Avoid canned, cured, or processed meats  Buy breads that have less than 80 mg of sodium per slice. Cooking  Eat more home-cooked food and less restaurant, buffet, and fast  food.  Avoid adding salt when cooking. Use salt-free seasonings or herbs instead of table salt or sea salt. Check with your health care provider or pharmacist before using salt substitutes.  Cook with plant-based oils, such as canola, sunflower, or olive oil. Meal planning  When eating at a restaurant, ask that your food be prepared with less salt or no salt, if possible.  Avoid foods that contain MSG (monosodium glutamate). MSG is sometimes added to [place] food, bouillon, and some canned foods. What foods are recommended? The items listed may not be a complete list. Talk with your dietitian about what dietary choices are best for you. Grains Low-sodium cereals, including oats, puffed wheat and rice, and shredded wheat. Low-sodium crackers. Unsalted rice. Unsalted pasta. Low-sodium bread. Whole-grain breads and whole-grain pasta. Vegetables Fresh or  frozen vegetables. "No salt added" canned vegetables. "No salt added" tomato sauce and paste. Low-sodium or reduced-sodium tomato and vegetable juice. Fruits Fresh, frozen, or canned fruit. Fruit juice. Meats and other protein foods Fresh or frozen (no salt added) meat, poultry, seafood, and fish. Low-sodium canned tuna and salmon. Unsalted nuts. Dried peas, beans, and lentils without added salt. Unsalted canned beans. Eggs. Unsalted nut butters. Dairy Milk. Soy milk. Cheese that is naturally low in sodium, such as ricotta cheese, fresh mozzarella, or Swiss cheese Low-sodium or reduced-sodium cheese. Cream cheese. Yogurt. Fats and oils Unsalted butter. Unsalted margarine with no trans fat. Vegetable oils such as canola or olive oils. Seasonings and other foods Fresh and dried herbs and spices. Salt-free seasonings. Low-sodium mustard and ketchup. Sodium-free salad dressing. Sodium-free light mayonnaise. Fresh or refrigerated horseradish. Lemon juice. Vinegar. Homemade, reduced-sodium, or low-sodium soups. Unsalted popcorn and pretzels. Low-salt or salt-free chips. What foods are not recommended? The items listed may not be a complete list. Talk with your dietitian about what dietary choices are best for you. Grains Instant hot cereals. Bread stuffing, pancake, and biscuit mixes. Croutons. Seasoned rice or pasta mixes. Noodle soup cups. Boxed or frozen macaroni and cheese. Regular salted crackers. Self-rising flour. Vegetables Sauerkraut, pickled vegetables, and relishes. Olives. [place] fries. Onion rings. Regular canned vegetables (not low-sodium or reduced-sodium). Regular canned tomato sauce and paste (not low-sodium or reduced-sodium). Regular tomato and vegetable juice (not low-sodium or reduced-sodium). Frozen vegetables in sauces. Meats and other protein foods Meat or fish that is salted, canned, smoked, spiced, or pickled. Bacon, ham, sausage, hotdogs, corned beef, chipped beef, packaged  lunch meats, salt pork, jerky, pickled herring, anchovies, regular canned tuna, sardines, salted nuts. Dairy Processed cheese and cheese spreads. Cheese curds. Blue cheese. Feta cheese. String cheese. Regular cottage cheese. Buttermilk. Canned milk. Fats and oils Salted butter. Regular margarine. Ghee. Bacon fat. Seasonings and other foods Onion salt, garlic salt, seasoned salt, table salt, and sea salt. Canned and packaged gravies. Worcestershire sauce. Tartar sauce. Barbecue sauce. Teriyaki sauce. Soy sauce, including reduced-sodium. Steak sauce. Fish sauce. Oyster sauce. Cocktail sauce. Horseradish that you find on the shelf. Regular ketchup and mustard. Meat flavorings and tenderizers. Bouillon cubes. Hot sauce and Tabasco sauce. Premade or packaged marinades. Premade or packaged taco seasonings. Relishes. Regular salad dressings. Salsa. Potato and tortilla chips. Corn chips and puffs. Salted popcorn and pretzels. Canned or dried soups. Pizza. Frozen entrees and pot pies. Summary  Eating less sodium can help lower your blood pressure, reduce swelling, and protect your heart, liver, and kidneys.  Most people on this plan should limit their sodium intake to 1,500-2,000 mg (milligrams) of sodium each day.  Canned, boxed,  and frozen foods are high in sodium. Restaurant foods, fast foods, and pizza are also very high in sodium. You also get sodium by adding salt to food.  Try to cook at home, eat more fresh fruits and vegetables, and eat less fast food, canned, processed, or prepared foods. This information is not intended to replace advice given to you by your health care provider. Make sure you discuss any questions you have with your health care provider. Document Released: 05/30/2002 Document Revised: 12/01/2016 Document Reviewed: 12/01/2016 Elsevier Interactive Patient Education  2018 Tolstoy, Sanda Klein, MD  11/17/2018 2:34 PM    [address], [place], [place]  [address] Phone: [phone]; Fax: [phone]: There is

## 2018-11-23 ENCOUNTER — Other Ambulatory Visit: Payer: Self-pay | Admitting: Cardiovascular Disease

## 2018-11-25 ENCOUNTER — Other Ambulatory Visit: Payer: Medicare Other | Admitting: *Deleted

## 2018-11-25 ENCOUNTER — Ambulatory Visit (HOSPITAL_COMMUNITY): Payer: Medicare Other | Attending: Cardiovascular Disease

## 2018-11-25 ENCOUNTER — Other Ambulatory Visit: Payer: Self-pay

## 2018-11-25 DIAGNOSIS — I5033 Acute on chronic diastolic (congestive) heart failure: Secondary | ICD-10-CM | POA: Insufficient documentation

## 2018-11-26 LAB — BASIC METABOLIC PANEL
BUN/Creatinine Ratio: 26 (ref 12–28)
BUN: 44 mg/dL — ABNORMAL HIGH (ref 8–27)
CO2: 23 mmol/L (ref 20–29)
Calcium: 10.6 mg/dL — ABNORMAL HIGH (ref 8.7–10.3)
Chloride: 100 mmol/L (ref 96–106)
Creatinine, Ser: 1.68 mg/dL — ABNORMAL HIGH (ref 0.57–1.00)
GFR calc Af Amer: 32 mL/min/{1.73_m2} — ABNORMAL LOW (ref >59)
GFR calc non Af Amer: 28 mL/min/{1.73_m2} — ABNORMAL LOW (ref >59)
Glucose: 95 mg/dL (ref 65–99)
Potassium: 3.1 mmol/L — ABNORMAL LOW (ref 3.5–5.2)
Sodium: 148 mmol/L — ABNORMAL HIGH (ref 134–144)

## 2018-12-06 ENCOUNTER — Telehealth: Payer: Self-pay

## 2018-12-06 DIAGNOSIS — E876 Hypokalemia: Secondary | ICD-10-CM

## 2018-12-06 NOTE — Telephone Encounter (Signed)
-----   Message from Sanda Klein, MD sent at 11/26/2018 12:18 PM EST ----- Potassium is low. Please increase KCl to 20 mEq twice daily and recheck BMET in 1 week.

## 2018-12-06 NOTE — Telephone Encounter (Signed)
Notes recorded by Diana Eves, CMA on 12/06/2018 at 8:42 AM EST Called patient with results on 12/03/18. Patient is currently taking K+ as recommended below (20 mEq BID). Discussed with MCr.  MD clarification on potassium supplement order - Please make K-Dur 20 milliequivalents 3 times daily. Repeat labs when she has blood draw for INR on Tuesday, 12/07/18.   Patient notified of medication recommendations. Advised that she keep her follow-up appointment with Almyra Deforest, PA on 12/09/18 at 3p. Patient verbalized understanding, and agreed with plan.

## 2018-12-08 LAB — BASIC METABOLIC PANEL
BUN / CREAT RATIO: 25 (ref 12–28)
BUN: 39 mg/dL — ABNORMAL HIGH (ref 8–27)
CO2: 23 mmol/L (ref 20–29)
CREATININE: 1.55 mg/dL — AB (ref 0.57–1.00)
Calcium: 10.1 mg/dL (ref 8.7–10.3)
Chloride: 100 mmol/L (ref 96–106)
GFR calc Af Amer: 35 mL/min/{1.73_m2} — ABNORMAL LOW (ref >59)
GFR, EST NON AFRICAN AMERICAN: 31 mL/min/{1.73_m2} — AB (ref >59)
GLUCOSE: 114 mg/dL — AB (ref 65–99)
POTASSIUM: 3.4 mmol/L — AB (ref 3.5–5.2)
Sodium: 143 mmol/L (ref 134–144)

## 2018-12-09 ENCOUNTER — Ambulatory Visit: Payer: Medicare Other | Admitting: Physician Assistant

## 2018-12-09 VITALS — BP 118/70 | HR 78 | Ht 64.0 in | Wt 233.0 lb

## 2018-12-09 DIAGNOSIS — I2510 Atherosclerotic heart disease of native coronary artery without angina pectoris: Secondary | ICD-10-CM

## 2018-12-09 DIAGNOSIS — H8110 Benign paroxysmal vertigo, unspecified ear: Secondary | ICD-10-CM | POA: Diagnosis not present

## 2018-12-09 DIAGNOSIS — I5033 Acute on chronic diastolic (congestive) heart failure: Secondary | ICD-10-CM

## 2018-12-09 DIAGNOSIS — N183 Chronic kidney disease, stage 3 unspecified: Secondary | ICD-10-CM

## 2018-12-09 DIAGNOSIS — Z950 Presence of cardiac pacemaker: Secondary | ICD-10-CM

## 2018-12-09 DIAGNOSIS — E785 Hyperlipidemia, unspecified: Secondary | ICD-10-CM

## 2018-12-09 MED ORDER — KLOR-CON 10 10 MEQ PO TBCR: 20.0000 meq | EXTENDED_RELEASE_TABLET | Freq: Three times a day (TID) | ORAL | 3 refills | Status: DC

## 2018-12-09 MED ORDER — FUROSEMIDE 40 MG PO TABS: 60.0000 mg | ORAL_TABLET | Freq: Every day | ORAL | 3 refills | Status: DC

## 2018-12-09 NOTE — Patient Instructions (Signed)
Medication Instructions:  Almyra Deforest, PA has recommended making the following medication changes: 1. INCREASE Furosemide to 60 mg daily 2. STOP Hydrocholrothiazide  If you need a refill on your cardiac medications before your next appointment, please call your pharmacy.   Lab work: Your physician recommends that you return for lab work on Monday at your primary care doctor's office.  If you have labs (blood work) drawn today and your tests are completely normal, you will receive your results only by: [name] MyChart Message (if you have MyChart) OR . A paper copy in the mail If you have any lab test that is abnormal or we need to change your treatment, we will call you to review the results.  Follow-Up: Keep your follow-up appointment with Dr Sallyanne Kuster in March 2020.

## 2018-12-09 NOTE — Progress Notes (Signed)
Cardiology Office Note    Date:  12/11/2018   ID:  [name], [name] 04/29/1932, MRN [id number]  PCP:  Willey Blade, MD  Cardiologist:  Dr. Sallyanne Kuster   Chief Complaint  Patient presents with  . Follow-up    seen for Dr. Sallyanne Kuster.     History of Present Illness:  [name] is a 84 y.o. female with past medical history of benign paroxysmal positional vertigo, chronic anemia, history of PAF, CKD stage III, history of tachybradycardia syndrome s/p dual-chamber pacemaker in 2010, CAD, hyperlipidemia, and history of seizure.  She had minimal coronary artery disease on previous angiography in 2011.  She had a history of seizures associated with resection of multiple meningioma in the past.  Myoview in June 2015 showed EF 76%, no significant ischemia.  Patient was last seen by Dr. Sallyanne Kuster on 11/17/2018, she felt to be volume overloaded at the time and was started on Lasix 40 mg daily.  Echocardiogram obtained on 11/25/2018 showed EF 50 to 55%, mild MR, otherwise no significant wall motion normality.  Patient presents today for cardiology office visit.  She says her lower extremity edema has not changed much after started on the Lasix 40 mg daily.  I recommend increase Lasix to 60 mg daily.  I will stop her hydrochlorothiazide.  She will need 1 week basic metabolic panel.  Otherwise she denies significant chest discomfort.  Overall, despite the trace amount of lower extremity edema, her lung is clear and that she does not have significant JVD.   Past Medical History:  Diagnosis Date  . ACL (anterior cruciate ligament) rupture    Left  . Acute MI ([place])   . Arthritis   . Back pain    Secondary to arthritis  . Benign paroxysmal positional vertigo 03/18/2016  . Brain tumor ([place])    On right side. Removed in 1989  . Bruit    Duplex doppler 03/28/05 Mildly abnormal. *Right subclavian artery: Less than 50% diameter reduction.  . Chest pain    2D Echo 04/21/08 EF = >55%. Moderate  tricuspid regurgitation. Persantine Myovoew stress test 04/21/08 EF = 81%, no evidence of ischemia noted.  . Chronic anticoagulation    PAF  . Chronic edema    Mild LE edema  . Chronic kidney disease    Mild, stage 2  . Coronary artery disease   . Coronary atherosclerosis    Minimal  . Dyslipidemia   . GERD (gastroesophageal reflux disease)   . Hyperlipidemia    On pravastatin  . Memory difficulty 07/13/2014  . Meningioma ([place])    Resection in 1998  . Mild depression ([place])   . Ovarian cyst    Benign. Had a salpiingoophorectomy in 2005.  [name] PAF (paroxysmal atrial fibrillation) (HCC)    On coumadin. St. Jude PPM (serial [id number]) inserted 2010  . Paroxysmal atrial flutter ([place])   . Seizure ([place]) 2006  . Severe obesity ([place])   . Sick sinus syndrome ([place])   . Sinus node dysfunction (HCC)    S/P implantation of a dual-chamber permanent pacemaker in 2010  . SOB (shortness of breath) on exertion    Class II. Catheterization 04/30/10 showed mild CAD with mildly elevated right heart pressures.   . Tachycardia-bradycardia syndrome ([place])    S/P implantation of dual-chamber permanent pacemaker in 2010  . Trochlear nerve palsy    Right  . Vertigo     Past Surgical History:  Procedure Laterality Date  . Brain tumor removal  [place]  04/30/10   Showed mild CAD with mildly elevated right heart pressures. (Dr. Elisabeth Cara)  . CRANIOTOMY  1989   (Brain tumor) With gamma knife procedure as well.  [name] KNEE SURGERY     Bilateral, arthroscopic  . PACEMAKER INSERTION  04/27/09   Implanted by Dr. Elisabeth Cara. St. Jude Accent DR (serial [id number])    Current Medications: Outpatient Medications Prior to Visit  Medication Sig Dispense Refill  . allopurinol (ZYLOPRIM) 100 MG tablet Take 100 mg by mouth daily.    . Cholecalciferol (VITAMIN D3) 1000 units CAPS Take 1 capsule by mouth daily.    . clotrimazole-betamethasone (LOTRISONE) cream clotrimazole-betamethasone 1 %-0.05 % topical  cream    . Coenzyme Q10 (CO Q 10 PO) Take by mouth.    . colchicine 0.6 MG tablet Take 0.6 mg by mouth. Reported on 03/18/2016    . ferrous sulfate 325 (65 FE) MG tablet TAKE 1 TABLET (325 MG TOTAL) BY MOUTH EVERY MONDAY, WEDNESDAY, AND FRIDAY. 90 tablet 2  . ketorolac (TORADOL) 60 MG/2ML SOLN injection ketorolac 60 mg/2 mL intramuscular solution  Inject 60 mg    . meclizine (ANTIVERT) 25 MG tablet Take 25 mg by mouth 3 (three) times daily as needed.  2  . metoprolol succinate (TOPROL-XL) 25 MG 24 hr tablet TAKE 1 TABLET BY MOUTH EVERY DAY FOLLOWING A MEAL 90 tablet 1  . Multiple Vitamins-Minerals (MULTIVITAMIN PO) Take by mouth daily.    . nitroGLYCERIN (NITROLINGUAL) 0.4 MG/SPRAY spray Place 3 sprays under the tongue every 5 (five) minutes x 3 doses as needed for chest pain. 12 g 3  . omeprazole (PRILOSEC) 20 MG capsule Take 20 mg by mouth daily.    . pravastatin (PRAVACHOL) 20 MG tablet Take 20 mg by mouth at bedtime.    . topiramate (TOPAMAX) 50 MG tablet Take 50 mg by mouth 2 (two) times daily.    . traMADol (ULTRAM) 50 MG tablet Take 1 tablet (50 mg total) by mouth every 6 (six) hours as needed for moderate pain. 30 tablet 0  . triamcinolone acetonide (KENALOG-40) 40 MG/ML injection Kenalog 40 mg/mL suspension for injection  Inject 60 mg x 1    . verapamil (VERELAN PM) 240 MG 24 hr capsule TAKE 1 CAPSULE (240 MG TOTAL) BY MOUTH AT BEDTIME. 90 capsule 1  . warfarin (COUMADIN) 4 MG tablet Take 4 mg by mouth daily.    . furosemide (LASIX) 40 MG tablet Take 1 tablet (40 mg total) by mouth daily. 90 tablet 3  . hydrochlorothiazide (HYDRODIURIL) 12.5 MG tablet TAKE 1 TABLET (12.5 MG TOTAL) BY MOUTH DAILY. 90 tablet 1  . KLOR-CON 10 10 MEQ tablet Take 1 tablet (10 mEq total) by mouth 2 (two) times daily. Take an extra 20 mEq with Furosemide. (Patient not taking: Reported on 12/09/2018) 240 tablet 3   No facility-administered medications prior to visit.      Allergies:   Dilantin [phenytoin  sodium extended] and Phenobarbital   Social History   Socioeconomic History  . Marital status: Divorced    Spouse name: Not on file  . Number of children: 3  . Years of education: Not on file  . Highest education level: Not on file  Occupational History    Comment: Retired  Scientific laboratory technician  . Financial resource strain: Not on file  . Food insecurity:    Worry: Not on file    Inability: Not on file  . Transportation needs:    Medical:  Not on file    Non-medical: Not on file  Tobacco Use  . Smoking status: Former Smoker    Years: 20.00    Types: Cigarettes    Last attempt to quit: 12/23/2007    Years since quitting: 10.9  . Smokeless tobacco: Never Used  Substance and Sexual Activity  . Alcohol use: No    Alcohol/week: 0.0 standard drinks  . Drug use: No  . Sexual activity: Not on file  Lifestyle  . Physical activity:    Days per week: Not on file    Minutes per session: Not on file  . Stress: Not on file  Relationships  . Social connections:    Talks on phone: Not on file    Gets together: Not on file    Attends religious service: Not on file    Active member of club or organization: Not on file    Attends meetings of clubs or organizations: Not on file    Relationship status: Not on file  Other Topics Concern  . Not on file  Social History Narrative  . Not on file     Family History:  The patient's family history includes Heart disease in her brother, brother, brother, father, sister, sister, and sister; Stroke in her mother.   ROS:   Please see the history of present illness.    ROS All other systems reviewed and are negative.   PHYSICAL EXAM:   VS:  BP 118/70   Pulse 78   Ht 5\' 4"  (1.626 m)   Wt 233 lb (105.7 kg)   BMI 39.99 kg/m    GEN: Well nourished, well developed, in no acute distress  HEENT: normal  Neck: no JVD, carotid bruits, or masses Cardiac: RRR; no murmurs, rubs, or gallops,no edema  Respiratory:  clear to auscultation bilaterally, normal  work of breathing GI: soft, nontender, nondistended, + BS MS: no deformity or atrophy  Skin: warm and dry, no rash Neuro:  Alert and Oriented x 3, Strength and sensation are intact Psych: euthymic mood, full affect  Wt Readings from Last 3 Encounters:  12/09/18 233 lb (105.7 kg)  11/17/18 233 lb 6.4 oz (105.9 kg)  05/20/18 227 lb (103 kg)      Studies/Labs Reviewed:   EKG:  EKG is not ordered today.    Recent Labs: 12/07/2018: BUN 39; Creatinine, Ser 1.55; Potassium 3.4; Sodium 143   Lipid Panel No results found for: CHOL, TRIG, HDL, CHOLHDL, VLDL, LDLCALC, LDLDIRECT  Additional studies/ records that were reviewed today include:   Cath 05/01/2010     Myoview 06/20/2014 Rest ECG: NSR - Normal EKG  Stress ECG: No significant change from baseline ECG  QPS Raw Data Images:  Normal; no motion artifact; normal heart/lung ratio. Stress Images:  Normal homogeneous uptake in all areas of the myocardium. Rest Images:  Normal homogeneous uptake in all areas of the myocardium. Subtraction (SDS):  Normal  Impression Exercise Capacity:  Lexiscan with no exercise. BP Response:  Normal blood pressure response. Clinical Symptoms:  Mild shortness of breath ECG Impression:  No significant ST segment change suggestive of ischemia. Comparison with Prior Nuclear Study: No significant change from previous study  Overall Impression:  Normal stress nuclear study.  LV Wall Motion:  NL LV Function, EF 76%; NL Wall Motion    Echo 11/25/2018 LV EF: 50% -   55% Study Conclusions  - Left ventricle: The cavity size was mildly reduced. There was   mild focal basal hypertrophy of  the septum. Systolic function was   normal. The estimated ejection fraction was in the range of 50%   to 55%. Wall motion was normal; there were no regional wall   motion abnormalities. The study is indeterminate for the   evaluation of LV diastolic function. - Aortic valve: Trileaflet; mildly thickened,  mildly calcified   leaflets. Sclerosis without stenosis. - Mitral valve: There was mild regurgitation. - Tricuspid valve: There was trivial regurgitation. - Pulmonary arteries: Systolic pressure was within the normal   range.  Impressions:  - EF 53% by 3D imaging, which agrees with visual and 2D estimates.   GLS mildly abnormal at -16.2%. No significant wall motion   abnormalities.   ASSESSMENT:    1. Acute on chronic diastolic heart failure ([place])   2. Benign paroxysmal positional vertigo, unspecified laterality   3. CKD (chronic kidney disease), stage III ([place])   4. Pacemaker   5. Coronary artery disease involving native coronary artery of native heart without angina pectoris   6. Hyperlipidemia LDL goal <70      PLAN:  In order of problems listed above:  1. Acute on chronic diastolic heart failure: Continue to have 1-2+ lower extremity edema, will increase Lasix to 60 mg daily.  Obtain basic metabolic panel next Monday at PCPs office.  Unclear how much of lower extremity edema is contributed by verapamil.  2. CKD stage III: Pending upcoming basic metabolic panel  3. CAD: Previous cardiac catheterization in 2011 showed 52% disease in proximal LAD, otherwise no significant disease.  Patient denies any significant chest discomfort  4. Tachybradycardia syndrome status post pacemaker: Followed by Dr. Sallyanne Kuster  5. Hyperlipidemia: Continue Pravachol 20 mg daily.  6. Benign paroxysmal positional vertigo: Use meclizine as needed.    Medication Adjustments/Labs and Tests Ordered: Current medicines are reviewed at length with the patient today.  Concerns regarding medicines are outlined above.  Medication changes, Labs and Tests ordered today are listed in the Patient Instructions below. Patient Instructions  Medication Instructions:  Almyra Deforest, PA has recommended making the following medication changes: 1. INCREASE Furosemide to 60 mg daily 2. STOP Hydrocholrothiazide  If  you need a refill on your cardiac medications before your next appointment, please call your pharmacy.   Lab work: Your physician recommends that you return for lab work on Monday at your primary care doctor's office.  If you have labs (blood work) drawn today and your tests are completely normal, you will receive your results only by: [name] MyChart Message (if you have MyChart) OR . A paper copy in the mail If you have any lab test that is abnormal or we need to change your treatment, we will call you to review the results.  Follow-Up: Keep your follow-up appointment with Dr Sallyanne Kuster in March 2020.    Hilbert Corrigan, [place]  12/11/2018 11:48 PM    [facility] [address], [place], [place]  [address] Phone: [phone]; Fax: [phone]

## 2018-12-11 ENCOUNTER — Encounter: Payer: Self-pay | Admitting: Physician Assistant

## 2018-12-11 LAB — CUP PACEART REMOTE DEVICE CHECK
Battery Remaining Longevity: 18 mo
Battery Voltage: 2.74 V
Brady Statistic AS VP Percent: 1 %
Brady Statistic AS VS Percent: 3.6 %
Brady Statistic RA Percent Paced: 91 %
Implantable Lead Location: 753860
Lead Channel Impedance Value: 330 Ohm
Lead Channel Pacing Threshold Amplitude: 0.625 V
Lead Channel Pacing Threshold Pulse Width: 0.4 ms
Lead Channel Pacing Threshold Pulse Width: 0.4 ms
Lead Channel Sensing Intrinsic Amplitude: 12 mV
Lead Channel Setting Pacing Amplitude: 1.75 V
MDC IDC LEAD IMPLANT DT: 20100507
MDC IDC LEAD IMPLANT DT: 20100507
MDC IDC LEAD LOCATION: 753859
MDC IDC MSMT BATTERY REMAINING PERCENTAGE: 15 %
MDC IDC MSMT LEADCHNL RA SENSING INTR AMPL: 5 mV
MDC IDC MSMT LEADCHNL RV IMPEDANCE VALUE: 560 Ohm
MDC IDC MSMT LEADCHNL RV PACING THRESHOLD AMPLITUDE: 1.5 V
MDC IDC PG IMPLANT DT: 20100507
MDC IDC PG SERIAL: 2261146
MDC IDC SESS DTM: 20191017072040
MDC IDC SET LEADCHNL RA PACING AMPLITUDE: 1.625
MDC IDC SET LEADCHNL RV PACING PULSEWIDTH: 0.4 ms
MDC IDC SET LEADCHNL RV SENSING SENSITIVITY: 2 mV
MDC IDC STAT BRADY AP VP PERCENT: 1 %
MDC IDC STAT BRADY AP VS PERCENT: 96 %
MDC IDC STAT BRADY RV PERCENT PACED: 2.4 %

## 2018-12-14 ENCOUNTER — Encounter: Payer: Self-pay | Admitting: *Deleted

## 2018-12-14 LAB — BASIC METABOLIC PANEL
BUN/Creatinine Ratio: 22 (ref 12–28)
BUN: 32 mg/dL — ABNORMAL HIGH (ref 8–27)
CALCIUM: 10 mg/dL (ref 8.7–10.3)
CHLORIDE: 101 mmol/L (ref 96–106)
CO2: 22 mmol/L (ref 20–29)
Creatinine, Ser: 1.47 mg/dL — ABNORMAL HIGH (ref 0.57–1.00)
GFR calc Af Amer: 38 mL/min/{1.73_m2} — ABNORMAL LOW (ref >59)
GFR calc non Af Amer: 33 mL/min/{1.73_m2} — ABNORMAL LOW (ref >59)
GLUCOSE: 56 mg/dL — AB (ref 65–99)
POTASSIUM: 4.2 mmol/L (ref 3.5–5.2)
Sodium: 144 mmol/L (ref 134–144)

## 2018-12-14 NOTE — Progress Notes (Signed)
Kidney function and electrolyte stable.

## 2018-12-20 ENCOUNTER — Encounter: Payer: Self-pay | Admitting: Podiatry

## 2018-12-20 ENCOUNTER — Ambulatory Visit: Payer: Medicare Other | Admitting: Podiatry

## 2018-12-20 DIAGNOSIS — B351 Tinea unguium: Secondary | ICD-10-CM | POA: Diagnosis not present

## 2018-12-20 DIAGNOSIS — M79675 Pain in left toe(s): Secondary | ICD-10-CM

## 2018-12-20 DIAGNOSIS — M79674 Pain in right toe(s): Secondary | ICD-10-CM | POA: Diagnosis not present

## 2018-12-20 DIAGNOSIS — Z9229 Personal history of other drug therapy: Secondary | ICD-10-CM

## 2018-12-21 ENCOUNTER — Encounter: Payer: Self-pay | Admitting: Podiatry

## 2019-01-06 ENCOUNTER — Ambulatory Visit (INDEPENDENT_AMBULATORY_CARE_PROVIDER_SITE_OTHER): Payer: Medicare Other

## 2019-01-06 DIAGNOSIS — I495 Sick sinus syndrome: Secondary | ICD-10-CM

## 2019-01-07 NOTE — Progress Notes (Signed)
Remote pacemaker transmission.   

## 2019-01-08 LAB — CUP PACEART REMOTE DEVICE CHECK
Battery Remaining Longevity: 13 mo
Battery Remaining Percentage: 10 %
Battery Voltage: 2.71 V
Brady Statistic AP VS Percent: 88 %
Brady Statistic RA Percent Paced: 84 %
Date Time Interrogation Session: 20200116075304
Implantable Lead Implant Date: 20100507
Implantable Lead Location: 753860
Implantable Pulse Generator Implant Date: 20100507
Lead Channel Impedance Value: 340 Ohm
Lead Channel Pacing Threshold Pulse Width: 0.4 ms
Lead Channel Sensing Intrinsic Amplitude: 5 mV
Lead Channel Setting Sensing Sensitivity: 4 mV
MDC IDC LEAD IMPLANT DT: 20100507
MDC IDC LEAD LOCATION: 753859
MDC IDC MSMT LEADCHNL RA PACING THRESHOLD AMPLITUDE: 0.75 V
MDC IDC MSMT LEADCHNL RV IMPEDANCE VALUE: 600 Ohm
MDC IDC MSMT LEADCHNL RV PACING THRESHOLD AMPLITUDE: 1.25 V
MDC IDC MSMT LEADCHNL RV PACING THRESHOLD PULSEWIDTH: 0.4 ms
MDC IDC MSMT LEADCHNL RV SENSING INTR AMPL: 12 mV
MDC IDC SET LEADCHNL RA PACING AMPLITUDE: 1.75 V
MDC IDC SET LEADCHNL RV PACING AMPLITUDE: 1.5 V
MDC IDC SET LEADCHNL RV PACING PULSEWIDTH: 0.4 ms
MDC IDC STAT BRADY AP VP PERCENT: 1 %
MDC IDC STAT BRADY AS VP PERCENT: 1 %
MDC IDC STAT BRADY AS VS PERCENT: 12 %
MDC IDC STAT BRADY RV PERCENT PACED: 1 %
Pulse Gen Model: 2210
Pulse Gen Serial Number: 2261146

## 2019-01-15 NOTE — Progress Notes (Signed)
Subjective: [name] is a 85 y.o. y.o. female who presents today with painful, discolored, thick toenails  which interfere with daily activities. Pain is aggravated when wearing enclosed shoe gear. Pain is relieved with periodic professional debridement.   Patient's PCP is Willey Blade, MD .   Current Outpatient Medications:  .  allopurinol (ZYLOPRIM) 100 MG tablet, Take 100 mg by mouth daily., Disp: , Rfl:  .  Cholecalciferol (VITAMIN D3) 1000 units CAPS, Take 1 capsule by mouth daily., Disp: , Rfl:  .  clotrimazole-betamethasone (LOTRISONE) cream, clotrimazole-betamethasone 1 %-0.05 % topical cream, Disp: , Rfl:  .  Coenzyme Q10 (CO Q 10 PO), Take by mouth., Disp: , Rfl:  .  colchicine 0.6 MG tablet, Take 0.6 mg by mouth. Reported on 03/18/2016, Disp: , Rfl:  .  ferrous sulfate 325 (65 FE) MG tablet, TAKE 1 TABLET (325 MG TOTAL) BY MOUTH EVERY MONDAY, WEDNESDAY, AND FRIDAY., Disp: 90 tablet, Rfl: 2 .  furosemide (LASIX) 40 MG tablet, Take 1.5 tablets (60 mg total) by mouth daily., Disp: 135 tablet, Rfl: 3 .  ketorolac (TORADOL) 60 MG/2ML SOLN injection, ketorolac 60 mg/2 mL intramuscular solution  Inject 60 mg, Disp: , Rfl:  .  KLOR-CON 10 10 MEQ tablet, Take 2 tablets (20 mEq total) by mouth 3 (three) times daily., Disp: 540 tablet, Rfl: 3 .  meclizine (ANTIVERT) 25 MG tablet, Take 25 mg by mouth 3 (three) times daily as needed., Disp: , Rfl: 2 .  metoprolol succinate (TOPROL-XL) 25 MG 24 hr tablet, TAKE 1 TABLET BY MOUTH EVERY DAY FOLLOWING A MEAL, Disp: 90 tablet, Rfl: 1 .  Multiple Vitamins-Minerals (MULTIVITAMIN PO), Take by mouth daily., Disp: , Rfl:  .  nitroGLYCERIN (NITROLINGUAL) 0.4 MG/SPRAY spray, Place 3 sprays under the tongue every 5 (five) minutes x 3 doses as needed for chest pain., Disp: 12 g, Rfl: 3 .  omeprazole (PRILOSEC) 20 MG capsule, Take 20 mg by mouth daily., Disp: , Rfl:  .  potassium chloride (KLOR-CON M10) 10 MEQ tablet, Klor-Con M10 mEq  tablet,extended release, Disp: , Rfl:  .  pravastatin (PRAVACHOL) 20 MG tablet, Take 20 mg by mouth at bedtime., Disp: , Rfl:  .  topiramate (TOPAMAX) 50 MG tablet, Take 50 mg by mouth 2 (two) times daily., Disp: , Rfl:  .  traMADol (ULTRAM) 50 MG tablet, Take 1 tablet (50 mg total) by mouth every 6 (six) hours as needed for moderate pain., Disp: 30 tablet, Rfl: 0 .  triamcinolone acetonide (KENALOG-40) 40 MG/ML injection, Kenalog 40 mg/mL suspension for injection  Inject 60 mg x 1, Disp: , Rfl:  .  verapamil (VERELAN PM) 240 MG 24 hr capsule, TAKE 1 CAPSULE (240 MG TOTAL) BY MOUTH AT BEDTIME., Disp: 90 capsule, Rfl: 1 .  warfarin (COUMADIN) 4 MG tablet, Take 4 mg by mouth daily., Disp: , Rfl:    Allergies  Allergen Reactions  . Dilantin [Phenytoin Sodium Extended] Hives  . Phenobarbital Hives           Objective: Vascular Examination: Capillary refill time less than 3 seconds to 10 digits. Dorsalis pedis pulses and posterior tibial pulses diminished bilaterally No digital hair x10 digits Skin temperature gradient within normal limits bilaterally  Dermatological Examination: Skin slightly atrophic bilaterally  Toenails 1-5 b/l discolored, thick, dystrophic with subungual debris and pain with palpation to nailbeds due to thickness of nails.  Musculoskeletal: Muscle strength 5/5 to all LE muscle groups  Hammertoe deformity left second and third digits  Neurological:  Sensation intact with 10 gram monofilament.  Assessment: Painful onychomycosis toenails 1-5 b/l in patient on blood thinner.   Plan: 1. Toenails 1-5 b/l were debrided in length and girth without iatrogenic bleeding. 2. Patient to continue soft, supportive shoe gear 3. Patient to report any pedal injuries to medical professional immediately. 4. Avoid self trimming due to use of blood thinner. 5. Follow up 3 months. Patient/POA to call should there be a concern in the interim.

## 2019-01-17 LAB — CUP PACEART INCLINIC DEVICE CHECK
Implantable Lead Implant Date: 20100507
Implantable Lead Implant Date: 20100507
Implantable Lead Location: 753859
Implantable Pulse Generator Implant Date: 20100507
MDC IDC LEAD LOCATION: 753860
MDC IDC SESS DTM: 20200127144929
Pulse Gen Model: 2210
Pulse Gen Serial Number: 2261146

## 2019-02-09 ENCOUNTER — Ambulatory Visit: Payer: Medicare Other | Admitting: Adult Health

## 2019-02-21 ENCOUNTER — Ambulatory Visit: Payer: Medicare Other | Admitting: Podiatry

## 2019-02-22 ENCOUNTER — Encounter: Payer: Self-pay | Admitting: Podiatry

## 2019-02-22 ENCOUNTER — Ambulatory Visit: Payer: Medicare Other | Admitting: Podiatry

## 2019-02-22 DIAGNOSIS — M79674 Pain in right toe(s): Secondary | ICD-10-CM

## 2019-02-22 DIAGNOSIS — M79675 Pain in left toe(s): Secondary | ICD-10-CM | POA: Diagnosis not present

## 2019-02-22 DIAGNOSIS — B351 Tinea unguium: Secondary | ICD-10-CM | POA: Diagnosis not present

## 2019-02-22 DIAGNOSIS — Z9229 Personal history of other drug therapy: Secondary | ICD-10-CM

## 2019-02-22 NOTE — Patient Instructions (Signed)

## 2019-03-06 NOTE — Progress Notes (Signed)
Subjective: [name] is a 85 y.o. y.o. female on chronic anticoagulation (warfarin) who presents today with painful, discolored, thick toenails  which interfere with daily activities. Pain is aggravated when wearing enclosed shoe gear. Pain is relieved with periodic professional debridement.   Patient's PCP is Willey Blade, MD.  Pt states left great toe is painful on today. She feels it is ingrown. She denies any redness or drainage.   Current Outpatient Medications:  .  allopurinol (ZYLOPRIM) 100 MG tablet, Take 100 mg by mouth daily., Disp: , Rfl:  .  Cholecalciferol (VITAMIN D3) 1000 units CAPS, Take 1 capsule by mouth daily., Disp: , Rfl:  .  clotrimazole-betamethasone (LOTRISONE) cream, clotrimazole-betamethasone 1 %-0.05 % topical cream, Disp: , Rfl:  .  Coenzyme Q10 (CO Q 10 PO), Take by mouth., Disp: , Rfl:  .  colchicine 0.6 MG tablet, Take 0.6 mg by mouth. Reported on 03/18/2016, Disp: , Rfl:  .  ferrous sulfate 325 (65 FE) MG tablet, TAKE 1 TABLET (325 MG TOTAL) BY MOUTH EVERY MONDAY, WEDNESDAY, AND FRIDAY., Disp: 90 tablet, Rfl: 2 .  furosemide (LASIX) 40 MG tablet, Take 1.5 tablets (60 mg total) by mouth daily., Disp: 135 tablet, Rfl: 3 .  ketorolac (TORADOL) 60 MG/2ML SOLN injection, ketorolac 60 mg/2 mL intramuscular solution  Inject 60 mg, Disp: , Rfl:  .  KLOR-CON 10 10 MEQ tablet, Take 2 tablets (20 mEq total) by mouth 3 (three) times daily., Disp: 540 tablet, Rfl: 3 .  meclizine (ANTIVERT) 25 MG tablet, Take 25 mg by mouth 3 (three) times daily as needed., Disp: , Rfl: 2 .  metoprolol succinate (TOPROL-XL) 25 MG 24 hr tablet, TAKE 1 TABLET BY MOUTH EVERY DAY FOLLOWING A MEAL, Disp: 90 tablet, Rfl: 1 .  Multiple Vitamins-Minerals (MULTIVITAMIN PO), Take by mouth daily., Disp: , Rfl:  .  nitroGLYCERIN (NITROLINGUAL) 0.4 MG/SPRAY spray, Place 3 sprays under the tongue every 5 (five) minutes x 3 doses as needed for chest pain., Disp: 12 g, Rfl: 3 .  omeprazole  (PRILOSEC) 20 MG capsule, Take 20 mg by mouth daily., Disp: , Rfl:  .  potassium chloride (KLOR-CON M10) 10 MEQ tablet, Klor-Con M10 mEq tablet,extended release, Disp: , Rfl:  .  pravastatin (PRAVACHOL) 20 MG tablet, Take 20 mg by mouth at bedtime., Disp: , Rfl:  .  tobramycin (TOBREX) 0.3 % ophthalmic solution, tobramycin 0.3 % eye drops, Disp: , Rfl:  .  topiramate (TOPAMAX) 50 MG tablet, Take 50 mg by mouth 2 (two) times daily., Disp: , Rfl:  .  traMADol (ULTRAM) 50 MG tablet, Take 1 tablet (50 mg total) by mouth every 6 (six) hours as needed for moderate pain., Disp: 30 tablet, Rfl: 0 .  triamcinolone acetonide (KENALOG-40) 40 MG/ML injection, Kenalog 40 mg/mL suspension for injection  Inject 60 mg x 1, Disp: , Rfl:  .  verapamil (VERELAN PM) 240 MG 24 hr capsule, TAKE 1 CAPSULE (240 MG TOTAL) BY MOUTH AT BEDTIME., Disp: 90 capsule, Rfl: 1 .  warfarin (COUMADIN) 4 MG tablet, Take 4 mg by mouth daily., Disp: , Rfl:    Allergies  Allergen Reactions  . Dilantin [Phenytoin Sodium Extended] Hives  . Phenobarbital Hives           Objective: Vascular Examination: Capillary refill time less than 3 seconds x 10 digits.  Dorsalis pedis pulses diminished b/l.  Posterior tibial pulses diminished b/l.  No digital hair x 10 digits.  Skin temperature gradient WNL b/l.  Dermatological Examination:  Skin slightly atrophic bilaterally.  Toenails 1-5 b/l discolored, thick, dystrophic with subungual debris and pain with palpation to nailbeds due to thickness of nails.  Left great toe medial border incurvated with tenderness to palpation. No edema, no erythema, no drainage.  Musculoskeletal: Muscle strength 5/5 to all LE muscle groups.  Hammertoe deformity left 2nd, 3rd digits.  Neurological: Sensation intact with 10 gram monofilament.  Assessment: Painful onychomycosis toenails 1-5 b/l in patient on blood thinner.   Plan: 1. Toenails 1-5 b/l were debrided in length and girth without  iatrogenic bleeding. Left great toe offending nail border debrided and curretaged. Border cleansed with alcohol. Triple antibiotic ointment applied. Pt noted relief post-treatment on today. 2. Patient to continue soft, supportive shoe gear. 3. Patient to report any pedal injuries to medical professional immediately. 4. Avoid self trimming due to use of blood thinner. 5. Follow up 3 months.  6. Patient/POA to call should there be a concern in the interim.

## 2019-03-16 ENCOUNTER — Telehealth: Payer: Self-pay | Admitting: Cardiovascular Disease

## 2019-03-16 NOTE — Telephone Encounter (Signed)
Spoke to pt. Per Dr. Sallyanne Kuster, pt does not need to come into the office. Explained to pt that Dr. Sallyanne Kuster will send her a message with the information needed for the [facility] meeting. Pt is concerned that it may not work, her daughter [name] may know how to get into her MyChart, but she is not sure. Pt stated she is willing to try it, but if it does not work, she would like a telephone visit. Told pt this will be scheduled at her same appt time. Friday, 03/18/19 at 9:40 AM. Appointment changed to Virtual visit.

## 2019-03-16 NOTE — Telephone Encounter (Signed)
Thanks MCr 

## 2019-03-17 ENCOUNTER — Other Ambulatory Visit: Payer: Self-pay

## 2019-03-18 ENCOUNTER — Telehealth (INDEPENDENT_AMBULATORY_CARE_PROVIDER_SITE_OTHER): Payer: Medicare Other | Admitting: Cardiovascular Disease

## 2019-03-18 DIAGNOSIS — G8929 Other chronic pain: Secondary | ICD-10-CM

## 2019-03-18 DIAGNOSIS — I2510 Atherosclerotic heart disease of native coronary artery without angina pectoris: Secondary | ICD-10-CM | POA: Diagnosis not present

## 2019-03-18 DIAGNOSIS — I5033 Acute on chronic diastolic (congestive) heart failure: Secondary | ICD-10-CM

## 2019-03-18 DIAGNOSIS — M25512 Pain in left shoulder: Secondary | ICD-10-CM

## 2019-03-18 DIAGNOSIS — N183 Chronic kidney disease, stage 3 unspecified: Secondary | ICD-10-CM

## 2019-03-18 DIAGNOSIS — E669 Obesity, unspecified: Secondary | ICD-10-CM

## 2019-03-18 DIAGNOSIS — R0602 Shortness of breath: Secondary | ICD-10-CM

## 2019-03-18 DIAGNOSIS — I480 Paroxysmal atrial fibrillation: Secondary | ICD-10-CM

## 2019-03-18 DIAGNOSIS — Z9229 Personal history of other drug therapy: Secondary | ICD-10-CM

## 2019-03-18 DIAGNOSIS — M25511 Pain in right shoulder: Secondary | ICD-10-CM

## 2019-03-18 DIAGNOSIS — Z950 Presence of cardiac pacemaker: Secondary | ICD-10-CM

## 2019-03-18 DIAGNOSIS — I495 Sick sinus syndrome: Secondary | ICD-10-CM

## 2019-03-18 DIAGNOSIS — I10 Essential (primary) hypertension: Secondary | ICD-10-CM

## 2019-03-18 MED ORDER — FUROSEMIDE 40 MG PO TABS: 80.0000 mg | ORAL_TABLET | Freq: Every day | ORAL | 3 refills | Status: DC

## 2019-03-18 NOTE — Progress Notes (Signed)
Virtual Visit via Telephone Note    Evaluation Performed:  Follow-up visit  This visit type was conducted due to national recommendations for restrictions regarding the COVID-19 Pandemic (e.g. social distancing).  This format is felt to be most appropriate for this patient at this time.  All issues noted in this document were discussed and addressed.  No physical exam was performed (except for noted visual exam findings with Video Visits).  Please refer to the patient's chart (MyChart message for video visits and phone note for telephone visits) for the patient's consent to telehealth for [facility].  Date:  03/18/2019   ID:  [name], DOB 1932/03/27, MRN [id number]  Patient Location:   [address] [place] [address]   Provider location:   [place], [place]  PCP:  Willey Blade, MD  Cardiologist:  Sanda Klein, MD  Electrophysiologist:  None   Chief Complaint:  Dyspnea  History of Present Illness:    [name] is a 85 y.o. female who presents via audio conferencing for a telehealth visit today.  Her daughter [name] was also on the line.  Her biggest complaints recently are low back pain and shoulder pain. Acetaminophen and tramadol offer little relief.  Overall she is doing well, but she continues to have shortness of breath if she has to do any activity that involves standing up for more than 5 minutes.  For example she becomes dyspneic when taking a shower.  On the other hand she is able to use her sitting down stationary bike to bike for 3 miles a day 5 days a week, without a lot of problems with dyspnea.  She has mild ankle swelling.  Today she weighed 230 pounds (her previous estimated dry weight was 225 pounds).  She has not had problems with paroxysmal internal dyspnea, orthopnea, supine cough or wheezing.  She denies dizziness or syncope and is not aware of any palpitations.  She is compliant with warfarin anticoagulation and had her pro time  checked yesterday at Dr. Roland Earl office (also had a basic metabolic panel, do not yet have those results).  She denies any focal neurological complaints and does not have chest pain.  The most recent previous labs were from late December when her potassium was 4.2 and her creatinine is 1.46, better than it had been earlier in the month.  Her last pacemaker download from January shows estimated generator longevity of about a year, approximately 85% atrial pacing with rare ventricular pacing and a stable burden of paroxysmal atrial fibrillation at around 4% with satisfactory ventricular rate control.  She is not pacemaker dependent.  The patient does not symptoms concerning for COVID-19 infection (fever, chills, cough, or new SHORTNESS OF BREATH).    Prior CV studies:   The following studies were reviewed today:  Notes, labs, most recent remote PM download.  Past Medical History:  Diagnosis Date   ACL (anterior cruciate ligament) rupture    Left   Acute MI ([place])    Arthritis    Back pain    Secondary to arthritis   Benign paroxysmal positional vertigo 03/18/2016   Brain tumor [facility])    On right side. Removed in 1989   Bruit    Duplex doppler 03/28/05 Mildly abnormal. *Right subclavian artery: Less than 50% diameter reduction.   Chest pain    2D Echo 04/21/08 EF = >55%. Moderate tricuspid regurgitation. Persantine Myovoew stress test 04/21/08 EF = 81%, no evidence of ischemia noted.   Chronic anticoagulation  PAF   Chronic edema    Mild LE edema   Chronic kidney disease    Mild, stage 2   Coronary artery disease    Coronary atherosclerosis    Minimal   Dyslipidemia    GERD (gastroesophageal reflux disease)    Hyperlipidemia    On pravastatin   Memory difficulty 07/13/2014   Meningioma (HCC)    Resection in 1998   Mild depression (HCC)    Ovarian cyst    Benign. Had a salpiingoophorectomy in 2005.   PAF (paroxysmal atrial fibrillation) (HCC)    On  coumadin. St[employer] Jude PPM (serial [id number]) inserted 2010   Paroxysmal atrial flutter ([place])    Seizure ([place]) 2006   Severe obesity (HCC)    Sick sinus syndrome ([place])    Sinus node dysfunction (HCC)    S/P implantation of a dual-chamber permanent pacemaker in 2010   SOB (shortness of breath) on exertion    Class II. Catheterization 04/30/10 showed mild CAD with mildly elevated right heart pressures.    Tachycardia-bradycardia syndrome ([name])    S/P implantation of dual-chamber permanent pacemaker in 2010   Trochlear nerve palsy    Right   Vertigo    Past Surgical History:  Procedure Laterality Date   Brain tumor removal  1989   CARDIAC CATHETERIZATION  04/30/10   Showed mild CAD with mildly elevated right heart pressures. (Dr. Elisabeth Cara)   [place]   (Brain tumor) With gamma knife procedure as well.   KNEE SURGERY     Bilateral, arthroscopic   PACEMAKER INSERTION  04/27/09   Implanted by Dr. Elisabeth Cara. St. Jude Acc[employer] DR (serial [id number])     No outpatient medications have been marked as taking for the 03/18/19 encounter (Telemedicine) with Austina Constantin, Dani Gobble, MD.     Allergies:   Dilantin [phenytoin sodium extended] and Phenobarbital   Social History   Tobacco Use   Smoking status: Former Smoker    Years: 20.00    Types: Cigarettes    Last attempt to quit: 12/23/2007    Years since quitting: 11.2   Smokeless tobacco: Never Used  Substance Use Topics   Alcohol use: No    Alcohol/week: 0.0 standard drinks   Drug use: No     Family Hx: The patient's family history includes Heart disease in her brother, brother, brother, father, sister, sister, and sister; Stroke in her mother.  ROS:   Please see the history of present illness.     All other systems reviewed and are negative.   Labs/Other Tests and Data Reviewed:    Recent Labs: 12/13/2018: BUN 32; Creatinine, Ser 1.47; Potassium 4.2; Sodium 144   Recent Lipid Panel No results found for: CHOL, TRIG,  HDL, CHOLHDL, LDLCALC, LDLDIRECT  Wt Readings from Last 3 Encounters:  03/18/19 230 lb (104.3 kg)  12/09/18 233 lb (105.7 kg)  11/17/18 233 lb 6.4 oz (105.9 kg)     Exam:    Vital Signs:  BP 118/66    Pulse 60    Ht 5' 4.96" (1.65 m)    Wt 230 lb (104.3 kg)    BMI 38.32 kg/m    She sounded comfortable speaking on the phone, speaking in lengthy sentences without stopping to catch her breath.  ASSESSMENT & PLAN:    1.  Acute on chronic diastolic heart failure: She no longer has orthopnea, PND or edema, but continues to have NYHA functional class II-3 exertional dyspnea.  Increase her furosemide to 80 mg once daily.  I think she will do fine on the same dose of potassium.  We will retrieve the labs performed yesterday and Dr. Roland Earl office and may decide to give a different potassium supplement dose depending on that.  Target "dry weight" 225 pounds.  Reinforced the need for sodium restriction. 2. Paroxysmal atrial fibrillation: Stable and low burden of atrial fibrillation at roughly 4%, good ventricular rate control, on anticoagulants CHADSVasc 5 (age 85, HTN, gender, new Dx of CHF). 3. Anticoagulation monitored by Dr. Karlton Lemon.  Well-tolerated without bleeding complications. 4. Sinus node dysfunction  satisfactory heart rate histogram with current sensor settings, considering that she is fairly sedentary and the only exercise she does is on a stationary bicycle. 5. PPM: Anticipates need for generator change out in roughly 1 year.  Normal device function.  Continue remote downloads every 3 months. 6. Essential hypertension  her daughter checks her blood pressure periodically and it is consistently normal range 7. CAD:  Asymptomatic.  She had minor coronary atherosclerosis by previous cardiac catheterization 8. Obesity:  While encourage her to reduce her caloric intake and lose some real weight, we discussed the difference between fluid weight and true weight loss/gain. 9. Gout:  No recent  flares.  Previously discussed the interaction between verapamil/colchicine and the need to monitor for signs of toxicity when taking colchicine. 10. CKD 3:  Stable renal function. Get labs recently performed by Dr. Karlton Lemon. 11. Back/shoulder pain:  Advised against frequent NSAID use due to renal dysfunction and CHF and chronic anticoagulation. Consider evaluation by her Orthopedic specialist re: appropriateness of steroid injections.  COVID-19 Education: The signs and symptoms of COVID-19 were discussed with the patient and how to seek care for testing (follow up with PCP or arrange E-visit).  The importance of social distancing was discussed today.  Patient Risk:   After full review of this patients clinical status, I feel that they are at least moderate risk at this time.  Time:   Today, I have spent 30 minutes with the patient with telehealth technology discussing CHF, diuretics..     Medication Adjustments/Labs and Tests Ordered: Current medicines are reviewed at length with the patient today.  Concerns regarding medicines are outlined above.  Tests Ordered: No orders of the defined types were placed in this encounter.  Medication Changes: Meds ordered this encounter  Medications   furosemide (LASIX) 40 MG tablet    Sig: Take 2 tablets (80 mg total) by mouth daily.    Dispense:  180 tablet    Refill:  3    Disposition:  one month - video telemedicine  Signed, Sanda Klein, MD  03/18/2019 10:08 AM    [place]

## 2019-03-18 NOTE — Patient Instructions (Signed)
Medication Instructions:  Dr Sallyanne Kuster has recommended making the following medication changes: 1. INCREASE Furosemide to 80 mg daily  If you need a refill on your cardiac medications before your next appointment, please call your pharmacy.   Follow-Up: Your physician recommends that you schedule a follow-up virtual telehealth appointment in 1 month with either [facility] or Dr Sallyanne Kuster.

## 2019-04-07 ENCOUNTER — Other Ambulatory Visit: Payer: Self-pay

## 2019-04-07 ENCOUNTER — Ambulatory Visit (INDEPENDENT_AMBULATORY_CARE_PROVIDER_SITE_OTHER): Payer: Medicare Other | Admitting: *Deleted

## 2019-04-07 DIAGNOSIS — I495 Sick sinus syndrome: Secondary | ICD-10-CM | POA: Diagnosis not present

## 2019-04-07 LAB — CUP PACEART REMOTE DEVICE CHECK
Battery Remaining Longevity: 8 mo
Battery Remaining Percentage: 6 %
Battery Voltage: 2.68 V
Brady Statistic AP VP Percent: 1 %
Brady Statistic AP VS Percent: 91 %
Brady Statistic AS VP Percent: 1 %
Brady Statistic AS VS Percent: 8.2 %
Brady Statistic RA Percent Paced: 88 %
Brady Statistic RV Percent Paced: 1.4 %
Date Time Interrogation Session: 20200416062527
Implantable Lead Implant Date: 20100507
Implantable Lead Implant Date: 20100507
Implantable Lead Location: 753859
Implantable Lead Location: 753860
Implantable Pulse Generator Implant Date: 20100507
Lead Channel Impedance Value: 330 Ohm
Lead Channel Impedance Value: 510 Ohm
Lead Channel Pacing Threshold Amplitude: 0.75 V
Lead Channel Pacing Threshold Amplitude: 1.25 V
Lead Channel Pacing Threshold Pulse Width: 0.4 ms
Lead Channel Pacing Threshold Pulse Width: 0.4 ms
Lead Channel Sensing Intrinsic Amplitude: 12 mV
Lead Channel Sensing Intrinsic Amplitude: 4.9 mV
Lead Channel Setting Pacing Amplitude: 1.5 V
Lead Channel Setting Pacing Amplitude: 1.75 V
Lead Channel Setting Pacing Pulse Width: 0.4 ms
Lead Channel Setting Sensing Sensitivity: 4 mV
Pulse Gen Model: 2210
Pulse Gen Serial Number: 2261146

## 2019-04-13 ENCOUNTER — Encounter: Payer: Self-pay | Admitting: Cardiology

## 2019-04-13 NOTE — Progress Notes (Signed)
Remote pacemaker transmission.   

## 2019-04-14 ENCOUNTER — Telehealth: Payer: Self-pay

## 2019-04-14 NOTE — Telephone Encounter (Signed)
Spoke with the patient and she stated that she doesn't have access to internet or a smart phone. She has given verbal consent to file her insurance and to do a telephone visit.

## 2019-04-19 ENCOUNTER — Other Ambulatory Visit: Payer: Self-pay

## 2019-04-19 ENCOUNTER — Ambulatory Visit (INDEPENDENT_AMBULATORY_CARE_PROVIDER_SITE_OTHER): Payer: Medicare Other | Admitting: Neurology

## 2019-04-19 ENCOUNTER — Ambulatory Visit: Payer: Medicare Other | Admitting: Adult Health

## 2019-04-19 ENCOUNTER — Encounter: Payer: Self-pay | Admitting: Neurology

## 2019-04-19 DIAGNOSIS — Z8669 Personal history of other diseases of the nervous system and sense organs: Secondary | ICD-10-CM | POA: Diagnosis not present

## 2019-04-19 DIAGNOSIS — R569 Unspecified convulsions: Secondary | ICD-10-CM | POA: Diagnosis not present

## 2019-04-19 DIAGNOSIS — Z86011 Personal history of benign neoplasm of the brain: Secondary | ICD-10-CM

## 2019-04-19 NOTE — Progress Notes (Signed)
Virtual Visit via Telephone Note  I connected with [name] on 04/19/19 at  9:45 AM EDT by telephone and verified that I am speaking with the correct person using two identifiers.   I discussed the limitations, risks, security and privacy concerns of performing an evaluation and management service by telephone and the availability of in person appointments. I also discussed with the patient that there may be a patient responsible charge related to this service. The patient expressed understanding and agreed to proceed.   History of Present Illness: 04/19/2019 SS: Ms. Gaffin is an 85 year old female with a history of meningioma status post resection.  She has not had any seizure events.  She is currently taking Topamax 50 mg twice daily and tolerating medication well.  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 She is working closely with her eye doctor. She had a CT head scan in March 2019, showed no significant change compared with prior CT scan in 2017.  In the past she has had some trouble with memory, remembering names.  She did not want to switch anticonvulsant medications.  She reports difficulty remembering names. For the last month she has noticed some occasional pain to the top of the right side of her head, around her scalp, she reports it does not bother her that much, it is mild. She denies any other associated symptoms.  She denies any numbness or weakness in her arms or legs.  She reports she does walk with a cane when she goes out, she denies any falls.  She currently lives with her daughter.  She says overall she is doing quite well.  02/09/2018 MM: Ms. Uppal is an 85 year old female with a history of meningioma status post resection.  She returns today for follow-up.  Overall she states that she has done well.  She denies any seizure events.  She remains on Topamax and is tolerating it well.  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  She reports because of her vision sometimes she has trouble with depth perception.  She denies any falls.  Her daughter lives with her.  She is able to do most things independently.  She returns today for an evaluation.   Observations/Objective: Alert, answers questions appropriately, speech is clear, knowledgeable of health condition, this was a telephone call  Assessment and Plan: 1.  History of multiple meningiomas 2.  History of seizures, on Topamax 3.  Mild memory disturbance  Overall she says she is doing quite well.  She does report some occasional mild pain she has noticed within the last month to the top of the right side of her head, near her scalp.  This does not bother her very much, is not associated with any other symptoms.  We discussed the fact that she had a CT scan in March 2019, did not show any change.  We discussed possibly needing to obtain another head CT scan.  She reports she wants to wait at this time, says the pain is rather benign, not bothersome.  She is also taking Coumadin, needs to be careful not to fall, closely monitor her symptoms, let us know if there is any change.  She will continue taking Topamax 50 mg twice daily, she has not had any seizure events.  Follow Up Instructions: 4 months for in office visit   I discussed the assessment and treatment  plan with the patient. The patient was provided an opportunity to ask questions and all were answered. The patient agreed with the plan and demonstrated an understanding of the instructions.   The patient was advised to call back or seek an in-person evaluation if the symptoms worsen or if the condition fails to improve as anticipated.  I provided 22 minutes of non-face-to-face time during this encounter.   Evangeline Dakin, DNP  [facility] [address], [place], [place] [address] [phone]

## 2019-04-19 NOTE — Progress Notes (Signed)
I have read the note, and I agree with the clinical assessment and plan.  Charles K Willis   

## 2019-05-09 ENCOUNTER — Other Ambulatory Visit: Payer: Self-pay

## 2019-05-09 ENCOUNTER — Ambulatory Visit (INDEPENDENT_AMBULATORY_CARE_PROVIDER_SITE_OTHER): Payer: Medicare Other | Admitting: *Deleted

## 2019-05-09 DIAGNOSIS — I495 Sick sinus syndrome: Secondary | ICD-10-CM

## 2019-05-10 LAB — CUP PACEART REMOTE DEVICE CHECK
Date Time Interrogation Session: 20200519072732
Implantable Lead Implant Date: 20100507
Implantable Lead Implant Date: 20100507
Implantable Lead Location: 753859
Implantable Lead Location: 753860
Implantable Pulse Generator Implant Date: 20100507
Pulse Gen Model: 2210
Pulse Gen Serial Number: 2261146

## 2019-05-17 ENCOUNTER — Encounter: Payer: Self-pay | Admitting: Cardiology

## 2019-05-17 NOTE — Addendum Note (Signed)
Addended by: Tiajuana Amass on: 05/17/2019 04:32 PM   Modules accepted: Level of Service

## 2019-05-17 NOTE — Progress Notes (Signed)
Remote pacemaker transmission.   

## 2019-05-29 ENCOUNTER — Other Ambulatory Visit: Payer: Self-pay | Admitting: Cardiovascular Disease

## 2019-05-30 ENCOUNTER — Other Ambulatory Visit: Payer: Self-pay

## 2019-05-30 MED ORDER — VERAPAMIL HCL ER 240 MG PO CP24: 240.0000 mg | ORAL_CAPSULE | Freq: Every day | ORAL | 2 refills | Status: DC

## 2019-05-30 NOTE — Telephone Encounter (Signed)
Rx(s) sent to pharmacy electronically.  

## 2019-06-02 ENCOUNTER — Ambulatory Visit: Payer: Medicare Other | Admitting: Podiatry

## 2019-06-15 ENCOUNTER — Ambulatory Visit: Payer: Medicare Other | Admitting: Podiatry

## 2019-06-15 ENCOUNTER — Other Ambulatory Visit: Payer: Self-pay

## 2019-06-15 ENCOUNTER — Encounter: Payer: Self-pay | Admitting: Podiatry

## 2019-06-15 VITALS — Temp 97.6°F

## 2019-06-15 DIAGNOSIS — M79675 Pain in left toe(s): Secondary | ICD-10-CM | POA: Diagnosis not present

## 2019-06-15 DIAGNOSIS — M79674 Pain in right toe(s): Secondary | ICD-10-CM | POA: Diagnosis not present

## 2019-06-15 DIAGNOSIS — B351 Tinea unguium: Secondary | ICD-10-CM

## 2019-06-16 ENCOUNTER — Encounter: Payer: Medicare Other | Admitting: *Deleted

## 2019-06-17 ENCOUNTER — Telehealth: Payer: Self-pay

## 2019-06-17 NOTE — Telephone Encounter (Signed)
Spoke with patient to remind of missed remote transmission 

## 2019-06-21 ENCOUNTER — Ambulatory Visit: Payer: Medicare Other | Admitting: Podiatry

## 2019-06-24 ENCOUNTER — Encounter: Payer: Self-pay | Admitting: Cardiology

## 2019-06-24 NOTE — Progress Notes (Signed)
Subjective:  [name] presents to clinic today with cc of  painful, thick, discolored, elongated toenails 1-5 b/l that become tender and cannot cut because of thickness. Pain is aggravated when wearing enclosed shoe gear.  [name] Blade, MD is her PCP.  Her daughter is present during the visit.   [name] is concerned about swelling b/l feet. She does not wear compression hose stating they are difficult to get on. She does take diuretics for her edema.    Current Outpatient Medications:  .  allopurinol (ZYLOPRIM) 100 MG tablet, Take 100 mg by mouth daily., Disp: , Rfl:  .  Cholecalciferol (VITAMIN D3) 1000 units CAPS, Take 1 capsule by mouth daily., Disp: , Rfl:  .  clotrimazole-betamethasone (LOTRISONE) cream, clotrimazole-betamethasone 1 %-0.05 % topical cream, Disp: , Rfl:  .  Coenzyme Q10 (CO Q 10 PO), Take by mouth., Disp: , Rfl:  .  colchicine 0.6 MG tablet, Take 0.6 mg by mouth. Reported on 03/18/2016, Disp: , Rfl:  .  ferrous sulfate 325 (65 FE) MG tablet, TAKE 1 TABLET (325 MG TOTAL) BY MOUTH EVERY MONDAY, WEDNESDAY, AND FRIDAY., Disp: 90 tablet, Rfl: 2 .  furosemide (LASIX) 40 MG tablet, Take 2 tablets (80 mg total) by mouth daily., Disp: 180 tablet, Rfl: 3 .  ketorolac (TORADOL) 60 MG/2ML SOLN injection, ketorolac 60 mg/2 mL intramuscular solution  Inject 60 mg, Disp: , Rfl:  .  KLOR-CON 10 10 MEQ tablet, Take 2 tablets (20 mEq total) by mouth 3 (three) times daily., Disp: 540 tablet, Rfl: 3 .  meclizine (ANTIVERT) 25 MG tablet, Take 25 mg by mouth 3 (three) times daily as needed., Disp: , Rfl: 2 .  metoprolol succinate (TOPROL-XL) 25 MG 24 hr tablet, TAKE 1 TABLET BY MOUTH EVERY DAY FOLLOWING A MEAL, Disp: 90 tablet, Rfl: 0 .  Multiple Vitamins-Minerals (MULTIVITAMIN PO), Take by mouth daily., Disp: , Rfl:  .  nitrofurantoin (MACRODANTIN) 100 MG capsule, nitrofurantoin macrocrystal 100 mg capsule, Disp: , Rfl:  .  nitroGLYCERIN (NITROLINGUAL) 0.4 MG/SPRAY spray,  Place 3 sprays under the tongue every 5 (five) minutes x 3 doses as needed for chest pain., Disp: 12 g, Rfl: 3 .  omeprazole (PRILOSEC) 20 MG capsule, Take 20 mg by mouth daily., Disp: , Rfl:  .  potassium chloride (KLOR-CON M10) 10 MEQ tablet, K[place]-Con M10 mEq tablet,extended release, Disp: , Rfl:  .  pravastatin (PRAVACHOL) 20 MG tablet, Take 20 mg by mouth at bedtime., Disp: , Rfl:  .  tobramycin (TOBREX) 0.3 % ophthalmic solution, tobramycin 0.3 % eye drops, Disp: , Rfl:  .  topiramate (TOPAMAX) 50 MG tablet, Take 50 mg by mouth 2 (two) times daily., Disp: , Rfl:  .  torsemide (DEMADEX) 20 MG tablet, Take 20 mg by mouth 2 (two) times daily., Disp: , Rfl:  .  traMADol (ULTRAM) 50 MG tablet, Take 1 tablet (50 mg total) by mouth every 6 (six) hours as needed for moderate pain., Disp: 30 tablet, Rfl: 0 .  triamcinolone acetonide (KENALOG-40) 40 MG/ML injection, Kenalog 40 mg/mL suspension for injection  Inject 60 mg x 1, Disp: , Rfl:  .  verapamil (VERELAN PM) 240 MG 24 hr capsule, Take 1 capsule (240 mg total) by mouth at bedtime., Disp: 90 capsule, Rfl: 2 .  warfarin (COUMADIN) 4 MG tablet, Take 4 mg by mouth daily., Disp: , Rfl:    Allergies  Allergen Reactions  . Dilantin [Phenytoin Sodium Extended] Hives  . Phenobarbital Hives  Objective: Vitals:   06/15/19 1448  Temp: 97.6 F (36.4 C)    Physical Examination:  Vascular Examination: Capillary refill time <3 seconds x 10 digits.  Diminished DP/PT pulses b/l.  Digital hair absent b/l.  +1 edema noted BLE. No cellulitis noted. No pain on calf compression noted b/l. No warmth.  Skin temperature gradient WNL b/l.  Dermatological Examination: Skin with mild atrophy b/l feet.  No open wounds b/l.  No interdigital macerations noted b/l.  Elongated, thick, discolored brittle toenails with subungual debris and pain on dorsal palpation of nailbeds 1-5 b/l.  Musculoskeletal Examination: Muscle strength 5/5 to all  muscle groups b/l.  Hammertoe deformity left 2nd and 3rd digits.  No pain, crepitus or joint discomfort with active/passive ROM.  Neurological Examination: Sensation intact 5/5 b/l with 10 gram monofilament.  Assessment: Mycotic nail infection with pain 1-5 b/l  Plan: 1. Toenails 1-5 b/l were debrided in length and girth without iatrogenic laceration. 2. Discussed edema of BLE. Encouraged her to elevate her feet when at rest. 3. Continue soft, supportive shoe gear daily. 4. Report any pedal injuries to medical professional. 5. Follow up 9 weeks. 6. Patient/POA to call should there be a question/concern in there interim.

## 2019-07-04 NOTE — Telephone Encounter (Signed)
Follow up   Patient is checking to see if transmission received. Please call.

## 2019-07-04 NOTE — Telephone Encounter (Signed)
Transmission was not received. Pt states she will attempt to send another one tomorrow morning. Pt due for monthly battery check. Last transmission was received 06/09/19.

## 2019-07-05 ENCOUNTER — Telehealth: Payer: Self-pay

## 2019-07-05 NOTE — Telephone Encounter (Signed)
I called to f/u with the pt about sending a transmission with her home monitor. She states she hit the button once. I told her to make sure she press the button twice and it should run it course. I gave her my direct office number to call me in the morning.

## 2019-07-06 NOTE — Telephone Encounter (Signed)
Pt tried to send the transmission while I was on the phone. The 5 lights was blinking all at the same time. I asked her to unplug the monitor for 10 seconds and plug it back in. I gave her the number to [employer] support to get help with her monitor.

## 2019-07-07 NOTE — Telephone Encounter (Signed)
Pt spoke with [facility] and they told her we have her locked and she can not send a transmission unless it her scheduled date. They told her the next time she is supposed to send is 08-04-2019 and if we do not receive her transmission then to call them at [phone]

## 2019-07-07 NOTE — Telephone Encounter (Signed)
Follow up   Patient states that she would like a call about her device. Please call.

## 2019-07-11 ENCOUNTER — Ambulatory Visit (INDEPENDENT_AMBULATORY_CARE_PROVIDER_SITE_OTHER): Payer: Medicare Other | Admitting: *Deleted

## 2019-07-11 DIAGNOSIS — I495 Sick sinus syndrome: Secondary | ICD-10-CM | POA: Diagnosis not present

## 2019-07-11 LAB — CUP PACEART REMOTE DEVICE CHECK
Date Time Interrogation Session: 20200720183105
Implantable Lead Implant Date: 20100507
Implantable Lead Implant Date: 20100507
Implantable Lead Location: 753859
Implantable Lead Location: 753860
Implantable Pulse Generator Implant Date: 20100507
Pulse Gen Model: 2210
Pulse Gen Serial Number: 2261146

## 2019-07-11 NOTE — Telephone Encounter (Signed)
Tramsmission received 07/10/2019 @ 02:47. 0-3 months to ERI. Known.

## 2019-07-11 NOTE — Telephone Encounter (Signed)
Will address again after 08/04/19 if needed.  Legrand Como [address] [place], PA-C 07/11/2019 11:11 AM

## 2019-07-22 ENCOUNTER — Other Ambulatory Visit: Payer: Self-pay | Admitting: Internal Medicine

## 2019-07-22 DIAGNOSIS — M5416 Radiculopathy, lumbar region: Secondary | ICD-10-CM

## 2019-07-23 NOTE — Progress Notes (Signed)
PATIENT: [name] DOB: [birth year]  REASON FOR VISIT: follow up HISTORY FROM: patient  HISTORY OF PRESENT ILLNESS: Today 07/25/19  [name] is an 85 year old female with history of meningioma status post resection.  She has not had any seizure events.  She is currently taking Topamax 50 mg twice a day and is tolerating the medication well.  In the past, she has had some trouble with memory, remembering names, but she did not want to switch anticonvulsant medications.  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 She had a CT scan in March 2019, showed no change, when compared to 2017.  She remains on Coumadin. She has a pacemaker. Her memory score was 29/30 today. She lives with her daughter. She doesn't drive a car. She as low back pain. She doesn't like to wear hearing aids. She denies recent falls.  She presents today for follow-up accompanied by her daughter.  HISTORY 04/19/2019 SS: [name] is an 85 year old female with a history of meningioma status post resection.  She has not had any seizure events.  She is currently taking Topamax 50 mg twice daily and tolerating medication well.  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 She is working closely with her eye doctor. She had a CT head scan in March 2019, showed no significant change compared with prior CT scan in 2017.  In the past she has had some trouble with memory, remembering names.  She did not want to switch anticonvulsant medications.  She reports difficulty remembering names. For the last month she has noticed some occasional pain to the top of the right side of her head, around her scalp, she reports it does not bother her that much, it is mild. She denies any other associated symptoms.  She denies any numbness or weakness in her arms or legs.  She reports she does walk with a cane when she goes out, she denies any falls.   She  does use a cane when she goes out. She currently lives with her daughter.  She says overall she is doing quite well.  REVIEW OF SYSTEMS: Out of a complete 14 system review of symptoms, the patient complains only of the following symptoms, and all other reviewed systems are negative.  Seizures, low back pain  ALLERGIES: Allergies  Allergen Reactions   Dilantin [Phenytoin Sodium Extended] Hives   Phenobarbital Hives          HOME MEDICATIONS: Outpatient Medications Prior to Visit  Medication Sig Dispense Refill   allopurinol (ZYLOPRIM) 100 MG tablet Take 100 mg by mouth daily.     Cholecalciferol (VITAMIN D3) 1000 units CAPS Take 1 capsule by mouth daily.     clotrimazole-betamethasone (LOTRISONE) cream clotrimazole-betamethasone 1 %-0.05 % topical cream     Coenzyme Q10 (CO Q 10 PO) Take by mouth.     colchicine 0.6 MG tablet Take 0.6 mg by mouth. Reported on 03/18/2016     ferrous sulfate 325 (65 FE) MG tablet TAKE 1 TABLET (325 MG TOTAL) BY MOUTH EVERY MONDAY, WEDNESDAY, AND FRIDAY. 90 tablet 2   furosemide (LASIX) 40 MG tablet Take 2 tablets (80 mg total) by mouth daily. 180 tablet 3   ketorolac (TORADOL) 60 MG/2ML SOLN injection ketorolac 60 mg/2 mL intramuscular solution  Inject 60 mg     KLOR-CON 10 10 MEQ tablet Take 2 tablets (20 mEq total) by  mouth 3 (three) times daily. 540 tablet 3   meclizine (ANTIVERT) 25 MG tablet Take 25 mg by mouth 3 (three) times daily as needed.  2   metoprolol succinate (TOPROL-XL) 25 MG 24 hr tablet TAKE 1 TABLET BY MOUTH EVERY DAY FOLLOWING A MEAL 90 tablet 0   Multiple Vitamins-Minerals (MULTIVITAMIN PO) Take by mouth daily.     nitrofurantoin (MACRODANTIN) 100 MG capsule nitrofurantoin macrocrystal 100 mg capsule     nitroGLYCERIN (NITROLINGUAL) 0.4 MG/SPRAY spray Place 3 sprays under the tongue every 5 (five) minutes x 3 doses as needed for chest pain. 12 g 3   omeprazole (PRILOSEC) 20 MG capsule Take 20 mg by mouth daily.       potassium chloride (KLOR-CON M10) 10 MEQ tablet Klor-Con M10 mEq tablet,extended release     pravastatin (PRAVACHOL) 20 MG tablet Take 20 mg by mouth at bedtime.     tobramycin (TOBREX) 0.3 % ophthalmic solution tobramycin 0.3 % eye drops     topiramate (TOPAMAX) 50 MG tablet Take 50 mg by mouth 2 (two) times daily.     torsemide (DEMADEX) 20 MG tablet Take 20 mg by mouth 2 (two) times daily.     traMADol (ULTRAM) 50 MG tablet Take 1 tablet (50 mg total) by mouth every 6 (six) hours as needed for moderate pain. 30 tablet 0   triamcinolone acetonide (KENALOG-40) 40 MG/ML injection Kenalog 40 mg/mL suspension for injection  Inject 60 mg x 1     verapamil (VERELAN PM) 240 MG 24 hr capsule Take 1 capsule (240 mg total) by mouth at bedtime. 90 capsule 2   warfarin (COUMADIN) 4 MG tablet Take 4 mg by mouth daily.     No facility-administered medications prior to visit.     PAST MEDICAL HISTORY: Past Medical History:  Diagnosis Date   ACL (anterior cruciate ligament) rupture    Left   Acute MI ([place])    Arthritis    Back pain    Secondary to arthritis   Benign paroxysmal positional vertigo 03/18/2016   Brain tumor [facility])    On right side. Removed in 1989   Bruit    Duplex doppler 03/28/05 Mildly abnormal. *Right subclavian artery: Less than 50% diameter reduction.   Chest pain    2D Echo 04/21/08 EF = >55%. Moderate tricuspid regurgitation. Persantine Myovoew stress test 04/21/08 EF = 81%, no evidence of ischemia noted.   Chronic anticoagulation    PAF   Chronic edema    Mild LE edema   Chronic kidney disease    Mild, stage 2   Coronary artery disease    Coronary atherosclerosis    Minimal   Dyslipidemia    GERD (gastroesophageal reflux disease)    Hyperlipidemia    On pravastatin   Memory difficulty 07/13/2014   Meningioma (HCC)    Resection in 1998   Mild depression (HCC)    Ovarian cyst    Benign. Had a salpiingoophorectomy in 2005.   PAF  (paroxysmal atrial fibrillation) (HCC)    On coumadin. St. Jude PPM (serial [id number]) inserted 2010   Paroxysmal atrial flutter ([place])    Seizure ([place]) 2006   Severe obesity (HCC)    Sick sinus syndrome ([place])    Sinus node dysfunction (HCC)    S/P implantation of a dual-chamber permanent pacemaker in 2010   SOB (shortness of breath) on exertion    Class II. Catheterization 04/30/10 showed mild CAD with mildly elevated right heart pressures.    Tachycardia-bradycardia  syndrome ([place])    S/P implantation of dual-chamber permanent pacemaker in 2010   Trochlear nerve palsy    Right   Vertigo     PAST SURGICAL HISTORY: Past Surgical History:  Procedure Laterality Date   Brain tumor removal  1989   CARDIAC CATHETERIZATION  04/30/10   Showed mild CAD with mildly elevated right heart pressures. (Dr. Elisabeth Cara)   [place]   (Brain tumor) With gamma knife procedure as well.   KNEE SURGERY     Bilateral, arthroscopic   PACEMAKER INSERTION  04/27/09   Implanted by Dr. Elisabeth Cara. St. Jude [address] DR (serial [id number])    FAMILY HISTORY: Family History  Problem Relation Age of Onset   Stroke Mother    Heart disease Father    Heart disease Sister    Heart disease Brother    Heart disease Brother    Heart disease Brother    Heart disease Sister    Heart disease Sister     SOCIAL HISTORY: Social History   Socioeconomic History   Marital status: Divorced    Spouse name: Not on file   Number of children: 3   Years of education: Not on file   Highest education level: Not on file  Occupational History    Comment: Retired  Scientist, product/process development strain: Not on file   Food insecurity    Worry: Not on file    Inability: Not on Lexicographer needs    Medical: Not on file    Non-medical: Not on file  Tobacco Use   Smoking status: Former Smoker    Years: 20.00    Types: Cigarettes    Quit date: 12/23/2007    Years since quitting:  11.5   Smokeless tobacco: Never Used  Substance and Sexual Activity   Alcohol use: No    Alcohol/week: 0.0 standard drinks   Drug use: No   Sexual activity: Not on file  Lifestyle   Physical activity    Days per week: Not on file    Minutes per session: Not on file   Stress: Not on file  Relationships   Social connections    Talks on phone: Not on file    Gets together: Not on file    Attends religious service: Not on file    Active member of club or organization: Not on file    Attends meetings of clubs or organizations: Not on file    Relationship status: Not on file   Intimate partner violence    Fear of current or ex partner: Not on file    Emotionally abused: Not on file    Physically abused: Not on file    Forced sexual activity: Not on file  Other Topics Concern   Not on file  Social History Narrative   Not on file   PHYSICAL EXAM  There were no vitals filed for this visit. There is no height or weight on file to calculate BMI.  Generalized: Well developed, in no acute distress  MMSE - Mini Mental State Exam 07/25/2019 02/03/2017 07/30/2016  Orientation to time 4 4 5   Orientation to Place 5 4 5   Registration 3 3 3   Attention/ Calculation 5 5 5   Recall 3 3 3   Language- name 2 objects 2 2 2   Language- repeat 1 1 1   Language- follow 3 step command 3 3 3   Language- read & follow direction 1 1 1   Write a sentence 1  1 1  Copy design 1 1 1   Copy design-comments named 9 animals - -  Total score 29 28 30     Neurological examination  Mentation: Alert oriented to time, place, history taking. Follows all commands speech and language fluent Cranial nerve II-XII: Pupils were equal round reactive to light. Extraocular movements were full, visual field were full on confrontational test. Facial sensation and strength were normal. Head turning and shoulder shrug  were normal and symmetric. Motor: The motor testing reveals 5 over 5 strength of all 4 extremities. Good  symmetric motor tone is noted throughout.  Sensory: Sensory testing is intact to soft touch on all 4 extremities. No evidence of extinction is noted.  Coordination: Cerebellar testing reveals good finger-nose-finger and heel-to-shin bilaterally.  Gait and station: Gait is mildly unsteady, using a cane. Has to stand from seated position slowly, needs to push off.  Reflexes: Deep tendon reflexes are symmetric and normal bilaterally.   DIAGNOSTIC DATA (LABS, IMAGING, TESTING) - I reviewed patient records, labs, notes, testing and imaging myself where available.  Lab Results  Component Value Date   WBC 7.1 06/06/2014   HGB 11.0 (L) 06/06/2014   HCT 35.2 (L) 06/06/2014   MCV 71.7 (L) 06/06/2014   PLT 225 06/06/2014      Component Value Date/Time   NA 144 12/13/2018 1137   K 4.2 12/13/2018 1137   CL 101 12/13/2018 1137   CO2 22 12/13/2018 1137   GLUCOSE 56 (L) 12/13/2018 1137   GLUCOSE 134 (H) 02/07/2016 1051   BUN 32 (H) 12/13/2018 1137   CREATININE 1.47 (H) 12/13/2018 1137   CREATININE 1.54 (H) 02/07/2016 1051   CALCIUM 10.0 12/13/2018 1137   PROT 7.0 04/23/2009 1930   ALBUMIN 3.2 (L) 04/23/2009 1930   AST 19 04/23/2009 1930   ALT 12 04/23/2009 1930   ALKPHOS 71 04/23/2009 1930   BILITOT 0.6 04/23/2009 1930   GFRNONAA 33 (L) 12/13/2018 1137   GFRAA 38 (L) 12/13/2018 1137   No results found for: CHOL, HDL, LDLCALC, LDLDIRECT, TRIG, CHOLHDL No results found for: HGBA1C No results found for: [place] PLAN 85 y.o. year old female  has a past medical history of ACL (anterior cruciate ligament) rupture, Acute MI ([place]), Arthritis, Back pain, Benign paroxysmal positional vertigo (03/18/2016), Brain tumor ([place]), Bruit, Chest pain, Chronic anticoagulation, Chronic edema, Chronic kidney disease, Coronary artery disease, Coronary atherosclerosis, Dyslipidemia, GERD (gastroesophageal reflux disease), Hyperlipidemia, Memory difficulty (07/13/2014), Meningioma ([place]), Mild  depression ([place]), Ovarian cyst, PAF (paroxysmal atrial fibrillation) ([place]), Paroxysmal atrial flutter ([place]), Seizure ([place]) (2006), Severe obesity ([place]), Sick sinus syndrome ([place]), Sinus node dysfunction ([place]), SOB (shortness of breath) on exertion, Tachycardia-bradycardia syndrome ([place]), Trochlear nerve palsy, and Vertigo. here with:  1. History of multiple meningiomas 2. History of seizure, on Topamax 3. Mild memory disturbance   She has continued to do quite well. She has not had recurrent seizure. She will continue Topamax 50 mg twice daily.  She does report some side effect of mental fogginess, but has not wanted to change medication in the past.  She last had CT scan in March 2019, did not show any change from prior. We may consider repeat scan in 2021. She remains on Coumadin.  She has a pacemaker. Her memory score is stable, was 29/30 today. She will follow-up in 6 to 8 months or sooner if needed.  Her primary care doctor is filling her Topamax.  I advised her that if her symptoms worsen or  she develops any new symptoms she should let us know.   I spent 15 minutes with the patient. 50% of this time was spent discussing her plan of care    Butler Denmark, AGNP-C, DNP 07/25/2019, 8:21 AM [facility] [address] [place], [place] [address] [phone]

## 2019-07-25 ENCOUNTER — Ambulatory Visit (INDEPENDENT_AMBULATORY_CARE_PROVIDER_SITE_OTHER): Payer: Medicare Other | Admitting: Neurology

## 2019-07-25 ENCOUNTER — Other Ambulatory Visit: Payer: Self-pay

## 2019-07-25 ENCOUNTER — Encounter: Payer: Self-pay | Admitting: Neurology

## 2019-07-25 ENCOUNTER — Encounter: Payer: Self-pay | Admitting: Cardiology

## 2019-07-25 VITALS — BP 120/60 | HR 61 | Temp 97.8°F | Ht 64.0 in | Wt 218.2 lb

## 2019-07-25 DIAGNOSIS — R569 Unspecified convulsions: Secondary | ICD-10-CM | POA: Diagnosis not present

## 2019-07-25 DIAGNOSIS — D429 Neoplasm of uncertain behavior of meninges, unspecified: Secondary | ICD-10-CM

## 2019-07-25 DIAGNOSIS — R413 Other amnesia: Secondary | ICD-10-CM | POA: Diagnosis not present

## 2019-07-25 NOTE — Progress Notes (Signed)
I have read the note, and I agree with the clinical assessment and plan.  Charles K Willis   

## 2019-07-25 NOTE — Patient Instructions (Signed)
It was a pleasure to meet you both today! Please continue taking Topamax 50 mg twice daily. Please call for recurrent seizure.

## 2019-07-25 NOTE — Progress Notes (Signed)
Remote pacemaker transmission.   

## 2019-08-04 ENCOUNTER — Ambulatory Visit (INDEPENDENT_AMBULATORY_CARE_PROVIDER_SITE_OTHER): Payer: Medicare Other | Admitting: *Deleted

## 2019-08-04 DIAGNOSIS — I495 Sick sinus syndrome: Secondary | ICD-10-CM

## 2019-08-04 LAB — CUP PACEART REMOTE DEVICE CHECK
Battery Remaining Longevity: 1 mo
Battery Remaining Percentage: 0.5 %
Battery Voltage: 2.6 V
Brady Statistic AP VP Percent: 1 %
Brady Statistic AP VS Percent: 93 %
Brady Statistic AS VP Percent: 1 %
Brady Statistic AS VS Percent: 6.4 %
Brady Statistic RA Percent Paced: 88 %
Brady Statistic RV Percent Paced: 2.5 %
Date Time Interrogation Session: 20200813071121
Implantable Lead Implant Date: 20100507
Implantable Lead Implant Date: 20100507
Implantable Lead Location: 753859
Implantable Lead Location: 753860
Implantable Pulse Generator Implant Date: 20100507
Lead Channel Impedance Value: 300 Ohm
Lead Channel Impedance Value: 560 Ohm
Lead Channel Pacing Threshold Amplitude: 0.625 V
Lead Channel Pacing Threshold Amplitude: 1.125 V
Lead Channel Pacing Threshold Pulse Width: 0.4 ms
Lead Channel Pacing Threshold Pulse Width: 0.4 ms
Lead Channel Sensing Intrinsic Amplitude: 12 mV
Lead Channel Sensing Intrinsic Amplitude: 5 mV
Lead Channel Setting Pacing Amplitude: 1.375
Lead Channel Setting Pacing Amplitude: 1.625
Lead Channel Setting Pacing Pulse Width: 0.4 ms
Lead Channel Setting Sensing Sensitivity: 4 mV
Pulse Gen Model: 2210
Pulse Gen Serial Number: 2261146

## 2019-08-12 ENCOUNTER — Telehealth: Payer: Self-pay | Admitting: *Deleted

## 2019-08-12 DIAGNOSIS — Z01818 Encounter for other preprocedural examination: Secondary | ICD-10-CM

## 2019-08-12 DIAGNOSIS — I495 Sick sinus syndrome: Secondary | ICD-10-CM

## 2019-08-12 NOTE — Telephone Encounter (Signed)
Patient made aware of results and verbalized understanding.  The procedure has been scheduled for 9/21 at 1pm. The patient has been advised that we will back at a later date for more instructions.

## 2019-08-12 NOTE — Telephone Encounter (Signed)
-----   Message from Sanda Klein, MD sent at 08/10/2019  4:19 PM EDT ----- Remote reviewed.   Not pacemaker dependent. Battery status is good. Lead measurements are stable. Heart rate histogram is favorable. No clinically significant episodes of high ventricular rate or atrial mode switch noted.  Very close to ERI/need for generator change. I would tentatively place on schedule for September 21 generator changeout.

## 2019-08-13 ENCOUNTER — Other Ambulatory Visit: Payer: Medicare Other

## 2019-08-15 ENCOUNTER — Encounter: Payer: Self-pay | Admitting: Cardiology

## 2019-08-15 NOTE — Progress Notes (Signed)
Remote pacemaker transmission.   

## 2019-08-17 ENCOUNTER — Other Ambulatory Visit: Payer: Self-pay

## 2019-08-17 ENCOUNTER — Ambulatory Visit: Payer: Medicare Other | Admitting: Podiatry

## 2019-08-17 ENCOUNTER — Encounter: Payer: Self-pay | Admitting: Podiatry

## 2019-08-17 DIAGNOSIS — B351 Tinea unguium: Secondary | ICD-10-CM | POA: Diagnosis not present

## 2019-08-17 DIAGNOSIS — Z9229 Personal history of other drug therapy: Secondary | ICD-10-CM

## 2019-08-17 DIAGNOSIS — M79675 Pain in left toe(s): Secondary | ICD-10-CM | POA: Diagnosis not present

## 2019-08-17 DIAGNOSIS — M79674 Pain in right toe(s): Secondary | ICD-10-CM

## 2019-08-17 NOTE — Patient Instructions (Signed)

## 2019-08-20 ENCOUNTER — Other Ambulatory Visit: Payer: Self-pay | Admitting: Cardiovascular Disease

## 2019-08-24 ENCOUNTER — Other Ambulatory Visit: Payer: Self-pay

## 2019-08-24 ENCOUNTER — Other Ambulatory Visit: Payer: Self-pay | Admitting: Internal Medicine

## 2019-08-24 ENCOUNTER — Ambulatory Visit
Admission: RE | Admit: 2019-08-24 | Discharge: 2019-08-24 | Disposition: A | Discharge status: Home / Self Care | Payer: Medicare Other | Source: Ambulatory Visit | Attending: Internal Medicine | Admitting: Internal Medicine

## 2019-08-24 DIAGNOSIS — M5440 Lumbago with sciatica, unspecified side: Secondary | ICD-10-CM

## 2019-08-25 NOTE — Progress Notes (Signed)
Subjective: [name] is a 85 y.o. y.o. female who is on long term blood thinner Coumadin presents today with painful, discolored, thick toenails  which interfere with daily activities. Pain is aggravated when wearing enclosed shoe gear. Pain is relieved with periodic professional debridement.  Patient's PCP is Willey Blade, MD.   Current Outpatient Medications:  .  allopurinol (ZYLOPRIM) 100 MG tablet, Take 100 mg by mouth daily., Disp: , Rfl:  .  ASPIRIN 81 PO, Adult Low Dose Aspirin 81 mg tablet,delayed release  Take 1 tablet every day by oral route., Disp: , Rfl:  .  Cholecalciferol (VITAMIN D3) 1000 units CAPS, Take 1 capsule by mouth daily., Disp: , Rfl:  .  clotrimazole-betamethasone (LOTRISONE) cream, clotrimazole-betamethasone 1 %-0.05 % topical cream, Disp: , Rfl:  .  Coenzyme Q10 (CO Q 10 PO), Take by mouth., Disp: , Rfl:  .  colchicine 0.6 MG tablet, Take 0.6 mg by mouth. Reported on 03/18/2016, Disp: , Rfl:  .  ferrous sulfate 325 (65 FE) MG tablet, TAKE 1 TABLET (325 MG TOTAL) BY MOUTH EVERY MONDAY, WEDNESDAY, AND FRIDAY., Disp: 90 tablet, Rfl: 2 .  furosemide (LASIX) 40 MG tablet, Take 2 tablets (80 mg total) by mouth daily., Disp: 180 tablet, Rfl: 3 .  ketorolac (TORADOL) 60 MG/2ML SOLN injection, ketorolac 60 mg/2 mL intramuscular solution  Inject 60 mg, Disp: , Rfl:  .  KLOR-CON 10 10 MEQ tablet, Take 2 tablets (20 mEq total) by mouth 3 (three) times daily., Disp: 540 tablet, Rfl: 3 .  meclizine (ANTIVERT) 25 MG tablet, Take 25 mg by mouth 3 (three) times daily as needed., Disp: , Rfl: 2 .  metoprolol succinate (TOPROL-XL) 25 MG 24 hr tablet, Take 1 tablet (25 mg total) by mouth daily., Disp: 30 tablet, Rfl: 0 .  Multiple Vitamins-Minerals (MULTIVITAMIN PO), Take by mouth daily., Disp: , Rfl:  .  nitrofurantoin (MACRODANTIN) 100 MG capsule, nitrofurantoin macrocrystal 100 mg capsule, Disp: , Rfl:  .  nitroGLYCERIN (NITROLINGUAL) 0.4 MG/SPRAY spray, Place 3 sprays  under the tongue every 5 (five) minutes x 3 doses as needed for chest pain., Disp: 12 g, Rfl: 3 .  omeprazole (PRILOSEC) 20 MG capsule, Take 20 mg by mouth daily., Disp: , Rfl:  .  potassium chloride (KLOR-CON M10) 10 MEQ tablet, Klor-Con M10 mEq tablet,extended release, Disp: , Rfl:  .  pravastatin (PRAVACHOL) 20 MG tablet, Take 20 mg by mouth at bedtime., Disp: , Rfl:  .  tobramycin (TOBREX) 0.3 % ophthalmic solution, tobramycin 0.3 % eye drops, Disp: , Rfl:  .  topiramate (TOPAMAX) 50 MG tablet, Take 50 mg by mouth 2 (two) times daily., Disp: , Rfl:  .  torsemide (DEMADEX) 20 MG tablet, Take 20 mg by mouth 2 (two) times daily., Disp: , Rfl:  .  traMADol (ULTRAM) 50 MG tablet, Take 1 tablet (50 mg total) by mouth every 6 (six) hours as needed for moderate pain., Disp: 30 tablet, Rfl: 0 .  triamcinolone acetonide (KENALOG-40) 40 MG/ML injection, Kenalog 40 mg/mL suspension for injection  Inject 60 mg x 1, Disp: , Rfl:  .  verapamil (VERELAN PM) 240 MG 24 hr capsule, Take 1 capsule (240 mg total) by mouth at bedtime., Disp: 90 capsule, Rfl: 2 .  warfarin (COUMADIN) 4 MG tablet, Take 4 mg by mouth daily., Disp: , Rfl:    Allergies  Allergen Reactions  . Dilantin [Phenytoin Sodium Extended] Hives  . Phenobarbital Hives  Objective:  Vascular Examination: Capillary refill time <3 seconds x 10 digits.  Dorsalis pedis pulses and Posterior tibial pulses diminished b/l.  No digital hair x 10 digits.  Skin temperature gradient WNL b/l.  Dermatological Examination: Skin with mild atrophy b/l.   Toenails 1-5 b/l discolored, thick, dystrophic with subungual debris and pain with palpation to nailbeds due to thickness of nails.  Musculoskeletal: Muscle strength 5/5 to all LE muscle groups.  Hammertoes left 2nd and 3rd digits.  Neurological: Sensation intact  5/5 b/l with 10 gram monofilament.   Assessment: Painful onychomycosis toenails 1-5 b/l in patient on blood thinner.    Plan: 1. Toenails 1-5 b/l were debrided in length and girth without iatrogenic bleeding. 2. Patient to continue soft, supportive shoe gear daily. 3. Patient to report any pedal injuries to medical professional immediately. 4. Avoid self trimming due to use of blood thinner. 5. Follow up 9 weeks.  6. Patient/POA to call should there be a concern in the interim.

## 2019-09-01 ENCOUNTER — Other Ambulatory Visit: Payer: Self-pay | Admitting: *Deleted

## 2019-09-01 DIAGNOSIS — I495 Sick sinus syndrome: Secondary | ICD-10-CM

## 2019-09-02 ENCOUNTER — Encounter: Payer: Self-pay | Admitting: *Deleted

## 2019-09-02 NOTE — Telephone Encounter (Signed)
The patient has been made aware of the instructions. The surgical scrub has been left at the front for her. Instructions have been mailed.     [facility], [address]  [place], [place] [address]  Phone: [phone] Fax: [phone]    Generator Change Procedure Instructions  You are scheduled for a Generator Change (battery change) on  09/12/2019  with Dr. Dr. Sallyanne Kuster.  1. Please arrive at the [facility], Entrance "A"  at [facility] at  11am on the day of your procedure. (The address is [address]  2. DIET: Do not eat or drink after midnight the night before your procedure.  3. LABS: Your provider would like for you to return on 9/14 to have the following labs drawn: BMET, PT/INR, CBC. You do not need an appointment for the lab. Once in our office lobby there is a podium where you can sign in and ring the doorbell to alert [name] that you are here. The lab is open from 8:00 am to 4:30 pm; closed for lunch from 12:45pm-1:45pm.  You will need to have the coronavirus test completed prior to your procedure. An appointment has been made at 12 pm on 9/17. This is a Drive Up Visit at the [facility] [address] Please tell them that you are there for pre procedure testing. Someone will direct you to the appropriate testing line. Stay in your car and someone will be with you shortly. Please make sure to have all other labs completed before this test because you will need to stay quarantined until your procedure.   4. MEDICATIONS: Hold your diuretic and potassium the morning of the procedure.  5.  Plan for an overnight stay.  Bring your insurance cards and a list of you medications.  6.  Wash your chest and neck with surgical scrub the evening before and the morning of your procedure.  Rinse well. Please review the surgical scrub instruction sheet given to you.   7. Your chest will need to be shaved prior  to this procedure (if needed). We ask that you do this yourself at home 1 to 2 days before or if uncomfortable/unable to do yourself, then it will be performed by the hospital staff the day of.  * Special note:  Every effort is made to have your procedure done on time.  Occasionally there are emergencies that present themselves at the hospital that may cause delays.  Please be patient if a delay does occur.                                                                                                           * If you have any questions after you get home, please call Lattie Haw, RN at [phone].    [place] - Preparing For Surgery  Before surgery, you can play an important role. Because skin is not sterile, your skin needs to be as free of germs as possible. You can reduce the  number of germs on your skin by washing with CHG (chlorahexidine gluconate) Soap before surgery.  CHG is an antiseptic cleaner which kills germs and bonds with the skin to continue killing germs even after washing.   Please do not use if you have an allergy to CHG or antibacterial soaps.  If your skin becomes reddened/irritated stop using the CHG.   Do not shave (including legs and underarms) for at least 48 hours prior to first CHG shower.  It is OK to shave your face.  Please follow these instructions carefully:  1.  Shower the night before surgery and the morning of surgery with CHG.  2.  If you choose to wash your hair, wash your hair first as usual with your normal shampoo.  3.  After you shampoo, rinse your hair and body thoroughly to remove the shampoo.  4.  Use CHG as you would any other liquid soap.  You can apply CHG directly to the skin and wash gently with a clean washcloth. 5.  Apply the CHG Soap to your body ONLY FROM THE NECK DOWN.  Do not use on open wounds or open sores.  Avoid contact with your eyes, ears, mouth and genitals (private parts).  Wash genitals (private parts) with your normal soap.  6.   Wash thoroughly, paying special attention to the area where your surgery will be performed.  7.  Thoroughly rise your body with warm water from the neck down.   8.  DO NOT shower/wash with your normal soap after using and rinsing off the CHG soap.  9.  Pat yourself dry with a clean towel.           10.  Wear clean pajamas.           11.  Place clean sheets on your bed the night of your first shower and do not sleep with pets.  Day of Surgery: Do not apply any deodorants/lotions.  Please wear clean clothes to the hospital/surgery center.

## 2019-09-03 ENCOUNTER — Other Ambulatory Visit: Payer: Self-pay | Admitting: Cardiovascular Disease

## 2019-09-06 ENCOUNTER — Ambulatory Visit (INDEPENDENT_AMBULATORY_CARE_PROVIDER_SITE_OTHER): Payer: Medicare Other | Admitting: *Deleted

## 2019-09-06 DIAGNOSIS — I495 Sick sinus syndrome: Secondary | ICD-10-CM

## 2019-09-07 LAB — CBC
Hematocrit: 42.7 % (ref 34.0–46.6)
Hemoglobin: 14.1 g/dL (ref 11.1–15.9)
MCH: 28.5 pg (ref 26.6–33.0)
MCHC: 33 g/dL (ref 31.5–35.7)
MCV: 86 fL (ref 79–97)
Platelets: 200 10*3/uL (ref 150–450)
RBC: 4.95 x10E6/uL (ref 3.77–5.28)
RDW: 14.4 % (ref 11.7–15.4)
WBC: 5.2 10*3/uL (ref 3.4–10.8)

## 2019-09-07 LAB — PROTIME-INR
INR: 2.2 — ABNORMAL HIGH (ref 0.8–1.2)
Prothrombin Time: 22.4 s — ABNORMAL HIGH (ref 9.1–12.0)

## 2019-09-07 LAB — BASIC METABOLIC PANEL
BUN/Creatinine Ratio: 24 (ref 12–28)
BUN: 30 mg/dL — ABNORMAL HIGH (ref 8–27)
CO2: 17 mmol/L — ABNORMAL LOW (ref 20–29)
Calcium: 9.5 mg/dL (ref 8.7–10.3)
Chloride: 111 mmol/L — ABNORMAL HIGH (ref 96–106)
Creatinine, Ser: 1.23 mg/dL — ABNORMAL HIGH (ref 0.57–1.00)
GFR calc Af Amer: 47 mL/min/{1.73_m2} — ABNORMAL LOW (ref >59)
GFR calc non Af Amer: 40 mL/min/{1.73_m2} — ABNORMAL LOW (ref >59)
Glucose: 89 mg/dL (ref 65–99)
Potassium: 4 mmol/L (ref 3.5–5.2)
Sodium: 145 mmol/L — ABNORMAL HIGH (ref 134–144)

## 2019-09-08 ENCOUNTER — Other Ambulatory Visit (HOSPITAL_COMMUNITY)
Admission: RE | Admit: 2019-09-08 | Discharge: 2019-09-08 | Disposition: A | Discharge status: Home / Self Care | Payer: Medicare Other | Source: Ambulatory Visit | Attending: Cardiovascular Disease | Admitting: Cardiovascular Disease

## 2019-09-08 ENCOUNTER — Other Ambulatory Visit (HOSPITAL_COMMUNITY): Payer: Medicare Other

## 2019-09-08 DIAGNOSIS — Z20828 Contact with and (suspected) exposure to other viral communicable diseases: Secondary | ICD-10-CM | POA: Insufficient documentation

## 2019-09-08 DIAGNOSIS — Z01812 Encounter for preprocedural laboratory examination: Secondary | ICD-10-CM | POA: Diagnosis not present

## 2019-09-09 LAB — NOVEL CORONAVIRUS, NAA (HOSP ORDER, SEND-OUT TO REF LAB; TAT 18-24 HRS): SARS-CoV-2, NAA: NOT DETECTED

## 2019-09-12 ENCOUNTER — Ambulatory Visit (HOSPITAL_COMMUNITY)
Admission: RE | Admit: 2019-09-12 | Discharge: 2019-09-12 | Disposition: A | Discharge status: Home / Self Care | Payer: Medicare Other | Source: Ambulatory Visit | Attending: Cardiovascular Disease | Admitting: Cardiovascular Disease

## 2019-09-12 ENCOUNTER — Other Ambulatory Visit: Payer: Self-pay

## 2019-09-12 ENCOUNTER — Encounter (HOSPITAL_COMMUNITY)
Admission: RE | Disposition: A | Discharge status: Home / Self Care | Payer: Medicare Other | Source: Ambulatory Visit | Attending: Cardiovascular Disease

## 2019-09-12 DIAGNOSIS — Z6841 Body Mass Index (BMI) 40.0 and over, adult: Secondary | ICD-10-CM | POA: Insufficient documentation

## 2019-09-12 DIAGNOSIS — Z87891 Personal history of nicotine dependence: Secondary | ICD-10-CM | POA: Insufficient documentation

## 2019-09-12 DIAGNOSIS — I480 Paroxysmal atrial fibrillation: Secondary | ICD-10-CM | POA: Insufficient documentation

## 2019-09-12 DIAGNOSIS — Z45010 Encounter for checking and testing of cardiac pacemaker pulse generator [battery]: Secondary | ICD-10-CM

## 2019-09-12 DIAGNOSIS — I129 Hypertensive chronic kidney disease with stage 1 through stage 4 chronic kidney disease, or unspecified chronic kidney disease: Secondary | ICD-10-CM | POA: Diagnosis not present

## 2019-09-12 DIAGNOSIS — K219 Gastro-esophageal reflux disease without esophagitis: Secondary | ICD-10-CM | POA: Insufficient documentation

## 2019-09-12 DIAGNOSIS — Z8249 Family history of ischemic heart disease and other diseases of the circulatory system: Secondary | ICD-10-CM | POA: Diagnosis not present

## 2019-09-12 DIAGNOSIS — E785 Hyperlipidemia, unspecified: Secondary | ICD-10-CM | POA: Insufficient documentation

## 2019-09-12 DIAGNOSIS — I252 Old myocardial infarction: Secondary | ICD-10-CM | POA: Diagnosis not present

## 2019-09-12 DIAGNOSIS — I2510 Atherosclerotic heart disease of native coronary artery without angina pectoris: Secondary | ICD-10-CM | POA: Diagnosis not present

## 2019-09-12 DIAGNOSIS — Z86011 Personal history of benign neoplasm of the brain: Secondary | ICD-10-CM | POA: Insufficient documentation

## 2019-09-12 DIAGNOSIS — Z79899 Other long term (current) drug therapy: Secondary | ICD-10-CM | POA: Diagnosis not present

## 2019-09-12 DIAGNOSIS — I495 Sick sinus syndrome: Secondary | ICD-10-CM | POA: Diagnosis not present

## 2019-09-12 DIAGNOSIS — Z7901 Long term (current) use of anticoagulants: Secondary | ICD-10-CM | POA: Diagnosis not present

## 2019-09-12 DIAGNOSIS — N183 Chronic kidney disease, stage 3 (moderate): Secondary | ICD-10-CM | POA: Insufficient documentation

## 2019-09-12 DIAGNOSIS — M109 Gout, unspecified: Secondary | ICD-10-CM | POA: Insufficient documentation

## 2019-09-12 DIAGNOSIS — M1990 Unspecified osteoarthritis, unspecified site: Secondary | ICD-10-CM | POA: Diagnosis not present

## 2019-09-12 HISTORY — PX: PPM GENERATOR CHANGEOUT: EP1233

## 2019-09-12 LAB — PROTIME-INR
INR: 2.3 — ABNORMAL HIGH (ref 0.8–1.2)
Prothrombin Time: 24.5 seconds — ABNORMAL HIGH (ref 11.4–15.2)

## 2019-09-12 LAB — SURGICAL PCR SCREEN
MRSA, PCR: NEGATIVE
Staphylococcus aureus: NEGATIVE

## 2019-09-12 SURGERY — PPM GENERATOR CHANGEOUT

## 2019-09-12 MED ORDER — LIDOCAINE HCL (PF) 1 % IJ SOLN
INTRAMUSCULAR | Status: AC
  Filled 2019-09-12: qty 60

## 2019-09-12 MED ORDER — CEFAZOLIN SODIUM-DEXTROSE 2-4 GM/100ML-% IV SOLN
INTRAVENOUS | Status: AC
  Filled 2019-09-12: qty 100

## 2019-09-12 MED ORDER — SODIUM CHLORIDE 0.9 % IV SOLN: 250.0000 mL | INTRAVENOUS | Status: DC | PRN

## 2019-09-12 MED ORDER — ACETAMINOPHEN 325 MG PO TABS: 325.0000 mg | ORAL_TABLET | ORAL | Status: DC | PRN

## 2019-09-12 MED ORDER — LIDOCAINE HCL (PF) 1 % IJ SOLN
INTRAMUSCULAR | Status: DC | PRN
  Administered 2019-09-12: 30 mL

## 2019-09-12 MED ORDER — FENTANYL CITRATE (PF) 100 MCG/2ML IJ SOLN
INTRAMUSCULAR | Status: AC
  Filled 2019-09-12: qty 2

## 2019-09-12 MED ORDER — SODIUM CHLORIDE 0.9% FLUSH: 3.0000 mL | INTRAVENOUS | Status: DC | PRN

## 2019-09-12 MED ORDER — SODIUM CHLORIDE 0.9 % IV SOLN
INTRAVENOUS | Status: DC
  Administered 2019-09-12: 12:00:00 via INTRAVENOUS

## 2019-09-12 MED ORDER — SODIUM CHLORIDE 0.9 % IV SOLN
80.0000 mg | INTRAVENOUS | Status: AC
  Administered 2019-09-12: 80 mg
  Filled 2019-09-12: qty 2

## 2019-09-12 MED ORDER — ONDANSETRON HCL 4 MG/2ML IJ SOLN: 4.0000 mg | Freq: Four times a day (QID) | INTRAMUSCULAR | Status: DC | PRN

## 2019-09-12 MED ORDER — SODIUM CHLORIDE 0.9% FLUSH: 3.0000 mL | Freq: Two times a day (BID) | INTRAVENOUS | Status: DC

## 2019-09-12 MED ORDER — MIDAZOLAM HCL 5 MG/5ML IJ SOLN
INTRAMUSCULAR | Status: DC | PRN
  Administered 2019-09-12 (×2): 1 mg via INTRAVENOUS

## 2019-09-12 MED ORDER — CEFAZOLIN SODIUM-DEXTROSE 2-4 GM/100ML-% IV SOLN
2.0000 g | INTRAVENOUS | Status: AC
  Administered 2019-09-12: 2 g via INTRAVENOUS
  Filled 2019-09-12: qty 100

## 2019-09-12 MED ORDER — MUPIROCIN 2 % EX OINT
TOPICAL_OINTMENT | CUTANEOUS | Status: AC
  Filled 2019-09-12: qty 22

## 2019-09-12 MED ORDER — MIDAZOLAM HCL 5 MG/5ML IJ SOLN
INTRAMUSCULAR | Status: AC
  Filled 2019-09-12: qty 5

## 2019-09-12 MED ORDER — CHLORHEXIDINE GLUCONATE 4 % EX LIQD
60.0000 mL | Freq: Once | CUTANEOUS | Status: DC
  Filled 2019-09-12: qty 60

## 2019-09-12 MED ORDER — MUPIROCIN 2 % EX OINT
1.0000 "application " | TOPICAL_OINTMENT | Freq: Once | CUTANEOUS | Status: AC
  Administered 2019-09-12: 1 via TOPICAL
  Filled 2019-09-12: qty 22

## 2019-09-12 MED ORDER — FENTANYL CITRATE (PF) 100 MCG/2ML IJ SOLN
INTRAMUSCULAR | Status: DC | PRN
  Administered 2019-09-12: 25 ug via INTRAVENOUS

## 2019-09-12 MED ORDER — SODIUM CHLORIDE 0.9 % IV SOLN
INTRAVENOUS | Status: AC
  Filled 2019-09-12: qty 2

## 2019-09-12 SURGICAL SUPPLY — 4 items
CABLE SURGICAL S-101-97-12 (CABLE) ×3 IMPLANT
PACEMAKER ASSURITY DR-RF (Pacemaker) ×2 IMPLANT
PAD PRO RADIOLUCENT 2001M-C (PAD) ×3 IMPLANT
TRAY PACEMAKER INSERTION (PACKS) ×3 IMPLANT

## 2019-09-12 NOTE — Progress Notes (Signed)
Discharge instructions reviewed with pt and her daughter (via telephone) Both voice understanding.  

## 2019-09-12 NOTE — Progress Notes (Addendum)
Slight redness noted above dressing Dr Sallyanne Kuster in to assess states its ok States ok to discharge at 1600 no need for tele

## 2019-09-12 NOTE — Op Note (Signed)
Procedure report  Procedure performed:  1. Dual chamber pacemaker generator changeout  2. Light sedation  Reason for procedure:  1. Device generator at elective replacement interval  2. Sick sinus syndrome 3. 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  The patient's heart rate, blood pressure, and oxygen saturation are monitored continuously during the procedure. The period of conscious sedation is 35 minutes, of which I was present face-to-face 100% of this time.  Device details:   [place] number [id number], serial number [id number] Right atrial lead (chronic) St. Jude , model number [phone], serial number [id number] (implanted 04/27/2009) Right ventricular lead (chronic)   St. Jude , model number [phone], serial number [id number] (implanted 04/27/2009) Explanted generator Accent DR RF,  model number [id number], serial number  [id number] (implanted 04/27/2009)  Procedure details:  After the risks and benefits of the procedure were discussed the patient provided informed consent. She was brought to the cardiac catheter lab in the fasting state. The patient was prepped and draped in usual sterile fashion. Local anesthesia with 1% lidocaine was administered to to the left infraclavicular area. A 5-6cm horizontal incision was made parallel with and 2-3 cm caudal to the left clavicle, in the area of an old scar. An older scar was seen closer to the left clavicle. Using minimal electrocautery and mostly sharp and blunt dissection the prepectoral pocket was  opened carefully to avoid injury to the loops of chronic leads. Extensive dissection was not necessary. The device was explanted. The pocket was carefully inspected for hemostasis and flushed with copious amounts of antibiotic solution.  The leads were disconnected from the old generator and testing of the lead parameters later showed excellent values. The new generator was connected to the chronic leads, with appropriate pacing noted.   The entire system was then carefully inserted in the pocket with care been taking that the leads and device assumed a comfortable position without pressure on the incision. Great care was taken that the leads be located deep to the generator. The pocket was then closed in layers using 2 layers of 2-0 Vicryl and cutaneous staples after which a sterile dressing was applied.   At the end of the procedure the following lead parameters were encountered:   Right atrial lead sensed P waves >5.0 mV, impedance 410 ohms, threshold 0.7 at 0.5 ms pulse width.  Right ventricular lead sensed R waves  >12.0 mV, impedance 530 ohms, threshold 1.0 at 0.5 ms pulse width.  Sanda Klein, MD, [facility] [phone] office [phone] pager

## 2019-09-12 NOTE — H&P (Signed)
Cardiology Admission History and Physical:   Patient ID: [name] MRN: [id number]; DOB: 1932-07-09   Admission date: 09/12/2019  Primary Care Provider: Willey Blade, MD Primary Cardiologist: Sanda Klein, MD  Primary Electrophysiologist:  None   Chief Complaint:  Pacemaker at [facility]; SSS  Patient Profile:   [name] is a 85 y.o. female with SSS whose dual chamber pacemaker recently reached ERI.  History of Present Illness:   [name] of tachycardia-bradycardia syndrome with symptomatic sinus bradycardia and occasional paroxysmal atrial fibrillation, on chronic anticoagulation.  She has a history of minimal coronary atherosclerosis without flow-limiting lesions and a less than 50% stenosis in the right subclavian artery.  In the past she has had normal nuclear perfusion studies.  She takes a statin for hyperlipidemia, essential hypertension, CKD stage III, gout, and has a history of meningioma resection.  Her pacemaker was implanted in 2010.  Lead parameters are generally normal with exception of occasional "noise" on the ventricular channel.  She has excellent sensing, pacing threshold and impedance on both leads.  She never requires ventricular pacing, although she has greater than 90% atrial pacing.  Heart Pathway Score:     Past Medical History:  Diagnosis Date  . ACL (anterior cruciate ligament) rupture    Left  . Acute MI ([place])   . Arthritis   . Back pain    Secondary to arthritis  . Benign paroxysmal positional vertigo 03/18/2016  . Brain tumor ([place])    On right side. Removed in 1989  . Bruit    Duplex doppler 03/28/05 Mildly abnormal. *Right subclavian artery: Less than 50% diameter reduction.  . Chest pain    2D Echo 04/21/08 EF = >55%. Moderate tricuspid regurgitation. Persantine Myovoew stress test 04/21/08 EF = 81%, no evidence of ischemia noted.  . Chronic anticoagulation    PAF  . Chronic edema    Mild LE edema  . Chronic kidney disease    Mild, stage 2  . Coronary artery disease   . Coronary atherosclerosis    Minimal  . Dyslipidemia   . GERD (gastroesophageal reflux disease)   . Hyperlipidemia    On pravastatin  . Memory difficulty 07/13/2014  . Meningioma ([place])    Resection in 1998  . Mild depression ([place])   . Ovarian cyst    Benign. Had a salpiingoophorectomy in 2005.  [name] PAF (paroxysmal atrial fibrillation) (HCC)    On coumadin. St. Jude PPM (serial [id number]) inserted 2010  . Paroxysmal atrial flutter ([place])   . Seizure ([place]) 2006  . Severe obesity ([place])   . Sick sinus syndrome ([place])   . Sinus node dysfunction (HCC)    S/P implantation of a dual-chamber permanent pacemaker in 2010  . SOB (shortness of breath) on exertion    Class II. Catheterization 04/30/10 showed mild CAD with mildly elevated right heart pressures.   . Tachycardia-bradycardia syndrome ([place])    S/P implantation of dual-chamber permanent pacemaker in 2010  . Trochlear nerve palsy    Right  . Vertigo     Past Surgical History:  Procedure Laterality Date  . Brain tumor removal  1989  . CARDIAC CATHETERIZATION  04/30/10   Showed mild CAD with mildly elevated right heart pressures. (Dr. Elisabeth Cara)  . CRANIOTOMY  1989   (Brain tumor) With gamma knife procedure as well.  [name] KNEE SURGERY     Bilateral, arthroscopic  . PACEMAKER INSERTION  04/27/09   Implanted by Dr. Elisabeth Cara. [place] (serial [id number])  Medications Prior to Admission: Prior to Admission medications   Medication Sig Start Date End Date Taking? Authorizing Provider  acetaminophen (TYLENOL) 500 MG tablet Take 500 mg by mouth daily as needed for moderate pain or headache.   Yes [provider]  allopurinol (ZYLOPRIM) 100 MG tablet Take 100 mg by mouth daily.   Yes [provider]  Carboxymethylcellul-Glycerin (LUBRICATING EYE DROPS OP) Place 1 drop into both eyes daily as needed (dry eyes).   Yes [provider]  Cholecalciferol (DIALYVITE  VITAMIN D 5000) 125 MCG (5000 UT) capsule Take 5,000 Units by mouth daily.   Yes [provider]  colchicine 0.6 MG tablet Take 0.6 mg by mouth daily as needed (gout flare).    Yes [provider]  ferrous sulfate 325 (65 FE) MG tablet TAKE 1 TABLET (325 MG TOTAL) BY MOUTH EVERY MONDAY, WEDNESDAY, AND FRIDAY. 07/26/18  Yes Azaryah Oleksy, MD  KLOR-CON M10 10 MEQ tablet TAKE 1 TABLET (10 MEQ TOTAL) BY MOUTH 2 (TWO) TIMES DAILY. TAKE AN EXTRA 20 MEQ WITH FUROSEMIDE. Patient taking differently: Take 20 mEq by mouth daily.  09/05/19  Yes Noam Franzen, MD  metoprolol succinate (TOPROL-XL) 25 MG 24 hr tablet Take 1 tablet (25 mg total) by mouth daily. 08/23/19  Yes Allure Greaser, MD  nitroGLYCERIN (NITROLINGUAL) 0.4 MG/SPRAY spray Place 3 sprays under the tongue every 5 (five) minutes x 3 doses as needed for chest pain. 05/20/18  Yes Delesa Kawa, MD  omeprazole (PRILOSEC) 20 MG capsule Take 20 mg by mouth daily.   Yes [provider]  OVER THE COUNTER MEDICATION Place 6 drops under the tongue at bedtime as needed (anxiety/sleep). CBD oil   Yes [provider]  Polyethyl Glycol-Propyl Glycol (SYSTANE OP) Place 1 drop into both eyes at bedtime as needed (dry eyes).   Yes [provider]  pravastatin (PRAVACHOL) 20 MG tablet Take 20 mg by mouth at bedtime.   Yes [provider]  topiramate (TOPAMAX) 50 MG tablet Take 50 mg by mouth 2 (two) times daily.   Yes [provider]  torsemide (DEMADEX) 20 MG tablet Take 20 mg by mouth daily as needed (swelling).   Yes [provider]  verapamil (VERELAN PM) 240 MG 24 hr capsule Take 1 capsule (240 mg total) by mouth at bedtime. 05/30/19  Yes Ulah Olmo, MD  warfarin (COUMADIN) 4 MG tablet Take 4 mg by mouth See admin instructions. Take 4 mg at night on Mon, Tue, Wed, Fri, and Sat   Yes [provider]  warfarin (COUMADIN) 5 MG tablet Take 5 mg by mouth See admin instructions. Take  5 mg at night on Sun and Thur   Yes [provider]  furosemide (LASIX) 40 MG tablet Take 2 tablets (80 mg total) by mouth daily. Patient not taking: Reported on 09/07/2019 03/18/19 03/12/20  Othelia Riederer, Dani Gobble, MD     Allergies:    Allergies  Allergen Reactions  . Dilantin [Phenytoin Sodium Extended] Hives  . Phenobarbital Hives          Social History:   Social History   Socioeconomic History  . Marital status: Divorced    Spouse name: Not on file  . Number of children: 3  . Years of education: Not on file  . Highest education level: Not on file  Occupational History    Comment: Retired  Scientific laboratory technician  . Financial resource strain: Not on file  . Food insecurity    Worry: Not on  file    Inability: Not on file  . Transportation needs    Medical: Not on file    Non-medical: Not on file  Tobacco Use  . Smoking status: Former Smoker    Years: 20.00    Types: Cigarettes    Quit date: 12/23/2007    Years since quitting: 11.7  . Smokeless tobacco: Never Used  Substance and Sexual Activity  . Alcohol use: No    Alcohol/week: 0.0 standard drinks  . Drug use: No  . Sexual activity: Not on file  Lifestyle  . Physical activity    Days per week: Not on file    Minutes per session: Not on file  . Stress: Not on file  Relationships  . Social Herbalist on phone: Not on file    Gets together: Not on file    Attends religious service: Not on file    Active member of club or organization: Not on file    Attends meetings of clubs or organizations: Not on file    Relationship status: Not on file  . Intimate partner violence    Fear of current or ex partner: Not on file    Emotionally abused: Not on file    Physically abused: Not on file    Forced sexual activity: Not on file  Other Topics Concern  . Not on file  Social History Narrative  . Not on file    Family History:   The patient's family history includes Heart disease in her brother, brother, brother,  father, sister, sister, and sister; Stroke in her mother.    ROS:  Please see the history of present illness.  All other ROS reviewed and negative.     Physical Exam/Data:   Vitals:   09/12/19 1115  BP: (!) 169/71  Pulse: (!) 54  Resp: 18  Temp: (!) 97.2 F (36.2 C)  TempSrc: Skin  SpO2: 99%  Weight: 109.8 kg  Height: 5\' 5"  (1.651 m)   No intake or output data in the 24 hours ending 09/12/19 1319 Last 3 Weights 09/12/2019 07/25/2019 03/18/2019  Weight (lbs) 242 lb 218 lb 3.2 oz 230 lb  Weight (kg) 109.77 kg 98.975 kg 104.327 kg     Body mass index is 40.27 kg/m.  General:  Well nourished, well developed, in no acute distress.  Obese HEENT: normal Lymph: no adenopathy Neck: no JVD Endocrine:  No thryomegaly Vascular: No carotid bruits; FA pulses 2+ bilaterally without bruits  Cardiac:  normal S1, S2; RRR; no murmur  Lungs:  clear to auscultation bilaterally, no wheezing, rhonchi or rales  Abd: soft, nontender, no hepatomegaly  Ext: no edema Musculoskeletal:  No deformities, BUE and BLE strength normal and equal Skin: warm and dry  Neuro:  CNs 2-12 intact, no focal abnormalities noted Psych:  Normal affect    EKG:  The ECG that was done today was personally reviewed and demonstrates atrial paced, ventricular sensed rhythm with occasional premature atrial complexes.  Otherwise normal tracing.  Relevant CV Studies: Echo November 25, 2018  - Left ventricle: The cavity size was mildly reduced. There was   mild focal basal hypertrophy of the septum. Systolic function was   normal. The estimated ejection fraction was in the range of 50%   to 55%. Wall motion was normal; there were no regional wall   motion abnormalities. The study is indeterminate for the   evaluation of LV diastolic function. - Aortic valve: Trileaflet; mildly thickened, mildly calcified  leaflets. Sclerosis without stenosis. - Mitral valve: There was mild regurgitation. - Tricuspid valve: There was  trivial regurgitation. - Pulmonary arteries: Systolic pressure was within the normal   range.  Impressions:  - EF 53% by 3D imaging, which agrees with visual and 2D estimates.   GLS mildly abnormal at -16.2%. No significant wall motion   abnormalities.   Laboratory Data:  High Sensitivity Troponin:  No results for input(s): TROPONINIHS in the last 720 hours.    Chemistry Recent Labs  Lab 09/06/19 1214  NA 145*  K 4.0  CL 111*  CO2 17*  GLUCOSE 89  BUN 30*  CREATININE 1.23*  CALCIUM 9.5  GFRNONAA 40*  GFRAA 47*    No results for input(s): PROT, ALBUMIN, AST, ALT, ALKPHOS, BILITOT in the last 168 hours. Hematology Recent Labs  Lab 09/06/19 1214  WBC 5.2  RBC 4.95  HGB 14.1  HCT 42.7  MCV 86  MCH 28.5  MCHC 33.0  RDW 14.4  PLT 200   BNPNo results for input(s): BNP, PROBNP in the last 168 hours.  DDimer No results for input(s): DDIMER in the last 168 hours.   Radiology/Studies:  No results found.  Assessment and Plan:   1. Pacemaker battery depletion: She has severe symptomatic sinus bradycardia with greater than 90% atrial pacing.   2. Pacemaker: Atrial and ventricular lead parameters are acceptable; although she has occasional "noise" on the ventricular channel this has not had any real impact on device functional.  Especially in view of the need for anticoagulation and the fact that she is 85 years old, do not plan lead revision at this time. 3. Parox AFib: INR was 2.3 today.  Plan generator change out only.This procedure has been fully reviewed with the patient and written informed consent has been obtained.    For questions or updates, please contact [place] Please consult [email or web address] for contact info under        Signed, Sanda Klein, MD  09/12/2019 1:19 PM

## 2019-09-12 NOTE — Discharge Instructions (Signed)

## 2019-09-13 ENCOUNTER — Encounter (HOSPITAL_COMMUNITY): Payer: Self-pay | Admitting: Cardiovascular Disease

## 2019-09-13 NOTE — Progress Notes (Signed)
Remote pacemaker transmission.   

## 2019-09-18 ENCOUNTER — Other Ambulatory Visit: Payer: Self-pay | Admitting: Cardiovascular Disease

## 2019-09-20 ENCOUNTER — Telehealth: Payer: Self-pay | Admitting: Cardiovascular Disease

## 2019-09-20 NOTE — Telephone Encounter (Signed)
New Message   Patient's daughter is calling in to get clearance to accompany patient to her appointment. Patient is unable to walk and needs assistance. Please call back and confirm.

## 2019-09-21 NOTE — Telephone Encounter (Signed)
Daughter notified she may accompany [atient to appointment due to mobility issues.

## 2019-09-22 ENCOUNTER — Other Ambulatory Visit: Payer: Self-pay

## 2019-09-22 ENCOUNTER — Ambulatory Visit (INDEPENDENT_AMBULATORY_CARE_PROVIDER_SITE_OTHER): Payer: Medicare Other | Admitting: Student

## 2019-09-22 DIAGNOSIS — I495 Sick sinus syndrome: Secondary | ICD-10-CM

## 2019-09-22 LAB — CUP PACEART INCLINIC DEVICE CHECK
Battery Remaining Longevity: 116 mo
Battery Voltage: 3.1 V
Brady Statistic RA Percent Paced: 68 %
Brady Statistic RV Percent Paced: 14 %
Date Time Interrogation Session: 20201001131146
Implantable Lead Implant Date: 20100507
Implantable Lead Implant Date: 20100507
Implantable Lead Location: 753859
Implantable Lead Location: 753860
Implantable Pulse Generator Implant Date: 20200921
Lead Channel Impedance Value: 412.5 Ohm
Lead Channel Impedance Value: 525 Ohm
Lead Channel Pacing Threshold Amplitude: 0.5 V
Lead Channel Pacing Threshold Amplitude: 0.75 V
Lead Channel Pacing Threshold Pulse Width: 0.5 ms
Lead Channel Pacing Threshold Pulse Width: 0.5 ms
Lead Channel Sensing Intrinsic Amplitude: 12 mV
Lead Channel Sensing Intrinsic Amplitude: 4.7 mV
Lead Channel Setting Pacing Amplitude: 1 V
Lead Channel Setting Pacing Amplitude: 1.5 V
Lead Channel Setting Pacing Pulse Width: 0.5 ms
Lead Channel Setting Sensing Sensitivity: 4 mV
Pulse Gen Model: 2272
Pulse Gen Serial Number: 9166074

## 2019-09-22 NOTE — Progress Notes (Signed)
Wound check appointment. Steri-strips removed. Wound without redness or edema. Incision edges approximated, wound well healing. Small lip in the middle from bandage. No drainage or bleeding. Instructed to continue to keep clean and dry. Ok to shower. No lotions/creams. Normal device function. Thresholds, sensing, and impedances consistent with implant measurements. Device programmed at appropriate safety margin with chronic leads. Histogram distribution appropriate for patient and level of activity. 30% Afib noted. [place] noted. Patient educated about wound care, arm mobility, lifting restrictions. ROV in 3 months with implanting physician.  Legrand Como [id number] [place], PA-C  09/22/2019 1:08 PM

## 2019-10-05 ENCOUNTER — Other Ambulatory Visit: Payer: Self-pay | Admitting: Cardiovascular Disease

## 2019-10-28 ENCOUNTER — Ambulatory Visit: Payer: Medicare Other | Admitting: Podiatry

## 2019-11-04 ENCOUNTER — Other Ambulatory Visit: Payer: Self-pay

## 2019-11-04 ENCOUNTER — Ambulatory Visit: Payer: Medicare Other | Admitting: Podiatry

## 2019-11-04 ENCOUNTER — Encounter: Payer: Self-pay | Admitting: Podiatry

## 2019-11-04 DIAGNOSIS — B351 Tinea unguium: Secondary | ICD-10-CM

## 2019-11-04 DIAGNOSIS — M79674 Pain in right toe(s): Secondary | ICD-10-CM

## 2019-11-04 DIAGNOSIS — M79675 Pain in left toe(s): Secondary | ICD-10-CM

## 2019-11-04 DIAGNOSIS — Z9229 Personal history of other drug therapy: Secondary | ICD-10-CM

## 2019-11-04 NOTE — Progress Notes (Signed)
  Subjective:  Patient ID: [name], female    DOB: 06-Aug-1932,  MRN: [id number]  Chief Complaint  Patient presents with  . Follow-up    RFC- Nail tim , no questions or concerns   85 y.o. female returns for the above complaint.  Patient is here for diabetic foot care.  She states that her toenails have been painful.  She states is worse when she is ambulating.  There are painful discolored thick toenails which are red.  Activities.  She has been wearing good shoe gear.  She is known to Dr. Adah Perl primarily treats her.  She denies any other acute complaints. Objective:  There were no vitals filed for this visit. Podiatric Exam: Vascular: dorsalis pedis and posterior tibial pulses are palpable bilateral. Capillary return is immediate. Temperature gradient is WNL. Skin turgor WNL  Sensorium: Normal Semmes Weinstein monofilament test. Normal tactile sensation bilaterally. Nail Exam: Pt has thick disfigured discolored nails with subungual debris noted bilateral entire nail hallux through fifth toenails Ulcer Exam: There is no evidence of ulcer or pre-ulcerative changes or infection. Orthopedic Exam: Muscle tone and strength are WNL. No limitations in general ROM. No crepitus or effusions noted. HAV  B/L.  Hammer toes 2-5  B/L. Skin: No Porokeratosis. No infection or ulcers  Assessment & Plan:  Patient was evaluated and treated and all questions answered.  Onychomycosis with pain  -Nails palliatively debrided as below. -Educated on self-care  Procedure: Nail Debridement Rationale: pain  Type of Debridement: manual, sharp debridement. Instrumentation: Nail nipper, rotary burr. Number of Nails: 10  Procedures and Treatment: Consent by patient was obtained for treatment procedures. The patient understood the discussion of treatment and procedures well. All questions were answered thoroughly reviewed. Debridement of mycotic and hypertrophic toenails, 1 through 5 bilateral and  clearing of subungual debris. No ulceration, no infection noted.  Return Visit-Office Procedure: Patient instructed to return to the office for a follow up visit 3 months for continued evaluation and treatment.  Boneta Lucks, DPM    No follow-ups on file.

## 2019-12-07 ENCOUNTER — Telehealth: Payer: Self-pay | Admitting: Cardiovascular Disease

## 2019-12-07 NOTE — Telephone Encounter (Signed)
New message    Patient requesting her daughter accompany her during upcoming appt, needs assistance walking

## 2019-12-07 NOTE — Telephone Encounter (Signed)
Pts daughter will help her up the stairs and will come back to get her after her appt with Dr. Sallyanne Kuster 12/12/19 she says she will be fine once she gets upstairs.

## 2019-12-12 ENCOUNTER — Encounter (INDEPENDENT_AMBULATORY_CARE_PROVIDER_SITE_OTHER): Payer: Self-pay

## 2019-12-12 ENCOUNTER — Other Ambulatory Visit: Payer: Self-pay

## 2019-12-12 ENCOUNTER — Other Ambulatory Visit: Payer: Self-pay | Admitting: Cardiovascular Disease

## 2019-12-12 ENCOUNTER — Ambulatory Visit (INDEPENDENT_AMBULATORY_CARE_PROVIDER_SITE_OTHER): Payer: Medicare Other | Admitting: Cardiovascular Disease

## 2019-12-12 ENCOUNTER — Encounter: Payer: Self-pay | Admitting: Cardiovascular Disease

## 2019-12-12 VITALS — BP 167/81 | HR 68 | Temp 96.8°F | Ht 65.0 in | Wt 241.6 lb

## 2019-12-12 DIAGNOSIS — Z950 Presence of cardiac pacemaker: Secondary | ICD-10-CM

## 2019-12-12 DIAGNOSIS — M10032 Idiopathic gout, left wrist: Secondary | ICD-10-CM

## 2019-12-12 DIAGNOSIS — I495 Sick sinus syndrome: Secondary | ICD-10-CM | POA: Diagnosis not present

## 2019-12-12 DIAGNOSIS — I5033 Acute on chronic diastolic (congestive) heart failure: Secondary | ICD-10-CM

## 2019-12-12 DIAGNOSIS — Z7901 Long term (current) use of anticoagulants: Secondary | ICD-10-CM | POA: Diagnosis not present

## 2019-12-12 DIAGNOSIS — I480 Paroxysmal atrial fibrillation: Secondary | ICD-10-CM

## 2019-12-12 DIAGNOSIS — I2510 Atherosclerotic heart disease of native coronary artery without angina pectoris: Secondary | ICD-10-CM

## 2019-12-12 DIAGNOSIS — I10 Essential (primary) hypertension: Secondary | ICD-10-CM

## 2019-12-12 MED ORDER — COLCHICINE 0.6 MG PO TABS: 0.6000 mg | ORAL_TABLET | Freq: Every day | ORAL | 0 refills | Status: AC | PRN

## 2019-12-12 MED ORDER — TORSEMIDE 20 MG PO TABS: 40.0000 mg | ORAL_TABLET | Freq: Every day | ORAL | 11 refills | Status: DC

## 2019-12-12 NOTE — Patient Instructions (Signed)
Medication Instructions:  TAKE: Torsemide 40 mg once daily (2 of the 20 mg tablets) TAKE: Colchicine 0.6 mg once daily for the next 3-5 days until the symptoms have subsided.  *If you need a refill on your cardiac medications before your next appointment, please call your pharmacy*  Lab Work: None ordered If you have labs (blood work) drawn today and your tests are completely normal, you will receive your results only by: [name] MyChart Message (if you have MyChart) OR . A paper copy in the mail If you have any lab test that is abnormal or we need to change your treatment, we will call you to review the results.  Testing/Procedures: None ordered  Follow-Up: At [facility], you and your health needs are our priority.  As part of our continuing mission to provide you with exceptional heart care, we have created designated Provider Care Teams.  These Care Teams include your primary Cardiologist (physician) and Advanced Practice Providers (APPs -  Physician Assistants and Nurse Practitioners) who all work together to provide you with the care you need, when you need it.  Your next appointment:   6 month(s)  The format for your next appointment:   In Person  Provider:   Sanda Klein, MD

## 2019-12-12 NOTE — Progress Notes (Signed)
Cardiology Office Note    Date:  12/12/2019   ID:  [name] 09/06/1932, MRN [id number]  PCP:  Willey Blade, MD  Cardiologist:   Sanda Klein, MD   Chief Complaint  Patient presents with  . Atrial Fibrillation  . Pacemaker Check  . Congestive Heart Failure    History of Present Illness:  [name] is a 85 y.o. female here for follow-up of her dual-chamber permanent pacemaker. The device was implanted in 2010 for tachycardia-bradycardia syndrome (paroxysmal atrial tachycardia, paroxysmal atrial fibrillation, symptomatic sinus bradycardia) and she underwent a generator change out in September 2020.  Typical weight has been 220-225 pounds.  Recently she has been weighing around 240 pounds.  She has some lower extremity edema, but continues to ride her stationary bicycle for "2-3 miles" (20 minutes) daily.  She has mild chronic dyspnea, but does not appear to be limited (she is very sedentary otherwise).  Although she has occasional palpitations she is often unaware of the arrhythmia.  Recently she has had episodes lasting for several hours that she was not aware of.  Rate control was always good.  She has a tender and warm left wrist (especially at the radial aspect of the joint.  She has a history of gout.  Her medicine list contains both furosemide and torsemide, but she reports that she is only been taking torsemide 20 mg once daily.  She is taking potassium supplement 20 mEq daily.  The pacemaker site has healed well.  Comprehensive interrogation shows stable and normal lead parameters and an estimated device longevity of about 9 years.  She has 90% atrial pacing and only 3% ventricular pacing and the burden of atrial fibrillation is 7%, similar to her chronic pattern.  Rate control is appropriate.  Her echocardiogram performed last October was interpreted as normal diastolic function.  On my review however the diastolic annulus velocities are actually  decreased and the E/e' was 13-14, signifying probable volume overload.  She has preserved left ventricular systolic function.  She has never had embolic events that we are aware of. She has normal left ventricular systolic function by previous echo, normal nuclear stress testing, had minimal CAD on angiography 2011. She does have a history of seizures associated with resection of multiple meningiomas in the past.  No recurrence of seizures while on treatment with Topamax   Past Medical History:  Diagnosis Date  . ACL (anterior cruciate ligament) rupture    Left  . Acute MI ([place])   . Arthritis   . Back pain    Secondary to arthritis  . Benign paroxysmal positional vertigo 03/18/2016  . Brain tumor ([place])    On right side. Removed in 1989  . Bruit    Duplex doppler 03/28/05 Mildly abnormal. *Right subclavian artery: Less than 50% diameter reduction.  . Chest pain    2D Echo 04/21/08 EF = >55%. Moderate tricuspid regurgitation. Persantine Myovoew stress test 04/21/08 EF = 81%, no evidence of ischemia noted.  . Chronic anticoagulation    PAF  . Chronic edema    Mild LE edema  . Chronic kidney disease    Mild, stage 2  . Coronary artery disease   . Coronary atherosclerosis    Minimal  . Dyslipidemia   . GERD (gastroesophageal reflux disease)   . Hyperlipidemia    On pravastatin  . Memory difficulty 07/13/2014  . Meningioma ([place])    Resection in 1998  . Mild depression ([place])   . Ovarian  cyst    Benign. Had a salpiingoophorectomy in 2005.  [name] PAF (paroxysmal atrial fibrillation) (HCC)    On coumadin. St. Jude PPM (serial [id number]) inserted 2010  . Paroxysmal atrial flutter ([place])   . Seizure ([place]) 2006  . Severe obesity ([place])   . Sick sinus syndrome ([place])   . Sinus node dysfunction (HCC)    S/P implantation of a dual-chamber permanent pacemaker in 2010  . SOB (shortness of breath) on exertion    Class II. Catheterization 04/30/10 showed mild CAD with mildly elevated right heart  pressures.   . Tachycardia-bradycardia syndrome ([place])    S/P implantation of dual-chamber permanent pacemaker in 2010  . Trochlear nerve palsy    Right  . Vertigo     Past Surgical History:  Procedure Laterality Date  . Brain tumor removal  1989  . CARDIAC CATHETERIZATION  04/30/10   Showed mild CAD with mildly elevated right heart pressures. (Dr. Elisabeth Cara)  . CRANIOTOMY  1989   (Brain tumor) With gamma knife procedure as well.  [name] KNEE SURGERY     Bilateral, arthroscopic  . PACEMAKER INSERTION  04/27/09   Implanted by Dr. Elisabeth Cara. [place] (serial [id number])  . PPM GENERATOR CHANGEOUT N/A 09/12/2019   Procedure: PPM GENERATOR CHANGEOUT;  Surgeon: Sanda Klein, MD;  Location: [facility];  Service: Cardiovascular;  Laterality: N/A;    Current Medications: Outpatient Medications Prior to Visit  Medication Sig Dispense Refill  . acetaminophen (TYLENOL) 500 MG tablet Take 500 mg by mouth daily as needed for moderate pain or headache.    . allopurinol (ZYLOPRIM) 100 MG tablet Take 100 mg by mouth daily.    [name] aspirin 81 MG EC tablet Adult Low Dose Aspirin 81 mg tablet,delayed release  Take 1 tablet every day by oral route.    . Carboxymethylcellul-Glycerin (LUBRICATING EYE DROPS OP) Place 1 drop into both eyes daily as needed (dry eyes).    . Cholecalciferol (DIALYVITE VITAMIN D 5000) 125 MCG (5000 UT) capsule Take 5,000 Units by mouth daily.    . Coenzyme Q10 10 MG capsule Co Q-10 10 mg capsule  Take 1 capsule every day by oral route.    . ergocalciferol (VITAMIN D2) 1.25 MG (50000 UT) capsule ergocalciferol (vitamin D2) 1,250 mcg (50,000 unit) capsule    . ferrous sulfate 325 (65 FE) MG tablet TAKE 1 TABLET (325 MG TOTAL) BY MOUTH EVERY MONDAY, WEDNESDAY, AND FRIDAY. 36 tablet 7  . KLOR-CON M10 10 MEQ tablet TAKE 1 TABLET (10 MEQ TOTAL) BY MOUTH 2 (TWO) TIMES DAILY. TAKE AN EXTRA 20 MEQ WITH FUROSEMIDE. (Patient taking differently: Take 20 mEq by mouth daily. ) 240  tablet 3  . metoprolol succinate (TOPROL-XL) 25 MG 24 hr tablet TAKE 1 TABLET BY MOUTH EVERY DAY 90 tablet 1  . neomycin-polymyxin-dexameth (MAXITROL) 0.1 % OINT neomycin 3.5 mg/g-polymyxin B 10,000 unit/g-dexameth 0.1 % eye oint    . nitroGLYCERIN (NITROLINGUAL) 0.4 MG/SPRAY spray Place 3 sprays under the tongue every 5 (five) minutes x 3 doses as needed for chest pain. 12 g 3  . omeprazole (PRILOSEC) 20 MG capsule Take 20 mg by mouth daily.    [name] OVER THE COUNTER MEDICATION Place 6 drops under the tongue at bedtime as needed (anxiety/sleep). CBD oil    . Polyethyl Glycol-Propyl Glycol (SYSTANE OP) Place 1 drop into both eyes at bedtime as needed (dry eyes).    . potassium chloride (KLOR-CON) 10 MEQ tablet potassium chloride ER 10 mEq tablet,extended  release    . pravastatin (PRAVACHOL) 20 MG tablet Take 20 mg by mouth at bedtime.    . topiramate (TOPAMAX) 50 MG tablet Take 50 mg by mouth 2 (two) times daily.    . verapamil (VERELAN PM) 240 MG 24 hr capsule Take 1 capsule (240 mg total) by mouth at bedtime. 90 capsule 2  . warfarin (COUMADIN) 4 MG tablet Take 4 mg by mouth See admin instructions. Take 4 mg at night on Mon, Tue, Wed, Fri, and Sat    . warfarin (COUMADIN) 5 MG tablet Take 5 mg by mouth See admin instructions. Take 5 mg at night on Sun and Thur    . Acetaminophen-Codeine 300-30 MG tablet acetaminophen 300 mg-codeine 30 mg tablet    . colchicine 0.6 MG tablet Take 0.6 mg by mouth daily as needed (gout flare).     . furosemide (LASIX) 40 MG tablet Take 2 tablets (80 mg total) by mouth daily. 180 tablet 3  . torsemide (DEMADEX) 20 MG tablet Take 20 mg by mouth daily as needed (swelling).     No facility-administered medications prior to visit.     Allergies:   Dilantin [phenytoin sodium extended] and Phenobarbital   Social History   Socioeconomic History  . Marital status: Divorced    Spouse name: Not on file  . Number of children: 3  . Years of education: Not on file  .  Highest education level: Not on file  Occupational History    Comment: Retired  Tobacco Use  . Smoking status: Former Smoker    Years: 20.00    Types: Cigarettes    Quit date: 12/23/2007    Years since quitting: 11.9  . Smokeless tobacco: Never Used  Substance and Sexual Activity  . Alcohol use: No    Alcohol/week: 0.0 standard drinks  . Drug use: No  . Sexual activity: Not on file  Other Topics Concern  . Not on file  Social History Narrative  . Not on file   Social Determinants of Health   Financial Resource Strain:   . Difficulty of Paying Living Expenses: Not on file  Food Insecurity:   . Worried About Charity fundraiser in the Last Year: Not on file  . Ran Out of Food in the Last Year: Not on file  Transportation Needs:   . Lack of Transportation (Medical): Not on file  . Lack of Transportation (Non-Medical): Not on file  Physical Activity:   . Days of Exercise per Week: Not on file  . Minutes of Exercise per Session: Not on file  Stress:   . Feeling of Stress : Not on file  Social Connections:   . Frequency of Communication with Friends and Family: Not on file  . Frequency of Social Gatherings with Friends and Family: Not on file  . Attends Religious Services: Not on file  . Active Member of Clubs or Organizations: Not on file  . Attends Archivist Meetings: Not on file  . Marital Status: Not on file     Family History:  The patient's family history includes Heart disease in her brother, brother, brother, father, sister, sister, and sister; Stroke in her mother.   ROS:   Please see the history of present illness.    ROS All other systems are reviewed and are negative.   PHYSICAL EXAM:   VS:  BP (!) 167/81   Pulse 68   Temp (!) 96.8 F (36 C)   Ht 5\' 5"  (  1.651 m)   Wt 241 lb 9.6 oz (109.6 kg)   SpO2 98%   BMI 40.20 kg/m     General: Alert, oriented x3, no distress, morbidly obese.  The left subclavian scar at the pacemaker site is  well-healed. Head: no evidence of trauma, PERRL, EOMI, no exophtalmos or lid lag, no myxedema, no xanthelasma; normal ears, nose and oropharynx Neck: normal jugular venous pulsations and no hepatojugular reflux; brisk carotid pulses without delay and no carotid bruits Chest: clear to auscultation, no signs of consolidation by percussion or palpation, normal fremitus, symmetrical and full respiratory excursions Cardiovascular: normal position and quality of the apical impulse, regular rhythm, normal first and second heart sounds, no murmurs, rubs or gallops Abdomen: no tenderness or distention, no masses by palpation, no abnormal pulsatility or arterial bruits, normal bowel sounds, no hepatosplenomegaly Extremities: no clubbing, cyanosis or edema; 2+ radial, ulnar and brachial pulses bilaterally; 2+ right femoral, posterior tibial and dorsalis pedis pulses; 2+ left femoral, posterior tibial and dorsalis pedis pulses; no subclavian or femoral bruits Neurological: grossly nonfocal Psych: Normal mood and affect   Wt Readings from Last 3 Encounters:  12/12/19 241 lb 9.6 oz (109.6 kg)  09/12/19 242 lb (109.8 kg)  07/25/19 218 lb 3.2 oz (99 kg)      Studies/Labs Reviewed:   ECHO 11/25/2018  - Left ventricle: The cavity size was mildly reduced. There was   mild focal basal hypertrophy of the septum. Systolic function was   normal. The estimated ejection fraction was in the range of 50%   to 55%. Wall motion was normal; there were no regional wall   motion abnormalities. The study is indeterminate for the   evaluation of LV diastolic function. - Aortic valve: Trileaflet; mildly thickened, mildly calcified   leaflets. Sclerosis without stenosis. - Mitral valve: There was mild regurgitation. - Tricuspid valve: There was trivial regurgitation. - Pulmonary arteries: Systolic pressure was within the normal   range.  Impressions:  - EF 53% by 3D imaging, which agrees with visual and 2D  estimates.   GLS mildly abnormal at -16.2%. No significant wall motion   abnormalities.   EKG:  EKG is ordered today.  It shows atrial paced, ventricular sensed rhythm with a long AV delay of 230 ms but is otherwise a normal tracing.  No repolarization abnormalities, QTC 414 ms.  Recent Labs: 09/06/2019: BUN 30; Creatinine, Ser 1.23; Hemoglobin 14.1; Platelets 200; Potassium 4.0; Sodium 145  Lipid Panel  No results found for: CHOL, TRIG, HDL, CHOLHDL, VLDL, LDLCALC, LDLDIRECT, LABVLDL  Due to have a lipid profile checked at her physical with Dr. Karlton Lemon in early January.  ASSESSMENT:    1. Acute on chronic diastolic heart failure (HCC)   2. Paroxysmal atrial fibrillation ([place])   3. Long term current use of anticoagulant   4. SSS (sick sinus syndrome) ([place])   5. Pacemaker   6. Essential hypertension   7. Coronary artery disease involving native coronary artery of native heart without angina pectoris   8. Morbid obesity ([place])   9. Acute idiopathic gout of left wrist      PLAN:  In order of problems listed above:  1. Acute on chronic diastolic heart failure: Although she may have gained some real weight, I still believe she is clearly hypervolemic, roughly 10 pounds above dry weight at least.  Reviewed the importance of a sodium restricted diet.  Recommended that she increase the torsemide to 40 mg once daily.  2. Paroxysmal atrial  fibrillation: Stable and low burden of atrial fibrillation, well rate controlled.  On anticoagulation.  CHADSVasc 5 (age 89, HTN, gender, new Dx of CHF). 3. Anticoagulation with warfarin, monitored by PCP.  Well-tolerated without bleeding complications. 4. Sinus node dysfunction which 100% atrial paced when she is not in atrial fibrillation.  Heart rate histograms are blunted but she is quite sedentary. 5. PPM: Resume every 73-month remote downloads. 6. Essential hypertension blood pressure is a little high today, but I suspect will improve with  additional diuresis.  No change in mental antihypertensive medications. 7. CAD: Asymptomatic.  She had minor coronary atherosclerosis by previous cardiac catheterization 8. Obesity: Part of her weight gain is probably due to fluid, but I think she is also gained real weight and is now morbidly obese range. 9. Gout: It is quite likely that her right wrist tenderness and warmth is due to a gout attack.  Recommended colchicine.  She should start this before she increases the dose of loop diuretic.  There is a possible interaction with the colchicine with verapamil but short-term use should be well-tolerated.  Recommended that she use the colchicine for no more than 3 days, maximum 5 days. 10. CKD 3: Most recent creatinine 1.23.  Will have labs in early January with Dr. Karlton Lemon. 11. HLP: On statin, labs next month.    Medication Adjustments/Labs and Tests Ordered: Current medicines are reviewed at length with the patient today.  Concerns regarding medicines are outlined above.  Medication changes, Labs and Tests ordered today are listed in the Patient Instructions below. Patient Instructions  Medication Instructions:  TAKE: Torsemide 40 mg once daily (2 of the 20 mg tablets) TAKE: Colchicine 0.6 mg once daily for the next 3-5 days until the symptoms have subsided.  *If you need a refill on your cardiac medications before your next appointment, please call your pharmacy*  Lab Work: None ordered If you have labs (blood work) drawn today and your tests are completely normal, you will receive your results only by: [name] MyChart Message (if you have MyChart) OR . A paper copy in the mail If you have any lab test that is abnormal or we need to change your treatment, we will call you to review the results.  Testing/Procedures: None ordered  Follow-Up: At [facility], you and your health needs are our priority.  As part of our continuing mission to provide you with exceptional heart care, we have  created designated Provider Care Teams.  These Care Teams include your primary Cardiologist (physician) and Advanced Practice Providers (APPs -  Physician Assistants and Nurse Practitioners) who all work together to provide you with the care you need, when you need it.  Your next appointment:   6 month(s)  The format for your next appointment:   In Person  Provider:   Sanda Klein, MD      Signed, Sanda Klein, MD  12/12/2019 11:00 AM    [facility] [address], [place], [place]  [address] Phone: [phone]; Fax: [phone]: There is

## 2019-12-29 ENCOUNTER — Telehealth: Payer: Self-pay | Admitting: Cardiovascular Disease

## 2019-12-29 ENCOUNTER — Telehealth: Payer: Self-pay | Admitting: Neurology

## 2019-12-29 NOTE — Telephone Encounter (Signed)
The patient's daughter has been made aware and verbalized her understanding.

## 2019-12-29 NOTE — Telephone Encounter (Signed)
[name], concerned about pt wellbutrin 100mg  po bid (this may be titration dose) ordered by pcp.  She is asking if this something that would work for arthritis.  I relayed that really the ordering pcp or pharmacist should be the ones that would be the best ones to speak to about this and her questions.  She still wanted me to ask sarah, np (since on medications.  Please advise.

## 2019-12-29 NOTE — Telephone Encounter (Signed)
ew Message      Pt c/o medication issue:  1. Name of Medication: Bupropion   2. How are you currently taking this medication (dosage and times per day)? Not currently taking   3. Are you having a reaction (difficulty breathing--STAT)? No   4. What is your medication issue? [place] is calling and says the PC  Prescribed this medication for pain and also to treat Depression. She asked the PC If she thought the pt was depressed and she responded "we are all depressed"  Jesse Sans is worried about her starting this medication and wondering If it will interfere with any other medication she is taking     Please call

## 2019-12-29 NOTE — Telephone Encounter (Signed)
Patients daughter Jesse Sans ( On Dpr) stating the patient PCP has prescribed her Buspar 100mg  BID for pain due to arthritis , but she states she notices that it treats depression also , she asked the PCP does she thing patient is depressed and PCP advised that everyone is depressed. She states she feels uncomfortable giving to the patient and she wants to know if the med is ok for patient to take with the other meds that she is on.  May call daughter Jesse Sans back [phone]

## 2019-12-29 NOTE — Telephone Encounter (Signed)
Should not be a problem

## 2019-12-29 NOTE — Telephone Encounter (Signed)
The daughter called wanting to make sure it was safe for the patient to take Bupropion 100 mg twice a day with her cardiac medications.

## 2020-01-02 NOTE — Telephone Encounter (Signed)
I relayed the recommendation to daughter of pt, [name] that wellbutrin or bupropion is associated with seizures and would touch base with pcp for other options. She is to call back if questions.

## 2020-01-02 NOTE — Telephone Encounter (Signed)
I would not recommend Wellbutrin given history of seizure. Wellbutrin can be associated with seizures, I would suggest another option to be discussed with PCP.

## 2020-01-02 NOTE — Telephone Encounter (Signed)
Confimed when spoke to daughter that it was bupropion (wellbutrin) no buspar.

## 2020-02-06 ENCOUNTER — Encounter: Payer: Self-pay | Admitting: Podiatry

## 2020-02-06 ENCOUNTER — Other Ambulatory Visit: Payer: Self-pay

## 2020-02-06 ENCOUNTER — Ambulatory Visit: Payer: Medicare Other | Admitting: Podiatry

## 2020-02-06 DIAGNOSIS — M79675 Pain in left toe(s): Secondary | ICD-10-CM

## 2020-02-06 DIAGNOSIS — B351 Tinea unguium: Secondary | ICD-10-CM

## 2020-02-06 DIAGNOSIS — M79674 Pain in right toe(s): Secondary | ICD-10-CM

## 2020-02-06 NOTE — Patient Instructions (Signed)

## 2020-02-06 NOTE — Progress Notes (Signed)
Subjective: [name] presents today for follow up of painful mycotic nails b/l that are difficult to trim. Pain interferes with ambulation. Aggravating factors include wearing enclosed shoe gear. Pain is relieved with periodic professional debridement.   Her daughter is present during today's visit. They voice no new pedal concerns on today's visit  Allergies  Allergen Reactions  . Dilantin [Phenytoin Sodium Extended] Hives  . Phenobarbital Hives           Objective: There were no vitals filed for this visit.  Vascular Examination:  Capillary fill time to digits <3s b/l, faintly palpable pedal pulses b/l, pedal hair absent b/l, skin temperature gradient within normal limits b/l, trace edema noted b/l feet and evidence of chronic venous insufficiency b/l LE  Dermatological Examination: Pedal skin is thin shiny, atrophic bilaterally, no open wounds bilaterally, no interdigital macerations bilaterally and toenails 1-5 b/l elongated, dystrophic, thickened, crumbly with subungual debris  Musculoskeletal: Normal muscle strength 5/5 to all lower extremity muscle groups bilaterally, no pain crepitus or joint limitation noted with ROM b/l and hammertoes noted to the  left 2nd/3rd digits  Neurological: Protective sensation intact 5/5 intact bilaterally with 10g monofilament b/l and vibratory sensation intact b/l  Assessment: 1. Pain due to onychomycosis of toenails of both feet    Plan: -Toenails 1-5 b/l were debrided in length and girth without iatrogenic bleeding. -Patient to continue soft, supportive shoe gear daily. -Patient to report any pedal injuries to medical professional immediately. -Patient/POA to call should there be question/concern in the interim.  Return in about 3 months (around 05/05/2020) for nail trim.

## 2020-02-20 ENCOUNTER — Other Ambulatory Visit: Payer: Self-pay | Admitting: Cardiovascular Disease

## 2020-02-23 ENCOUNTER — Ambulatory Visit: Payer: Medicare Other | Admitting: Neurology

## 2020-02-23 ENCOUNTER — Encounter: Payer: Self-pay | Admitting: Neurology

## 2020-02-23 ENCOUNTER — Other Ambulatory Visit: Payer: Self-pay

## 2020-02-23 VITALS — BP 176/89 | HR 68 | Ht 65.0 in | Wt 239.0 lb

## 2020-02-23 DIAGNOSIS — R413 Other amnesia: Secondary | ICD-10-CM | POA: Diagnosis not present

## 2020-02-23 DIAGNOSIS — R569 Unspecified convulsions: Secondary | ICD-10-CM | POA: Diagnosis not present

## 2020-02-23 DIAGNOSIS — D429 Neoplasm of uncertain behavior of meninges, unspecified: Secondary | ICD-10-CM

## 2020-02-23 NOTE — Patient Instructions (Signed)
It was good to see you today.  Continue taking Topamax Consider CT scan of the head, let me know Return in 8 months

## 2020-02-23 NOTE — Progress Notes (Signed)
PATIENT: [name] DOB: June 11, 1932  REASON FOR VISIT: follow up HISTORY FROM: patient  HISTORY OF PRESENT ILLNESS: Today 02/23/20  [name] is an 86 year old female history of meningioma status post resection.  She has not had any seizure events.  She is taking Topamax 50 mg twice a day.  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  She is on Coumadin, she says couple months ago she got a new pacemaker, that she thinks is MRI compatible.  She says she is aging, things are not working as well as they used to.  She has arthritis, pain in her shoulders, and knees.  She uses a cane for ambulation, she has not fallen.  She lives with her daughter, does her own ADLs, but her daughter is available for supervision.  She reports some headaches in the afternoon occasionally, are not significant.  Her memory score was 28/30.  She did not take her blood pressure medications yet.  She presents today for follow-up accompanied by her daughter.  HISTORY 07/25/2019 SS: [name] is an 85 year old female with history of meningioma status post resection.  She has not had any seizure events.  She is currently taking Topamax 50 mg twice a day and is tolerating the medication well.  In the past, she has had some trouble with memory, remembering names, but she did not want to switch anticonvulsant medications.  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 She had a CT scan in March 2019, showed no change, when compared to 2017.  She remains on Coumadin. She has a pacemaker. Her memory score was 29/30 today. She lives with her daughter. She doesn't drive a car. She as low back pain. She doesn't like to wear hearing aids. She denies recent falls.  She presents today for follow-up accompanied by her daughter.   REVIEW OF SYSTEMS: Out of a complete 14 system review of symptoms, the patient complains only of the  following symptoms, and all other reviewed systems are negative.  Memory loss, seizures   ALLERGIES: Allergies  Allergen Reactions  . Dilantin [Phenytoin Sodium Extended] Hives  . Phenobarbital Hives          HOME MEDICATIONS: Outpatient Medications Prior to Visit  Medication Sig Dispense Refill  . acetaminophen (TYLENOL) 500 MG tablet Take 500 mg by mouth daily as needed for moderate pain or headache.    . allopurinol (ZYLOPRIM) 100 MG tablet Take 100 mg by mouth daily.    [name] aspirin 81 MG EC tablet Adult Low Dose Aspirin 81 mg tablet,delayed release  Take 1 tablet every day by oral route.    . Carboxymethylcellul-Glycerin (LUBRICATING EYE DROPS OP) Place 1 drop into both eyes daily as needed (dry eyes).    . Cholecalciferol (DIALYVITE VITAMIN D 5000) 125 MCG (5000 UT) capsule Take 5,000 Units by mouth daily.    . Coenzyme Q10 10 MG capsule Co Q-10 10 mg capsule  Take 1 capsule every day by oral route.    . colchicine 0.6 MG tablet Take 1 tablet (0.6 mg total) by mouth daily as needed (gout flare). 20 tablet 0  . ergocalciferol (VITAMIN D2) 1.25 MG (50000 UT) capsule ergocalciferol (vitamin D2) 1,250 mcg (50,000 unit) capsule    . ferrous sulfate 325 (65 FE) MG tablet TAKE 1 TABLET (325 MG TOTAL) BY MOUTH EVERY MONDAY, WEDNESDAY, AND FRIDAY.  36 tablet 7  . KLOR-CON M10 10 MEQ tablet TAKE 1 TABLET (10 MEQ TOTAL) BY MOUTH 2 (TWO) TIMES DAILY. TAKE AN EXTRA 20 MEQ WITH FUROSEMIDE. (Patient taking differently: Take 20 mEq by mouth daily. ) 240 tablet 3  . metoprolol succinate (TOPROL-XL) 25 MG 24 hr tablet TAKE 1 TABLET BY MOUTH EVERY DAY 90 tablet 1  . neomycin-polymyxin-dexameth (MAXITROL) 0.1 % OINT neomycin 3.5 mg/g-polymyxin B 10,000 unit/g-dexameth 0.1 % eye oint    . nitroGLYCERIN (NITROLINGUAL) 0.4 MG/SPRAY spray Place 3 sprays under the tongue every 5 (five) minutes x 3 doses as needed for chest pain. 12 g 3  . omeprazole (PRILOSEC) 20 MG capsule Take 20 mg by mouth daily.    [name]  OVER THE COUNTER MEDICATION Place 6 drops under the tongue at bedtime as needed (anxiety/sleep). CBD oil    . Polyethyl Glycol-Propyl Glycol (SYSTANE OP) Place 1 drop into both eyes at bedtime as needed (dry eyes).    . potassium chloride (KLOR-CON) 10 MEQ tablet potassium chloride ER 10 mEq tablet,extended release    . pravastatin (PRAVACHOL) 20 MG tablet Take 20 mg by mouth at bedtime.    . topiramate (TOPAMAX) 50 MG tablet Take 50 mg by mouth 2 (two) times daily.    [name] torsemide (DEMADEX) 20 MG tablet Take 2 tablets (40 mg total) by mouth daily. 60 tablet 11  . traMADol (ULTRAM) 50 MG tablet Take 50 mg by mouth every 6 (six) hours as needed.    . triamcinolone acetonide (KENALOG) 40 MG/ML injection Kenalog 40 mg/mL suspension for injection  Inject 60 mg x 1    . verapamil (VERELAN PM) 240 MG 24 hr capsule TAKE 1 CAPSULE (240 MG TOTAL) BY MOUTH AT BEDTIME. 90 capsule 2  . warfarin (COUMADIN) 4 MG tablet Take 4 mg by mouth See admin instructions. Take 4 mg at night on Mon, Tue, Wed, Fri, and Sat    . warfarin (COUMADIN) 5 MG tablet Take 5 mg by mouth See admin instructions. Take 5 mg at night on Sun and Thur    . buPROPion (WELLBUTRIN SR) 100 MG 12 hr tablet Take 100 mg by mouth 2 (two) times daily.    . Influenza vac split quadrivalent PF (FLUZONE HIGH-DOSE) 0.5 ML injection Fluzone High-Dose 2019-20 (PF) 180 mcg/0.5 mL intramuscular syringe     No facility-administered medications prior to visit.    PAST MEDICAL HISTORY: Past Medical History:  Diagnosis Date  . ACL (anterior cruciate ligament) rupture    Left  . Acute MI ([place])   . Arthritis   . Back pain    Secondary to arthritis  . Benign paroxysmal positional vertigo 03/18/2016  . Brain tumor ([place])    On right side. Removed in 1989  . Bruit    Duplex doppler 03/28/05 Mildly abnormal. *Right subclavian artery: Less than 50% diameter reduction.  . Chest pain    2D Echo 04/21/08 EF = >55%. Moderate tricuspid regurgitation. Persantine  Myovoew stress test 04/21/08 EF = 81%, no evidence of ischemia noted.  . Chronic anticoagulation    PAF  . Chronic edema    Mild LE edema  . Chronic kidney disease    Mild, stage 2  . Coronary artery disease   . Coronary atherosclerosis    Minimal  . Dyslipidemia   . GERD (gastroesophageal reflux disease)   . Hyperlipidemia    On pravastatin  . Memory difficulty 07/13/2014  . Meningioma ([place])    Resection in 1998  .  Mild depression ([place])   . Ovarian cyst    Benign. Had a salpiingoophorectomy in 2005.  [name] PAF (paroxysmal atrial fibrillation) (H[facility])    On coumadin. St. Jude PPM (serial [id number]) inserted 2010  . Paroxysmal atrial flutter ([place])   . Seizure ([place]) 2006  . Severe obesity ([place])   . Sick sinus syndrome ([place])   . Sinus node dysfunction (H[facility])    S/P implantation of a dual-chamber permanent pacemaker in 2010  . SOB (shortness of breath) on exertion    Class II. Catheterization 04/30/10 showed mild CAD with mildly elevated right heart pressures.   . Tachycardia-bradycardia syndrome ([place])    S/P implantation of dual-chamber permanent pacemaker in 2010  . Trochlear nerve palsy    Right  . Vertigo     PAST SURGICAL HISTORY: Past Surgical History:  Procedure Laterality Date  . Brain tumor removal  1989  . CARDIAC CATHETERIZATION  04/30/10   Showed mild CAD with mildly elevated right heart pressures. (Dr. Elisabeth Cara)  . CRANIOTOMY  1989   (Brain tumor) With gamma knife procedure as well.  [name] KNEE SURGERY     Bilateral, arthroscopic  . PACEMAKER INSERTION  04/27/09   Implanted by Dr. Elisabeth Cara. [place] (serial [id number])  . PPM GENERATOR CHANGEOUT N/A 09/12/2019   Procedure: PPM GENERATOR CHANGEOUT;  Surgeon: Sanda Klein, MD;  Location: [facility];  Service: Cardiovascular;  Laterality: N/A;    FAMILY HISTORY: Family History  Problem Relation Age of Onset  . Stroke Mother   . Heart disease Father   . Heart disease Sister   . Heart disease Brother   .  Heart disease Brother   . Heart disease Brother   . Heart disease Sister   . Heart disease Sister     SOCIAL HISTORY: Social History   Socioeconomic History  . Marital status: Divorced    Spouse name: Not on file  . Number of children: 3  . Years of education: Not on file  . Highest education level: Not on file  Occupational History    Comment: Retired  Tobacco Use  . Smoking status: Former Smoker    Years: 20.00    Types: Cigarettes    Quit date: 12/23/2007    Years since quitting: 12.1  . Smokeless tobacco: Never Used  Substance and Sexual Activity  . Alcohol use: No    Alcohol/week: 0.0 standard drinks  . Drug use: No  . Sexual activity: Not on file  Other Topics Concern  . Not on file  Social History Narrative  . Not on file   Social Determinants of Health   Financial Resource Strain:   . Difficulty of Paying Living Expenses: Not on file  Food Insecurity:   . Worried About Charity fundraiser in the Last Year: Not on file  . Ran Out of Food in the Last Year: Not on file  Transportation Needs:   . Lack of Transportation (Medical): Not on file  . Lack of Transportation (Non-Medical): Not on file  Physical Activity:   . Days of Exercise per Week: Not on file  . Minutes of Exercise per Session: Not on file  Stress:   . Feeling of Stress : Not on file  Social Connections:   . Frequency of Communication with Friends and Family: Not on file  . Frequency of Social Gatherings with Friends and Family: Not on file  . Attends Religious Services: Not on file  . Active Member of Clubs  or Organizations: Not on file  . Attends Archivist Meetings: Not on file  . Marital Status: Not on file  Intimate Partner Violence:   . Fear of Current or Ex-Partner: Not on file  . Emotionally Abused: Not on file  . Physically Abused: Not on file  . Sexually Abused: Not on file   PHYSICAL EXAM  Vitals:   02/23/20 0818  BP: (!) 176/89  Pulse: 68  Weight: 239 lb (108.4  kg)  Height: 5\' 5"  (1.651 m)   Body mass index is 39.77 kg/m.  Generalized: Well developed, in no acute distress  MMSE - Mini Mental State Exam 02/23/2020 07/25/2019 02/03/2017  Orientation to time 5 4 4   Orientation to Place 4 5 4   Registration 3 3 3   Attention/ Calculation 5 5 5   Attention/Calculation-comments declined serial 7s - -  Recall 2 3 3   Language- name 2 objects 2 2 2   Language- repeat 1 1 1   Language- follow 3 step command 3 3 3   Language- read & follow direction 1 1 1   Write a sentence 1 1 1   Copy design 1 1 1   Copy design-comments - named 9 animals -  Total score 28 29 28     Neurological examination  Mentation: Alert oriented to time, place, history taking. Follows all commands speech and language fluent, smiling, pleasant, participatory Cranial nerve II-XII: Pupils were equal round reactive to light. Extraocular movements were full, visual field were full on confrontational test, reports double vision on the left. Facial sensation and strength were normal. Head turning and shoulder shrug were normal and symmetric. Motor: Good strength of all extremities Sensory: Sensory testing is intact to soft touch on all 4 extremities. No evidence of extinction is noted.  Coordination: Cerebellar testing reveals good finger-nose-finger bilaterally, difficulty performing heel-to-shin due to orthopedic limitation Gait and station: Slow to rise from seated position, has to push off, gait is wide-based, cautious, slow, uses a single-point cane to ambulate Reflexes: Deep tendon reflexes are symmetric   DIAGNOSTIC DATA (LABS, IMAGING, TESTING) - I reviewed patient records, labs, notes, testing and imaging myself where available.  Lab Results  Component Value Date   WBC 5.2 09/06/2019   HGB 14.1 09/06/2019   HCT 42.7 09/06/2019   MCV 86 09/06/2019   PLT 200 09/06/2019      Component Value Date/Time   NA 145 (H) 09/06/2019 1214   K 4.0 09/06/2019 1214   CL 111 (H) 09/06/2019  1214   CO2 17 (L) 09/06/2019 1214   GLUCOSE 89 09/06/2019 1214   GLUCOSE 134 (H) 02/07/2016 1051   BUN 30 (H) 09/06/2019 1214   CREATININE 1.23 (H) 09/06/2019 1214   CREATININE 1.54 (H) 02/07/2016 1051   CALCIUM 9.5 09/06/2019 1214   PROT 7.0 04/23/2009 1930   ALBUMIN 3.2 (L) 04/23/2009 1930   AST 19 04/23/2009 1930   ALT 12 04/23/2009 1930   ALKPHOS 71 04/23/2009 1930   BILITOT 0.6 04/23/2009 1930   GFRNONAA 40 (L) 09/06/2019 1214   GFRAA 47 (L) 09/06/2019 1214   No results found for: CHOL, HDL, LDLCALC, LDLDIRECT, TRIG, CHOLHDL No results found for: HGBA1C No results found for: [place] PLAN 86 y.o. year old female  has a past medical history of ACL (anterior cruciate ligament) rupture, Acute MI ([place]), Arthritis, Back pain, Benign paroxysmal positional vertigo (03/18/2016), Brain tumor ([place]), Bruit, Chest pain, Chronic anticoagulation, Chronic edema, Chronic kidney disease, Coronary artery disease, Coronary atherosclerosis, Dyslipidemia, GERD (  gastroesophageal reflux disease), Hyperlipidemia, Memory difficulty (07/13/2014), Meningioma ([place]), Mild depression ([place]), Ovarian cyst, PAF (paroxysmal atrial fibrillation) ([place]), Paroxysmal atrial flutter ([place]), Seizure ([place]) (2006), Severe obesity ([place]), Sick sinus syndrome ([place]), Sinus node dysfunction (H[facility]), SOB (shortness of breath) on exertion, Tachycardia-bradycardia syndrome ([place]), Trochlear nerve palsy, and Vertigo. here with:  1.  History of multiple meningiomas 2.  History of seizures, well controlled 3.  Gait disturbance 4.  Mild memory disturbance  She has remained stable, has reported some mild headaches occasionally in the afternoon.  We discussed repeating CT head, due to history of meningioma, status post resection.  Historically, we have been repeating about every 2 to 3 years.  She got a new pacemaker, that she thinks is MRI compatible.  However, says she does not to put herself through an MRI.  If we did  anything, it would be CT head, but she is not sure she even wants this.  She is going to think about it, let me know.  She will remain on Topamax 50 mg twice a day (filled by PCP).  Her memory has remained stable, 28/30.  She will follow-up in 8 months or sooner if needed.  I spent 15 minutes with the patient. 50% of this time was spent discussing her plan of care.   Butler Denmark, AGNP-C, DNP 02/23/2020, 8:56 AM [facility] [address] [place], [place] [address] [phone]

## 2020-02-23 NOTE — Progress Notes (Signed)
I have read the note, and I agree with the clinical assessment and plan.  [name]   

## 2020-03-09 ENCOUNTER — Ambulatory Visit (INDEPENDENT_AMBULATORY_CARE_PROVIDER_SITE_OTHER): Payer: Medicare Other | Admitting: *Deleted

## 2020-03-09 ENCOUNTER — Telehealth: Payer: Self-pay | Admitting: Student

## 2020-03-09 DIAGNOSIS — I495 Sick sinus syndrome: Secondary | ICD-10-CM | POA: Diagnosis not present

## 2020-03-09 LAB — CUP PACEART REMOTE DEVICE CHECK
Battery Remaining Longevity: 94 mo
Battery Remaining Percentage: 95.5 %
Battery Voltage: 3.01 V
Brady Statistic AP VP Percent: 1 %
Brady Statistic AP VS Percent: 96 %
Brady Statistic AS VP Percent: 1 %
Brady Statistic AS VS Percent: 3.5 %
Brady Statistic RA Percent Paced: 88 %
Brady Statistic RV Percent Paced: 3.8 %
Date Time Interrogation Session: 20210319020016
Implantable Lead Implant Date: 20100507
Implantable Lead Implant Date: 20100507
Implantable Lead Location: 753859
Implantable Lead Location: 753860
Implantable Pulse Generator Implant Date: 20200921
Lead Channel Impedance Value: 350 Ohm
Lead Channel Impedance Value: 490 Ohm
Lead Channel Pacing Threshold Amplitude: 0.5 V
Lead Channel Pacing Threshold Amplitude: 3.25 V
Lead Channel Pacing Threshold Pulse Width: 0.5 ms
Lead Channel Pacing Threshold Pulse Width: 0.5 ms
Lead Channel Sensing Intrinsic Amplitude: 12 mV
Lead Channel Sensing Intrinsic Amplitude: 4.2 mV
Lead Channel Setting Pacing Amplitude: 1.5 V
Lead Channel Setting Pacing Amplitude: 3.5 V
Lead Channel Setting Pacing Pulse Width: 0.5 ms
Lead Channel Setting Sensing Sensitivity: 4 mV
Pulse Gen Model: 2272
Pulse Gen Serial Number: 9166074

## 2020-03-09 NOTE — Progress Notes (Signed)
PPM Remote  

## 2020-03-09 NOTE — Telephone Encounter (Signed)
   Scheduled remote with elevated auto RV capture threshold which appears to be due to fusion.with intrinsic R waves.  Need to assess RV threshold in person and possible program auto capture off. Appt made for next Thursday at 0900.   Questions answered.   Legrand Como [address] [place], PA-C  03/09/2020 11:14 AM

## 2020-03-12 ENCOUNTER — Telehealth: Payer: Self-pay | Admitting: Cardiovascular Disease

## 2020-03-12 NOTE — Telephone Encounter (Signed)
The patient has been made aware that this will be noted for the appointment.

## 2020-03-12 NOTE — Telephone Encounter (Signed)
New Message    Pt is calling and says her daughter will need to assist her to her appt because she doesn't walk well    Please advise

## 2020-03-15 ENCOUNTER — Other Ambulatory Visit: Payer: Self-pay

## 2020-03-15 ENCOUNTER — Ambulatory Visit (INDEPENDENT_AMBULATORY_CARE_PROVIDER_SITE_OTHER): Payer: Medicare Other | Admitting: *Deleted

## 2020-03-15 DIAGNOSIS — I495 Sick sinus syndrome: Secondary | ICD-10-CM

## 2020-03-15 LAB — CUP PACEART INCLINIC DEVICE CHECK
Battery Remaining Longevity: 111 mo
Battery Voltage: 3.01 V
Brady Statistic RA Percent Paced: 88 %
Brady Statistic RV Percent Paced: 3.7 %
Date Time Interrogation Session: 20210325091700
Implantable Lead Implant Date: 20100507
Implantable Lead Implant Date: 20100507
Implantable Lead Location: 753859
Implantable Lead Location: 753860
Implantable Pulse Generator Implant Date: 20200921
Lead Channel Impedance Value: 375 Ohm
Lead Channel Impedance Value: 512.5 Ohm
Lead Channel Pacing Threshold Amplitude: 0.625 V
Lead Channel Pacing Threshold Amplitude: 1 V
Lead Channel Pacing Threshold Pulse Width: 0.5 ms
Lead Channel Pacing Threshold Pulse Width: 0.5 ms
Lead Channel Sensing Intrinsic Amplitude: 12 mV
Lead Channel Sensing Intrinsic Amplitude: 4.8 mV
Lead Channel Setting Pacing Amplitude: 1.625
Lead Channel Setting Pacing Amplitude: 2.5 V
Lead Channel Setting Pacing Pulse Width: 0.5 ms
Lead Channel Setting Sensing Sensitivity: 4 mV
Pulse Gen Model: 2272
Pulse Gen Serial Number: 9166074

## 2020-03-15 NOTE — Progress Notes (Signed)
Pacemaker check in clinic. Normal device function. Thresholds, sensing, impedances consistent with previous measurements. Device programmed to maximize longevity. At /AF burden 8.8%,1245  AMS episodes with 25 EGMs that appear to show AF with controlled v-rates with longest episode lasting 2 days, 11 minutes, 36 seconds, + Coumadin.No  high ventricular rates noted. Device programmed at appropriate safety margins. Brought in to assess RV threshold which was WNL. RV Autocapture programmed off. Histogram distribution appropriate for patient activity level. Device programmed to optimize intrinsic conduction. Estimated longevity 9 yrs 3 months. Patient enrolled in remote follow-up and next remote 06/08/20. Patient education completed.Due to patient reporting decreased energy levels, will forward to Dr Recardo Evangelist.

## 2020-03-23 ENCOUNTER — Other Ambulatory Visit: Payer: Self-pay | Admitting: Cardiovascular Disease

## 2020-03-30 ENCOUNTER — Other Ambulatory Visit: Payer: Self-pay

## 2020-03-30 ENCOUNTER — Emergency Department (HOSPITAL_COMMUNITY): Payer: Medicare Other

## 2020-03-30 ENCOUNTER — Encounter (HOSPITAL_COMMUNITY): Payer: Self-pay | Admitting: *Deleted

## 2020-03-30 ENCOUNTER — Emergency Department (HOSPITAL_COMMUNITY)
Admission: EM | Admit: 2020-03-30 | Discharge: 2020-03-30 | Disposition: A | Discharge status: Home / Self Care | Payer: Medicare Other | Attending: Emergency Medicine | Admitting: Emergency Medicine

## 2020-03-30 DIAGNOSIS — N183 Chronic kidney disease, stage 3 unspecified: Secondary | ICD-10-CM | POA: Insufficient documentation

## 2020-03-30 DIAGNOSIS — Z79899 Other long term (current) drug therapy: Secondary | ICD-10-CM | POA: Diagnosis not present

## 2020-03-30 DIAGNOSIS — I5032 Chronic diastolic (congestive) heart failure: Secondary | ICD-10-CM | POA: Diagnosis not present

## 2020-03-30 DIAGNOSIS — I130 Hypertensive heart and chronic kidney disease with heart failure and stage 1 through stage 4 chronic kidney disease, or unspecified chronic kidney disease: Secondary | ICD-10-CM | POA: Insufficient documentation

## 2020-03-30 DIAGNOSIS — Z87891 Personal history of nicotine dependence: Secondary | ICD-10-CM | POA: Diagnosis not present

## 2020-03-30 DIAGNOSIS — Z7901 Long term (current) use of anticoagulants: Secondary | ICD-10-CM | POA: Diagnosis not present

## 2020-03-30 DIAGNOSIS — R5381 Other malaise: Secondary | ICD-10-CM | POA: Diagnosis not present

## 2020-03-30 DIAGNOSIS — R0602 Shortness of breath: Secondary | ICD-10-CM | POA: Diagnosis present

## 2020-03-30 LAB — URINALYSIS, ROUTINE W REFLEX MICROSCOPIC
Bilirubin Urine: NEGATIVE
Glucose, UA: NEGATIVE mg/dL
Hgb urine dipstick: NEGATIVE
Ketones, ur: NEGATIVE mg/dL
Leukocytes,Ua: NEGATIVE
Nitrite: NEGATIVE
Protein, ur: NEGATIVE mg/dL
Specific Gravity, Urine: 1.016 (ref 1.005–1.030)
pH: 5 (ref 5.0–8.0)

## 2020-03-30 LAB — COMPREHENSIVE METABOLIC PANEL
ALT: 21 U/L (ref 0–44)
AST: 25 U/L (ref 15–41)
Albumin: 3.7 g/dL (ref 3.5–5.0)
Alkaline Phosphatase: 73 U/L (ref 38–126)
Anion gap: 9 (ref 5–15)
BUN: 35 mg/dL — ABNORMAL HIGH (ref 8–23)
CO2: 21 mmol/L — ABNORMAL LOW (ref 22–32)
Calcium: 10.2 mg/dL (ref 8.9–10.3)
Chloride: 111 mmol/L (ref 98–111)
Creatinine, Ser: 1.31 mg/dL — ABNORMAL HIGH (ref 0.44–1.00)
GFR calc Af Amer: 43 mL/min — ABNORMAL LOW (ref >60)
GFR calc non Af Amer: 37 mL/min — ABNORMAL LOW (ref >60)
Glucose, Bld: 103 mg/dL — ABNORMAL HIGH (ref 70–99)
Potassium: 5 mmol/L (ref 3.5–5.1)
Sodium: 141 mmol/L (ref 135–145)
Total Bilirubin: 0.5 mg/dL (ref 0.3–1.2)
Total Protein: 6.7 g/dL (ref 6.5–8.1)

## 2020-03-30 LAB — CBC WITH DIFFERENTIAL/PLATELET
Abs Immature Granulocytes: 0.03 10*3/uL (ref 0.00–0.07)
Basophils Absolute: 0.1 10*3/uL (ref 0.0–0.1)
Basophils Relative: 1 %
Eosinophils Absolute: 0.1 10*3/uL (ref 0.0–0.5)
Eosinophils Relative: 1 %
HCT: 50.6 % — ABNORMAL HIGH (ref 36.0–46.0)
Hemoglobin: 14.7 g/dL (ref 12.0–15.0)
Immature Granulocytes: 0 %
Lymphocytes Relative: 29 %
Lymphs Abs: 1.9 10*3/uL (ref 0.7–4.0)
MCH: 28.3 pg (ref 26.0–34.0)
MCHC: 29.1 g/dL — ABNORMAL LOW (ref 30.0–36.0)
MCV: 97.3 fL (ref 80.0–100.0)
Monocytes Absolute: 0.6 10*3/uL (ref 0.1–1.0)
Monocytes Relative: 10 %
Neutro Abs: 4 10*3/uL (ref 1.7–7.7)
Neutrophils Relative %: 59 %
Platelets: 178 10*3/uL (ref 150–400)
RBC: 5.2 MIL/uL — ABNORMAL HIGH (ref 3.87–5.11)
RDW: 15.2 % (ref 11.5–15.5)
WBC: 6.8 10*3/uL (ref 4.0–10.5)
nRBC: 0 % (ref 0.0–0.2)

## 2020-03-30 NOTE — ED Notes (Signed)
Attempted to obtain labs x1. Pt is difficult stick.

## 2020-03-30 NOTE — ED Notes (Signed)
Pacemaker report received by fax, Given to Eulis Foster, MD

## 2020-03-30 NOTE — Discharge Instructions (Addendum)
Testing today did not show any serious problems.  You may be a little bit dehydrated, causing your discomfort.  Try to drink a little more water each day and make sure you are eating 3 meals.  See your doctor for checkup in a few days to a week so she can evaluate you for additional problems.

## 2020-03-30 NOTE — ED Provider Notes (Signed)
[facility] DEPT Provider Note   CSN: [id number] Arrival date & time: 03/30/20  1438     History Chief Complaint  Patient presents with  . Shortness of Breath    [name] is a 86 y.o. female.  HPI She presents for evaluation of shortness of breath which started several days ago.  She also states that today she "just did not feel right."  She has chronic ongoing problems including double vision, occasional headache, after having a "brain tumor operated on."  She has a cardiac pacemaker and recently had a battery swap.  She denies fever, chills, cough, chest pain, focal weakness or paresthesia.  There are no other known modifying factors.    Past Medical History:  Diagnosis Date  . ACL (anterior cruciate ligament) rupture    Left  . Acute MI ([place])   . Arthritis   . Back pain    Secondary to arthritis  . Benign paroxysmal positional vertigo 03/18/2016  . Brain tumor ([place])    On right side. Removed in 1989  . Bruit    Duplex doppler 03/28/05 Mildly abnormal. *Right subclavian artery: Less than 50% diameter reduction.  . Chest pain    2D Echo 04/21/08 EF = >55%. Moderate tricuspid regurgitation. Persantine Myovoew stress test 04/21/08 EF = 81%, no evidence of ischemia noted.  . Chronic anticoagulation    PAF  . Chronic edema    Mild LE edema  . Chronic kidney disease    Mild, stage 2  . Coronary artery disease   . Coronary atherosclerosis    Minimal  . Dyslipidemia   . GERD (gastroesophageal reflux disease)   . Hyperlipidemia    On pravastatin  . Memory difficulty 07/13/2014  . Meningioma ([place])    Resection in 1998  . Mild depression ([place])   . Ovarian cyst    Benign. Had a salpiingoophorectomy in 2005.  [name] PAF (paroxysmal atrial fibrillation) (HCC)    On coumadin. St. Jude PPM (serial [id number]) inserted 2010  . Paroxysmal atrial flutter ([place])   . Seizure ([place]) 2006  . Severe obesity ([place])   . Sick sinus syndrome ([place])   . Sinus node  dysfunction (HCC)    S/P implantation of a dual-chamber permanent pacemaker in 2010  . SOB (shortness of breath) on exertion    Class II. Catheterization 04/30/10 showed mild CAD with mildly elevated right heart pressures.   . Tachycardia-bradycardia syndrome ([place])    S/P implantation of dual-chamber permanent pacemaker in 2010  . Trochlear nerve palsy    Right  . Vertigo     Patient Active Problem List   Diagnosis Date Noted  . Pacemaker battery depletion 09/12/2019  . Acute on chronic diastolic heart failure ([place]) 11/17/2018  . History of anticoagulant therapy 07/12/2018  . Abnormal C-reactive protein 07/12/2018  . Gastroesophageal reflux disease without esophagitis 07/12/2018  . History of cerebral meningioma 07/12/2018  . Hyperlipidemia 07/12/2018  . SSS (sick sinus syndrome) ([place]) 06/04/2016  . Meningioma, multiple ([place]) 03/18/2016  . Benign paroxysmal positional vertigo 03/18/2016  . Shoulder pain 10/10/2015  . Blood glucose abnormal 09/19/2015  . Gouty arthropathy 09/19/2015  . Vitamin D deficiency 09/19/2015  . Exertional shortness of breath 04/10/2015  . Memory difficulty 07/13/2014  . Angina, class III ([place]) 06/06/2014  . CAD (coronary artery disease) 06/06/2014  . Gout flare of right elbow 06/06/2014  . HTN (hypertension) 08/30/2013  . CKD (chronic kidney disease) stage 3, GFR 30-59 ml/min (HCC) 08/30/2013  .  Seizure ([place]) 08/30/2013  . Obesity (BMI 30.0-34.9) 08/30/2013  . Pacemaker - dual chamber St. Jude RF device, implanted May 2010 08/30/2013  . Paroxysmal atrial fibrillation ([place]) 03/08/2013  . Long term current use of anticoagulant therapy 03/08/2013    Past Surgical History:  Procedure Laterality Date  . Brain tumor removal  1989  . CARDIAC CATHETERIZATION  04/30/10   Showed mild CAD with mildly elevated right heart pressures. (Dr. Elisabeth Cara)  . CRANIOTOMY  1989   (Brain tumor) With gamma knife procedure as well.  [name] KNEE SURGERY     Bilateral,  arthroscopic  . PACEMAKER INSERTION  04/27/09   Implanted by Dr. Elisabeth Cara. [place] (serial [id number])  . PPM GENERATOR CHANGEOUT N/A 09/12/2019   Procedure: PPM GENERATOR CHANGEOUT;  Surgeon: Sanda Klein, MD;  Location: [facility];  Service: Cardiovascular;  Laterality: N/A;     OB History   No obstetric history on file.     Family History  Problem Relation Age of Onset  . Stroke Mother   . Heart disease Father   . Heart disease Sister   . Heart disease Brother   . Heart disease Brother   . Heart disease Brother   . Heart disease Sister   . Heart disease Sister     Social History   Tobacco Use  . Smoking status: Former Smoker    Years: 20.00    Types: Cigarettes    Quit date: 12/23/2007    Years since quitting: 12.2  . Smokeless tobacco: Never Used  Substance Use Topics  . Alcohol use: No    Alcohol/week: 0.0 standard drinks  . Drug use: No    Home Medications Prior to Admission medications   Medication Sig Start Date End Date Taking? Authorizing Provider  acetaminophen (TYLENOL) 500 MG tablet Take 500 mg by mouth daily as needed for moderate pain or headache.   Yes [provider]  allopurinol (ZYLOPRIM) 100 MG tablet Take 100 mg by mouth daily.   Yes [provider]  Carboxymethylcellul-Glycerin (LUBRICATING EYE DROPS OP) Place 1 drop into both eyes daily as needed (dry eyes).   Yes [provider]  colchicine 0.6 MG tablet Take 1 tablet (0.6 mg total) by mouth daily as needed (gout flare). 12/12/19  Yes Croitoru, Mihai, MD  ferrous sulfate 325 (65 FE) MG tablet TAKE 1 TABLET (325 MG TOTAL) BY MOUTH EVERY MONDAY, WEDNESDAY, AND FRIDAY. 10/05/19  Yes Croitoru, Mihai, MD  KLOR-CON M10 10 MEQ tablet TAKE 1 TABLET (10 MEQ TOTAL) BY MOUTH 2 (TWO) TIMES DAILY. TAKE AN EXTRA 20 MEQ WITH FUROSEMIDE. Patient taking differently: Take 20 mEq by mouth 2 (two) times daily.  09/05/19  Yes Croitoru, Mihai, MD  metoprolol succinate  (TOPROL-XL) 25 MG 24 hr tablet TAKE 1 TABLET BY MOUTH EVERY DAY Patient taking differently: Take 25 mg by mouth daily.  03/23/20  Yes Croitoru, Mihai, MD  nitroGLYCERIN (NITROLINGUAL) 0.4 MG/SPRAY spray Place 3 sprays under the tongue every 5 (five) minutes x 3 doses as needed for chest pain. 05/20/18  Yes Croitoru, Mihai, MD  omeprazole (PRILOSEC) 20 MG capsule Take 20 mg by mouth daily.   Yes [provider]  Polyethyl Glycol-Propyl Glycol (SYSTANE OP) Place 1 drop into both eyes at bedtime as needed (dry eyes).   Yes [provider]  pravastatin (PRAVACHOL) 20 MG tablet Take 20 mg by mouth at bedtime.   Yes [provider]  topiramate (TOPAMAX) 50 MG tablet Take 50  mg by mouth 2 (two) times daily.   Yes [provider]  torsemide (DEMADEX) 20 MG tablet Take 2 tablets (40 mg total) by mouth daily. 12/12/19  Yes Croitoru, Mihai, MD  traMADol (ULTRAM) 50 MG tablet Take 50 mg by mouth at bedtime as needed for moderate pain (sleep).  01/05/20  Yes [provider]  verapamil (VERELAN PM) 240 MG 24 hr capsule TAKE 1 CAPSULE (240 MG TOTAL) BY MOUTH AT BEDTIME. 02/21/20  Yes Croitoru, Mihai, MD  warfarin (COUMADIN) 4 MG tablet Take 4 mg by mouth See admin instructions. Take 4 mg at night on Mon, Tue, Wed, Fri, and Sat   Yes [provider]  warfarin (COUMADIN) 5 MG tablet Take 5 mg by mouth See admin instructions. Take 5 mg at night on Sun and Thur   Yes [provider]  OVER THE COUNTER MEDICATION Place 6 drops under the tongue at bedtime as needed (anxiety/sleep). CBD oil    [provider]    Allergies    Dilantin [phenytoin sodium extended] and Phenobarbital  Review of Systems   Review of Systems  All other systems reviewed and are negative.   Physical Exam Updated Vital Signs BP (!) 175/51   Pulse 62   Temp 98.8 F (37.1 C) (Oral)   Resp 19   Ht 5\' 6"  (1.676 m)   Wt 107 kg   SpO2 99%   BMI 38.09 kg/m   Physical  Exam Vitals and nursing note reviewed.  Constitutional:      General: She is not in acute distress.    Appearance: She is well-developed. She is obese. She is not ill-appearing, toxic-appearing or diaphoretic.  HENT:     Head: Normocephalic and atraumatic.     Right Ear: External ear normal.     Left Ear: External ear normal.  Eyes:     Conjunctiva/sclera: Conjunctivae normal.     Pupils: Pupils are equal, round, and reactive to light.  Neck:     Trachea: Phonation normal.  Cardiovascular:     Rate and Rhythm: Normal rate and regular rhythm.     Heart sounds: Normal heart sounds.  Pulmonary:     Effort: Pulmonary effort is normal.     Breath sounds: Normal breath sounds.  Abdominal:     General: There is no distension.     Palpations: Abdomen is soft.     Tenderness: There is no abdominal tenderness. There is no guarding.  Musculoskeletal:        General: Normal range of motion.     Cervical back: Normal range of motion and neck supple.  Skin:    General: Skin is warm and dry.  Neurological:     Mental Status: She is alert and oriented to person, place, and time.     Cranial Nerves: No cranial nerve deficit.     Sensory: No sensory deficit.     Motor: No abnormal muscle tone.     Coordination: Coordination normal.     Comments: No dysarthria, aphasia or nystagmus.  Psychiatric:        Mood and Affect: Mood normal.        Behavior: Behavior normal.        Thought Content: Thought content normal.        Judgment: Judgment normal.     ED Results / Procedures / Treatments   Labs (all labs ordered are listed, but only abnormal results are displayed) Labs Reviewed  COMPREHENSIVE METABOLIC PANEL -  Abnormal; Notable for the following components:      Result Value   CO2 21 (*)    Glucose, Bld 103 (*)    BUN 35 (*)    Creatinine, Ser 1.31 (*)    GFR calc non Af Amer 37 (*)    GFR calc Af Amer 43 (*)    All other components within normal limits  CBC WITH  DIFFERENTIAL/PLATELET - Abnormal; Notable for the following components:   RBC 5.20 (*)    HCT 50.6 (*)    MCHC 29.1 (*)    All other components within normal limits  URINALYSIS, ROUTINE W REFLEX MICROSCOPIC    EKG None  Radiology DG Chest 2 View  Result Date: 03/30/2020 CLINICAL DATA:  Fifth shortness of breath EXAM: CHEST - 2 VIEW COMPARISON:  06/06/2014 FINDINGS: Left pacer remains in place, unchanged. Heart and mediastinal contours are within normal limits. No focal opacities or effusions. No acute bony abnormality. IMPRESSION: No active cardiopulmonary disease. Electronically Signed   By: Rolm Baptise M.D.   On: 03/30/2020 15:19    Procedures Procedures (including critical care time)  Medications Ordered in ED Medications - No data to display  ED Course  I have reviewed the triage vital signs and the nursing notes.  Pertinent labs & imaging results that were available during my care of the patient were reviewed by me and considered in my medical decision making (see chart for details).  Clinical Course as of Mar 31 2323  Fri Mar 30, 2020  1916 No infiltrate or CHF, interpreted by me  DG Chest 2 View [EW]  2314 Normal  Urinalysis, Routine w reflex microscopic [EW]  2314 Normal except CO2 low, glucose high, BUN high, creatinine high, GFR low  Comprehensive metabolic panel(!) [EW]  99991111 Normal except hematocrit elevated  CBC with Differential(!) [EW]  2315 No infiltrate or CHF, interpreted by me  DG Chest 2 View [EW]  2316 Central pacemaker was interrogated and shows stable chronic status with intermittent episodes of atrial fibrillation.  No malignant arrhythmias.   [EW]    Clinical Course User Index [EW] Daleen Bo, MD   MDM Rules/Calculators/A&P                       Patient Vitals for the past 24 hrs:  BP Temp Temp src Pulse Resp SpO2 Height Weight  03/30/20 2204 (!) 175/51 -- -- 62 19 99 % -- --  03/30/20 2130 -- -- -- 65 19 99 % -- --  03/30/20 1930 (!)  159/68 -- -- 66 19 91 % -- --  03/30/20 1844 (!) 184/78 98.8 F (37.1 C) Oral 70 16 94 % -- --  03/30/20 1450 -- -- -- -- -- -- 5\' 6"  (1.676 m) 107 kg  03/30/20 1447 (!) 166/71 98.5 F (36.9 C) Oral 69 (!) 21 94 % -- --    11:24 PM Reevaluation with update and discussion. After initial assessment and treatment, an updated evaluation reveals she remains comfortable has no further complaints. Daleen Bo   Medical Decision Making:  This patient is presenting for evaluation of nonspecific symptoms, which does require a range of treatment options, and is a complaint that involves a low to moderate risk of morbidity and mortality. The differential diagnoses include infections, metabolic instability and cardiac disorders. I decided decided to review old records, and in summary patient has a pacemaker, for sick sinus syndrome and history of atrial fibrillation.  She has multiple stable  chronic illnesses. I obtained additional historical information from her daughter. Clinical Laboratory Tests Ordered, included CBC, sed rate, urinalysis. Radiologic Tests Ordered, included chest x-ray. I independently Visualized: Radiologic images, which show no acute changes   Critical Interventions-observation  After These Interventions, the Patient was reevaluated and was found stable without change.  CRITICAL CARE-no Performed by: Daleen Bo   Nursing Notes Reviewed/ Care Coordinated Applicable Imaging Reviewed Interpretation of Laboratory Data incorporated into ED treatment  The patient appears reasonably screened and/or stabilized for discharge and I doubt any other medical condition or other [facility] requiring further screening, evaluation, or treatment in the ED at this time prior to discharge.  Plan: Home Medications-continue current; Home Treatments-try to drink more fluids; return here if the recommended treatment, does not improve the symptoms; Recommended follow up-PCP, 1 week    Final  Clinical Impression(s) / ED Diagnoses Final diagnoses:  Malaise    Rx / DC Orders ED Discharge Orders    None       Daleen Bo, MD 03/30/20 2327

## 2020-03-30 NOTE — ED Triage Notes (Signed)
SHOB started a couple of days ago. Pt has chronic back pain, Pt requesting a CT scan of her head.

## 2020-03-30 NOTE — ED Notes (Signed)
attempting to integrate pacemaker calling device support to get report.

## 2020-04-24 ENCOUNTER — Other Ambulatory Visit: Payer: Self-pay | Admitting: Cardiovascular Disease

## 2020-04-30 ENCOUNTER — Other Ambulatory Visit: Payer: Self-pay

## 2020-04-30 ENCOUNTER — Encounter: Payer: Self-pay | Admitting: Podiatry

## 2020-04-30 ENCOUNTER — Ambulatory Visit: Payer: Medicare Other | Admitting: Podiatry

## 2020-04-30 ENCOUNTER — Encounter: Payer: Self-pay | Admitting: Cardiovascular Disease

## 2020-04-30 ENCOUNTER — Ambulatory Visit (INDEPENDENT_AMBULATORY_CARE_PROVIDER_SITE_OTHER): Payer: Medicare Other | Admitting: Cardiovascular Disease

## 2020-04-30 VITALS — BP 127/81 | HR 83 | Ht 64.0 in | Wt 238.0 lb

## 2020-04-30 VITALS — Temp 96.9°F

## 2020-04-30 DIAGNOSIS — Z7901 Long term (current) use of anticoagulants: Secondary | ICD-10-CM

## 2020-04-30 DIAGNOSIS — M79674 Pain in right toe(s): Secondary | ICD-10-CM | POA: Diagnosis not present

## 2020-04-30 DIAGNOSIS — E7800 Pure hypercholesterolemia, unspecified: Secondary | ICD-10-CM

## 2020-04-30 DIAGNOSIS — I5032 Chronic diastolic (congestive) heart failure: Secondary | ICD-10-CM | POA: Diagnosis not present

## 2020-04-30 DIAGNOSIS — M79675 Pain in left toe(s): Secondary | ICD-10-CM

## 2020-04-30 DIAGNOSIS — I480 Paroxysmal atrial fibrillation: Secondary | ICD-10-CM | POA: Diagnosis not present

## 2020-04-30 DIAGNOSIS — N1832 Chronic kidney disease, stage 3b: Secondary | ICD-10-CM

## 2020-04-30 DIAGNOSIS — I2510 Atherosclerotic heart disease of native coronary artery without angina pectoris: Secondary | ICD-10-CM

## 2020-04-30 DIAGNOSIS — Z950 Presence of cardiac pacemaker: Secondary | ICD-10-CM

## 2020-04-30 DIAGNOSIS — I10 Essential (primary) hypertension: Secondary | ICD-10-CM

## 2020-04-30 DIAGNOSIS — I495 Sick sinus syndrome: Secondary | ICD-10-CM

## 2020-04-30 DIAGNOSIS — M109 Gout, unspecified: Secondary | ICD-10-CM

## 2020-04-30 DIAGNOSIS — B351 Tinea unguium: Secondary | ICD-10-CM | POA: Diagnosis not present

## 2020-04-30 NOTE — Progress Notes (Signed)
Cardiology Office Note    Date:  05/01/2020   ID:  [name], DOB [birth year], MRN [id number]  PCP:  Willey Blade, MD  Cardiologist:   Sanda Klein, MD   Chief Complaint  Patient presents with  . Congestive Heart Failure  . Atrial Fibrillation  . Pacemaker Check    History of Present Illness:  [name] is a 86 y.o. female here for follow-up of her dual-chamber permanent pacemaker. The device was implanted in 2010 for tachycardia-bradycardia syndrome (paroxysmal atrial tachycardia, paroxysmal atrial fibrillation, symptomatic sinus bradycardia) and she underwent a generator change out in September 2020.  She is (as almost at all appointments) accompanied by her daughter.  As before, her daughter is upset with the fact that the patient does not exercise and eats an unhealthy diet.  [name] does not have active cardiovascular complaints, but is mostly preoccupied by pain in her right shoulder which she cannot lift up actively.  It seems she has a rotator cuff tear.  Her weight is similar to her last appointment of just around 240 pounds.  She has very little pedal edema.  She has not required diuretic dose adjustments.  She denies orthopnea or PND and has not had chest pain either at rest or with activity.  She is not aware of palpitations.  She is in atrial fibrillation today and is oblivious to the arrhythmia.  She has not experienced syncope.  She has not had falls or bleeding complications.  She has not had any recent episodes of gout, which has often affected her left wrist and her knees.    Comprehensive pacemaker check performed today shows normal device function.  Estimated device longevity is about 8-1/2 years.  Lead parameters are stable and within normal range.  There is been a slight increase in the overall burden of atrial fibrillation from 7% last year to 15% this year.  She has 81% atrial pacing and only 6% ventricular pacing.  There have been no episodes  of high ventricular rate.  Her echocardiogram performed last October 2019 was interpreted as normal diastolic function.  On my review however the diastolic annulus velocities are actually decreased and the E/e' was 13-14, signifying probable volume overload.  She has preserved left ventricular systolic function.  She has never had embolic events that we are aware of. She has normal left ventricular systolic function by previous echo, normal nuclear stress testing, had minimal CAD on angiography 2011. She does have a history of seizures associated with resection of multiple meningiomas in the past.  No recurrence of seizures while on treatment with Topamax   Past Medical History:  Diagnosis Date  . ACL (anterior cruciate ligament) rupture    Left  . Acute MI ([place])   . Arthritis   . Back pain    Secondary to arthritis  . Benign paroxysmal positional vertigo 03/18/2016  . Brain tumor ([place])    On right side. Removed in 1989  . Bruit    Duplex doppler 03/28/05 Mildly abnormal. *Right subclavian artery: Less than 50% diameter reduction.  . Chest pain    2D Echo 04/21/08 EF = >55%. Moderate tricuspid regurgitation. Persantine Myovoew stress test 04/21/08 EF = 81%, no evidence of ischemia noted.  . Chronic anticoagulation    PAF  . Chronic edema    Mild LE edema  . Chronic kidney disease    Mild, stage 2  . Coronary artery disease   . Coronary atherosclerosis    Minimal  .  Dyslipidemia   . GERD (gastroesophageal reflux disease)   . Hyperlipidemia    On pravastatin  . Memory difficulty 07/13/2014  . Meningioma ([place])    Resection in 1998  . Mild depression ([place])   . Ovarian cyst    Benign. Had a salpiingoophorectomy in 2005.  [name] PAF (paroxysmal atrial fibrillation) (HCC)    On coumadin. St. Jude PPM (serial [id number]) inserted 2010  . Paroxysmal atrial flutter ([place])   . Seizure ([place]) 2006  . Severe obesity ([place])   . Sick sinus syndrome ([place])   . Sinus node dysfunction (HCC)    S/P  implantation of a dual-chamber permanent pacemaker in 2010  . SOB (shortness of breath) on exertion    Class II. Catheterization 04/30/10 showed mild CAD with mildly elevated right heart pressures.   . Tachycardia-bradycardia syndrome ([place])    S/P implantation of dual-chamber permanent pacemaker in 2010  . Trochlear nerve palsy    Right  . Vertigo     Past Surgical History:  Procedure Laterality Date  . Brain tumor removal  1989  . CARDIAC CATHETERIZATION  04/30/10   Showed mild CAD with mildly elevated right heart pressures. (Dr. Elisabeth Cara)  . CRANIOTOMY  1989   (Brain tumor) With gamma knife procedure as well.  [name] KNEE SURGERY     Bilateral, arthroscopic  . PACEMAKER INSERTION  04/27/09   Implanted by Dr. Elisabeth Cara. [place] (serial [id number])  . PPM GENERATOR CHANGEOUT N/A 09/12/2019   Procedure: PPM GENERATOR CHANGEOUT;  Surgeon: Sanda Klein, MD;  Location: [facility];  Service: Cardiovascular;  Laterality: N/A;    Current Medications: Outpatient Medications Prior to Visit  Medication Sig Dispense Refill  . acetaminophen (TYLENOL) 500 MG tablet Take 500 mg by mouth daily as needed for moderate pain or headache.    . allopurinol (ZYLOPRIM) 100 MG tablet Take 100 mg by mouth daily.    . Carboxymethylcellul-Glycerin (LUBRICATING EYE DROPS OP) Place 1 drop into both eyes daily as needed (dry eyes).    . colchicine 0.6 MG tablet Take 1 tablet (0.6 mg total) by mouth daily as needed (gout flare). 20 tablet 0  . ferrous sulfate 325 (65 FE) MG tablet TAKE 1 TABLET (325 MG TOTAL) BY MOUTH EVERY MONDAY, WEDNESDAY, AND FRIDAY. 36 tablet 7  . KLOR-CON M10 10 MEQ tablet TAKE 1 TABLET (10 MEQ TOTAL) BY MOUTH 2 (TWO) TIMES DAILY. TAKE AN EXTRA 20 MEQ WITH FUROSEMIDE. (Patient taking differently: Take 20 mEq by mouth 2 (two) times daily. ) 240 tablet 3  . metoprolol succinate (TOPROL-XL) 25 MG 24 hr tablet Take 1 tablet (25 mg total) by mouth daily. 90 tablet 0  . nitroGLYCERIN  (NITROLINGUAL) 0.4 MG/SPRAY spray Place 3 sprays under the tongue every 5 (five) minutes x 3 doses as needed for chest pain. 12 g 3  . omeprazole (PRILOSEC) 20 MG capsule Take 20 mg by mouth daily.    [name] OVER THE COUNTER MEDICATION Place 6 drops under the tongue at bedtime as needed (anxiety/sleep). CBD oil    . Polyethyl Glycol-Propyl Glycol (SYSTANE OP) Place 1 drop into both eyes at bedtime as needed (dry eyes).    . pravastatin (PRAVACHOL) 20 MG tablet Take 20 mg by mouth at bedtime.    . topiramate (TOPAMAX) 50 MG tablet Take 50 mg by mouth 2 (two) times daily.    [name] torsemide (DEMADEX) 20 MG tablet Take 2 tablets (40 mg total) by mouth daily. 60 tablet 11  .  traMADol (ULTRAM) 50 MG tablet Take 50 mg by mouth at bedtime as needed for moderate pain (sleep).     . verapamil (VERELAN PM) 240 MG 24 hr capsule TAKE 1 CAPSULE (240 MG TOTAL) BY MOUTH AT BEDTIME. 90 capsule 2  . warfarin (COUMADIN) 4 MG tablet Take 4 mg by mouth See admin instructions. Take 4 mg at night on Mon, Tue, Wed, Fri, and Sat    . warfarin (COUMADIN) 5 MG tablet Take 5 mg by mouth See admin instructions. Take 5 mg at night on Sun and Thur     No facility-administered medications prior to visit.     Allergies:   Dilantin [phenytoin sodium extended] and Phenobarbital   Social History   Socioeconomic History  . Marital status: Divorced    Spouse name: Not on file  . Number of children: 3  . Years of education: Not on file  . Highest education level: Not on file  Occupational History    Comment: Retired  Tobacco Use  . Smoking status: Former Smoker    Years: 20.00    Types: Cigarettes    Quit date: 12/23/2007    Years since quitting: 12.3  . Smokeless tobacco: Never Used  Substance and Sexual Activity  . Alcohol use: No    Alcohol/week: 0.0 standard drinks  . Drug use: No  . Sexual activity: Not on file  Other Topics Concern  . Not on file  Social History Narrative  . Not on file   Social Determinants of  Health   Financial Resource Strain:   . Difficulty of Paying Living Expenses:   Food Insecurity:   . Worried About Charity fundraiser in the Last Year:   . Arboriculturist in the Last Year:   Transportation Needs:   . Film/video editor (Medical):   [name] Lack of Transportation (Non-Medical):   Physical Activity:   . Days of Exercise per Week:   . Minutes of Exercise per Session:   Stress:   . Feeling of Stress :   Social Connections:   . Frequency of Communication with Friends and Family:   . Frequency of Social Gatherings with Friends and Family:   . Attends Religious Services:   . Active Member of Clubs or Organizations:   . Attends Archivist Meetings:   [name] Marital Status:      Family History:  The patient's family history includes Heart disease in her brother, brother, brother, father, sister, sister, and sister; Stroke in her mother.   ROS:   Please see the history of present illness.    ROS All other systems are reviewed and are negative.   PHYSICAL EXAM:   VS:  BP 127/81   Pulse 83   Ht 5\' 4"  (1.626 m)   Wt 238 lb (108 kg)   SpO2 97%   BMI 40.85 kg/m      General: Alert, oriented x3, no distress, morbidly obese, well-healed left subclavian pacemaker site Head: no evidence of trauma, PERRL, EOMI, no exophtalmos or lid lag, no myxedema, no xanthelasma; normal ears, nose and oropharynx Neck: normal jugular venous pulsations and no hepatojugular reflux; brisk carotid pulses without delay and no carotid bruits Chest: clear to auscultation, no signs of consolidation by percussion or palpation, normal fremitus, symmetrical and full respiratory excursions Cardiovascular: normal position and quality of the apical impulse, irregular rhythm, normal first and second heart sounds, no murmurs, rubs or gallops Abdomen: no tenderness or distention, no masses  by palpation, no abnormal pulsatility or arterial bruits, normal bowel sounds, no hepatosplenomegaly  Extremities: no clubbing, cyanosis or edema; 2+ radial, ulnar and brachial pulses bilaterally; 2+ right femoral, posterior tibial and dorsalis pedis pulses; 2+ left femoral, posterior tibial and dorsalis pedis pulses; no subclavian or femoral bruits Neurological: grossly nonfocal Psych: Normal mood and affect    Wt Readings from Last 3 Encounters:  04/30/20 238 lb (108 kg)  03/30/20 236 lb (107 kg)  02/23/20 239 lb (108.4 kg)      Studies/Labs Reviewed:   ECHO 11/25/2018  - Left ventricle: The cavity size was mildly reduced. There was   mild focal basal hypertrophy of the septum. Systolic function was   normal. The estimated ejection fraction was in the range of 50%   to 55%. Wall motion was normal; there were no regional wall   motion abnormalities. The study is indeterminate for the   evaluation of LV diastolic function. - Aortic valve: Trileaflet; mildly thickened, mildly calcified   leaflets. Sclerosis without stenosis. - Mitral valve: There was mild regurgitation. - Tricuspid valve: There was trivial regurgitation. - Pulmonary arteries: Systolic pressure was within the normal   range.  Impressions:  - EF 53% by 3D imaging, which agrees with visual and 2D estimates.   GLS mildly abnormal at -16.2%. No significant wall motion   abnormalities.   EKG:  EKG is ordered today.  It shows atrial fibrillation with controlled ventricular response.  QTc interval 445 ms.  Recent Labs: 03/30/2020: ALT 21; BUN 35; Creatinine, Ser 1.31; Hemoglobin 14.7; Platelets 178; Potassium 5.0; Sodium 141  Lipid Panel  No results found for: CHOL, TRIG, HDL, CHOLHDL, VLDL, LDLCALC, LDLDIRECT, LABVLDL  She was due to have a lipid profile checked at her physical with Dr. Karlton Lemon in early January.  I do not have a copy of those labs yet.  ASSESSMENT:    1. Chronic diastolic heart failure (HCC)   2. Paroxysmal atrial fibrillation ([place])   3. Long term current use of anticoagulant therapy   4.  SSS (sick sinus syndrome) ([place])   5. Pacemaker   6. Essential hypertension   7. Coronary artery disease involving native coronary artery of native heart without angina pectoris   8. Morbid obesity ([place])   9. Gouty arthropathy   10. Stage 3b chronic kidney disease   11. Pure hypercholesterolemia      PLAN:  In order of problems listed above:  1.Chronic diastolic heart failure: Clinically appears to be euvolemic and she probably has gained some real weight.  Reviewed the importance of a sodium restricted diet and reinforced the need for daily weights.  Continue torsemide 40 mg once daily.  2. Paroxysmal atrial fibrillation: There has been an increase in the burden of atrial fibrillation but she is asymptomatic.  She is well rate controlled..  On anticoagulation.  CHADSVasc 5 (age 25, HTN, gender, new Dx of CHF). 3. Anticoagulation with warfarin, monitored by PCP.  Well-tolerated without bleeding complications.  She does not like the fact that she has to have phlebotomies when she checks her PT/INR and is wondering whether she wants to switch to our Coumadin clinic for convenience. 4. Sinus node dysfunction: When she is not in atrial fibrillation she has virtually 100% atrial paced rhythm.  The heart rate histogram distribution is appropriate for her very sedentary lifestyle. 5. PPM: Remote downloads every 3 months.  Normal device function. 6.Essential hypertension: Well-controlled 7. CAD: She does not have angina pectoris.  She had  minor coronary atherosclerosis by previous cardiac catheterization 8. Obesity: Her daughter is very upset with [name] inability to stay away from sweets and carbs and expressed her frustration today. 9. Gout: Avoid taking daily due to the interaction with verapamil, but can take for 3-5 days during gout attacks. 10. CKD 3: Most recent creatinine 1.31, essentially unchanged. 11. HLP: On statin, labs with PCP.     Medication Adjustments/Labs and Tests  Ordered: Current medicines are reviewed at length with the patient today.  Concerns regarding medicines are outlined above.  Medication changes, Labs and Tests ordered today are listed in the Patient Instructions below. Patient Instructions  Medication Instructions:  No changes *If you need a refill on your cardiac medications before your next appointment, please call your pharmacy*   Lab Work: None ordered If you have labs (blood work) drawn today and your tests are completely normal, you will receive your results only by: [name] MyChart Message (if you have MyChart) OR . A paper copy in the mail If you have any lab test that is abnormal or we need to change your treatment, we will call you to review the results.   Testing/Procedures: None ordered   Follow-Up: At [facility], you and your health needs are our priority.  As part of our continuing mission to provide you with exceptional heart care, we have created designated Provider Care Teams.  These Care Teams include your primary Cardiologist (physician) and Advanced Practice Providers (APPs -  Physician Assistants and Nurse Practitioners) who all work together to provide you with the care you need, when you need it.  We recommend signing up for the patient portal called "MyChart".  Sign up information is provided on this After Visit Summary.  MyChart is used to connect with patients for Virtual Visits (Telemedicine).  Patients are able to view lab/test results, encounter notes, upcoming appointments, etc.  Non-urgent messages can be sent to your provider as well.   To learn more about what you can do with MyChart, go to [email or web address].    Your next appointment:   6 month(s)  The format for your next appointment:   In Person  Provider:   You may see Sanda Klein, MD or one of the following Advanced Practice Providers on your designated Care Team:    Almyra Deforest, PA-C  Fabian Sharp, [place] or   Roby Lofts, PA-C        Signed, Sanda Klein, MD  05/01/2020 5:47 PM    [address], [place], [place]  [address] Phone: [phone]; Fax: [phone]: There is

## 2020-04-30 NOTE — Patient Instructions (Signed)

## 2020-04-30 NOTE — Patient Instructions (Signed)

## 2020-05-01 ENCOUNTER — Encounter: Payer: Self-pay | Admitting: Cardiovascular Disease

## 2020-05-06 NOTE — Progress Notes (Signed)
Subjective: [name] is a 86 y.o. female patient seen today at [facility]. Patient has h/o CKD stage 3 and painful mycotic nails b/l that are difficult to trim. Pain interferes with ambulation. Aggravating factors include wearing enclosed shoe gear. Pain is relieved with periodic professional debridement.  Her daughter is accompanying her on today's visit. They voice no new pedal concerns on today's visit.  Patient Active Problem List   Diagnosis Date Noted  . Pacemaker battery depletion 09/12/2019  . Acute on chronic diastolic heart failure ([place]) 11/17/2018  . History of anticoagulant therapy 07/12/2018  . Abnormal C-reactive protein 07/12/2018  . Gastroesophageal reflux disease without esophagitis 07/12/2018  . History of cerebral meningioma 07/12/2018  . Hyperlipidemia 07/12/2018  . SSS (sick sinus syndrome) ([place]) 06/04/2016  . Meningioma, multiple ([place]) 03/18/2016  . Benign paroxysmal positional vertigo 03/18/2016  . Shoulder pain 10/10/2015  . Blood glucose abnormal 09/19/2015  . Gouty arthropathy 09/19/2015  . Vitamin D deficiency 09/19/2015  . Exertional shortness of breath 04/10/2015  . Memory difficulty 07/13/2014  . Angina, class III ([place]) 06/06/2014  . CAD (coronary artery disease) 06/06/2014  . Gout flare of right elbow 06/06/2014  . HTN (hypertension) 08/30/2013  . CKD (chronic kidney disease) stage 3, GFR 30-59 ml/min (HCC) 08/30/2013  . Seizure ([place]) 08/30/2013  . Obesity (BMI 30.0-34.9) 08/30/2013  . Pacemaker - dual chamber St. Jude RF device, implanted May 2010 08/30/2013  . Paroxysmal atrial fibrillation ([place]) 03/08/2013  . Long term current use of anticoagulant therapy 03/08/2013    Current Outpatient Medications on File Prior to Visit  Medication Sig Dispense Refill  . acetaminophen (TYLENOL) 500 MG tablet Take 500 mg by mouth daily as needed for moderate pain or headache.    . allopurinol (ZYLOPRIM) 100 MG tablet Take 100 mg by mouth daily.     . Carboxymethylcellul-Glycerin (LUBRICATING EYE DROPS OP) Place 1 drop into both eyes daily as needed (dry eyes).    . colchicine 0.6 MG tablet Take 1 tablet (0.6 mg total) by mouth daily as needed (gout flare). 20 tablet 0  . ferrous sulfate 325 (65 FE) MG tablet TAKE 1 TABLET (325 MG TOTAL) BY MOUTH EVERY MONDAY, WEDNESDAY, AND FRIDAY. 36 tablet 7  . KLOR-CON M10 10 MEQ tablet TAKE 1 TABLET (10 MEQ TOTAL) BY MOUTH 2 (TWO) TIMES DAILY. TAKE AN EXTRA 20 MEQ WITH FUROSEMIDE. (Patient taking differently: Take 20 mEq by mouth 2 (two) times daily. ) 240 tablet 3  . metoprolol succinate (TOPROL-XL) 25 MG 24 hr tablet Take 1 tablet (25 mg total) by mouth daily. 90 tablet 0  . nitroGLYCERIN (NITROLINGUAL) 0.4 MG/SPRAY spray Place 3 sprays under the tongue every 5 (five) minutes x 3 doses as needed for chest pain. 12 g 3  . omeprazole (PRILOSEC) 20 MG capsule Take 20 mg by mouth daily.    [name] OVER THE COUNTER MEDICATION Place 6 drops under the tongue at bedtime as needed (anxiety/sleep). CBD oil    . Polyethyl Glycol-Propyl Glycol (SYSTANE OP) Place 1 drop into both eyes at bedtime as needed (dry eyes).    . pravastatin (PRAVACHOL) 20 MG tablet Take 20 mg by mouth at bedtime.    . topiramate (TOPAMAX) 50 MG tablet Take 50 mg by mouth 2 (two) times daily.    [name] torsemide (DEMADEX) 20 MG tablet Take 2 tablets (40 mg total) by mouth daily. 60 tablet 11  . traMADol (ULTRAM) 50 MG tablet Take 50 mg by mouth at bedtime  as needed for moderate pain (sleep).     . verapamil (VERELAN PM) 240 MG 24 hr capsule TAKE 1 CAPSULE (240 MG TOTAL) BY MOUTH AT BEDTIME. 90 capsule 2  . warfarin (COUMADIN) 4 MG tablet Take 4 mg by mouth See admin instructions. Take 4 mg at night on Mon, Tue, Wed, Fri, and Sat    . warfarin (COUMADIN) 5 MG tablet Take 5 mg by mouth See admin instructions. Take 5 mg at night on Sun and Thur     No current facility-administered medications on file prior to visit.    Allergies  Allergen Reactions   . Dilantin [Phenytoin Sodium Extended] Hives  . Phenobarbital Hives          Objective: Physical Exam  General: [name] is a pleasant 86 y.o. y.o. African American female, in NAD. AAO x 3.   Vascular:  Neurovascular status unchanged b/l. Capillary fill time to digits <3 seconds b/l. Faintly palpable pedal pulses b/l. Pedal hair absent b/l Skin temperature gradient within normal limits b/l. Evidence of chronic venous insufficiency b/l LE. Trace edema noted b/l feet.  Dermatological:  Pedal skin with normal turgor, texture and tone bilaterally. No open wounds bilaterally. Toenails 1-5 b/l elongated, dystrophic, thickened, crumbly with subungual debris and tenderness to dorsal palpation.  Musculoskeletal:  Normal muscle strength 5/5 to all lower extremity muscle groups bilaterally. No pain crepitus or joint limitation noted with ROM b/l. Hammertoes noted to the L 2nd toe and L 3rd toe.  Neurological:  Protective sensation intact 5/5 intact bilaterally with 10g monofilament b/l. Vibratory sensation intact b/l.  Assessment and Plan:  1. Pain due to onychomycosis of toenails of both feet    -Examined patient. -No new findings. No new orders. -Toenails 1-5 b/l were debrided in length and girth with sterile nail nippers and dremel without iatrogenic bleeding.  -Patient to continue soft, supportive shoe gear daily. -Patient to report any pedal injuries to medical professional immediately. -Patient/POA to call should there be question/concern in the interim.  Return in about 3 months (around 07/31/2020).  Marzetta Board, DPM

## 2020-05-12 ENCOUNTER — Other Ambulatory Visit: Payer: Self-pay | Admitting: Cardiovascular Disease

## 2020-06-08 ENCOUNTER — Ambulatory Visit (INDEPENDENT_AMBULATORY_CARE_PROVIDER_SITE_OTHER): Payer: Medicare Other | Admitting: *Deleted

## 2020-06-08 DIAGNOSIS — I495 Sick sinus syndrome: Secondary | ICD-10-CM | POA: Diagnosis not present

## 2020-06-08 LAB — CUP PACEART REMOTE DEVICE CHECK
Battery Remaining Longevity: 107 mo
Battery Remaining Percentage: 95.5 %
Battery Voltage: 3.01 V
Brady Statistic AP VP Percent: 1 %
Brady Statistic AP VS Percent: 96 %
Brady Statistic AS VP Percent: 1 %
Brady Statistic AS VS Percent: 3.5 %
Brady Statistic RA Percent Paced: 83 %
Brady Statistic RV Percent Paced: 5.5 %
Date Time Interrogation Session: 20210618020019
Implantable Lead Implant Date: 20100507
Implantable Lead Implant Date: 20100507
Implantable Lead Location: 753859
Implantable Lead Location: 753860
Implantable Pulse Generator Implant Date: 20200921
Lead Channel Impedance Value: 390 Ohm
Lead Channel Impedance Value: 490 Ohm
Lead Channel Pacing Threshold Amplitude: 0.625 V
Lead Channel Pacing Threshold Amplitude: 1 V
Lead Channel Pacing Threshold Pulse Width: 0.5 ms
Lead Channel Pacing Threshold Pulse Width: 0.5 ms
Lead Channel Sensing Intrinsic Amplitude: 11.9 mV
Lead Channel Sensing Intrinsic Amplitude: 4.3 mV
Lead Channel Setting Pacing Amplitude: 1.625
Lead Channel Setting Pacing Amplitude: 2.5 V
Lead Channel Setting Pacing Pulse Width: 0.5 ms
Lead Channel Setting Sensing Sensitivity: 4 mV
Pulse Gen Model: 2272
Pulse Gen Serial Number: 9166074

## 2020-06-08 NOTE — Progress Notes (Signed)
Remote pacemaker transmission.   

## 2020-06-20 ENCOUNTER — Other Ambulatory Visit: Payer: Self-pay | Admitting: Cardiovascular Disease

## 2020-07-23 IMAGING — CR DG CHEST 2V
2 series · 2 of 2 positions shown · non-contrast
Comparison: 06/06/2014

CLINICAL DATA: Fifth shortness of breath

EXAM:
CHEST - 2 VIEW

[w chest lat]
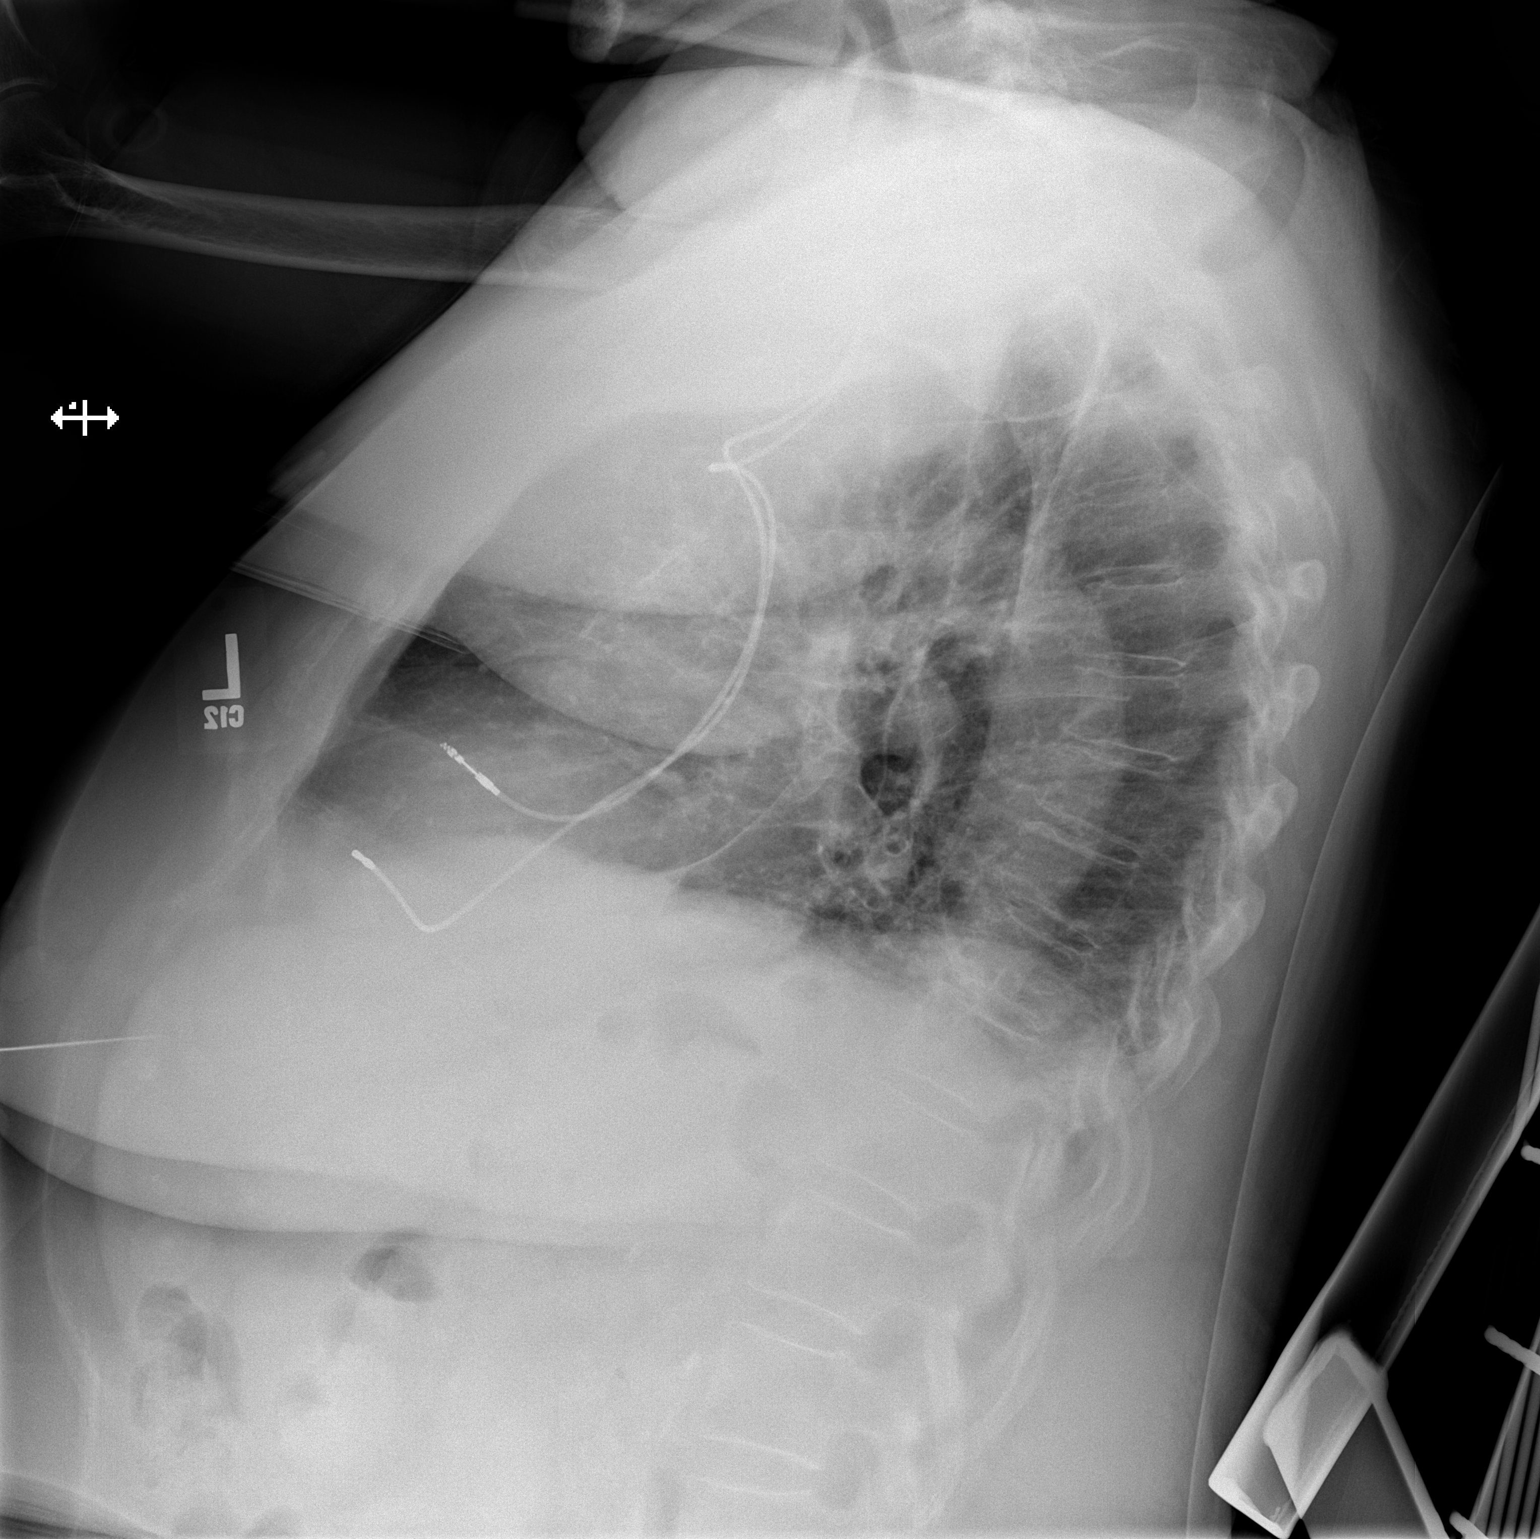

[x chest ap]
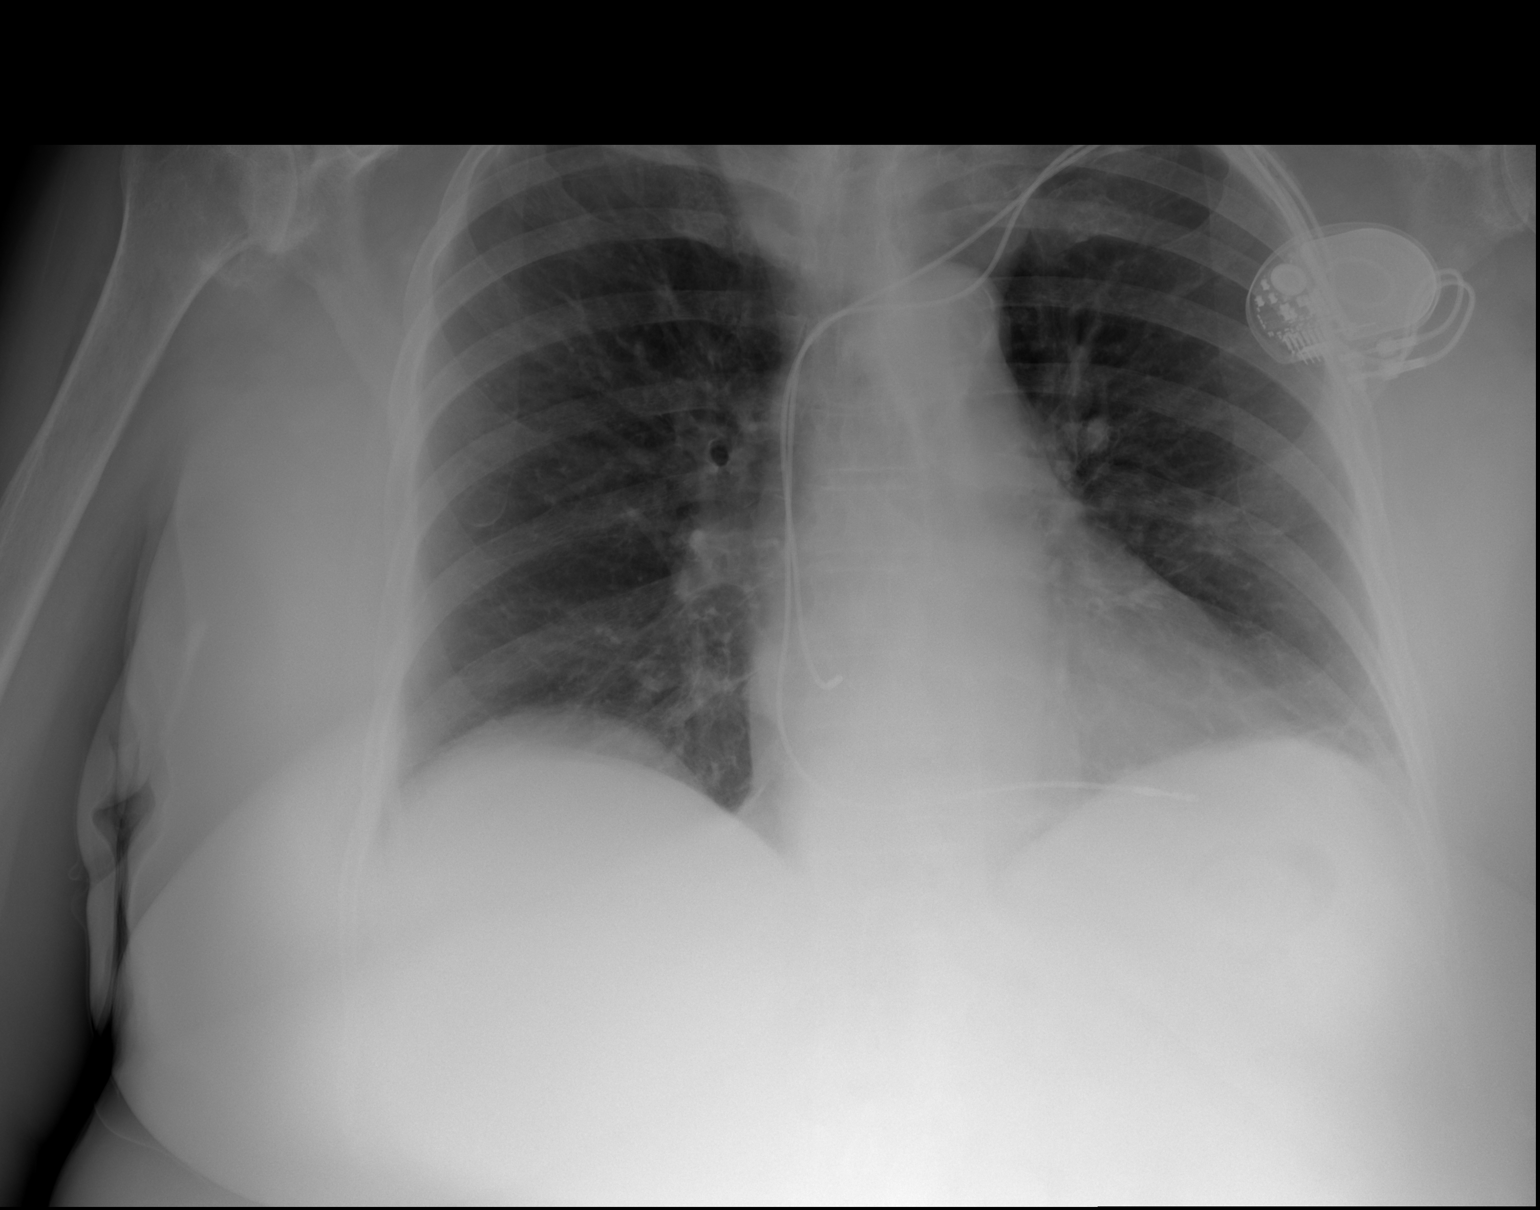

[2 of 2 positions shown; findings below may reference images not displayed]

FINDINGS: Left pacer remains in place, unchanged. Heart and mediastinal
contours are within normal limits. No focal opacities or effusions.
No acute bony abnormality.
IMPRESSION: No active cardiopulmonary disease.

## 2020-07-30 ENCOUNTER — Encounter: Payer: Self-pay | Admitting: Podiatry

## 2020-07-30 ENCOUNTER — Other Ambulatory Visit: Payer: Self-pay

## 2020-07-30 ENCOUNTER — Ambulatory Visit: Payer: Medicare Other | Admitting: Podiatry

## 2020-07-30 DIAGNOSIS — R600 Localized edema: Secondary | ICD-10-CM

## 2020-07-30 DIAGNOSIS — B351 Tinea unguium: Secondary | ICD-10-CM | POA: Diagnosis not present

## 2020-07-30 DIAGNOSIS — M79674 Pain in right toe(s): Secondary | ICD-10-CM | POA: Diagnosis not present

## 2020-07-30 DIAGNOSIS — M79675 Pain in left toe(s): Secondary | ICD-10-CM | POA: Diagnosis not present

## 2020-07-30 DIAGNOSIS — Z9229 Personal history of other drug therapy: Secondary | ICD-10-CM

## 2020-07-30 NOTE — Patient Instructions (Signed)
How to Use Compression Stockings Compression stockings are elastic socks that squeeze the legs. They help increase blood flow (circulation) to the legs, decrease swelling in the legs, and reduce the chance of developing blood clots in the lower legs. Compression stockings are often used by people who:  Are recovering from surgery.  Have poor circulation in their legs.  Tend to get blood clots in their legs.  Have bulging (varicose) veins.  Sit or stay in bed for long periods of time. Follow instructions from your health care provider about how and when to wear your compression stockings. How to wear compression stockings Before you put on your compression stockings:  Make sure that they are the correct size and degree of compression. If you do not know your size or required grade of compression, ask your health care provider and follow the manufacturer's instructions that come with the stockings.  Make sure that they are clean, dry, and in good condition.  Check them for rips and tears. Do not put them on if they are ripped or torn. Put your stockings on first thing in the morning, before you get out of bed. Keep them on for as long as your health care provider advises. When you are wearing your stockings:  Keep them as smooth as possible. Do not allow them to bunch up. It is especially important to prevent the stockings from bunching up around your toes or behind your knees.  Do not roll the stockings downward and leave them rolled down. This can decrease blood flow to your leg.  Change them right away if they become wet or dirty. When you take off your stockings, inspect your legs and feet. Check for:  Open sores.  Red spots.  Swelling. General tips  Do not stop wearing compression stockings without talking to your health care provider first.  Wash your stockings every day with mild detergent in cold or warm water. Do not use bleach. Air-dry your stockings or dry them in a  clothes dryer on low heat. It may be helpful to have two pairs so that you have a pair to wear while the other is being washed.  Replace your stockings every 3-6 months.  If skin moisturizing is part of your treatment plan, apply lotion or cream at night so that your skin will be dry when you put on the stockings in the morning. It is harder to put the stockings on when you have lotion on your legs or feet.  Wear nonskid shoes or slip-resistant socks when walking while wearing compression stockings. Contact a health care provider and remove your stockings if you have:  A feeling of pins and needles in your feet or legs.  Open sores, red spots, or other skin changes on your feet or legs.  Swelling or pain that gets worse. Get help right away if you have:  Numbness or tingling in your lower legs that does not get better right after you take the stockings off.  Toes or feet that are unusually cold or turn a bluish color.  A warm or red area on your leg.  New swelling or soreness in your leg.  Shortness of breath.  Chest pain.  A fast or irregular heartbeat.  Light-headedness.  Dizziness. Summary  Compression stockings are elastic socks that squeeze the legs.  They help increase blood flow (circulation) to the legs, decrease swelling in the legs, and reduce the chance of developing blood clots in the lower legs.  Follow instructions  from your health care provider about how and when to wear your compression stockings.  Do not stop wearing your compression stockings without talking to your health care provider first. This information is not intended to replace advice given to you by your health care provider. Make sure you discuss any questions you have with your health care provider. Document Revised: 12/10/2017 Document Reviewed: 12/10/2017 Elsevier Patient Education  [place].   Edema  Edema is an abnormal buildup of fluids in the body tissues and under the  skin. Swelling of the legs, feet, and ankles is a common symptom that becomes more likely as you get older. Swelling is also common in looser tissues, like around the eyes. When the affected area is squeezed, the fluid may move out of that spot and leave a dent for a few moments. This dent is called pitting edema. There are many possible causes of edema. Eating too much salt (sodium) and being on your feet or sitting for a long time can cause edema in your legs, feet, and ankles. Hot weather may make edema worse. Common causes of edema include:  Heart failure.  Liver or kidney disease.  Weak leg blood vessels.  Cancer.  An injury.  Pregnancy.  Medicines.  Being obese.  Low protein levels in the blood. Edema is usually painless. Your skin may look swollen or shiny. Follow these instructions at home:  Keep the affected body part raised (elevated) above the level of your heart when you are sitting or lying down.  Do not sit still or stand for long periods of time.  Do not wear tight clothing. Do not wear garters on your upper legs.  Exercise your legs to get your circulation going. This helps to move the fluid back into your blood vessels, and it may help the swelling go down.  Wear elastic bandages or support stockings to reduce swelling as told by your health care provider.  Eat a low-salt (low-sodium) diet to reduce fluid as told by your health care provider.  Depending on the cause of your swelling, you may need to limit how much fluid you drink (fluid restriction).  Take over-the-counter and prescription medicines only as told by your health care provider. Contact a health care provider if:  Your edema does not get better with treatment.  You have heart, liver, or kidney disease and have symptoms of edema.  You have sudden and unexplained weight gain. Get help right away if:  You develop shortness of breath or chest pain.  You cannot breathe when you lie  down.  You develop pain, redness, or warmth in the swollen areas.  You have heart, liver, or kidney disease and suddenly get edema.  You have a fever and your symptoms suddenly get worse. Summary  Edema is an abnormal buildup of fluids in the body tissues and under the skin.  Eating too much salt (sodium) and being on your feet or sitting for a long time can cause edema in your legs, feet, and ankles.  Keep the affected body part raised (elevated) above the level of your heart when you are sitting or lying down. This information is not intended to replace advice given to you by your health care provider. Make sure you discuss any questions you have with your health care provider. Document Revised: 04/27/2019 Document Reviewed: 01/10/2017 Elsevier Patient Education  [place].

## 2020-07-31 NOTE — Progress Notes (Signed)
Subjective: [name] is a 86 y.o. female patient seen today at [facility]. Patient has h/o CKD stage 3 and painful mycotic nails b/l that are difficult to trim. Pain interferes with ambulation. Aggravating factors include wearing enclosed shoe gear. Pain is relieved with periodic professional debridement.  Her daughter is accompanying her on today's visit. [name] relates she has some soreness to left leg at times. She relates not wearing her compression hose stating she has gotten rid of them.  Patient Active Problem List   Diagnosis Date Noted  . Pacemaker battery depletion 09/12/2019  . Acute on chronic diastolic heart failure ([place]) 11/17/2018  . History of anticoagulant therapy 07/12/2018  . Abnormal C-reactive protein 07/12/2018  . Gastroesophageal reflux disease without esophagitis 07/12/2018  . History of cerebral meningioma 07/12/2018  . Hyperlipidemia 07/12/2018  . SSS (sick sinus syndrome) ([place]) 06/04/2016  . Meningioma, multiple ([place]) 03/18/2016  . Benign paroxysmal positional vertigo 03/18/2016  . Shoulder pain 10/10/2015  . Blood glucose abnormal 09/19/2015  . Gouty arthropathy 09/19/2015  . Vitamin D deficiency 09/19/2015  . Exertional shortness of breath 04/10/2015  . Memory difficulty 07/13/2014  . Angina, class III ([place]) 06/06/2014  . CAD (coronary artery disease) 06/06/2014  . Gout flare of right elbow 06/06/2014  . HTN (hypertension) 08/30/2013  . CKD (chronic kidney disease) stage 3, GFR 30-59 ml/min ([facility]) 08/30/2013  . Seizure ([place]) 08/30/2013  . Obesity (BMI 30.0-34.9) 08/30/2013  . Pacemaker - dual chamber [employer] RF device, implanted May 2010 08/30/2013  . Paroxysmal atrial fibrillation ([place]) 03/08/2013  . Long term current use of anticoagulant therapy 03/08/2013    Current Outpatient Medications on File Prior to Visit  Medication Sig Dispense Refill  . acetaminophen (TYLENOL) 500 MG tablet Take 500 mg by mouth daily as needed for moderate  pain or headache.    . allopurinol (ZYLOPRIM) 100 MG tablet Take 100 mg by mouth daily.    . Carboxymethylcellul-Glycerin (LUBRICATING EYE DROPS OP) Place 1 drop into both eyes daily as needed (dry eyes).    . colchicine 0.6 MG tablet Take 1 tablet (0.6 mg total) by mouth daily as needed (gout flare). 20 tablet 0  . ferrous sulfate 325 (65 FE) MG tablet TAKE 1 TABLET (325 MG TOTAL) BY MOUTH EVERY MONDAY, WEDNESDAY, AND FRIDAY. 36 tablet 7  . KLOR-CON M10 10 MEQ tablet TAKE 1 TABLET (10 MEQ TOTAL) BY MOUTH 2 (TWO) TIMES DAILY. TAKE AN EXTRA 20 MEQ WITH FUROSEMIDE. (Patient taking differently: Take 20 mEq by mouth 2 (two) times daily. ) 240 tablet 3  . metoprolol succinate (TOPROL-XL) 25 MG 24 hr tablet TAKE 1 TABLET BY MOUTH EVERY DAY 90 tablet 1  . nitroGLYCERIN (NITROLINGUAL) 0.4 MG/SPRAY spray Place 3 sprays under the tongue every 5 (five) minutes x 3 doses as needed for chest pain. 12 g 3  . omeprazole (PRILOSEC) 20 MG capsule Take 20 mg by mouth daily.    [name] OVER THE COUNTER MEDICATION Place 6 drops under the tongue at bedtime as needed (anxiety/sleep). CBD oil    . Polyethyl Glycol-Propyl Glycol (SYSTANE OP) Place 1 drop into both eyes at bedtime as needed (dry eyes).    . pravastatin (PRAVACHOL) 20 MG tablet Take 20 mg by mouth at bedtime.    . topiramate (TOPAMAX) 50 MG tablet Take 50 mg by mouth 2 (two) times daily.    [name] torsemide (DEMADEX) 20 MG tablet Take 2 tablets (40 mg total) by mouth daily. 60 tablet  11  . traMADol (ULTRAM) 50 MG tablet Take 50 mg by mouth at bedtime as needed for moderate pain (sleep).     . verapamil (VERELAN PM) 240 MG 24 hr capsule TAKE 1 CAPSULE (240 MG TOTAL) BY MOUTH AT BEDTIME. 90 capsule 2  . warfarin (COUMADIN) 4 MG tablet Take 4 mg by mouth See admin instructions. Take 4 mg at night on Mon, Tue, Wed, Fri, and Sat    . warfarin (COUMADIN) 5 MG tablet Take 5 mg by mouth See admin instructions. Take 5 mg at night on Sun and Thur     No current  facility-administered medications on file prior to visit.    Allergies  Allergen Reactions  . Dilantin [Phenytoin Sodium Extended] Hives  . Phenobarbital Hives          Objective: Physical Exam  General: [name] is a pleasant 86 y.o. y.o. African American female, in NAD. AAO x 3.   Vascular:  Neurovascular status unchanged b/l lower extremities. Capillary fill time to digits <3 seconds b/l lower extremities. Faintly palpable pedal pulses b/l. Pedal hair absent. Lower extremity skin temperature gradient within normal limits. No pain with calf compression b/l. Nonpitting edema noted b/l lower extremities. Evidence of chronic venous insufficiency b/l LE. No warmth of calf b/l and edema of both LE is symmetrical.  Dermatological:  Pedal skin with normal turgor, texture and tone bilaterally. No open wounds bilaterally. Toenails 1-5 b/l elongated, dystrophic, thickened, crumbly with subungual debris and tenderness to dorsal palpation.  Musculoskeletal:  Normal muscle strength 5/5 to all lower extremity muscle groups bilaterally. No pain crepitus or joint limitation noted with ROM b/l. Hammertoes noted to the L 2nd toe and L 3rd toe.  Neurological:  Protective sensation intact 5/5 intact bilaterally with 10g monofilament b/l. Vibratory sensation intact b/l.  Assessment and Plan:  1. Pain due to onychomycosis of toenails of both feet   2. Hx of long term use of blood thinners   3. Localized edema    -Examined patient. -No new findings. No new orders. -Toenails 1-5 b/l were debrided in length and girth with sterile nail nippers and dremel without iatrogenic bleeding.  -Patient to report any pedal injuries to medical professional immediately. I don't suspect DVT in either extremity today, but advised daughter to contact PCP should she become concerned in the future. Educated [name] on importance of wearing compression hose to manage edema and prevent blood clots. Informed  her the best time to apply them is early in the morning upon waking while her legs are less swollen. Remove compression hose every evening around supper time. Written information given on edema and how to apply compression hose. -Patient to continue soft, supportive shoe gear daily. -Patient/POA to call should there be question/concern in the interim.  Return in about 3 months (around 10/30/2020) for nail trim.  Marzetta Board, DPM

## 2020-08-21 ENCOUNTER — Telehealth: Payer: Self-pay | Admitting: Neurology

## 2020-08-21 DIAGNOSIS — R519 Headache, unspecified: Secondary | ICD-10-CM

## 2020-08-21 NOTE — Telephone Encounter (Signed)
I called pt and she is having issues with headaches again and is wanting to proceed with gettting CT head noted in last 02/2020 note.  She continues with vision issues and has seen her eye physician.  I offered appt but she wanted me to ask you first.

## 2020-08-21 NOTE — Telephone Encounter (Addendum)
I called pt and relayed that order for CT head was placed.  GI should call to get you set up.  She verbalized understanding.

## 2020-08-21 NOTE — Telephone Encounter (Signed)
[employer] medicare order sent to GI. No auth they will reach out to the patient to schedule.  

## 2020-08-21 NOTE — Telephone Encounter (Signed)
Pt called stating she is wanting to speak to the RN regarding possibly getting a scan ordered due to her having vision issues. Pt would like to know if this has anything to do with her seizures. Please advise.

## 2020-08-21 NOTE — Telephone Encounter (Signed)
I can order CT head due to headaches, and status post meningioma resection.

## 2020-08-28 ENCOUNTER — Other Ambulatory Visit: Payer: Self-pay

## 2020-08-28 ENCOUNTER — Ambulatory Visit
Admission: RE | Admit: 2020-08-28 | Discharge: 2020-08-28 | Disposition: A | Discharge status: Home / Self Care | Payer: Medicare Other | Source: Ambulatory Visit | Attending: Neurology | Admitting: Neurology

## 2020-08-28 DIAGNOSIS — R519 Headache, unspecified: Secondary | ICD-10-CM

## 2020-08-30 ENCOUNTER — Telehealth: Payer: Self-pay

## 2020-08-30 NOTE — Telephone Encounter (Signed)
-----   Message from Suzzanne Cloud, NP sent at 08/30/2020  5:56 AM EDT ----- Please call the patient. CT showed no acute findings, is stable.   CT head (without) demonstrating:  - Stable right anterior temporal encephalomalacia and overlying post-craniotomy changes. - No acute findings.

## 2020-08-30 NOTE — Telephone Encounter (Signed)
Pt was notified of the message below. Pt has no additional questions.

## 2020-09-02 ENCOUNTER — Other Ambulatory Visit: Payer: Self-pay | Admitting: Cardiovascular Disease

## 2020-09-07 ENCOUNTER — Ambulatory Visit (INDEPENDENT_AMBULATORY_CARE_PROVIDER_SITE_OTHER): Payer: Medicare Other | Admitting: *Deleted

## 2020-09-07 DIAGNOSIS — I495 Sick sinus syndrome: Secondary | ICD-10-CM | POA: Diagnosis not present

## 2020-09-07 LAB — CUP PACEART REMOTE DEVICE CHECK
Battery Remaining Longevity: 102 mo
Battery Remaining Percentage: 95.5 %
Battery Voltage: 3.01 V
Brady Statistic AP VP Percent: 1 %
Brady Statistic AP VS Percent: 95 %
Brady Statistic AS VP Percent: 1 %
Brady Statistic AS VS Percent: 3.4 %
Brady Statistic RA Percent Paced: 85 %
Brady Statistic RV Percent Paced: 5.9 %
Date Time Interrogation Session: 20210917020014
Implantable Lead Implant Date: 20100507
Implantable Lead Implant Date: 20100507
Implantable Lead Location: 753859
Implantable Lead Location: 753860
Implantable Pulse Generator Implant Date: 20200921
Lead Channel Impedance Value: 360 Ohm
Lead Channel Impedance Value: 480 Ohm
Lead Channel Pacing Threshold Amplitude: 0.875 V
Lead Channel Pacing Threshold Amplitude: 1 V
Lead Channel Pacing Threshold Pulse Width: 0.5 ms
Lead Channel Pacing Threshold Pulse Width: 0.5 ms
Lead Channel Sensing Intrinsic Amplitude: 12 mV
Lead Channel Sensing Intrinsic Amplitude: 3.9 mV
Lead Channel Setting Pacing Amplitude: 1.875
Lead Channel Setting Pacing Amplitude: 2.5 V
Lead Channel Setting Pacing Pulse Width: 0.5 ms
Lead Channel Setting Sensing Sensitivity: 4 mV
Pulse Gen Model: 2272
Pulse Gen Serial Number: 9166074

## 2020-09-10 NOTE — Progress Notes (Signed)
Remote pacemaker transmission.   

## 2020-10-25 ENCOUNTER — Encounter: Payer: Self-pay | Admitting: Neurology

## 2020-10-25 ENCOUNTER — Ambulatory Visit: Payer: Medicare Other | Admitting: Neurology

## 2020-10-25 VITALS — BP 128/72 | Ht 64.0 in | Wt 234.6 lb

## 2020-10-25 DIAGNOSIS — D429 Neoplasm of uncertain behavior of meninges, unspecified: Secondary | ICD-10-CM | POA: Diagnosis not present

## 2020-10-25 DIAGNOSIS — R569 Unspecified convulsions: Secondary | ICD-10-CM

## 2020-10-25 DIAGNOSIS — R519 Headache, unspecified: Secondary | ICD-10-CM | POA: Diagnosis not present

## 2020-10-25 NOTE — Progress Notes (Signed)
PATIENT: [name] DOB: Jun 30, 1932  REASON FOR VISIT: follow up HISTORY FROM: patient  HISTORY OF PRESENT ILLNESS: Today 10/25/20 [name] is a 86 year old female with history of meningioma status post resection, history of seizures secondary.  She is on Topamax 50 mg twice a day for seizures. Feels Topamax does not help her memory, but has not wanted to change. In March, started complaining of headaches, continues to report right sided headaches, around temple area, maybe a few times a week, are mild, takes Tylenol with good benefit. Located at area of postsurgical site.  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 Kathlen Mody, complains of more double vision, he has suggested she wear a patch or try prisms, but she is not willing, did a procedure earlier this year to help, no benefit. Is on Coumadin, has pacemaker, the new pacemaker may be MRI compatible, but leads are not. She had CT head in September 2021, due to report of headache, overall stable, no acute findings. She lives with her daughter. No falls, using cane. Reported episode of loss of time, daughter thinks she just forgot, 1 seizure many years ago, while driving. Presents today for evaluation accompanied by her daughter.  HISTORY 02/23/2020 SS: [name] is an 86 year old female history of meningioma status post resection.  She has not had any seizure events.  She is taking Topamax 50 mg twice a day.  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  She is on Coumadin, she says couple months ago she got a new pacemaker, that she thinks is MRI compatible.  She says she is aging, things are not working as well as they used to.  She has arthritis, pain in her shoulders, and knees.  She uses a cane for ambulation, she has not fallen.  She lives with her daughter, does her own ADLs, but her daughter is available for  supervision.  She reports some headaches in the afternoon occasionally, are not significant.  Her memory score was 28/30.  She did not take her blood pressure medications yet.  She presents today for follow-up accompanied by her daughter.   REVIEW OF SYSTEMS: Out of a complete 14 system review of symptoms, the patient complains only of the following symptoms, and all other reviewed systems are negative.  Headache, memory loss, seizures  ALLERGIES: Allergies  Allergen Reactions   Dilantin [Phenytoin Sodium Extended] Hives   Phenobarbital Hives          HOME MEDICATIONS: Outpatient Medications Prior to Visit  Medication Sig Dispense Refill   acetaminophen (TYLENOL) 500 MG tablet Take 500 mg by mouth daily as needed for moderate pain or headache.     allopurinol (ZYLOPRIM) 100 MG tablet Take 100 mg by mouth daily.     Carboxymethylcellul-Glycerin (LUBRICATING EYE DROPS OP) Place 1 drop into both eyes daily as needed (dry eyes).     colchicine 0.6 MG tablet Take 1 tablet (0.6 mg total) by mouth daily as needed (gout flare). 20 tablet 0   ferrous sulfate 325 (65 FE) MG tablet TAKE 1 TABLET (325 MG TOTAL) BY MOUTH EVERY MONDAY, WEDNESDAY, AND FRIDAY. 36 tablet 7   KLOR-CON M10 10 MEQ tablet TAKE 1 TABLET (10 MEQ TOTAL) BY MOUTH 2 (TWO) TIMES DAILY. TAKE AN EXTRA 20 MEQ WITH FUROSEMIDE. 240 tablet 3   metoprolol succinate (TOPROL-XL) 25 MG  24 hr tablet TAKE 1 TABLET BY MOUTH EVERY DAY 90 tablet 1   nitroGLYCERIN (NITROLINGUAL) 0.4 MG/SPRAY spray Place 3 sprays under the tongue every 5 (five) minutes x 3 doses as needed for chest pain. 12 g 3   omeprazole (PRILOSEC) 20 MG capsule Take 20 mg by mouth daily.     Polyethyl Glycol-Propyl Glycol (SYSTANE OP) Place 1 drop into both eyes at bedtime as needed (dry eyes).     pravastatin (PRAVACHOL) 20 MG tablet Take 20 mg by mouth at bedtime.     topiramate (TOPAMAX) 50 MG tablet Take 50 mg by mouth 2 (two) times daily.     torsemide  (DEMADEX) 20 MG tablet Take 2 tablets (40 mg total) by mouth daily. 60 tablet 11   traMADol (ULTRAM) 50 MG tablet Take 50 mg by mouth at bedtime as needed for moderate pain (sleep).      verapamil (VERELAN PM) 240 MG 24 hr capsule TAKE 1 CAPSULE (240 MG TOTAL) BY MOUTH AT BEDTIME. 90 capsule 2   warfarin (COUMADIN) 4 MG tablet Take 4 mg by mouth See admin instructions. Take 4 mg at night on Mon, Tue, Wed, Fri, and Sat     warfarin (COUMADIN) 5 MG tablet Take 5 mg by mouth See admin instructions. Take 5 mg at night on Sun and Thur     OVER THE COUNTER MEDICATION Place 6 drops under the tongue at bedtime as needed (anxiety/sleep). CBD oil     No facility-administered medications prior to visit.    PAST MEDICAL HISTORY: Past Medical History:  Diagnosis Date   ACL (anterior cruciate ligament) rupture    Left   Acute MI ([place])    Arthritis    Back pain    Secondary to arthritis   Benign paroxysmal positional vertigo 03/18/2016   Brain tumor [facility])    On right side. Removed in 1989   Bruit    Duplex doppler 03/28/05 Mildly abnormal. *Right subclavian artery: Less than 50% diameter reduction.   Chest pain    2D Echo 04/21/08 EF = >55%. Moderate tricuspid regurgitation. Persantine Myovoew stress test 04/21/08 EF = 81%, no evidence of ischemia noted.   Chronic anticoagulation    PAF   Chronic edema    Mild LE edema   Chronic kidney disease    Mild, stage 2   Coronary artery disease    Coronary atherosclerosis    Minimal   Dyslipidemia    GERD (gastroesophageal reflux disease)    Hyperlipidemia    On pravastatin   Memory difficulty 07/13/2014   Meningioma (HCC)    Resection in 1998   Mild depression (HCC)    Ovarian cyst    Benign. Had a salpiingoophorectomy in 2005.   PAF (paroxysmal atrial fibrillation) (HCC)    On coumadin. St. Jude PPM (serial [id number]) inserted 2010   Paroxysmal atrial flutter ([place])    Seizure ([place]) 2006   Severe obesity (HCC)    Sick  sinus syndrome ([place])    Sinus node dysfunction (HCC)    S/P implantation of a dual-chamber permanent pacemaker in 2010   SOB (shortness of breath) on exertion    Class II. Catheterization 04/30/10 showed mild CAD with mildly elevated right heart pressures.    Tachycardia-bradycardia syndrome ([place])    S/P implantation of dual-chamber permanent pacemaker in 2010   Trochlear nerve palsy    Right   Vertigo     PAST SURGICAL HISTORY: Past Surgical History:  Procedure Laterality Date  Brain tumor removal  1989   CARDIAC CATHETERIZATION  04/30/10   Showed mild CAD with mildly elevated right heart pressures. (Dr. Elisabeth Cara)   [place]   (Brain tumor) With gamma knife procedure as well.   KNEE SURGERY     Bilateral, arthroscopic   PACEMAKER INSERTION  04/27/09   Implanted by Dr. Elisabeth Cara. [place] (serial [id number])   PPM GENERATOR CHANGEOUT N/A 09/12/2019   Procedure: PPM GENERATOR CHANGEOUT;  Surgeon: Sanda Klein, MD;  Location: [facility];  Service: Cardiovascular;  Laterality: N/A;    FAMILY HISTORY: Family History  Problem Relation Age of Onset   Stroke Mother    Heart disease Father    Heart disease Sister    Heart disease Brother    Heart disease Brother    Heart disease Brother    Heart disease Sister    Heart disease Sister     SOCIAL HISTORY: Social History   Socioeconomic History   Marital status: Divorced    Spouse name: Not on file   Number of children: 3   Years of education: Not on file   Highest education level: Not on file  Occupational History    Comment: Retired  Tobacco Use   Smoking status: Former Smoker    Years: 20.00    Types: Cigarettes    Quit date: 12/23/2007    Years since quitting: 12.8   Smokeless tobacco: Never Used  Substance and Sexual Activity   Alcohol use: No    Alcohol/week: 0.0 standard drinks   Drug use: No   Sexual activity: Not on file  Other Topics Concern   Not on file    Social History Narrative   Not on file   Social Determinants of Health   Financial Resource Strain:    Difficulty of Paying Living Expenses: Not on file  Food Insecurity:    Worried About Charity fundraiser in the Last Year: Not on file   [employer] of Food in the Last Year: Not on file  Transportation Needs:    Lack of Transportation (Medical): Not on file   Lack of Transportation (Non-Medical): Not on file  Physical Activity:    Days of Exercise per Week: Not on file   Minutes of Exercise per Session: Not on file  Stress:    Feeling of Stress : Not on file  Social Connections:    Frequency of Communication with Friends and Family: Not on file   Frequency of Social Gatherings with Friends and Family: Not on file   Attends Religious Services: Not on file   Active Member of Clubs or Organizations: Not on file   Attends Archivist Meetings: Not on file   Marital Status: Not on file  Intimate Partner Violence:    Fear of Current or Ex-Partner: Not on file   Emotionally Abused: Not on file   Physically Abused: Not on file   Sexually Abused: Not on file   PHYSICAL EXAM  Vitals:   10/25/20 0851  BP: 128/72  Weight: 234 lb 9.6 oz (106.4 kg)  Height: 5' 4"  (1.626 m)   Body mass index is 40.27 kg/m.  Generalized: Well developed, in no acute distress   Neurological examination  Mentation: Alert oriented to time, place, history taking. Follows all commands speech and language fluent Cranial nerve II-XII: Pupils were equal round reactive to light. Extraocular movements were full, double vision is reported. Facial sensation and strength were normal. Head turning  and shoulder shrug  were normal and symmetric. Motor: Good strength throughout, no muscle weakness Sensory: Sensory testing is intact to soft touch on all 4 extremities. No evidence of extinction is noted.  Coordination: Cerebellar testing reveals good finger-nose-finger bilaterally, difficulty  with heel-to-shin due to orthopedic limitation Gait and station: Slowed seated position, is forward leaning, gait is wide-based, cautious, slow, uses single-point cane Reflexes: Deep tendon reflexes are symmetric   DIAGNOSTIC DATA (LABS, IMAGING, TESTING) - I reviewed patient records, labs, notes, testing and imaging myself where available.  Lab Results  Component Value Date   WBC 6.8 03/30/2020   HGB 14.7 03/30/2020   HCT 50.6 (H) 03/30/2020   MCV 97.3 03/30/2020   PLT 178 03/30/2020      Component Value Date/Time   NA 141 03/30/2020 2143   NA 145 (H) 09/06/2019 1214   K 5.0 03/30/2020 2143   CL 111 03/30/2020 2143   CO2 21 (L) 03/30/2020 2143   GLUCOSE 103 (H) 03/30/2020 2143   BUN 35 (H) 03/30/2020 2143   BUN 30 (H) 09/06/2019 1214   CREATININE 1.31 (H) 03/30/2020 2143   CREATININE 1.54 (H) 02/07/2016 1051   CALCIUM 10.2 03/30/2020 2143   PROT 6.7 03/30/2020 2143   ALBUMIN 3.7 03/30/2020 2143   AST 25 03/30/2020 2143   ALT 21 03/30/2020 2143   ALKPHOS 73 03/30/2020 2143   BILITOT 0.5 03/30/2020 2143   GFRNONAA 37 (L) 03/30/2020 2143   GFRAA 43 (L) 03/30/2020 2143   No results found for: CHOL, HDL, LDLCALC, LDLDIRECT, TRIG, CHOLHDL No results found for: HGBA1C No results found for: [place] PL[place] 86 y.o. year old female  has a past medical history of ACL (anterior cruciate ligament) rupture, Acute MI ([place]), Arthritis, Back pain, Benign paroxysmal positional vertigo (03/18/2016), Brain tumor ([place]), Bruit, Chest pain, Chronic anticoagulation, Chronic edema, Chronic kidney disease, Coronary artery disease, Coronary atherosclerosis, Dyslipidemia, GERD (gastroesophageal reflux disease), Hyperlipidemia, Memory difficulty (07/13/2014), Meningioma ([place]), Mild depression ([place]), Ovarian cyst, PAF (paroxysmal atrial fibrillation) ([place]), Paroxysmal atrial flutter ([place]), Seizure ([place]) (2006), Severe obesity ([place]), Sick sinus syndrome ([place]), Sinus node dysfunction ([place]),  SOB (shortness of breath) on exertion, Tachycardia-bradycardia syndrome ([place]), Trochlear nerve palsy, and Vertigo. here with:  1. History of multiple meningiomas 2. History of seizures 3. Gait disturbance 4. Mild memory disturbance 5. Reported headache  -Reporting mild headaches since at least March, located on right temple -CT head in September 2021 was overall stable, no acute abnormalities -Check ESR, CRP today -Recommend early follow-up with Dr. Kathlen Mody, for report of more double vision, hesitant to comply with his suggestions of patching or prisms -Unknown etiology of headache, hold off on adding another pill for now -Keep Topamax 50 mg twice a day, has some memory effects, but hesitant to switch, I think her memory is quite sharp today -Will have her follow-up in 3 months with Dr. [name], if headaches increase or worsen, she is to let me know, her function is otherwise at baseline  IMPRESSION: 08/28/2020 CT head (without) demonstrating:  - Stable right anterior temporal encephalomalacia and overlying post-craniotomy changes. - No acute findings.   I spent 30 minutes of face-to-face and non-face-to-face time with patient.  This included previsit chart review, lab review, study review, order entry, electronic health record documentation, patient education.  Butler Denmark, AGNP-C, DNP 10/25/2020, 10:01 AM [facility] [address], [place], [place] [address] [phone]

## 2020-10-25 NOTE — Patient Instructions (Addendum)
Check routine blood work  Please see your eye doctor  Continue the Topamax at current dosing Return in 3 months  If headaches worsen, need to let me know

## 2020-10-25 NOTE — Progress Notes (Signed)
I have read the note, and I agree with the clinical assessment and plan.  Cope Marte K Donyelle Enyeart   

## 2020-10-26 LAB — SEDIMENTATION RATE: Sed Rate: 71 mm/hr — ABNORMAL HIGH (ref 0–40)

## 2020-10-26 LAB — C-REACTIVE PROTEIN: CRP: 11 mg/L — ABNORMAL HIGH (ref 0–10)

## 2020-10-30 ENCOUNTER — Telehealth: Payer: Self-pay | Admitting: Neurology

## 2020-10-30 NOTE — Telephone Encounter (Signed)
I called pt and relayed that some labs results markers of inflammation did come back mildly elevated. Will recheck in 6 wks by phone.   (call as reminder).  She states only symptoms that she feels is around eye, nose, feels ore and swelling, (but does not look swollen).  Eye sometimes she states feel crossed.  (has appt with opthamologist Thursday. No fever, fatigue.

## 2020-10-30 NOTE — Telephone Encounter (Signed)
Please call, markers of inflammation, CRP, sed rate came back mildly elevated.  Checked in evaluation of right-sided headache.  Recommend recheck in 6 weeks, I will send myself a reminder.  Initially reported mild headache in April 2020, then again in March 2021.  Has had right 4th CN palsy since at least 2015 with double vision.  Verify any fatigue, fever, weight loss, jaw pain? Sed rate 71, CRP 11.

## 2020-11-02 ENCOUNTER — Other Ambulatory Visit: Payer: Self-pay

## 2020-11-02 ENCOUNTER — Ambulatory Visit (INDEPENDENT_AMBULATORY_CARE_PROVIDER_SITE_OTHER): Payer: Medicare Other | Admitting: Podiatry

## 2020-11-02 ENCOUNTER — Encounter: Payer: Self-pay | Admitting: Podiatry

## 2020-11-02 DIAGNOSIS — M79674 Pain in right toe(s): Secondary | ICD-10-CM

## 2020-11-02 DIAGNOSIS — B351 Tinea unguium: Secondary | ICD-10-CM

## 2020-11-02 DIAGNOSIS — M79675 Pain in left toe(s): Secondary | ICD-10-CM

## 2020-11-04 NOTE — Progress Notes (Signed)
Subjective: [name] is a 86 y.o. female patient seen today at [facility]. Patient has h/o CKD stage 3 and painful mycotic nails b/l that are difficult to trim. Pain interferes with ambulation. Aggravating factors include wearing enclosed shoe gear. Pain is relieved with periodic professional debridement.  Her daughter is accompanying her on today's visit. [name] voices no new pedal concerns on today's visit.  Patient Active Problem List   Diagnosis Date Noted  . Headache 10/25/2020  . Pacemaker battery depletion 09/12/2019  . Acute on chronic diastolic heart failure ([place]) 11/17/2018  . History of anticoagulant therapy 07/12/2018  . Abnormal C-reactive protein 07/12/2018  . Gastroesophageal reflux disease without esophagitis 07/12/2018  . History of cerebral meningioma 07/12/2018  . Hyperlipidemia 07/12/2018  . SSS (sick sinus syndrome) ([place]) 06/04/2016  . Meningioma, multiple ([place]) 03/18/2016  . Benign paroxysmal positional vertigo 03/18/2016  . Shoulder pain 10/10/2015  . Blood glucose abnormal 09/19/2015  . Gouty arthropathy 09/19/2015  . Vitamin D deficiency 09/19/2015  . Exertional shortness of breath 04/10/2015  . Memory difficulty 07/13/2014  . Angina, class III ([place]) 06/06/2014  . CAD (coronary artery disease) 06/06/2014  . Gout flare of right elbow 06/06/2014  . HTN (hypertension) 08/30/2013  . CKD (chronic kidney disease) stage 3, GFR 30-59 ml/min (HCC) 08/30/2013  . Seizure ([place]) 08/30/2013  . Obesity (BMI 30.0-34.9) 08/30/2013  . Pacemaker - dual chamber St. Jude RF device, implanted May 2010 08/30/2013  . Paroxysmal atrial fibrillation ([place]) 03/08/2013  . Long term current use of anticoagulant therapy 03/08/2013    Current Outpatient Medications on File Prior to Visit  Medication Sig Dispense Refill  . acetaminophen (TYLENOL) 500 MG tablet Take 500 mg by mouth daily as needed for moderate pain or headache.    . allopurinol (ZYLOPRIM) 100 MG tablet  Take 100 mg by mouth daily.    . Carboxymethylcellul-Glycerin (LUBRICATING EYE DROPS OP) Place 1 drop into both eyes daily as needed (dry eyes).    . colchicine 0.6 MG tablet Take 1 tablet (0.6 mg total) by mouth daily as needed (gout flare). 20 tablet 0  . ferrous sulfate 325 (65 FE) MG tablet TAKE 1 TABLET (325 MG TOTAL) BY MOUTH EVERY MONDAY, WEDNESDAY, AND FRIDAY. 36 tablet 7  . KLOR-CON M10 10 MEQ tablet TAKE 1 TABLET (10 MEQ TOTAL) BY MOUTH 2 (TWO) TIMES DAILY. TAKE AN EXTRA 20 MEQ WITH FUROSEMIDE. 240 tablet 3  . metoprolol succinate (TOPROL-XL) 25 MG 24 hr tablet TAKE 1 TABLET BY MOUTH EVERY DAY 90 tablet 1  . nitroGLYCERIN (NITROLINGUAL) 0.4 MG/SPRAY spray Place 3 sprays under the tongue every 5 (five) minutes x 3 doses as needed for chest pain. 12 g 3  . omeprazole (PRILOSEC) 20 MG capsule Take 20 mg by mouth daily.    Vladimir Faster Glycol-Propyl Glycol (SYSTANE OP) Place 1 drop into both eyes at bedtime as needed (dry eyes).    . pravastatin (PRAVACHOL) 20 MG tablet Take 20 mg by mouth at bedtime.    . topiramate (TOPAMAX) 50 MG tablet Take 50 mg by mouth 2 (two) times daily.    [name] torsemide (DEMADEX) 20 MG tablet Take 2 tablets (40 mg total) by mouth daily. 60 tablet 11  . traMADol (ULTRAM) 50 MG tablet Take 50 mg by mouth at bedtime as needed for moderate pain (sleep).     . verapamil (VERELAN PM) 240 MG 24 hr capsule TAKE 1 CAPSULE (240 MG TOTAL) BY MOUTH AT BEDTIME. 90 capsule  2  . warfarin (COUMADIN) 4 MG tablet Take 4 mg by mouth See admin instructions. Take 4 mg at night on Mon, Tue, Wed, Fri, and Sat    . warfarin (COUMADIN) 5 MG tablet Take 5 mg by mouth See admin instructions. Take 5 mg at night on Sun and Thur     No current facility-administered medications on file prior to visit.    Allergies  Allergen Reactions  . Dilantin [Phenytoin Sodium Extended] Hives  . Phenobarbital Hives          Objective: Physical Exam  General: [name] is a pleasant 86 y.o.  y.o. African American female, in NAD. AAO x 3.   Vascular:  Neurovascular status unchanged b/l lower extremities. Capillary fill time to digits <3 seconds b/l lower extremities. Faintly palpable pedal pulses b/l. Pedal hair absent. Lower extremity skin temperature gradient within normal limits. No pain with calf compression b/l. Nonpitting edema noted b/l lower extremities. Evidence of chronic venous insufficiency b/l LE. No warmth of calf b/l and edema of both LE is symmetrical.  Dermatological:  Pedal skin with normal turgor, texture and tone bilaterally. No open wounds bilaterally. Toenails 1-5 b/l elongated, dystrophic, thickened, crumbly with subungual debris and tenderness to dorsal palpation.  Musculoskeletal:  Normal muscle strength 5/5 to all lower extremity muscle groups bilaterally. No pain crepitus or joint limitation noted with ROM b/l. Hammertoes noted to the L 2nd toe and L 3rd toe.  Neurological:  Protective sensation intact 5/5 intact bilaterally with 10g monofilament b/l. Vibratory sensation intact b/l.  Assessment and Plan:  1. Pain due to onychomycosis of toenails of both feet    -Examined patient. -No new findings. No new orders. -Toenails 1-5 b/l were debrided in length and girth with sterile nail nippers and dremel without iatrogenic bleeding.  -Patient to continue soft, supportive shoe gear daily. -Patient/POA to call should there be question/concern in the interim.  Return in about 3 months (around 02/02/2021) for painful mycotic toenails.  Marzetta Board, DPM

## 2020-11-16 ENCOUNTER — Other Ambulatory Visit: Payer: Self-pay | Admitting: Cardiovascular Disease

## 2020-12-04 ENCOUNTER — Telehealth: Payer: Self-pay | Admitting: Neurology

## 2020-12-04 DIAGNOSIS — R519 Headache, unspecified: Secondary | ICD-10-CM

## 2020-12-04 NOTE — Telephone Encounter (Signed)
Spoke to pt and relayed to come in for repeat labs.  Gave her times days to come in.  She verbalized understanding.

## 2020-12-04 NOTE — Telephone Encounter (Addendum)
Please have her come in for repeat labs. Orders are in.  ----- Message from Suzzanne Cloud, NP sent at 10/30/2020  6:13 AM EST ----- Recheck ESR, CRP

## 2020-12-07 ENCOUNTER — Ambulatory Visit (INDEPENDENT_AMBULATORY_CARE_PROVIDER_SITE_OTHER): Payer: Medicare Other

## 2020-12-07 DIAGNOSIS — I495 Sick sinus syndrome: Secondary | ICD-10-CM

## 2020-12-07 LAB — CUP PACEART REMOTE DEVICE CHECK
Battery Remaining Longevity: 109 mo
Battery Remaining Percentage: 95.5 %
Battery Voltage: 3.01 V
Brady Statistic AP VP Percent: 1 %
Brady Statistic AP VS Percent: 96 %
Brady Statistic AS VP Percent: 1 %
Brady Statistic AS VS Percent: 3.3 %
Brady Statistic RA Percent Paced: 88 %
Brady Statistic RV Percent Paced: 4.6 %
Date Time Interrogation Session: 20211217020014
Implantable Lead Implant Date: 20100507
Implantable Lead Implant Date: 20100507
Implantable Lead Location: 753859
Implantable Lead Location: 753860
Implantable Pulse Generator Implant Date: 20200921
Lead Channel Impedance Value: 360 Ohm
Lead Channel Impedance Value: 460 Ohm
Lead Channel Pacing Threshold Amplitude: 0.75 V
Lead Channel Pacing Threshold Amplitude: 1 V
Lead Channel Pacing Threshold Pulse Width: 0.5 ms
Lead Channel Pacing Threshold Pulse Width: 0.5 ms
Lead Channel Sensing Intrinsic Amplitude: 12 mV
Lead Channel Sensing Intrinsic Amplitude: 3.6 mV
Lead Channel Setting Pacing Amplitude: 1.75 V
Lead Channel Setting Pacing Amplitude: 2.5 V
Lead Channel Setting Pacing Pulse Width: 0.5 ms
Lead Channel Setting Sensing Sensitivity: 4 mV
Pulse Gen Model: 2272
Pulse Gen Serial Number: 9166074

## 2020-12-11 ENCOUNTER — Other Ambulatory Visit: Payer: Self-pay

## 2020-12-11 ENCOUNTER — Other Ambulatory Visit (INDEPENDENT_AMBULATORY_CARE_PROVIDER_SITE_OTHER): Payer: Self-pay

## 2020-12-11 DIAGNOSIS — R519 Headache, unspecified: Secondary | ICD-10-CM

## 2020-12-11 DIAGNOSIS — Z0289 Encounter for other administrative examinations: Secondary | ICD-10-CM

## 2020-12-12 ENCOUNTER — Telehealth: Payer: Self-pay | Admitting: Neurology

## 2020-12-12 LAB — SEDIMENTATION RATE: Sed Rate: 56 mm/hr — ABNORMAL HIGH (ref 0–40)

## 2020-12-12 LAB — C-REACTIVE PROTEIN: CRP: 9 mg/L (ref 0–10)

## 2020-12-12 NOTE — Telephone Encounter (Signed)
I called the patient.  The sedimentation rate has dropped to 56, this is from 71, normal is less than 40.  The C-reactive protein is now normal.  The patient reports that she has only an occasional headache, once a month at this point.  The headaches have been off and on since March, I have a low suspicion for temporal arteritis.  We will follow over time.

## 2020-12-20 NOTE — Progress Notes (Signed)
Remote pacemaker transmission.   

## 2020-12-21 ENCOUNTER — Other Ambulatory Visit: Payer: Self-pay | Admitting: Cardiovascular Disease

## 2021-01-21 ENCOUNTER — Other Ambulatory Visit: Payer: Self-pay | Admitting: Cardiovascular Disease

## 2021-01-24 ENCOUNTER — Ambulatory Visit: Payer: Medicare Other | Admitting: Neurology

## 2021-01-24 ENCOUNTER — Encounter: Payer: Self-pay | Admitting: Neurology

## 2021-01-24 VITALS — Ht 65.0 in | Wt 229.4 lb

## 2021-01-24 DIAGNOSIS — R519 Headache, unspecified: Secondary | ICD-10-CM

## 2021-01-24 DIAGNOSIS — R413 Other amnesia: Secondary | ICD-10-CM

## 2021-01-24 DIAGNOSIS — D429 Neoplasm of uncertain behavior of meninges, unspecified: Secondary | ICD-10-CM | POA: Diagnosis not present

## 2021-01-24 NOTE — Progress Notes (Signed)
Reason for visit: Seizures, memory problems, gait disorder  [name] is an 87 y.o. female  History of present illness:  [name] is an 87 year old right-handed black female with a history of multiple meningioma, she has a history of seizures associated with this.  She has done well with Topamax taking 50 mg twice daily, but she does report some difficulty with memory and concentration.  The patient has had chronic issues with double vision, she has seen ophthalmology for prisms.  She does report some occasional headaches in the right frontal area that may come on once or twice a year, they stay for about 5 days then go away.  The patient has not had any recent seizures.  She has difficulty walking, she has had bilateral total hip replacements and she walks with a cane or a walker.  She has not had any recent falls.  She lives with her daughter.  She has low back pain is worse when she is stooped.  She does note occasional neck stiffness.  With the headaches, she denies any nausea or vomiting or photophobia or phonophobia.  She may take Ultram or Tylenol for the headache.  The patient does not operate a motor vehicle, she has a decrease in vision associated with macular degeneration.  Past Medical History:  Diagnosis Date  . ACL (anterior cruciate ligament) rupture    Left  . Acute MI ([place])   . Arthritis   . Back pain    Secondary to arthritis  . Benign paroxysmal positional vertigo 03/18/2016  . Brain tumor ([place])    On right side. Removed in 1989  . Bruit    Duplex doppler 03/28/05 Mildly abnormal. *Right subclavian artery: Less than 50% diameter reduction.  . Chest pain    2D Echo 04/21/08 EF = >55%. Moderate tricuspid regurgitation. Persantine Myovoew stress test 04/21/08 EF = 81%, no evidence of ischemia noted.  . Chronic anticoagulation    PAF  . Chronic edema    Mild LE edema  . Chronic kidney disease    Mild, stage 2  . Coronary artery disease   . Coronary  atherosclerosis    Minimal  . Dyslipidemia   . GERD (gastroesophageal reflux disease)   . Hyperlipidemia    On pravastatin  . Memory difficulty 07/13/2014  . Meningioma ([place])    Resection in 1998  . Mild depression ([place])   . Ovarian cyst    Benign. Had a salpiingoophorectomy in 2005.  [name] PAF (paroxysmal atrial fibrillation) (HCC)    On coumadin. St. Jude PPM (serial [id number]) inserted 2010  . Paroxysmal atrial flutter ([place])   . Seizure ([place]) 2006  . Severe obesity ([place])   . Sick sinus syndrome ([place])   . Sinus node dysfunction (HCC)    S/P implantation of a dual-chamber permanent pacemaker in 2010  . SOB (shortness of breath) on exertion    Class II. Catheterization 04/30/10 showed mild CAD with mildly elevated right heart pressures.   . Tachycardia-bradycardia syndrome ([place])    S/P implantation of dual-chamber permanent pacemaker in 2010  . Trochlear nerve palsy    Right  . Vertigo     Past Surgical History:  Procedure Laterality Date  . Brain tumor removal  1989  . CARDIAC CATHETERIZATION  04/30/10   Showed mild CAD with mildly elevated right heart pressures. (Dr. Elisabeth Cara)  . CRANIOTOMY  1989   (Brain tumor) With gamma knife procedure as well.  [name] KNEE SURGERY  Bilateral, arthroscopic  . PACEMAKER INSERTION  04/27/09   Implanted by Dr. Elisabeth Cara. [place] (serial [id number])  . PPM GENERATOR CHANGEOUT N/A 09/12/2019   Procedure: PPM GENERATOR CHANGEOUT;  Surgeon: Sanda Klein, MD;  Location: [facility];  Service: Cardiovascular;  Laterality: N/A;    Family History  Problem Relation Age of Onset  . Stroke Mother   . Heart disease Father   . Heart disease Sister   . Heart disease Brother   . Heart disease Brother   . Heart disease Brother   . Heart disease Sister   . Heart disease Sister     Social history:  reports that she quit smoking about 13 years ago. Her smoking use included cigarettes. She quit after 20.00 years of use. She has never used  smokeless tobacco. She reports that she does not drink alcohol and does not use drugs.    Allergies  Allergen Reactions  . Dilantin [Phenytoin Sodium Extended] Hives  . Phenobarbital Hives          Medications:  Prior to Admission medications   Medication Sig Start Date End Date Taking? Authorizing Provider  acetaminophen (TYLENOL) 500 MG tablet Take 500 mg by mouth daily as needed for moderate pain or headache.   Yes [provider]  allopurinol (ZYLOPRIM) 100 MG tablet Take 100 mg by mouth daily.   Yes [provider]  Carboxymethylcellul-Glycerin (LUBRICATING EYE DROPS OP) Place 1 drop into both eyes daily as needed (dry eyes).   Yes [provider]  colchicine 0.6 MG tablet Take 1 tablet (0.6 mg total) by mouth daily as needed (gout flare). 12/12/19  Yes Croitoru, Mihai, MD  ferrous sulfate 325 (65 FE) MG tablet TAKE 1 TABLET (325 MG TOTAL) BY MOUTH EVERY MONDAY, WEDNESDAY, AND FRIDAY. 10/05/19  Yes Croitoru, Mihai, MD  KLOR-CON M10 10 MEQ tablet TAKE 1 TABLET (10 MEQ TOTAL) BY MOUTH 2 (TWO) TIMES DAILY. TAKE AN EXTRA 20 MEQ WITH FUROSEMIDE. 09/03/20  Yes Croitoru, Mihai, MD  metoprolol succinate (TOPROL-XL) 25 MG 24 hr tablet TAKE 1 TABLET BY MOUTH EVERY DAY 12/24/20  Yes Croitoru, Mihai, MD  nitroGLYCERIN (NITROLINGUAL) 0.4 MG/SPRAY spray Place 3 sprays under the tongue every 5 (five) minutes x 3 doses as needed for chest pain. 05/20/18  Yes Croitoru, Mihai, MD  omeprazole (PRILOSEC) 20 MG capsule Take 20 mg by mouth daily.   Yes [provider]  Polyethyl Glycol-Propyl Glycol (SYSTANE OP) Place 1 drop into both eyes at bedtime as needed (dry eyes).   Yes [provider]  pravastatin (PRAVACHOL) 20 MG tablet Take 20 mg by mouth at bedtime.   Yes [provider]  topiramate (TOPAMAX) 50 MG tablet Take 50 mg by mouth 2 (two) times daily.   Yes [provider]  torsemide (DEMADEX) 20 MG tablet TAKE 2 TABLETS BY MOUTH EVERY DAY  01/21/21  Yes Croitoru, Mihai, MD  traMADol (ULTRAM) 50 MG tablet Take 50 mg by mouth at bedtime as needed for moderate pain (sleep).  01/05/20  Yes [provider]  verapamil (VERELAN PM) 240 MG 24 hr capsule TAKE 1 CAPSULE (240 MG TOTAL) BY MOUTH AT BEDTIME. 11/16/20  Yes Croitoru, Mihai, MD  warfarin (COUMADIN) 4 MG tablet Take 4 mg by mouth See admin instructions. Take 4 mg at night on Mon, Tue, Wed, Fri, and Sat   Yes [provider]  warfarin (COUMADIN) 5 MG tablet Take 5 mg by mouth See admin instructions. Take 5  mg at night on Sun and Thur   Yes [provider]    ROS:  Out of a complete 14 system review of symptoms, the patient complains only of the following symptoms, and all other reviewed systems are negative.  Headache Walking difficulty Memory problems  Height 5\' 5"  (1.651 m), weight 229 lb 6.4 oz (104.1 kg).  Physical Exam  General: The patient is alert and cooperative at the time of the examination.  The patient is markedly obese.  Skin: 2+ edema below the knees is seen bilaterally.   Neurologic Exam  Mental status: The patient is alert and oriented x 3 at the time of the examination. The patient has apparent normal recent and remote memory, with an apparently normal attention span and concentration ability.   Cranial nerves: Facial symmetry is present. Speech is normal, no aphasia or dysarthria is noted. Extraocular movements are full. Visual fields are full.  Motor: The patient has good strength in all 4 extremities, with exception of 4/5 strength with hip flexion bilaterally.  Sensory examination: Soft touch sensation is symmetric on the face, arms, and legs.  Coordination: The patient has good finger-nose-finger and heel-to-shin bilaterally.  Gait and station: The patient has significant difficulty getting up out of a chair independently.  Once up, she walks with a cane with a wide-based gait, slightly limping.  Tandem gait was not  attempted.  Reflexes: Deep tendon reflexes are symmetric, but are depressed.   Assessment/Plan:  1.  History of seizures, well controlled  2.  Occasional right frontotemporal headache  3.  Gait disorder  4.  Reported memory disorder  5.  History of meningioma  We have discussed options for seizure treatment.  The Topamax may be impairing her cognitive functioning to some degree.  We discussed switching to another medication such as Lamictal or Zonegran or even switching to long-acting topiramate to help the cognitive issues.  If the patient wishes to initiate the switch and she will call our office.  The patient will follow up here in 6 months.  She does have headaches but they are very infrequent and have been present for a number of years.  Jill Alexanders MD 01/24/2021 9:11 AM  [facility] [address] [place], [place] [address]  Phone [phone] Fax [phone]

## 2021-01-24 NOTE — Patient Instructions (Signed)
Consider change to [place].

## 2021-02-11 ENCOUNTER — Ambulatory Visit: Payer: Medicare Other | Admitting: Podiatry

## 2021-03-08 ENCOUNTER — Ambulatory Visit (INDEPENDENT_AMBULATORY_CARE_PROVIDER_SITE_OTHER): Payer: Medicare Other

## 2021-03-08 DIAGNOSIS — I495 Sick sinus syndrome: Secondary | ICD-10-CM

## 2021-03-08 LAB — CUP PACEART REMOTE DEVICE CHECK
Battery Remaining Longevity: 109 mo
Battery Remaining Percentage: 95.5 %
Battery Voltage: 3.01 V
Brady Statistic AP VP Percent: 1.4 %
Brady Statistic AP VS Percent: 96 %
Brady Statistic AS VP Percent: 1 %
Brady Statistic AS VS Percent: 2.7 %
Brady Statistic RA Percent Paced: 90 %
Brady Statistic RV Percent Paced: 4.5 %
Date Time Interrogation Session: 20220318020015
Implantable Lead Implant Date: 20100507
Implantable Lead Implant Date: 20100507
Implantable Lead Location: 753859
Implantable Lead Location: 753860
Implantable Pulse Generator Implant Date: 20200921
Lead Channel Impedance Value: 380 Ohm
Lead Channel Impedance Value: 450 Ohm
Lead Channel Pacing Threshold Amplitude: 0.625 V
Lead Channel Pacing Threshold Amplitude: 1 V
Lead Channel Pacing Threshold Pulse Width: 0.5 ms
Lead Channel Pacing Threshold Pulse Width: 0.5 ms
Lead Channel Sensing Intrinsic Amplitude: 11.3 mV
Lead Channel Sensing Intrinsic Amplitude: 3.8 mV
Lead Channel Setting Pacing Amplitude: 1.625
Lead Channel Setting Pacing Amplitude: 2.5 V
Lead Channel Setting Pacing Pulse Width: 0.5 ms
Lead Channel Setting Sensing Sensitivity: 4 mV
Pulse Gen Model: 2272
Pulse Gen Serial Number: 9166074

## 2021-03-15 NOTE — Progress Notes (Signed)
Remote pacemaker transmission.   

## 2021-05-21 ENCOUNTER — Encounter: Payer: Self-pay | Admitting: Podiatry

## 2021-05-21 ENCOUNTER — Other Ambulatory Visit: Payer: Self-pay

## 2021-05-21 ENCOUNTER — Ambulatory Visit: Payer: Medicare Other | Admitting: Podiatry

## 2021-05-21 DIAGNOSIS — M79674 Pain in right toe(s): Secondary | ICD-10-CM

## 2021-05-21 DIAGNOSIS — D689 Coagulation defect, unspecified: Secondary | ICD-10-CM

## 2021-05-21 DIAGNOSIS — B351 Tinea unguium: Secondary | ICD-10-CM

## 2021-05-21 DIAGNOSIS — M79675 Pain in left toe(s): Secondary | ICD-10-CM | POA: Diagnosis not present

## 2021-05-21 NOTE — Progress Notes (Signed)
This patient returns to my office for at risk foot care.  This patient requires this care by a professional since this patient will be at risk due to having coagulation defect and  CKD   This patient is unable to cut nails herself since the patient cannot reach her nails.These nails are painful walking and wearing shoes.  This patient presents for at risk foot care today. Patient presents to the office with her daughter.  General Appearance  Alert, conversant and in no acute stress.  Vascular  Dorsalis pedis and posterior tibial  pulses are not  palpable  Left foot.  Dorsalis pedis pulses are weakly palpable right foot due to swelling..  Capillary return is within normal limits  bilaterally. Temperature is within normal limits  bilaterally.  Neurologic  Senn-Weinstein monofilament wire test within normal limits  bilaterally. Muscle power within normal limits bilaterally.  Nails Thick disfigured discolored nails with subungual debris  from hallux to fifth toes bilaterally. No evidence of bacterial infection or drainage bilaterally.  Orthopedic  No limitations of motion  feet .  No crepitus or effusions noted.  No bony pathology or digital deformities noted.  Skin  normotropic skin with no porokeratosis noted bilaterally.  No signs of infections or ulcers noted.     Onychomycosis  Pain in right toes  Pain in left toes  Consent was obtained for treatment procedures.   Mechanical debridement of nails 1-5  bilaterally performed with a nail nipper.  Filed with dremel without incident.    Return office visit  3 months.                   Told patient to return for periodic foot care and evaluation due to potential at risk complications.   Gardiner Barefoot DPM

## 2021-05-22 ENCOUNTER — Ambulatory Visit: Payer: Medicare Other | Admitting: Podiatry

## 2021-06-07 ENCOUNTER — Ambulatory Visit (INDEPENDENT_AMBULATORY_CARE_PROVIDER_SITE_OTHER): Payer: Medicare Other

## 2021-06-07 DIAGNOSIS — I495 Sick sinus syndrome: Secondary | ICD-10-CM

## 2021-06-07 LAB — CUP PACEART REMOTE DEVICE CHECK
Battery Remaining Longevity: 110 mo
Battery Remaining Percentage: 95.5 %
Battery Voltage: 3.01 V
Brady Statistic AP VP Percent: 1.3 %
Brady Statistic AP VS Percent: 96 %
Brady Statistic AS VP Percent: 1 %
Brady Statistic AS VS Percent: 2.8 %
Brady Statistic RA Percent Paced: 90 %
Brady Statistic RV Percent Paced: 4.7 %
Date Time Interrogation Session: 20220617020014
Implantable Lead Implant Date: 20100507
Implantable Lead Implant Date: 20100507
Implantable Lead Location: 753859
Implantable Lead Location: 753860
Implantable Pulse Generator Implant Date: 20200921
Lead Channel Impedance Value: 360 Ohm
Lead Channel Impedance Value: 490 Ohm
Lead Channel Pacing Threshold Amplitude: 0.625 V
Lead Channel Pacing Threshold Amplitude: 1 V
Lead Channel Pacing Threshold Pulse Width: 0.5 ms
Lead Channel Pacing Threshold Pulse Width: 0.5 ms
Lead Channel Sensing Intrinsic Amplitude: 12 mV
Lead Channel Sensing Intrinsic Amplitude: 3.4 mV
Lead Channel Setting Pacing Amplitude: 1.625
Lead Channel Setting Pacing Amplitude: 2.5 V
Lead Channel Setting Pacing Pulse Width: 0.5 ms
Lead Channel Setting Sensing Sensitivity: 4 mV
Pulse Gen Model: 2272
Pulse Gen Serial Number: 9166074

## 2021-06-16 ENCOUNTER — Other Ambulatory Visit: Payer: Self-pay | Admitting: Cardiovascular Disease

## 2021-06-26 ENCOUNTER — Other Ambulatory Visit: Payer: Self-pay | Admitting: Cardiovascular Disease

## 2021-06-26 NOTE — Progress Notes (Signed)
Remote pacemaker transmission.   

## 2021-08-01 ENCOUNTER — Ambulatory Visit: Payer: Medicare Other | Admitting: Neurology

## 2021-08-10 ENCOUNTER — Other Ambulatory Visit: Payer: Self-pay | Admitting: Cardiovascular Disease

## 2021-08-21 ENCOUNTER — Ambulatory Visit: Payer: Medicare Other | Admitting: Podiatry

## 2021-08-21 ENCOUNTER — Other Ambulatory Visit: Payer: Self-pay

## 2021-08-21 ENCOUNTER — Encounter: Payer: Self-pay | Admitting: Podiatry

## 2021-08-21 DIAGNOSIS — D689 Coagulation defect, unspecified: Secondary | ICD-10-CM | POA: Diagnosis not present

## 2021-08-21 DIAGNOSIS — N183 Chronic kidney disease, stage 3 unspecified: Secondary | ICD-10-CM

## 2021-08-21 DIAGNOSIS — B351 Tinea unguium: Secondary | ICD-10-CM

## 2021-08-21 DIAGNOSIS — M79675 Pain in left toe(s): Secondary | ICD-10-CM | POA: Diagnosis not present

## 2021-08-21 DIAGNOSIS — M79674 Pain in right toe(s): Secondary | ICD-10-CM

## 2021-08-21 NOTE — Progress Notes (Signed)
This patient returns to my office for at risk foot care.  This patient requires this care by a professional since this patient will be at risk due to having coagulation defect and  CKD   This patient is unable to cut nails herself since the patient cannot reach her nails.These nails are painful walking and wearing shoes.  This patient presents for at risk foot care today. Patient presents to the office with her daughter.  General Appearance  Alert, conversant and in no acute stress.  Vascular  Dorsalis pedis and posterior tibial  pulses are not  palpable  Left foot.  Dorsalis pedis pulses are weakly palpable right foot due to swelling..  Capillary return is within normal limits  bilaterally. Temperature is within normal limits  bilaterally.  Neurologic  Senn-Weinstein monofilament wire test within normal limits  bilaterally. Muscle power within normal limits bilaterally.  Nails Thick disfigured discolored nails with subungual debris  from hallux to fifth toes bilaterally. No evidence of bacterial infection or drainage bilaterally.  Orthopedic  No limitations of motion  feet .  No crepitus or effusions noted.  No bony pathology or digital deformities noted.  Skin  normotropic skin with no porokeratosis noted bilaterally.  No signs of infections or ulcers noted.     Onychomycosis  Pain in right toes  Pain in left toes  Consent was obtained for treatment procedures.   Mechanical debridement of nails 1-5  bilaterally performed with a nail nipper.  Filed with dremel without incident.    Return office visit  3 months.                   Told patient to return for periodic foot care and evaluation due to potential at risk complications.   Gardiner Barefoot DPM

## 2021-08-27 ENCOUNTER — Ambulatory Visit: Payer: Medicare Other | Admitting: Adult Health

## 2021-08-27 ENCOUNTER — Encounter: Payer: Self-pay | Admitting: Adult Health

## 2021-08-27 VITALS — BP 125/77 | HR 60 | Ht 64.0 in | Wt 229.2 lb

## 2021-08-27 DIAGNOSIS — R569 Unspecified convulsions: Secondary | ICD-10-CM | POA: Diagnosis not present

## 2021-08-27 NOTE — Patient Instructions (Signed)
Your Plan:  Continue Topamax 50 mg twice a day If your symptoms worsen or you develop new symptoms please let us know.   Thank you for coming to see [name] at [facility]. I hope we have been able to provide you high quality care today.  You may receive a patient satisfaction survey over the next few weeks. We would appreciate your feedback and comments so that we may continue to improve ourselves and the health of our patients.

## 2021-08-27 NOTE — Progress Notes (Signed)
PATIENT: [name] DOB: 1932-05-27  REASON FOR VISIT: follow up HISTORY FROM: patient PRIMARY NEUROLOGIST: [name]  HISTORY OF PRESENT ILLNESS: Today 08/27/21:  [name] is an 87 year old female with a history of multiple meningioma and seizures.  She returns today for follow-up.  She is currently taking Topamax 50 mg twice a day.  At the last visit she was reporting some cognitive changes and [name] suggested changing her to long-acting Topamax or to Zonegran or Lamictal.  The patient did not want to change medication.  The patient's daughter currently lives with her.  She does not operate a motor vehicle.  The patient is mainly concerned that if she switches medication she could have a breakthrough seizure.  The daughter that is with her today does not notice her memory issues.  Patient reports headaches have remained stable  HISTORY (copied from [name] note)  [name] is an 87 year old right-handed black female with a history of multiple meningioma, she has a history of seizures associated with this.  She has done well with Topamax taking 50 mg twice daily, but she does report some difficulty with memory and concentration.  The patient has had chronic issues with double vision, she has seen ophthalmology for prisms.  She does report some occasional headaches in the right frontal area that may come on once or twice a year, they stay for about 5 days then go away.  The patient has not had any recent seizures.  She has difficulty walking, she has had bilateral total hip replacements and she walks with a cane or a walker.  She has not had any recent falls.  She lives with her daughter.  She has low back pain is worse when she is stooped.  She does note occasional neck stiffness.  With the headaches, she denies any nausea or vomiting or photophobia or phonophobia.  She may take Ultram or Tylenol for the headache.  The patient does not operate a motor vehicle, she has a  decrease in vision associated with macular degeneration.   REVIEW OF SYSTEMS: Out of a complete 14 system review of symptoms, the patient complains only of the following symptoms, and all other reviewed systems are negative.  ALLERGIES: Allergies  Allergen Reactions   Dilantin [Phenytoin Sodium Extended] Hives   Phenobarbital Hives          HOME MEDICATIONS: Outpatient Medications Prior to Visit  Medication Sig Dispense Refill   acetaminophen (TYLENOL) 500 MG tablet Take 500 mg by mouth daily as needed for moderate pain or headache.     allopurinol (ZYLOPRIM) 100 MG tablet Take 100 mg by mouth daily.     Carboxymethylcellul-Glycerin (LUBRICATING EYE DROPS OP) Place 1 drop into both eyes daily as needed (dry eyes).     colchicine 0.6 MG tablet Take 1 tablet (0.6 mg total) by mouth daily as needed (gout flare). 20 tablet 0   KLOR-CON M10 10 MEQ tablet TAKE 1 TABLET (10 MEQ TOTAL) BY MOUTH 2 (TWO) TIMES DAILY. TAKE AN EXTRA 20 MEQ WITH FUROSEMIDE. 240 tablet 3   metoprolol succinate (TOPROL-XL) 25 MG 24 hr tablet TAKE 1 TABLET BY MOUTH EVERY DAY 60 tablet 0   nitroGLYCERIN (NITROLINGUAL) 0.4 MG/SPRAY spray Place 3 sprays under the tongue every 5 (five) minutes x 3 doses as needed for chest pain. 12 g 3   omeprazole (PRILOSEC) 20 MG capsule Take 20 mg by mouth daily.     Polyethyl Glycol-Propyl Glycol (SYSTANE OP) Place  1 drop into both eyes at bedtime as needed (dry eyes).     pravastatin (PRAVACHOL) 20 MG tablet Take 20 mg by mouth at bedtime.     topiramate (TOPAMAX) 50 MG tablet Take 50 mg by mouth 2 (two) times daily.     torsemide (DEMADEX) 20 MG tablet TAKE 2 TABLETS BY MOUTH EVERY DAY 180 tablet 3   traMADol (ULTRAM) 50 MG tablet Take 50 mg by mouth at bedtime as needed for moderate pain (sleep).      verapamil (VERELAN PM) 240 MG 24 hr capsule TAKE 1 CAPSULE (240 MG TOTAL) BY MOUTH AT BEDTIME. 90 capsule 2   warfarin (COUMADIN) 4 MG tablet Take 4 mg by mouth See admin  instructions. Take 4 mg at night on Mon, Tue, Wed, Fri, and Sat     warfarin (COUMADIN) 5 MG tablet Take 5 mg by mouth See admin instructions. Take 5 mg at night on Sun and Thur     ferrous sulfate 325 (65 FE) MG tablet TAKE 1 TABLET (325 MG TOTAL) BY MOUTH EVERY MONDAY, WEDNESDAY, AND FRIDAY. 36 tablet 7   No facility-administered medications prior to visit.    PAST MEDICAL HISTORY: Past Medical History:  Diagnosis Date   ACL (anterior cruciate ligament) rupture    Left   Acute MI ([place])    Arthritis    Back pain    Secondary to arthritis   Benign paroxysmal positional vertigo 03/18/2016   Brain tumor [facility])    On right side. Removed in 1989   Bruit    Duplex doppler 03/28/05 Mildly abnormal. *Right subclavian artery: Less than 50% diameter reduction.   Chest pain    2D Echo 04/21/08 EF = >55%. Moderate tricuspid regurgitation. Persantine Myovoew stress test 04/21/08 EF = 81%, no evidence of ischemia noted.   Chronic anticoagulation    PAF   Chronic edema    Mild LE edema   Chronic kidney disease    Mild, stage 2   Coronary artery disease    Coronary atherosclerosis    Minimal   Dyslipidemia    GERD (gastroesophageal reflux disease)    Hyperlipidemia    On pravastatin   Memory difficulty 07/13/2014   Meningioma (HCC)    Resection in 1998   Mild depression (HCC)    Ovarian cyst    Benign. Had a salpiingoophorectomy in 2005.   PAF (paroxysmal atrial fibrillation) (HCC)    On coumadin. St. Jude PPM (serial [id number]) inserted 2010   Paroxysmal atrial flutter ([place])    Seizure ([place]) 2006   Severe obesity (HCC)    Sick sinus syndrome ([place])    Sinus node dysfunction (HCC)    S/P implantation of a dual-chamber permanent pacemaker in 2010   SOB (shortness of breath) on exertion    Class II. Catheterization 04/30/10 showed mild CAD with mildly elevated right heart pressures.    Tachycardia-bradycardia syndrome ([place])    S/P implantation of dual-chamber permanent pacemaker in 2010    Trochlear nerve palsy    Right   Vertigo     PAST SURGICAL HISTORY: Past Surgical History:  Procedure Laterality Date   Brain tumor removal  1989   CARDIAC CATHETERIZATION  04/30/10   Showed mild CAD with mildly elevated right heart pressures. (Dr. Elisabeth Cara)   [place]   (Brain tumor) With gamma knife procedure as well.   KNEE SURGERY     Bilateral, arthroscopic   PACEMAKER INSERTION  04/27/09   Implanted by Dr. Elisabeth Cara.  [place] (serial [id number])   PPM GENERATOR CHANGEOUT N/A 09/12/2019   Procedure: PPM GENERATOR CHANGEOUT;  Surgeon: Sanda Klein, MD;  Location: [facility];  Service: Cardiovascular;  Laterality: N/A;    FAMILY HISTORY: Family History  Problem Relation Age of Onset   Stroke Mother    Heart disease Father    Heart disease Sister    Heart disease Brother    Heart disease Brother    Heart disease Brother    Heart disease Sister    Heart disease Sister     SOCIAL HISTORY: Social History   Socioeconomic History   Marital status: Divorced    Spouse name: Not on file   Number of children: 3   Years of education: Not on file   Highest education level: Not on file  Occupational History    Comment: Retired  Tobacco Use   Smoking status: Former    Years: 20.00    Types: Cigarettes    Quit date: 12/23/2007    Years since quitting: 13.6   Smokeless tobacco: Never  Substance and Sexual Activity   Alcohol use: No    Alcohol/week: 0.0 standard drinks   Drug use: No   Sexual activity: Not on file  Other Topics Concern   Not on file  Social History Narrative   Lives with daughter   Right Handed   Drinks 1-2 cups caffeine daily   Social Determinants of Health   Financial Resource Strain: Not on file  Food Insecurity: Not on file  Transportation Needs: Not on file  Physical Activity: Not on file  Stress: Not on file  Social Connections: Not on file  Intimate Partner Violence: Not on file      PHYSICAL EXAM  Vitals:    08/27/21 0845  BP: 125/77  Pulse: 60  SpO2: 95%  Weight: 229 lb 3.2 oz (104 kg)  Height: '5\' 4"'$  (1.626 m)   Body mass index is 39.34 kg/m.  Generalized: Well developed, in no acute distress   Neurological examination  Mentation: Alert oriented to time, place, history taking. Follows all commands speech and language fluent Cranial nerve II-XII: Pupils were equal round reactive to light. Extraocular movements were full, visual field were full on confrontational test. Facial sensation and strength were normal. Uvula tongue midline. Head turning and shoulder shrug  were normal and symmetric. Motor: The motor testing reveals 5 over 5 strength of all 4 extremities. Good symmetric motor tone is noted throughout.  Sensory: Sensory testing is intact to soft touch on all 4 extremities. No evidence of extinction is noted.  Coordination: Cerebellar testing reveals good finger-nose-finger and heel-to-shin bilaterally.  Gait and station: Gait is normal.  Reflexes: Deep tendon reflexes are symmetric and normal bilaterally.   DIAGNOSTIC DATA (LABS, IMAGING, TESTING) - I reviewed patient records, labs, notes, testing and imaging myself where available.  Lab Results  Component Value Date   WBC 6.8 03/30/2020   HGB 14.7 03/30/2020   HCT 50.6 (H) 03/30/2020   MCV 97.3 03/30/2020   PLT 178 03/30/2020      Component Value Date/Time   NA 141 03/30/2020 2143   NA 145 (H) 09/06/2019 1214   K 5.0 03/30/2020 2143   CL 111 03/30/2020 2143   CO2 21 (L) 03/30/2020 2143   GLUCOSE 103 (H) 03/30/2020 2143   BUN 35 (H) 03/30/2020 2143   BUN 30 (H) 09/06/2019 1214   CREATININE 1.31 (H) 03/30/2020 2143   CREATININE 1.54 (H) 02/07/2016  1051   CALCIUM 10.2 03/30/2020 2143   PROT 6.7 03/30/2020 2143   ALBUMIN 3.7 03/30/2020 2143   AST 25 03/30/2020 2143   ALT 21 03/30/2020 2143   ALKPHOS 73 03/30/2020 2143   BILITOT 0.5 03/30/2020 2143   GFRNONAA 37 (L) 03/30/2020 2143   GFRAA 43 (L) 03/30/2020 2143         ASSESSMENT AND PLAN 87 y.o. year old female  has a past medical history of ACL (anterior cruciate ligament) rupture, Acute MI ([place]), Arthritis, Back pain, Benign paroxysmal positional vertigo (03/18/2016), Brain tumor ([place]), Bruit, Chest pain, Chronic anticoagulation, Chronic edema, Chronic kidney disease, Coronary artery disease, Coronary atherosclerosis, Dyslipidemia, GERD (gastroesophageal reflux disease), Hyperlipidemia, Memory difficulty (07/13/2014), Meningioma ([place]), Mild depression ([place]), Ovarian cyst, PAF (paroxysmal atrial fibrillation) ([place]), Paroxysmal atrial flutter ([place]), Seizure ([place]) (2006), Severe obesity ([place]), Sick sinus syndrome ([place]), Sinus node dysfunction ([place]), SOB (shortness of breath) on exertion, Tachycardia-bradycardia syndrome ([place]), Trochlear nerve palsy, and Vertigo. here with :  Seizures  -Continue Topamax 50 mg twice a day -At this time the patient is hesitant to switch her medication for fear of a breakthrough seizure but she does notice some cognitive changes not sure if it is age-related or medication related. -Patient has requested to follow-up in 3 months to readdress.   Ward Givens, MSN, NP-C 08/27/2021, 9:01 AM [facility] [address] [place], [place] [address] [phone]

## 2021-08-27 NOTE — Progress Notes (Signed)
I have read the note, and I agree with the clinical assessment and plan.  Muhsin Doris K Shakari Qazi   

## 2021-09-06 ENCOUNTER — Ambulatory Visit (INDEPENDENT_AMBULATORY_CARE_PROVIDER_SITE_OTHER): Payer: Medicare Other

## 2021-09-06 DIAGNOSIS — I495 Sick sinus syndrome: Secondary | ICD-10-CM | POA: Diagnosis not present

## 2021-09-08 LAB — CUP PACEART REMOTE DEVICE CHECK
Battery Remaining Longevity: 91 mo
Battery Remaining Percentage: 84 %
Battery Voltage: 3.01 V
Brady Statistic AP VP Percent: 1.5 %
Brady Statistic AP VS Percent: 96 %
Brady Statistic AS VP Percent: 1 %
Brady Statistic AS VS Percent: 2.6 %
Brady Statistic RA Percent Paced: 89 %
Brady Statistic RV Percent Paced: 5.6 %
Date Time Interrogation Session: 20220916020017
Implantable Lead Implant Date: 20100507
Implantable Lead Implant Date: 20100507
Implantable Lead Location: 753859
Implantable Lead Location: 753860
Implantable Pulse Generator Implant Date: 20200921
Lead Channel Impedance Value: 380 Ohm
Lead Channel Impedance Value: 540 Ohm
Lead Channel Pacing Threshold Amplitude: 0.5 V
Lead Channel Pacing Threshold Amplitude: 1 V
Lead Channel Pacing Threshold Pulse Width: 0.5 ms
Lead Channel Pacing Threshold Pulse Width: 0.5 ms
Lead Channel Sensing Intrinsic Amplitude: 12 mV
Lead Channel Sensing Intrinsic Amplitude: 4.3 mV
Lead Channel Setting Pacing Amplitude: 1.5 V
Lead Channel Setting Pacing Amplitude: 2.5 V
Lead Channel Setting Pacing Pulse Width: 0.5 ms
Lead Channel Setting Sensing Sensitivity: 4 mV
Pulse Gen Model: 2272
Pulse Gen Serial Number: 9166074

## 2021-09-11 NOTE — Progress Notes (Signed)
Remote pacemaker transmission.   

## 2021-10-09 ENCOUNTER — Other Ambulatory Visit: Payer: Self-pay | Admitting: Cardiovascular Disease

## 2021-10-12 ENCOUNTER — Other Ambulatory Visit: Payer: Self-pay | Admitting: Cardiovascular Disease

## 2021-10-31 ENCOUNTER — Other Ambulatory Visit: Payer: Self-pay | Admitting: Cardiovascular Disease

## 2021-11-19 ENCOUNTER — Other Ambulatory Visit: Payer: Self-pay

## 2021-11-19 ENCOUNTER — Ambulatory Visit (INDEPENDENT_AMBULATORY_CARE_PROVIDER_SITE_OTHER): Payer: Medicare Other | Admitting: Cardiovascular Disease

## 2021-11-19 ENCOUNTER — Encounter: Payer: Self-pay | Admitting: Cardiovascular Disease

## 2021-11-19 VITALS — BP 120/62 | HR 65 | Ht 64.0 in | Wt 229.2 lb

## 2021-11-19 DIAGNOSIS — I495 Sick sinus syndrome: Secondary | ICD-10-CM | POA: Diagnosis not present

## 2021-11-19 DIAGNOSIS — N1832 Chronic kidney disease, stage 3b: Secondary | ICD-10-CM

## 2021-11-19 DIAGNOSIS — E7800 Pure hypercholesterolemia, unspecified: Secondary | ICD-10-CM

## 2021-11-19 DIAGNOSIS — Z7901 Long term (current) use of anticoagulants: Secondary | ICD-10-CM

## 2021-11-19 DIAGNOSIS — I480 Paroxysmal atrial fibrillation: Secondary | ICD-10-CM

## 2021-11-19 DIAGNOSIS — I10 Essential (primary) hypertension: Secondary | ICD-10-CM

## 2021-11-19 DIAGNOSIS — I2510 Atherosclerotic heart disease of native coronary artery without angina pectoris: Secondary | ICD-10-CM

## 2021-11-19 DIAGNOSIS — Z950 Presence of cardiac pacemaker: Secondary | ICD-10-CM

## 2021-11-19 DIAGNOSIS — I5032 Chronic diastolic (congestive) heart failure: Secondary | ICD-10-CM

## 2021-11-19 MED ORDER — TORSEMIDE 20 MG PO TABS: 40.0000 mg | ORAL_TABLET | Freq: Every day | ORAL | 5 refills | Status: DC

## 2021-11-19 MED ORDER — POTASSIUM CHLORIDE CRYS ER 10 MEQ PO TBCR: 20.0000 meq | EXTENDED_RELEASE_TABLET | Freq: Two times a day (BID) | ORAL | 5 refills | Status: DC

## 2021-11-19 NOTE — Patient Instructions (Addendum)
Medication Instructions:  TORSEMIDE: Take 40 mg once daily (2 of the 20 mg tablets) POTASSIUM: Take 20 mEq twice daily (2 of the 10 mEq tablets twice daily)  *If you need a refill on your cardiac medications before your next appointment, please call your pharmacy*   Lab Work: Your provider would like for you to return in 2 weeks to have the following labs drawn: BMET and BNP. You do not need an appointment for the lab. Once in our office lobby there is a podium where you can sign in and ring the doorbell to alert [name] that you are here. The lab is open from 8:00 am to 4:30 pm; closed for lunch from 12:45pm-1:45pm.  If you have labs (blood work) drawn today and your tests are completely normal, you will receive your results only by: [place] (if you have MyChart) OR A paper copy in the mail If you have any lab test that is abnormal or we need to change your treatment, we will call you to review the results.   Testing/Procedures: None ordered   Follow-Up: At [facility], you and your health needs are our priority.  As part of our continuing mission to provide you with exceptional heart care, we have created designated Provider Care Teams.  These Care Teams include your primary Cardiologist (physician) and Advanced Practice Providers (APPs -  Physician Assistants and Nurse Practitioners) who all work together to provide you with the care you need, when you need it.  We recommend signing up for the patient portal called "MyChart".  Sign up information is provided on this After Visit Summary.  MyChart is used to connect with patients for Virtual Visits (Telemedicine).  Patients are able to view lab/test results, encounter notes, upcoming appointments, etc.  Non-urgent messages can be sent to your provider as well.   To learn more about what you can do with MyChart, go to [email or web address].    Your next appointment:   3 months with Tommye Standard 6 months with Dr. Sallyanne Kuster :1}     Other Instructions Your physician recommends that you weigh yourself everyday at the same time, on the same scale and with the same amount of clothing. Please keep a record of these weights and bring to your next appointment.

## 2021-11-19 NOTE — Progress Notes (Deleted)
Cardiology Office Note Date:  11/19/2021  Patient ID:  [name], Glover 1932-02-06, MRN [id number] PCP:  Willey Blade, MD  Cardiologist:  Dr. Sallyanne Kuster  ***refresh   Chief Complaint: *** 6 mo  History of Present Illness: [name] is a 87 y.o. female with history of Afib, tachy-brady w/PPM, seizures (s/p resection of multiple meningiomas historically).  No significant CAD by cath 2011, HTN, HLD, GERD, CKD (IIIb), obesity  She comes in today to be seen for Dr. Sallyanne Kuster, last seen by him May 2021.  At that time, no cardiac complaints, she was in Afib and unaware.  Daughter was with her, frustrated over her Mom's lack of willingness to eat healthy, exercise, etc.  Pt mostly bothered by shoulder pain (and suspect rotator cuff injury).  Historically with waxing/waning edema requiring diuretic management, probably 2/2 diastolic dysfunction.  Noted increased AF burden, rate controlled, asymptomatic, no changes planned.    *** symptoms *** AF burden, rates *** warfarin, bleeding, PCP *** volume   Device information Abbott dual chamber PPM implanted 04/27/2009, gen change 09/12/2019   Past Medical History:  Diagnosis Date   ACL (anterior cruciate ligament) rupture    Left   Acute MI ([place])    Arthritis    Back pain    Secondary to arthritis   Benign paroxysmal positional vertigo 03/18/2016   Brain tumor ([place])    On right side. Removed in 1989   Bruit    Duplex doppler 03/28/05 Mildly abnormal. *Right subclavian artery: Less than 50% diameter reduction.   Chest pain    2D Echo 04/21/08 EF = >55%. Moderate tricuspid regurgitation. Persantine Myovoew stress test 04/21/08 EF = 81%, no evidence of ischemia noted.   Chronic anticoagulation    PAF   Chronic edema    Mild LE edema   Chronic kidney disease    Mild, stage 2   Coronary artery disease    Coronary atherosclerosis    Minimal   Dyslipidemia    GERD (gastroesophageal reflux disease)    Hyperlipidemia    On  pravastatin   Memory difficulty 07/13/2014   Meningioma (HCC)    Resection in 1998   Mild depression (HCC)    Ovarian cyst    Benign. Had a salpiingoophorectomy in 2005.   PAF (paroxysmal atrial fibrillation) (HCC)    On coumadin. [employer] PPM (serial [id number]) inserted 2010   Paroxysmal atrial flutter ([place])    Seizure ([place]) 2006   Severe obesity (HCC)    Sick sinus syndrome ([place])    Sinus node dysfunction (HCC)    S/P implantation of a dual-chamber permanent pacemaker in 2010   SOB (shortness of breath) on exertion    Class II. Catheterization 04/30/10 showed mild CAD with mildly elevated right heart pressures.    Tachycardia-bradycardia syndrome ([place])    S/P implantation of dual-chamber permanent pacemaker in 2010   Trochlear nerve palsy    Right   Vertigo     Past Surgical History:  Procedure Laterality Date   Brain tumor removal  1989   CARDIAC CATHETERIZATION  04/30/10   Showed mild CAD with mildly elevated right heart pressures. (Dr. Elisabeth Cara)   [place]   (Brain tumor) With gamma knife procedure as well.   KNEE SURGERY     Bilateral, arthroscopic   PACEMAKER INSERTION  04/27/09   Implanted by Dr. Elisabeth Cara. [place] (serial [id number])   PPM GENERATOR CHANGEOUT N/A 09/12/2019   Procedure: PPM GENERATOR CHANGEOUT;  Surgeon: Sanda Klein, MD;  Location: [facility];  Service: Cardiovascular;  Laterality: N/A;    Current Outpatient Medications  Medication Sig Dispense Refill   acetaminophen (TYLENOL) 500 MG tablet Take 500 mg by mouth daily as needed for moderate pain or headache.     allopurinol (ZYLOPRIM) 100 MG tablet Take 100 mg by mouth daily.     Carboxymethylcellul-Glycerin (LUBRICATING EYE DROPS OP) Place 1 drop into both eyes daily as needed (dry eyes).     colchicine 0.6 MG tablet Take 1 tablet (0.6 mg total) by mouth daily as needed (gout flare). 20 tablet 0   ferrous sulfate 325 (65 FE) MG tablet TAKE 1 TABLET (325 MG TOTAL) BY MOUTH EVERY  MONDAY, WEDNESDAY, AND FRIDAY. 36 tablet 7   KLOR-CON M10 10 MEQ tablet TAKE 1 TABLET (10 MEQ TOTAL) BY MOUTH 2 (TWO) TIMES DAILY. TAKE AN EXTRA 20 MEQ WITH FUROSEMIDE. 240 tablet 3   metoprolol succinate (TOPROL-XL) 25 MG 24 hr tablet TAKE 1 TABLET BY MOUTH EVERY DAY 60 tablet 0   nitroGLYCERIN (NITROLINGUAL) 0.4 MG/SPRAY spray Place 3 sprays under the tongue every 5 (five) minutes x 3 doses as needed for chest pain. 12 g 3   omeprazole (PRILOSEC) 20 MG capsule Take 20 mg by mouth daily.     Polyethyl Glycol-Propyl Glycol (SYSTANE OP) Place 1 drop into both eyes at bedtime as needed (dry eyes).     pravastatin (PRAVACHOL) 20 MG tablet Take 20 mg by mouth at bedtime.     topiramate (TOPAMAX) 50 MG tablet Take 50 mg by mouth 2 (two) times daily.     torsemide (DEMADEX) 20 MG tablet TAKE 2 TABLETS BY MOUTH EVERY DAY 180 tablet 3   traMADol (ULTRAM) 50 MG tablet Take 50 mg by mouth at bedtime as needed for moderate pain (sleep).      verapamil (VERELAN PM) 240 MG 24 hr capsule TAKE 1 CAPSULE (240 MG TOTAL) BY MOUTH AT BEDTIME. 90 capsule 2   warfarin (COUMADIN) 4 MG tablet Take 4 mg by mouth See admin instructions. Take 4 mg at night on Mon, Tue, Wed, Fri, and Sat     warfarin (COUMADIN) 5 MG tablet Take 5 mg by mouth See admin instructions. Take 5 mg at night on Sun and Thur     No current facility-administered medications for this visit.    Allergies:   Dilantin [phenytoin sodium extended] and Phenobarbital   Social History:  The patient  reports that she quit smoking about 13 years ago. Her smoking use included cigarettes. She has never used smokeless tobacco. She reports that she does not drink alcohol and does not use drugs.   Family History:  The patient's family history includes Heart disease in her brother, brother, brother, father, sister, sister, and sister; Stroke in her mother.  ROS:  Please see the history of present illness.    All other systems are reviewed and otherwise negative.    PHYSICAL EXAM:  VS:  There were no vitals taken for this visit. BMI: There is no height or weight on file to calculate BMI. Well nourished, well developed, in no acute distress HEENT: normocephalic, atraumatic Neck: no JVD, carotid bruits or masses Cardiac:  *** RRR; no significant murmurs, no rubs, or gallops Lungs:  *** CTA b/l, no wheezing, rhonchi or rales Abd: soft, nontender MS: no deformity or *** atrophy Ext: *** no edema Skin: warm and dry, no rash Neuro:  No gross deficits appreciated Psych: euthymic  mood, full affect  *** PPM site is stable, no tethering or discomfort   EKG:  Done today and reviewed by myself shows  ***  Device interrogation done today and reviewed by myself:  ***  ECHO 11/25/2018  - Left ventricle: The cavity size was mildly reduced. There was   mild focal basal hypertrophy of the septum. Systolic function was   normal. The estimated ejection fraction was in the range of 50%   to 55%. Wall motion was normal; there were no regional wall   motion abnormalities. The study is indeterminate for the   evaluation of LV diastolic function. - Aortic valve: Trileaflet; mildly thickened, mildly calcified   leaflets. Sclerosis without stenosis. - Mitral valve: There was mild regurgitation. - Tricuspid valve: There was trivial regurgitation. - Pulmonary arteries: Systolic pressure was within the normal   range.   Impressions:   - EF 53% by 3D imaging, which agrees with visual and 2D estimates.   GLS mildly abnormal at -16.2%. No significant wall motion   abnormalities.  Recent Labs: No results found for requested labs within last 8760 hours.  No results found for requested labs within last 8760 hours.   CrCl cannot be calculated (Patient's most recent lab result is older than the maximum 21 days allowed.).   Wt Readings from Last 3 Encounters:  08/27/21 229 lb 3.2 oz (104 kg)  01/24/21 229 lb 6.4 oz (104.1 kg)  10/25/20 234 lb 9.6 oz (106.4 kg)      Other studies reviewed: Additional studies/records reviewed today include: summarized above  ASSESSMENT AND PLAN:  PPM ***  Paroxysmal AFib CHA2DS2Vasc is 5, on *** warfarin, monitored and managed with her PMD *** % burden *** Rates Chronic CHF (diastolic) ***  HTN ***  HLD ***   Disposition: F/u with ***  Current medicines are reviewed at length with the patient today.  The patient did not have any concerns regarding medicines.  Venetia Night, PA-C 11/19/2021 5:01 AM     [facility] [address] [place] [address] [phone] (office)  [phone] (fax)

## 2021-11-20 ENCOUNTER — Encounter: Payer: Self-pay | Admitting: Podiatry

## 2021-11-20 ENCOUNTER — Encounter: Payer: Medicare Other | Admitting: Physician Assistant

## 2021-11-20 ENCOUNTER — Ambulatory Visit: Payer: Medicare Other | Admitting: Podiatry

## 2021-11-20 DIAGNOSIS — M79674 Pain in right toe(s): Secondary | ICD-10-CM

## 2021-11-20 DIAGNOSIS — B351 Tinea unguium: Secondary | ICD-10-CM

## 2021-11-20 DIAGNOSIS — N183 Chronic kidney disease, stage 3 unspecified: Secondary | ICD-10-CM

## 2021-11-20 DIAGNOSIS — M79675 Pain in left toe(s): Secondary | ICD-10-CM | POA: Diagnosis not present

## 2021-11-20 DIAGNOSIS — D689 Coagulation defect, unspecified: Secondary | ICD-10-CM

## 2021-11-20 NOTE — Progress Notes (Signed)
This patient returns to my office for at risk foot care.  This patient requires this care by a professional since this patient will be at risk due to having coagulation defect and  CKD   This patient is unable to cut nails herself since the patient cannot reach her nails.These nails are painful walking and wearing shoes.  This patient presents for at risk foot care today. Patient presents to the office with her daughter.  General Appearance  Alert, conversant and in no acute stress.  Vascular  Dorsalis pedis and posterior tibial  pulses are not  palpable  Left foot.  Dorsalis pedis pulses are weakly palpable right foot due to swelling..  Capillary return is within normal limits  bilaterally. Temperature is within normal limits  bilaterally.  Neurologic  Senn-Weinstein monofilament wire test within normal limits  bilaterally. Muscle power within normal limits bilaterally.  Nails Thick disfigured discolored nails with subungual debris  from hallux to fifth toes bilaterally. No evidence of bacterial infection or drainage bilaterally.  Orthopedic  No limitations of motion  feet .  No crepitus or effusions noted.  No bony pathology or digital deformities noted.  Skin  normotropic skin with no porokeratosis noted bilaterally.  No signs of infections or ulcers noted.     Onychomycosis  Pain in right toes  Pain in left toes  Consent was obtained for treatment procedures.   Mechanical debridement of nails 1-5  bilaterally performed with a nail nipper.  Filed with dremel without incident.    Return office visit  3 months.                   Told patient to return for periodic foot care and evaluation due to potential at risk complications.   Gardiner Barefoot DPM

## 2021-11-24 NOTE — Progress Notes (Signed)
Cardiology Office Note    Date:  11/24/2021   ID:  [name], [name] Jan 20, 1932, MRN [id number]  PCP:  Willey Blade, MD  Cardiologist:   Sanda Klein, MD   Chief Complaint  Patient presents with   Edema    History of Present Illness:  [name] is a 87 y.o. female here for follow-up of her dual-chamber permanent pacemaker. The device was implanted in 2010 for tachycardia-bradycardia syndrome (paroxysmal atrial tachycardia, paroxysmal atrial fibrillation, symptomatic sinus bradycardia) and she underwent a generator change out in September 2020.  She presents with complaints of worsening edema and weight gain, that began even before Thanksgiving.  On our scale however her weight today is quite similar to baseline.  She reports that she had lost about 8 or 10 pounds and it came back quickly in the last 3 to 4 days..  She denies orthopnea or PND and has chronic NYHA functional class II exertional dyspnea.  She has not had chest pain.  She denies syncope.  She has not had any recent episodes of gout.    She does not like taking the torsemide twice a day so she is mostly taking it just once daily, 20 mg.  She has not had any palpitations, but interrogation of her pacemaker shows that she has had atrial fibrillation for about 16 hours, starting at 5 PM yesterday, ended just before her office appointment this morning.   She is currently in normal sinus rhythm.  Comprehensive interrogation of her pacemaker today shows usual findings for her.  Estimated generator longevity is 7-7.5 years.  Lead parameters are good.  She has 89% atrial pacing and only 6% ventricular pacing (this mostly occurs during episodes of atrial fibrillation).  The burden of atrial fibrillation is back down to about 8%, better than earlier in the year and rate control is good.  As mentioned, she had atrial fibrillation all of yesterday evening last night and was unaware of it.  Her echocardiogram performed  last October 2019 was interpreted as normal diastolic function.  On my review however the diastolic annulus velocities are actually decreased and the E/e' was 13-14, signifying probable volume overload.  She has preserved left ventricular systolic function.  She has never had embolic events that we are aware of. She has normal left ventricular systolic function by previous echo, normal nuclear stress testing, had minimal CAD on angiography 2011. She does have a history of seizures associated with resection of multiple meningiomas in the past.  No recurrence of seizures while on treatment with Topamax   Past Medical History:  Diagnosis Date   ACL (anterior cruciate ligament) rupture    Left   Acute MI ([place])    Arthritis    Back pain    Secondary to arthritis   Benign paroxysmal positional vertigo 03/18/2016   Brain tumor ([place])    On right side. Removed in 1989   Bruit    Duplex doppler 03/28/05 Mildly abnormal. *Right subclavian artery: Less than 50% diameter reduction.   Chest pain    2D Echo 04/21/08 EF = >55%. Moderate tricuspid regurgitation. Persantine Myovoew stress test 04/21/08 EF = 81%, no evidence of ischemia noted.   Chronic anticoagulation    PAF   Chronic edema    Mild LE edema   Chronic kidney disease    Mild, stage 2   Coronary artery disease    Coronary atherosclerosis    Minimal   Dyslipidemia    GERD (gastroesophageal reflux  disease)    Hyperlipidemia    On pravastatin   Memory difficulty 07/13/2014   Meningioma ([place])    Resection in 1998   Mild depression    Ovarian cyst    Benign. Had a salpiingoophorectomy in 2005.   PAF (paroxysmal atrial fibrillation) (HCC)    On coumadin. [employer] PPM (serial [id number]) inserted 2010   Paroxysmal atrial flutter ([place])    Seizure ([place]) 2006   Severe obesity (HCC)    Sick sinus syndrome ([place])    Sinus node dysfunction (HCC)    S/P implantation of a dual-chamber permanent pacemaker in 2010   SOB (shortness of breath) on  exertion    Class II. Catheterization 04/30/10 showed mild CAD with mildly elevated right heart pressures.    Tachycardia-bradycardia syndrome ([place])    S/P implantation of dual-chamber permanent pacemaker in 2010   Trochlear nerve palsy    Right   Vertigo     Past Surgical History:  Procedure Laterality Date   Brain tumor removal  1989   CARDIAC CATHETERIZATION  04/30/10   Showed mild CAD with mildly elevated right heart pressures. (Dr. Elisabeth Cara)   [place]   (Brain tumor) With gamma knife procedure as well.   KNEE SURGERY     Bilateral, arthroscopic   PACEMAKER INSERTION  04/27/09   Implanted by Dr. Elisabeth Cara. [place] (serial [id number])   PPM GENERATOR CHANGEOUT N/A 09/12/2019   Procedure: PPM GENERATOR CHANGEOUT;  Surgeon: Sanda Klein, MD;  Location: [facility];  Service: Cardiovascular;  Laterality: N/A;    Current Medications: Outpatient Medications Prior to Visit  Medication Sig Dispense Refill   acetaminophen (TYLENOL) 500 MG tablet Take 500 mg by mouth daily as needed for moderate pain or headache.     allopurinol (ZYLOPRIM) 100 MG tablet Take 100 mg by mouth daily.     Carboxymethylcellul-Glycerin (LUBRICATING EYE DROPS OP) Place 1 drop into both eyes daily as needed (dry eyes).     colchicine 0.6 MG tablet Take 1 tablet (0.6 mg total) by mouth daily as needed (gout flare). 20 tablet 0   ferrous sulfate 325 (65 FE) MG tablet TAKE 1 TABLET (325 MG TOTAL) BY MOUTH EVERY MONDAY, WEDNESDAY, AND FRIDAY. 36 tablet 7   metoprolol succinate (TOPROL-XL) 25 MG 24 hr tablet TAKE 1 TABLET BY MOUTH EVERY DAY 60 tablet 0   nitroGLYCERIN (NITROLINGUAL) 0.4 MG/SPRAY spray Place 3 sprays under the tongue every 5 (five) minutes x 3 doses as needed for chest pain. 12 g 3   omeprazole (PRILOSEC) 20 MG capsule Take 20 mg by mouth daily.     Polyethyl Glycol-Propyl Glycol (SYSTANE OP) Place 1 drop into both eyes at bedtime as needed (dry eyes).     pravastatin (PRAVACHOL)  20 MG tablet Take 20 mg by mouth at bedtime.     topiramate (TOPAMAX) 50 MG tablet Take 50 mg by mouth 2 (two) times daily.     traMADol (ULTRAM) 50 MG tablet Take 50 mg by mouth at bedtime as needed for moderate pain (sleep).      verapamil (VERELAN PM) 240 MG 24 hr capsule TAKE 1 CAPSULE (240 MG TOTAL) BY MOUTH AT BEDTIME. 90 capsule 2   warfarin (COUMADIN) 4 MG tablet Take 4 mg by mouth See admin instructions. Take 4 mg at night on Mon, Tue, Wed, Fri, and Sat     warfarin (COUMADIN) 5 MG tablet Take 5 mg by mouth See admin instructions. Take 5 mg  at night on Sun and Thur     KLOR-CON M10 10 MEQ tablet TAKE 1 TABLET (10 MEQ TOTAL) BY MOUTH 2 (TWO) TIMES DAILY. TAKE AN EXTRA 20 MEQ WITH FUROSEMIDE. 240 tablet 3   torsemide (DEMADEX) 20 MG tablet TAKE 2 TABLETS BY MOUTH EVERY DAY (Patient taking differently: Take 1 1/2 tablets daily.) 180 tablet 3   No facility-administered medications prior to visit.     Allergies:   Dilantin [phenytoin sodium extended] and Phenobarbital   Social History   Socioeconomic History   Marital status: Divorced    Spouse name: Not on file   Number of children: 3   Years of education: Not on file   Highest education level: Not on file  Occupational History    Comment: Retired  Tobacco Use   Smoking status: Former    Years: 20.00    Types: Cigarettes    Quit date: 12/23/2007    Years since quitting: 13.9   Smokeless tobacco: Never  Substance and Sexual Activity   Alcohol use: No    Alcohol/week: 0.0 standard drinks   Drug use: No   Sexual activity: Not on file  Other Topics Concern   Not on file  Social History Narrative   Lives with daughter   Right Handed   Drinks 1-2 cups caffeine daily   Social Determinants of Health   Financial Resource Strain: Not on file  Food Insecurity: Not on file  Transportation Needs: Not on file  Physical Activity: Not on file  Stress: Not on file  Social Connections: Not on file     Family History:  The  patient's family history includes Heart disease in her brother, brother, brother, father, sister, sister, and sister; Stroke in her mother.   ROS:   Please see the history of present illness.    ROS All other systems are reviewed and are negative.   PHYSICAL EXAM:   VS:  BP 120/62   Pulse 65   Ht 5\' 4"  (1.626 m)   Wt 229 lb 3.2 oz (104 kg)   SpO2 99%   BMI 39.34 kg/m     General: Alert, oriented x3, no distress, healthy pacemaker site Head: no evidence of trauma, PERRL, EOMI, no exophtalmos or lid lag, no myxedema, no xanthelasma; normal ears, nose and oropharynx Neck: normal jugular venous pulsations and no hepatojugular reflux; brisk carotid pulses without delay and no carotid bruits Chest: clear to auscultation, no signs of consolidation by percussion or palpation, normal fremitus, symmetrical and full respiratory excursions Cardiovascular: normal position and quality of the apical impulse, regular rhythm, normal first and second heart sounds, no murmurs, rubs or gallops Abdomen: no tenderness or distention, no masses by palpation, no abnormal pulsatility or arterial bruits, normal bowel sounds, no hepatosplenomegaly Extremities: no clubbing, cyanosis or edema; 2+ radial, ulnar and brachial pulses bilaterally; 2+ right femoral, posterior tibial and dorsalis pedis pulses; 2+ left femoral, posterior tibial and dorsalis pedis pulses; no subclavian or femoral bruits Neurological: grossly nonfocal Psych: Normal mood and affect     Wt Readings from Last 3 Encounters:  11/19/21 229 lb 3.2 oz (104 kg)  08/27/21 229 lb 3.2 oz (104 kg)  01/24/21 229 lb 6.4 oz (104.1 kg)      Studies/Labs Reviewed:   ECHO 11/25/2018  - Left ventricle: The cavity size was mildly reduced. There was   mild focal basal hypertrophy of the septum. Systolic function was   normal. The estimated ejection fraction was in the range  of 50%   to 55%. Wall motion was normal; there were no regional wall   motion  abnormalities. The study is indeterminate for the   evaluation of LV diastolic function. - Aortic valve: Trileaflet; mildly thickened, mildly calcified   leaflets. Sclerosis without stenosis. - Mitral valve: There was mild regurgitation. - Tricuspid valve: There was trivial regurgitation. - Pulmonary arteries: Systolic pressure was within the normal   range.   Impressions:   - EF 53% by 3D imaging, which agrees with visual and 2D estimates.   GLS mildly abnormal at -16.2%. No significant wall motion   abnormalities.    EKG: ECG is ordered today and shows sinus rhythm with nonspecific T wave changes.  QTc 397 ms.  Recent Labs: No results found for requested labs within last 8760 hours.  Hemoglobin14.7, creatinine 1.31, potassium 5.0 Lipid Panel  No results found for: CHOL, TRIG, HDL, CHOLHDL, VLDL, LDLCALC, LDLDIRECT, LABVLDL Followed by Dr. Karlton Lemon  ASSESSMENT:    1. Chronic diastolic heart failure (HCC)   2. Paroxysmal atrial fibrillation ([place])   3. Long term current use of anticoagulant therapy   4. SSS (sick sinus syndrome) ([place])   5. Pacemaker   6. Essential hypertension   7. Coronary artery disease involving native coronary artery of native heart without angina pectoris   8. Severe obesity (BMI 35.0-39.9) with comorbidity ([place])   9. Stage 3b chronic kidney disease ([place])   10. Pure hypercholesterolemia      PLAN:  In order of problems listed above:  1. Chronic diastolic heart failure: Had a lengthy discussion again regarding sodium dietary restriction, daily weight monitoring, signs and symptoms of heart failure.  She was only taking torsemide 20 mg once a day.  Increase back to continue torsemide 40 mg once daily.  She can take the full amount once daily, to shorten the amount of time she spends in the bathroom. 2. Paroxysmal atrial fibrillation: Well rate controlled, on anticoagulation.  She is never able to tell whether she is in sinus rhythm or atrial fibrillation.   The burden of atrial fibrillation has been lower in the last 6 months, compared to earlier in the year and is back to her historical average that has been around 5-8%.  CHADSVasc 5 (age 77, HTN, gender,  CHF. 3.  Anticoagulation with warfarin, no bleeding complications.  INR monitored by PCP. 4. Sinus node dysfunction: When she is not in atrial fibrillation she has virtually 100% atrial paced rhythm.  The heart rate histogram distribution is appropriate for her very sedentary lifestyle. 5. PPM: Normal device function.  Doing remote downloads every 3 months. 6. Essential hypertension: Very well controlled 7. CAD: No complaints of angina pectoris.  She had minor coronary atherosclerosis by previous cardiac catheterization 8. Obesity: Almost in morbidly obese range.  Discussed healthy diet. 9. Gout: Avoid taking colchicine daily due to the interaction with verapamil, but can take for 3-5 days during gout attacks. 10. CKD 3: Stable renal function, GFR around 40. 11. HLP: Labs are checked by PCP.  On pravastatin.    Medication Adjustments/Labs and Tests Ordered: Current medicines are reviewed at length with the patient today.  Concerns regarding medicines are outlined above.  Medication changes, Labs and Tests ordered today are listed in the Patient Instructions below. Patient Instructions  Medication Instructions:  TORSEMIDE: Take 40 mg once daily (2 of the 20 mg tablets) POTASSIUM: Take 20 mEq twice daily (2 of the 10 mEq tablets twice daily)  *If you need  a refill on your cardiac medications before your next appointment, please call your pharmacy*   Lab Work: Your provider would like for you to return in 2 weeks to have the following labs drawn: BMET and BNP. You do not need an appointment for the lab. Once in our office lobby there is a podium where you can sign in and ring the doorbell to alert [name] that you are here. The lab is open from 8:00 am to 4:30 pm; closed for lunch from  12:45pm-1:45pm.  If you have labs (blood work) drawn today and your tests are completely normal, you will receive your results only by: [place] (if you have MyChart) OR A paper copy in the mail If you have any lab test that is abnormal or we need to change your treatment, we will call you to review the results.   Testing/Procedures: None ordered   Follow-Up: At [facility], you and your health needs are our priority.  As part of our continuing mission to provide you with exceptional heart care, we have created designated Provider Care Teams.  These Care Teams include your primary Cardiologist (physician) and Advanced Practice Providers (APPs -  Physician Assistants and Nurse Practitioners) who all work together to provide you with the care you need, when you need it.  We recommend signing up for the patient portal called "MyChart".  Sign up information is provided on this After Visit Summary.  MyChart is used to connect with patients for Virtual Visits (Telemedicine).  Patients are able to view lab/test results, encounter notes, upcoming appointments, etc.  Non-urgent messages can be sent to your provider as well.   To learn more about what you can do with MyChart, go to [email or web address].    Your next appointment:   3 months with [facility] Standard 6 months with Dr. Sallyanne Kuster :1}    Other Instructions Your physician recommends that you weigh yourself everyday at the same time, on the same scale and with the same amount of clothing. Please keep a record of these weights and bring to your next appointment.    Signed, Sanda Klein, MD  11/24/2021 11:46 AM    [facility] [address], [place], [place]  [address] Phone: [phone]; Fax: [phone]: There is

## 2021-11-25 ENCOUNTER — Telehealth: Payer: Self-pay | Admitting: Cardiovascular Disease

## 2021-11-25 NOTE — Telephone Encounter (Signed)
pt is not able to get bloodwork done today at pcp due to her having gout.. she is not able to get another appt until 12/24/20. Please advise of next steps.

## 2021-11-25 NOTE — Telephone Encounter (Signed)
Returned call to patient-patient is due for labs next week, wondering if this can wait until 1/3 (her appt with PCP) or if she needs to come to our office to have this completed.    OV 11/29-torsemide and potassium increased, labs in 2 weeks  Advised to come to our office if able next week to have this completed.  Patient aware and verbalized understanding.  Aware of lab hours and location.

## 2021-12-04 LAB — BASIC METABOLIC PANEL
BUN/Creatinine Ratio: 17 (ref 12–28)
BUN: 28 mg/dL — ABNORMAL HIGH (ref 8–27)
CO2: 17 mmol/L — ABNORMAL LOW (ref 20–29)
Calcium: 9.7 mg/dL (ref 8.7–10.3)
Chloride: 108 mmol/L — ABNORMAL HIGH (ref 96–106)
Creatinine, Ser: 1.65 mg/dL — ABNORMAL HIGH (ref 0.57–1.00)
Glucose: 95 mg/dL (ref 70–99)
Potassium: 4.1 mmol/L (ref 3.5–5.2)
Sodium: 142 mmol/L (ref 134–144)
eGFR: 30 mL/min/{1.73_m2} — ABNORMAL LOW (ref >59)

## 2021-12-04 LAB — BRAIN NATRIURETIC PEPTIDE: BNP: 51.4 pg/mL (ref 0.0–100.0)

## 2021-12-06 ENCOUNTER — Ambulatory Visit (INDEPENDENT_AMBULATORY_CARE_PROVIDER_SITE_OTHER): Payer: Medicare Other

## 2021-12-06 DIAGNOSIS — I495 Sick sinus syndrome: Secondary | ICD-10-CM

## 2021-12-06 LAB — CUP PACEART REMOTE DEVICE CHECK
Battery Remaining Longevity: 89 mo
Battery Remaining Percentage: 81 %
Battery Voltage: 3.01 V
Brady Statistic AP VP Percent: 1.4 %
Brady Statistic AP VS Percent: 94 %
Brady Statistic AS VP Percent: 1 %
Brady Statistic AS VS Percent: 4.5 %
Brady Statistic RA Percent Paced: 88 %
Brady Statistic RV Percent Paced: 5.7 %
Date Time Interrogation Session: 20221216020016
Implantable Lead Implant Date: 20100507
Implantable Lead Implant Date: 20100507
Implantable Lead Location: 753859
Implantable Lead Location: 753860
Implantable Pulse Generator Implant Date: 20200921
Lead Channel Impedance Value: 390 Ohm
Lead Channel Impedance Value: 540 Ohm
Lead Channel Pacing Threshold Amplitude: 0.625 V
Lead Channel Pacing Threshold Amplitude: 1 V
Lead Channel Pacing Threshold Pulse Width: 0.5 ms
Lead Channel Pacing Threshold Pulse Width: 0.5 ms
Lead Channel Sensing Intrinsic Amplitude: 12 mV
Lead Channel Sensing Intrinsic Amplitude: 4.5 mV
Lead Channel Setting Pacing Amplitude: 1.625
Lead Channel Setting Pacing Amplitude: 2.5 V
Lead Channel Setting Pacing Pulse Width: 0.5 ms
Lead Channel Setting Sensing Sensitivity: 4 mV
Pulse Gen Model: 2272
Pulse Gen Serial Number: 9166074

## 2021-12-17 ENCOUNTER — Other Ambulatory Visit: Payer: Self-pay | Admitting: *Deleted

## 2021-12-17 MED ORDER — TORSEMIDE 20 MG PO TABS: 20.0000 mg | ORAL_TABLET | Freq: Every day | ORAL | 11 refills | Status: DC

## 2021-12-17 MED ORDER — POTASSIUM CHLORIDE CRYS ER 10 MEQ PO TBCR: 10.0000 meq | EXTENDED_RELEASE_TABLET | Freq: Every day | ORAL | 11 refills | Status: DC

## 2021-12-18 ENCOUNTER — Ambulatory Visit: Payer: Medicare Other | Admitting: Adult Health

## 2021-12-18 NOTE — Progress Notes (Signed)
Remote pacemaker transmission.   

## 2021-12-23 NOTE — Progress Notes (Signed)
PATIENT: [name] DOB: 1932/12/07  REASON FOR VISIT: follow up HISTORY FROM: patient PRIMARY NEUROLOGIST: Dr. [name]  HISTORY OF PRESENT ILLNESS: Today 12/23/21:  [name] is an 88 year old female with a history of meningioma and seizures.  She returns today for follow-up.  She reports that she has not had any seizure events.  Reports that her headaches are under good control.  She remains on Topamax 50 mg twice a day.  Reports that her memory is stable.  She states that her double vision is getting worse.  She states that she has an ophthalmologist that she follows with.  Her daughter is with her today.  Daughter states that ophthalmology has identified the problem with her vision however surgery is not an option for the patient due to her being on Coumadin.  I have not reviewed any notes from ophthalmology.  The patient's last CT scan of the head was in 2021 and stable.   08/27/21: [name] is an 88 year old female with a history of multiple meningioma and seizures.  She returns today for follow-up.  She is currently taking Topamax 50 mg twice a day.  At the last visit she was reporting some cognitive changes and Dr. [name] suggested changing her to long-acting Topamax or to Zonegran or Lamictal.  The patient did not want to change medication.  The patient's daughter currently lives with her.  She does not operate a motor vehicle.  The patient is mainly concerned that if she switches medication she could have a breakthrough seizure.  The daughter that is with her today does not notice her memory issues.  Patient reports headaches have remained stable  HISTORY (copied from Dr. Tobey Grim note)  [name] is an 88 year old right-handed black female with a history of multiple meningioma, she has a history of seizures associated with this.  She has done well with Topamax taking 50 mg twice daily, but she does report some difficulty with memory and concentration.  The patient has had  chronic issues with double vision, she has seen ophthalmology for prisms.  She does report some occasional headaches in the right frontal area that may come on once or twice a year, they stay for about 5 days then go away.  The patient has not had any recent seizures.  She has difficulty walking, she has had bilateral total hip replacements and she walks with a cane or a walker.  She has not had any recent falls.  She lives with her daughter.  She has low back pain is worse when she is stooped.  She does note occasional neck stiffness.  With the headaches, she denies any nausea or vomiting or photophobia or phonophobia.  She may take Ultram or Tylenol for the headache.  The patient does not operate a motor vehicle, she has a decrease in vision associated with macular degeneration.   REVIEW OF SYSTEMS: Out of a complete 14 system review of symptoms, the patient complains only of the following symptoms, and all other reviewed systems are negative.  ALLERGIES: Allergies  Allergen Reactions   Dilantin [Phenytoin Sodium Extended] Hives   Phenobarbital Hives          HOME MEDICATIONS: Outpatient Medications Prior to Visit  Medication Sig Dispense Refill   acetaminophen (TYLENOL) 500 MG tablet Take 500 mg by mouth daily as needed for moderate pain or headache.     allopurinol (ZYLOPRIM) 100 MG tablet Take 100 mg by mouth daily.     Carboxymethylcellul-Glycerin (LUBRICATING  EYE DROPS OP) Place 1 drop into both eyes daily as needed (dry eyes).     colchicine 0.6 MG tablet Take 1 tablet (0.6 mg total) by mouth daily as needed (gout flare). 20 tablet 0   ferrous sulfate 325 (65 FE) MG tablet TAKE 1 TABLET (325 MG TOTAL) BY MOUTH EVERY MONDAY, WEDNESDAY, AND FRIDAY. 36 tablet 7   metoprolol succinate (TOPROL-XL) 25 MG 24 hr tablet TAKE 1 TABLET BY MOUTH EVERY DAY 60 tablet 0   nitroGLYCERIN (NITROLINGUAL) 0.4 MG/SPRAY spray Place 3 sprays under the tongue every 5 (five) minutes x 3 doses as needed for  chest pain. 12 g 3   omeprazole (PRILOSEC) 20 MG capsule Take 20 mg by mouth daily.     Polyethyl Glycol-Propyl Glycol (SYSTANE OP) Place 1 drop into both eyes at bedtime as needed (dry eyes).     potassium chloride (KLOR-CON M10) 10 MEQ tablet Take 1 tablet (10 mEq total) by mouth daily. 30 tablet 11   pravastatin (PRAVACHOL) 20 MG tablet Take 20 mg by mouth at bedtime.     topiramate (TOPAMAX) 50 MG tablet Take 50 mg by mouth 2 (two) times daily.     torsemide (DEMADEX) 20 MG tablet Take 1 tablet (20 mg total) by mouth daily. 30 tablet 11   traMADol (ULTRAM) 50 MG tablet Take 50 mg by mouth at bedtime as needed for moderate pain (sleep).      verapamil (VERELAN PM) 240 MG 24 hr capsule TAKE 1 CAPSULE (240 MG TOTAL) BY MOUTH AT BEDTIME. 90 capsule 2   warfarin (COUMADIN) 4 MG tablet Take 4 mg by mouth See admin instructions. Take 4 mg at night on Mon, Tue, Wed, Fri, and Sat     warfarin (COUMADIN) 5 MG tablet Take 5 mg by mouth See admin instructions. Take 5 mg at night on Sun and Thur     No facility-administered medications prior to visit.    PAST MEDICAL HISTORY: Past Medical History:  Diagnosis Date   ACL (anterior cruciate ligament) rupture    Left   Acute MI ([place])    Arthritis    Back pain    Secondary to arthritis   Benign paroxysmal positional vertigo 03/18/2016   Brain tumor [facility])    On right side. Removed in 1989   Bruit    Duplex doppler 03/28/05 Mildly abnormal. *Right subclavian artery: Less than 50% diameter reduction.   Chest pain    2D Echo 04/21/08 EF = >55%. Moderate tricuspid regurgitation. Persantine Myovoew stress test 04/21/08 EF = 81%, no evidence of ischemia noted.   Chronic anticoagulation    PAF   Chronic edema    Mild LE edema   Chronic kidney disease    Mild, stage 2   Coronary artery disease    Coronary atherosclerosis    Minimal   Dyslipidemia    GERD (gastroesophageal reflux disease)    Hyperlipidemia    On pravastatin   Memory difficulty 07/13/2014    Meningioma (HCC)    Resection in 1998   Mild depression    Ovarian cyst    Benign. Had a salpiingoophorectomy in 2005.   PAF (paroxysmal atrial fibrillation) (HCC)    On coumadin. St. Jude PPM (serial [id number]) inserted 2010   Paroxysmal atrial flutter ([place])    Seizure ([place]) 2006   Severe obesity (HCC)    Sick sinus syndrome (HCC)    Sinus node dysfunction (HCC)    S/P implantation of a dual-chamber permanent pacemaker in  2010   SOB (shortness of breath) on exertion    Class II. Catheterization 04/30/10 showed mild CAD with mildly elevated right heart pressures.    Tachycardia-bradycardia syndrome ([place])    S/P implantation of dual-chamber permanent pacemaker in 2010   Trochlear nerve palsy    Right   Vertigo     PAST SURGICAL HISTORY: Past Surgical History:  Procedure Laterality Date   Brain tumor removal  1989   CARDIAC CATHETERIZATION  04/30/10   Showed mild CAD with mildly elevated right heart pressures. (Dr. Elisabeth Cara)   [place]   (Brain tumor) With gamma knife procedure as well.   KNEE SURGERY     Bilateral, arthroscopic   PACEMAKER INSERTION  04/27/09   Implanted by Dr. Elisabeth Cara. [place] (serial [id number])   PPM GENERATOR CHANGEOUT N/A 09/12/2019   Procedure: PPM GENERATOR CHANGEOUT;  Surgeon: Sanda Klein, MD;  Location: [place] [facility];  Service: Cardiovascular;  Laterality: N/A;    FAMILY HISTORY: Family History  Problem Relation Age of Onset   Stroke Mother    Heart disease Father    Heart disease Sister    Heart disease Brother    Heart disease Brother    Heart disease Brother    Heart disease Sister    Heart disease Sister     SOCIAL HISTORY: Social History   Socioeconomic History   Marital status: Divorced    Spouse name: Not on file   Number of children: 3   Years of education: Not on file   Highest education level: Not on file  Occupational History    Comment: Retired  Tobacco Use   Smoking status: Former    Years:  20.00    Types: Cigarettes    Quit date: 12/23/2007    Years since quitting: 14.0   Smokeless tobacco: Never  Substance and Sexual Activity   Alcohol use: No    Alcohol/week: 0.0 standard drinks   Drug use: No   Sexual activity: Not on file  Other Topics Concern   Not on file  Social History Narrative   Lives with daughter   Right Handed   Drinks 1-2 cups caffeine daily   Social Determinants of Health   Financial Resource Strain: Not on file  Food Insecurity: Not on file  Transportation Needs: Not on file  Physical Activity: Not on file  Stress: Not on file  Social Connections: Not on file  Intimate Partner Violence: Not on file      PHYSICAL EXAM  There were no vitals filed for this visit.  There is no height or weight on file to calculate BMI.  Generalized: Well developed, in no acute distress   Neurological examination  Mentation: Alert oriented to time, place, history taking. Follows all commands speech and language fluent Cranial nerve II-XII: Pupils were equal round reactive to light. Extraocular movements were full, visual field were full on confrontational test. Facial sensation and strength were normal. Uvula tongue midline. Head turning and shoulder shrug  were normal and symmetric. Motor: The motor testing reveals 5 over 5 strength of all 4 extremities. Good symmetric motor tone is noted throughout.  Sensory: Sensory testing is intact to soft touch on all 4 extremities. No evidence of extinction is noted.  Coordination: Cerebellar testing reveals good finger-nose-finger and heel-to-shin bilaterally.  Gait and station: Gait is normal.  Reflexes: Deep tendon reflexes are symmetric and normal bilaterally.   DIAGNOSTIC DATA (LABS, IMAGING, TESTING) - I reviewed patient records,  labs, notes, testing and imaging myself where available.  Lab Results  Component Value Date   WBC 6.8 03/30/2020   HGB 14.7 03/30/2020   HCT 50.6 (H) 03/30/2020   MCV 97.3 03/30/2020    PLT 178 03/30/2020      Component Value Date/Time   NA 142 12/03/2021 0817   K 4.1 12/03/2021 0817   CL 108 (H) 12/03/2021 0817   CO2 17 (L) 12/03/2021 0817   GLUCOSE 95 12/03/2021 0817   GLUCOSE 103 (H) 03/30/2020 2143   BUN 28 (H) 12/03/2021 0817   CREATININE 1.65 (H) 12/03/2021 0817   CREATININE 1.54 (H) 02/07/2016 1051   CALCIUM 9.7 12/03/2021 0817   PROT 6.7 03/30/2020 2143   ALBUMIN 3.7 03/30/2020 2143   AST 25 03/30/2020 2143   ALT 21 03/30/2020 2143   ALKPHOS 73 03/30/2020 2143   BILITOT 0.5 03/30/2020 2143   GFRNONAA 37 (L) 03/30/2020 2143   GFRAA 43 (L) 03/30/2020 2143        ASSESSMENT AND PLAN 88 y.o. year old female  has a past medical history of ACL (anterior cruciate ligament) rupture, [place]), Arthritis, Back pain, Benign paroxysmal positional vertigo (03/18/2016), Brain tumor ([place]), Bruit, Chest pain, Chronic anticoagulation, Chronic edema, Chronic kidney disease, Coronary artery disease, Coronary atherosclerosis, Dyslipidemia, GERD (gastroesophageal reflux disease), Hyperlipidemia, Memory difficulty (07/13/2014), Meningioma ([place]), Mild depression, Ovarian cyst, PAF (paroxysmal atrial fibrillation) ([place]), Paroxysmal atrial flutter ([place]), Seizure ([place]) (2006), Severe obesity ([place]), Sick sinus syndrome ([place]), Sinus node dysfunction ([place]), SOB (shortness of breath) on exertion, Tachycardia-bradycardia syndrome ([place]), Trochlear nerve palsy, and Vertigo. here with :  Seizures History of meningioma  -Continue Topamax 50 mg twice a day -Continue regular follow-ups with ophthalmology -Follow-up in 1 year or sooner if needed  Ward Givens, MSN, NP-C 12/23/2021, 1:24 PM [facility] [address], [place], [place] [address] [phone]

## 2021-12-24 ENCOUNTER — Other Ambulatory Visit: Payer: Self-pay

## 2021-12-24 ENCOUNTER — Encounter: Payer: Self-pay | Admitting: Adult Health

## 2021-12-24 ENCOUNTER — Ambulatory Visit: Payer: Medicare Other | Admitting: Adult Health

## 2021-12-24 VITALS — BP 150/76 | HR 64 | Ht 67.0 in | Wt 224.8 lb

## 2021-12-24 DIAGNOSIS — Z86011 Personal history of benign neoplasm of the brain: Secondary | ICD-10-CM | POA: Diagnosis not present

## 2021-12-24 DIAGNOSIS — R569 Unspecified convulsions: Secondary | ICD-10-CM

## 2021-12-24 NOTE — Patient Instructions (Signed)
Your Plan:  Continue topamax 50 mg twice a day If your symptoms worsen or you develop new symptoms please let us know.   Thank you for coming to see us at [facility]. I hope we have been able to provide you high quality care today.  You may receive a patient satisfaction survey over the next few weeks. We would appreciate your feedback and comments so that we may continue to improve ourselves and the health of our patients.  

## 2022-01-01 ENCOUNTER — Other Ambulatory Visit: Payer: Self-pay | Admitting: *Deleted

## 2022-01-01 MED ORDER — TORSEMIDE 20 MG PO TABS: 20.0000 mg | ORAL_TABLET | Freq: Every day | ORAL | 11 refills | Status: DC

## 2022-01-01 MED ORDER — POTASSIUM CHLORIDE CRYS ER 10 MEQ PO TBCR: 10.0000 meq | EXTENDED_RELEASE_TABLET | Freq: Every day | ORAL | 11 refills | Status: DC

## 2022-02-19 ENCOUNTER — Ambulatory Visit: Payer: Medicare Other | Admitting: Podiatry

## 2022-02-19 ENCOUNTER — Encounter: Payer: Self-pay | Admitting: Podiatry

## 2022-02-19 ENCOUNTER — Other Ambulatory Visit: Payer: Self-pay

## 2022-02-19 DIAGNOSIS — B351 Tinea unguium: Secondary | ICD-10-CM

## 2022-02-19 DIAGNOSIS — D689 Coagulation defect, unspecified: Secondary | ICD-10-CM

## 2022-02-19 DIAGNOSIS — M79675 Pain in left toe(s): Secondary | ICD-10-CM

## 2022-02-19 DIAGNOSIS — N183 Chronic kidney disease, stage 3 unspecified: Secondary | ICD-10-CM | POA: Diagnosis not present

## 2022-02-19 DIAGNOSIS — M79674 Pain in right toe(s): Secondary | ICD-10-CM

## 2022-02-19 NOTE — Progress Notes (Signed)
This patient returns to my office for at risk foot care.  This patient requires this care by a professional since this patient will be at risk due to having coagulation defect and  CKD   This patient is unable to cut nails herself since the patient cannot reach her nails.These nails are painful walking and wearing shoes.  This patient presents for at risk foot care today. Patient presents to the office with her daughter. ? ?General Appearance  Alert, conversant and in no acute stress. ? ?Vascular  Dorsalis pedis and posterior tibial  pulses are not  palpable  Left foot.  Dorsalis pedis pulses are weakly palpable right foot due to swelling..  Capillary return is within normal limits  bilaterally. Temperature is within normal limits  bilaterally. ? ?Neurologic  Senn-Weinstein monofilament wire test within normal limits  bilaterally. Muscle power within normal limits bilaterally. ? ?Nails Thick disfigured discolored nails with subungual debris  from hallux to fifth toes bilaterally. No evidence of bacterial infection or drainage bilaterally. ? ?Orthopedic  No limitations of motion  feet .  No crepitus or effusions noted.  No bony pathology or digital deformities noted. ? ?Skin  normotropic skin with no porokeratosis noted bilaterally.  No signs of infections or ulcers noted.    ? ?Onychomycosis  Pain in right toes  Pain in left toes ? ?Consent was obtained for treatment procedures.   Mechanical debridement of nails 1-5  bilaterally performed with a nail nipper.  Filed with dremel without incident.  ? ? ?Return office visit  10 weeks                    Told patient to return for periodic foot care and evaluation due to potential at risk complications. ? ? ?Gardiner Barefoot DPM  ?

## 2022-03-07 ENCOUNTER — Ambulatory Visit (INDEPENDENT_AMBULATORY_CARE_PROVIDER_SITE_OTHER): Payer: Medicare Other

## 2022-03-07 DIAGNOSIS — I495 Sick sinus syndrome: Secondary | ICD-10-CM

## 2022-03-07 LAB — CUP PACEART REMOTE DEVICE CHECK
Battery Remaining Longevity: 86 mo
Battery Remaining Percentage: 79 %
Battery Voltage: 2.99 V
Brady Statistic AP VP Percent: 1.4 %
Brady Statistic AP VS Percent: 95 %
Brady Statistic AS VP Percent: 1 %
Brady Statistic AS VS Percent: 2.8 %
Brady Statistic RA Percent Paced: 83 %
Brady Statistic RV Percent Paced: 12 %
Date Time Interrogation Session: 20230317020013
Implantable Lead Implant Date: 20100507
Implantable Lead Implant Date: 20100507
Implantable Lead Location: 753859
Implantable Lead Location: 753860
Implantable Pulse Generator Implant Date: 20200921
Lead Channel Impedance Value: 400 Ohm
Lead Channel Impedance Value: 530 Ohm
Lead Channel Pacing Threshold Amplitude: 0.625 V
Lead Channel Pacing Threshold Amplitude: 1 V
Lead Channel Pacing Threshold Pulse Width: 0.5 ms
Lead Channel Pacing Threshold Pulse Width: 0.5 ms
Lead Channel Sensing Intrinsic Amplitude: 12 mV
Lead Channel Sensing Intrinsic Amplitude: 3.6 mV
Lead Channel Setting Pacing Amplitude: 1.625
Lead Channel Setting Pacing Amplitude: 2.5 V
Lead Channel Setting Pacing Pulse Width: 0.5 ms
Lead Channel Setting Sensing Sensitivity: 4 mV
Pulse Gen Model: 2272
Pulse Gen Serial Number: 9166074

## 2022-03-09 NOTE — Progress Notes (Addendum)
? ?Cardiology Office Note ?Date:  03/09/2022  ?Patient ID:  Yolonda, Purtle [birth year], MRN [id number] ?PCP:  Willey Blade, MD  ?Cardiologist:  Dr. Sallyanne Kuster ? ? ?  ?Chief Complaint: planned f/u ? ?History of Present Illness: ?Yvanna Vidas Kriegel is a 88 y.o. female with history of ATach/Afib, s/p resection of meningiomas, seizure d/o, , vertigo, tachy/brady w/PPM, CKD (IIIb), chronic CHF (diastolic), obesity, HLD, HTN ? ?She come sin today to be seen for Dr. Sallyanne Kuster, last seen by him Nov 2022, she was only taking her torsemide daily rather then BID, and changed to all '40mg'$  once daily to avoid hopefully bathroom visits, AFib burden was OK about 5-8%, INR followed by her PMD, Atrial dependent when not in AF. ?Planned for f/u labs and 52movisit. ? ?TODAY ?She is accompanied by her daughter ?No new symptoms since her visit with Dr. CSallyanne Kuster?She again has self reduced her diuretic to 1 pill daily secondary to frequent urination. ?Has some DOE , unchanged from a year or more ago. ?NO rest SOB, denies nocturnal SOB ?No CP, palpitations or cardiac concerns ?No dizzy spells, near syncope or syncope. ? ?She has some swelling LE/ankles, an area particularly lateral aspect of low right leg ?She has some compression stocking s that are very hard to get on and uncomfortable ? ? ?Device information ?[employer] dual chamber PPM implanted 04/27/2009, gen change 09/12/2019 ? ? ?Past Medical History:  ?Diagnosis Date  ? ACL (anterior cruciate ligament) rupture   ? Left  ? [place]   ? Arthritis   ? Back pain   ? Secondary to arthritis  ? Benign paroxysmal positional vertigo 03/18/2016  ? [place]   ? On right side. Removed in 1989  ? Bruit   ? Duplex doppler 03/28/05 Mildly abnormal. *Right subclavian artery: Less than 50% diameter reduction.  ? Chest pain   ? 2D Echo 04/21/08 EF = >55%. Moderate tricuspid regurgitation. Persantine Myovoew stress test 04/21/08 EF = 81%, no evidence of ischemia noted.  ? Chronic  anticoagulation   ? PAF  ? Chronic edema   ? Mild LE edema  ? Chronic kidney disease   ? Mild, stage 2  ? Coronary artery disease   ? Coronary atherosclerosis   ? Minimal  ? Dyslipidemia   ? GERD (gastroesophageal reflux disease)   ? Hyperlipidemia   ? On pravastatin  ? Memory difficulty 07/13/2014  ? [place]   ? Resection in 1998  ? Mild depression   ? Ovarian cyst   ? Benign. Had a salpiingoophorectomy in 2005.  ? PA[place] (paroxysmal atrial fibrillation) ([place]   ? On coumadin. St. Jude PPM (serial #[id number] inserted 2010  ? Paroxysmal atrial flutter ([place]   ? [facility]izure ([facility] 2006  ? Severe obesity ([place]   ? Sick sinus syndrome ([place]   ? Sinus node dysfunction (HCC)   ? S/P implantation of a dual-chamber permanent pacemaker in 2010  ? SOB (shortness of breath) on exertion   ? Class II. Catheterization 04/30/10 showed mild CAD with mildly elevated right heart pressures.   ? Tachycardia-bradycardia syndrome ([place]   ? S/P implantation of dual-chamber permanent pacemaker in 2010  ? Trochlear nerve palsy   ? Right  ? Vertigo   ? ? ?Past Surgical History:  ?Procedure Laterality Date  ? Brain tumor removal  1989  ? CARDIAC CATHETERIZATION  04/30/10  ? Showed mild CAD with mildly elevated right heart pressures. (Dr. SElisabeth Cara  ? CRANIOTOMY  1989  ? (  Brain tumor) With gamma knife procedure as well.  ? KNEE SURGERY    ? Bilateral, arthroscopic  ? PACEMAKER INSERTION  04/27/09  ? Implanted by Dr. Elisabeth Cara. [place] DR (serial [id number])  ? PPM GENERATOR CHANGEOUT N/A 09/12/2019  ? Procedure: PPM GENERATOR CHANGEOUT;  Surgeon: Sanda Klein, MD;  Location: [place];  Service: Cardiovascular;  Laterality: N/A;  ? ? ?Current Outpatient Medications  ?Medication Sig Dispense Refill  ? acetaminophen (TYLENOL) 500 MG tablet Take 500 mg by mouth daily as needed for moderate pain or headache.    ? allopurinol (ZYLOPRIM) 100 MG tablet Take 100 mg by mouth daily.    ? Carboxymethylcellul-Glycerin (LUBRICATING EYE DROPS OP)  Place 1 drop into both eyes daily as needed (dry eyes).    ? colchicine 0.6 MG tablet Take 1 tablet (0.6 mg total) by mouth daily as needed (gout flare). 20 tablet 0  ? ferrous sulfate 325 (65 FE) MG tablet TAKE 1 TABLET (325 MG TOTAL) BY MOUTH EVERY MONDAY, WEDNESDAY, AND FRIDAY. 36 tablet 7  ? metoprolol succinate (TOPROL-XL) 25 MG 24 hr tablet TAKE 1 TABLET BY MOUTH EVERY DAY 60 tablet 0  ? nitroGLYCERIN (NITROLINGUAL) 0.4 MG/SPRAY spray Place 3 sprays under the tongue every 5 (five) minutes x 3 doses as needed for chest pain. 12 g 3  ? omeprazole (PRILOSEC) 20 MG capsule Take 20 mg by mouth daily.    ? Polyethyl Glycol-Propyl Glycol (SYSTANE OP) Place 1 drop into both eyes at bedtime as needed (dry eyes).    ? potassium chloride (KLOR-CON M10) 10 MEQ tablet Take 1 tablet (10 mEq total) by mouth daily. 30 tablet 11  ? pravastatin (PRAVACHOL) 20 MG tablet Take 20 mg by mouth at bedtime.    ? topiramate (TOPAMAX) 50 MG tablet Take 50 mg by mouth 2 (two) times daily.    ? torsemide (DEMADEX) 20 MG tablet Take 1 tablet (20 mg total) by mouth daily. 30 tablet 11  ? traMADol (ULTRAM) 50 MG tablet Take 50 mg by mouth at bedtime as needed for moderate pain (sleep).     ? verapamil (VERELAN PM) 240 MG 24 hr capsule TAKE 1 CAPSULE (240 MG TOTAL) BY MOUTH AT BEDTIME. 90 capsule 2  ? warfarin (COUMADIN) 4 MG tablet Take 4 mg by mouth See admin instructions. Take 4 mg at night on Mon, Tue, Wed, Fri, and Sat    ? warfarin (COUMADIN) 5 MG tablet Take 5 mg by mouth See admin instructions. Take 5 mg at night on Sun and Thur    ? ?No current facility-administered medications for this visit.  ? ? ?Allergies:   Dilantin [phenytoin sodium extended] and Phenobarbital  ? ?Social History:  The patient  reports that she quit smoking about 14 years ago. Her smoking use included cigarettes. She has never used smokeless tobacco. She reports that she does not drink alcohol and does not use drugs.  ? ?Family History:  The patient's family  history includes Heart disease in her brother, brother, brother, father, sister, sister, and sister; Stroke in her mother. ? ?ROS:  Please see the history of present illness.    ?All other systems are reviewed and otherwise negative.  ? ?PHYSICAL EXAM:  ?VS:  There were no vitals taken for this visit. BMI: There is no height or weight on file to calculate BMI. ?Well nourished, well developed, in no acute distress ?HEENT: normocephalic, atraumatic ?Neck: no JVD, carotid bruits or masses ?Cardiac:  RRR; no  significant murmurs, no rubs, or gallops ?Lungs:  CTA b/l, no wheezing, rhonchi or rales ?Abd: soft, nontender ?MS: no deformity or atrophy ?Ext: she has some trace pitting edema as well as a more brawny type edema, no skin changes ?Skin: warm and dry, no rash ?Neuro:  No gross deficits appreciated ?Psych: euthymic mood, full affect ? ?PPM site is stable, no tethering or discomfort ? ? ?EKG:  not done today ? ?Device interrogation done today and reviewed by myself:  ?Battery and lead measurements are good ?AF burden is up some to 13%, mostly Dec-Jan, has settled some in the last couple months ?No HVR episodes ?AP 83% ?VP 12% ? ? ?11/25/2018: TTE ?Study Conclusions  ?- Left ventricle: The cavity size was mildly reduced. There was  ?  mild focal basal hypertrophy of the septum. Systolic function was  ?  normal. The estimated ejection fraction was in the range of 50%  ?  to 55%. Wall motion was normal; there were no regional wall  ?  motion abnormalities. The study is indeterminate for the  ?  evaluation of LV diastolic function.  ?- Aortic valve: Trileaflet; mildly thickened, mildly calcified  ?  leaflets. Sclerosis without stenosis.  ?- Mitral valve: There was mild regurgitation.  ?- Tricuspid valve: There was trivial regurgitation.  ?- Pulmonary arteries: Systolic pressure was within the normal  ?  range.  ? ?Impressions:  ?- EF 53% by 3D imaging, which agrees with visual and 2D estimates.  ?  GLS mildly abnormal at  -16.2%. No significant wall motion  ?  abnormalities.  ? ? ?06/20/2014: lexiscan stress test ?Impression ?Exercise Capacity:  Lexiscan with no exercise. ?BP Response:  Normal blood pressure response. ?Clinical Symp

## 2022-03-11 ENCOUNTER — Encounter: Payer: Self-pay | Admitting: Physician Assistant

## 2022-03-11 ENCOUNTER — Ambulatory Visit: Payer: Medicare Other | Admitting: Physician Assistant

## 2022-03-11 ENCOUNTER — Other Ambulatory Visit: Payer: Self-pay

## 2022-03-11 VITALS — BP 134/66 | HR 66 | Ht 67.0 in | Wt 221.0 lb

## 2022-03-11 DIAGNOSIS — Z79899 Other long term (current) drug therapy: Secondary | ICD-10-CM | POA: Diagnosis not present

## 2022-03-11 DIAGNOSIS — I480 Paroxysmal atrial fibrillation: Secondary | ICD-10-CM

## 2022-03-11 DIAGNOSIS — Z950 Presence of cardiac pacemaker: Secondary | ICD-10-CM | POA: Diagnosis not present

## 2022-03-11 DIAGNOSIS — I5032 Chronic diastolic (congestive) heart failure: Secondary | ICD-10-CM

## 2022-03-11 DIAGNOSIS — I495 Sick sinus syndrome: Secondary | ICD-10-CM

## 2022-03-11 LAB — CUP PACEART INCLINIC DEVICE CHECK
Battery Remaining Longevity: 85 mo
Battery Voltage: 3.01 V
Brady Statistic RA Percent Paced: 83 %
Brady Statistic RV Percent Paced: 12 %
Date Time Interrogation Session: 20230321132102
Implantable Lead Implant Date: 20100507
Implantable Lead Implant Date: 20100507
Implantable Lead Location: 753859
Implantable Lead Location: 753860
Implantable Pulse Generator Implant Date: 20200921
Lead Channel Impedance Value: 362.5 Ohm
Lead Channel Impedance Value: 487.5 Ohm
Lead Channel Pacing Threshold Amplitude: 0.625 V
Lead Channel Pacing Threshold Amplitude: 1.25 V
Lead Channel Pacing Threshold Amplitude: 1.25 V
Lead Channel Pacing Threshold Pulse Width: 0.5 ms
Lead Channel Pacing Threshold Pulse Width: 0.5 ms
Lead Channel Pacing Threshold Pulse Width: 0.5 ms
Lead Channel Sensing Intrinsic Amplitude: 12 mV
Lead Channel Sensing Intrinsic Amplitude: 3.9 mV
Lead Channel Setting Pacing Amplitude: 1.625
Lead Channel Setting Pacing Amplitude: 2.5 V
Lead Channel Setting Pacing Pulse Width: 0.5 ms
Lead Channel Setting Sensing Sensitivity: 4 mV
Pulse Gen Model: 2272
Pulse Gen Serial Number: 9166074

## 2022-03-11 LAB — CBC
Hematocrit: 40.6 % (ref 34.0–46.6)
Hemoglobin: 12.5 g/dL (ref 11.1–15.9)
MCH: 25.8 pg — ABNORMAL LOW (ref 26.6–33.0)
MCHC: 30.8 g/dL — ABNORMAL LOW (ref 31.5–35.7)
MCV: 84 fL (ref 79–97)
Platelets: 193 10*3/uL (ref 150–450)
RBC: 4.84 x10E6/uL (ref 3.77–5.28)
RDW: 15.8 % — ABNORMAL HIGH (ref 11.7–15.4)
WBC: 4.5 10*3/uL (ref 3.4–10.8)

## 2022-03-11 LAB — BASIC METABOLIC PANEL
BUN/Creatinine Ratio: 13 (ref 12–28)
BUN: 19 mg/dL (ref 8–27)
CO2: 18 mmol/L — ABNORMAL LOW (ref 20–29)
Calcium: 10 mg/dL (ref 8.7–10.3)
Chloride: 108 mmol/L — ABNORMAL HIGH (ref 96–106)
Creatinine, Ser: 1.44 mg/dL — ABNORMAL HIGH (ref 0.57–1.00)
Glucose: 58 mg/dL — ABNORMAL LOW (ref 70–99)
Potassium: 4.3 mmol/L (ref 3.5–5.2)
Sodium: 142 mmol/L (ref 134–144)
eGFR: 35 mL/min/{1.73_m2} — ABNORMAL LOW (ref >59)

## 2022-03-11 NOTE — Patient Instructions (Addendum)
Medication Instructions:  ? ?Your physician recommends that you continue on your current medications as directed. Please refer to the Current Medication list given to you today. ? ? ?*If you need a refill on your cardiac medications before your next appointment, please call your pharmacy* ? ? ?Lab Work: BMET AND CBC TODAY  ? ?If you have labs (blood work) drawn today and your tests are completely normal, you will receive your results only by: ?MyChart Message (if you have MyChart) OR ?A paper copy in the mail ?If you have any lab test that is abnormal or we need to change your treatment, we will call you to review the results. ? ? ?Testing/Procedures: NONE ORDERED  TODAY ? ? ? ?Follow-Up: ?At [facility], you and your health needs are our priority.  As part of our continuing mission to provide you with exceptional heart care, we have created designated Provider Care Teams.  These Care Teams include your primary Cardiologist (physician) and Advanced Practice Providers (APPs -  Physician Assistants and Nurse Practitioners) who all work together to provide you with the care you need, when you need it. ? ?We recommend signing up for the patient portal called "MyChart".  Sign up information is provided on this After Visit Summary.  MyChart is used to connect with patients for Virtual Visits (Telemedicine).  Patients are able to view lab/test results, encounter notes, upcoming appointments, etc.  Non-urgent messages can be sent to your provider as well.   ?To learn more about what you can do with MyChart, go to [email or web address].   ? ?Your next appointment:   ?6 month(s) ? ?The format for your next appointment:   ?In Person ? ?Provider:   ?Sanda Klein, MD { ? ?  ? ? ?Other Instructions ? ?

## 2022-03-14 NOTE — Progress Notes (Signed)
Remote pacemaker transmission.   

## 2022-03-17 ENCOUNTER — Telehealth: Payer: Self-pay | Admitting: *Deleted

## 2022-03-17 NOTE — Telephone Encounter (Signed)
-----   Message from [facility], [place] sent at 03/12/2022  9:03 AM EDT ----- ?Labs are stable ?

## 2022-03-18 ENCOUNTER — Other Ambulatory Visit: Payer: Self-pay | Admitting: Cardiovascular Disease

## 2022-04-08 ENCOUNTER — Other Ambulatory Visit: Payer: Self-pay | Admitting: Cardiovascular Disease

## 2022-04-23 ENCOUNTER — Other Ambulatory Visit: Payer: Self-pay | Admitting: Cardiovascular Disease

## 2022-04-24 NOTE — Telephone Encounter (Signed)
The original prescription was discontinued on 03/11/2022 by Claude Manges, CMA ?

## 2022-04-30 ENCOUNTER — Ambulatory Visit: Payer: Medicare Other | Admitting: Podiatry

## 2022-04-30 ENCOUNTER — Encounter: Payer: Self-pay | Admitting: Podiatry

## 2022-04-30 DIAGNOSIS — D689 Coagulation defect, unspecified: Secondary | ICD-10-CM

## 2022-04-30 DIAGNOSIS — M79675 Pain in left toe(s): Secondary | ICD-10-CM

## 2022-04-30 DIAGNOSIS — B351 Tinea unguium: Secondary | ICD-10-CM

## 2022-04-30 DIAGNOSIS — M79674 Pain in right toe(s): Secondary | ICD-10-CM

## 2022-04-30 DIAGNOSIS — N183 Chronic kidney disease, stage 3 unspecified: Secondary | ICD-10-CM

## 2022-04-30 NOTE — Progress Notes (Signed)
This patient returns to my office for at risk foot care.  This patient requires this care by a professional since this patient will be at risk due to having coagulation defect and  CKD   This patient is unable to cut nails herself since the patient cannot reach her nails.These nails are painful walking and wearing shoes.  This patient presents for at risk foot care today. Patient presents to the office with her daughter. ? ?General Appearance  Alert, conversant and in no acute stress. ? ?Vascular  Dorsalis pedis and posterior tibial  pulses are not  palpable  Left foot.  Dorsalis pedis pulses are weakly palpable right foot due to swelling..  Capillary return is within normal limits  bilaterally. Temperature is within normal limits  bilaterally. ? ?Neurologic  Senn-Weinstein monofilament wire test within normal limits  bilaterally. Muscle power within normal limits bilaterally. ? ?Nails Thick disfigured discolored nails with subungual debris  from hallux to fifth toes bilaterally. No evidence of bacterial infection or drainage bilaterally. ? ?Orthopedic  No limitations of motion  feet .  No crepitus or effusions noted.  No bony pathology or digital deformities noted. ? ?Skin  normotropic skin with no porokeratosis noted bilaterally.  No signs of infections or ulcers noted.    ? ?Onychomycosis  Pain in right toes  Pain in left toes ? ?Consent was obtained for treatment procedures.   Mechanical debridement of nails 1-5  bilaterally performed with a nail nipper.  Filed with dremel without incident.  ? ? ?Return office visit  10 weeks                    Told patient to return for periodic foot care and evaluation due to potential at risk complications. ? ? ?Gardiner Barefoot DPM  ?

## 2022-05-16 ENCOUNTER — Other Ambulatory Visit: Payer: Self-pay | Admitting: Cardiovascular Disease

## 2022-06-06 ENCOUNTER — Ambulatory Visit (INDEPENDENT_AMBULATORY_CARE_PROVIDER_SITE_OTHER): Payer: Medicare Other

## 2022-06-06 DIAGNOSIS — I495 Sick sinus syndrome: Secondary | ICD-10-CM

## 2022-06-07 LAB — CUP PACEART REMOTE DEVICE CHECK
Battery Remaining Longevity: 83 mo
Battery Remaining Percentage: 76 %
Battery Voltage: 3.01 V
Brady Statistic AP VP Percent: 1 %
Brady Statistic AP VS Percent: 97 %
Brady Statistic AS VP Percent: 1 %
Brady Statistic AS VS Percent: 2 %
Brady Statistic RA Percent Paced: 88 %
Brady Statistic RV Percent Paced: 7.6 %
Date Time Interrogation Session: 20230616020013
Implantable Lead Implant Date: 20100507
Implantable Lead Implant Date: 20100507
Implantable Lead Location: 753859
Implantable Lead Location: 753860
Implantable Pulse Generator Implant Date: 20200921
Lead Channel Impedance Value: 360 Ohm
Lead Channel Impedance Value: 480 Ohm
Lead Channel Pacing Threshold Amplitude: 0.5 V
Lead Channel Pacing Threshold Amplitude: 1.25 V
Lead Channel Pacing Threshold Pulse Width: 0.5 ms
Lead Channel Pacing Threshold Pulse Width: 0.5 ms
Lead Channel Sensing Intrinsic Amplitude: 12 mV
Lead Channel Sensing Intrinsic Amplitude: 3.6 mV
Lead Channel Setting Pacing Amplitude: 1.5 V
Lead Channel Setting Pacing Amplitude: 2.5 V
Lead Channel Setting Pacing Pulse Width: 0.5 ms
Lead Channel Setting Sensing Sensitivity: 4 mV
Pulse Gen Model: 2272
Pulse Gen Serial Number: 9166074

## 2022-06-12 NOTE — Progress Notes (Signed)
Remote pacemaker transmission.   

## 2022-07-11 ENCOUNTER — Ambulatory Visit: Payer: Medicare Other | Admitting: Podiatry

## 2022-07-11 ENCOUNTER — Encounter: Payer: Self-pay | Admitting: Podiatry

## 2022-07-11 DIAGNOSIS — D689 Coagulation defect, unspecified: Secondary | ICD-10-CM | POA: Diagnosis not present

## 2022-07-11 DIAGNOSIS — N183 Chronic kidney disease, stage 3 unspecified: Secondary | ICD-10-CM | POA: Diagnosis not present

## 2022-07-11 DIAGNOSIS — M79675 Pain in left toe(s): Secondary | ICD-10-CM | POA: Diagnosis not present

## 2022-07-11 DIAGNOSIS — B351 Tinea unguium: Secondary | ICD-10-CM

## 2022-07-11 DIAGNOSIS — M79674 Pain in right toe(s): Secondary | ICD-10-CM

## 2022-07-11 NOTE — Progress Notes (Signed)
This patient returns to my office for at risk foot care.  This patient requires this care by a professional since this patient will be at risk due to having coagulation defect and  CKD   This patient is unable to cut nails herself since the patient cannot reach her nails.These nails are painful walking and wearing shoes.  This patient presents for at risk foot care today. Patient presents to the office with her daughter.  General Appearance  Alert, conversant and in no acute stress.  Vascular  Dorsalis pedis and posterior tibial  pulses are not  palpable  Left foot.  Dorsalis pedis pulses are weakly palpable right foot due to swelling..  Capillary return is within normal limits  bilaterally. Temperature is within normal limits  bilaterally.  Neurologic  Senn-Weinstein monofilament wire test within normal limits  bilaterally. Muscle power within normal limits bilaterally.  Nails Thick disfigured discolored nails with subungual debris  from hallux to fifth toes bilaterally. No evidence of bacterial infection or drainage bilaterally.  Orthopedic  No limitations of motion  feet .  No crepitus or effusions noted.  No bony pathology .  Hammer toes 2,3 left foot.  Skin  normotropic skin with no porokeratosis noted bilaterally.  No signs of infections or ulcers noted.     Onychomycosis  Pain in right toes  Pain in left toes  Consent was obtained for treatment procedures.   Mechanical debridement of nails 1-5  bilaterally performed with a nail nipper.  Filed with dremel without incident.    Return office visit  10 weeks                    Told patient to return for periodic foot care and evaluation due to potential at risk complications.   [name] Dack DPM  

## 2022-09-01 ENCOUNTER — Ambulatory Visit: Payer: Medicare Other | Attending: Cardiovascular Disease | Admitting: Cardiovascular Disease

## 2022-09-01 ENCOUNTER — Encounter: Payer: Self-pay | Admitting: Cardiovascular Disease

## 2022-09-01 VITALS — BP 102/62 | HR 60 | Ht 67.0 in | Wt 219.0 lb

## 2022-09-01 DIAGNOSIS — I5032 Chronic diastolic (congestive) heart failure: Secondary | ICD-10-CM | POA: Diagnosis not present

## 2022-09-01 DIAGNOSIS — I495 Sick sinus syndrome: Secondary | ICD-10-CM

## 2022-09-01 DIAGNOSIS — Z950 Presence of cardiac pacemaker: Secondary | ICD-10-CM

## 2022-09-01 DIAGNOSIS — I480 Paroxysmal atrial fibrillation: Secondary | ICD-10-CM | POA: Diagnosis not present

## 2022-09-01 DIAGNOSIS — D6869 Other thrombophilia: Secondary | ICD-10-CM | POA: Diagnosis not present

## 2022-09-01 DIAGNOSIS — E7800 Pure hypercholesterolemia, unspecified: Secondary | ICD-10-CM

## 2022-09-01 DIAGNOSIS — Z8739 Personal history of other diseases of the musculoskeletal system and connective tissue: Secondary | ICD-10-CM

## 2022-09-01 DIAGNOSIS — N1832 Chronic kidney disease, stage 3b: Secondary | ICD-10-CM

## 2022-09-01 DIAGNOSIS — I2510 Atherosclerotic heart disease of native coronary artery without angina pectoris: Secondary | ICD-10-CM

## 2022-09-01 DIAGNOSIS — I10 Essential (primary) hypertension: Secondary | ICD-10-CM

## 2022-09-01 NOTE — Progress Notes (Signed)
Cardiology Office Note    Date:  09/01/2022   ID:  [name], [name] [birth year], MRN [id number]  PCP:  Willey Blade, MD  Cardiologist:   Sanda Klein, MD   Chief Complaint  Patient presents with   Atrial Fibrillation   Pacemaker Check    History of Present Illness:  [name] is a 88 y.o. female here for follow-up of her dual-chamber permanent pacemaker. The device was implanted in 2010 for tachycardia-bradycardia syndrome (paroxysmal atrial tachycardia, paroxysmal atrial fibrillation, symptomatic sinus bradycardia) and she underwent a generator change out in September 2020.  She has had some off-and-on issues with lower extremity edema, but she has not required additional doses of torsemide in about a month's time.  Her weight is unchanged from her last appointment.  She has good days and bad days, but there is no clear evidence that these correlate with arrhythmia.  Her blood pressure is borderline low, but she has not had syncope or near syncope.  She denies orthopnea, PND, chest pain at rest or with activity and has not had any focal neurological events.  She has not had any falls or bleeding problems.  No recent gout attacks.  Pacemaker interrogation shows normal device function.  Estimated generator longevity is over 6 years.  Active testing of ventricular capture threshold was performed today.  She has 86% atrial pacing and only 9% ventricular pacing.  The burden of atrial fibrillation has been 11% in the last 3 months, comparable to her baseline of around 9%.  She had 15 hours of atrial fibrillation yesterday, but as always is not truly aware of the arrhythmia.  Rate control is good. Back in normal rhythm as of 6 AM this morning, presents with atrial paced, ventricular sensed rhythm at about 60 bpm.  Taking torsemide 20 mg once daily, has not had to take a double dose in over a month.  Blood pressure today was 102/62, but she was not particularly symptomatic.   Does have occasional orthostatic dizziness.  Her echocardiogram performed last October 2019 was interpreted as normal diastolic function.  On my review however the diastolic annulus velocities are actually decreased and the E/e' was 13-14, signifying probable volume overload.  She has preserved left ventricular systolic function.  She has never had embolic events that we are aware of. She has normal left ventricular systolic function by previous echo, normal nuclear stress testing, had minimal CAD on angiography 2011. She does have a history of seizures associated with resection of multiple meningiomas in the past.  No recurrence of seizures while on treatment with Topamax   Past Medical History:  Diagnosis Date   ACL (anterior cruciate ligament) rupture    Left   Acute MI ([place])    Arthritis    Back pain    Secondary to arthritis   Benign paroxysmal positional vertigo 03/18/2016   Brain tumor ([place])    On right side. Removed in 1989   Bruit    Duplex doppler 03/28/05 Mildly abnormal. *Right subclavian artery: Less than 50% diameter reduction.   Chest pain    2D Echo 04/21/08 EF = >55%. Moderate tricuspid regurgitation. Persantine Myovoew stress test 04/21/08 EF = 81%, no evidence of ischemia noted.   Chronic anticoagulation    PAF   Chronic edema    Mild LE edema   Chronic kidney disease    Mild, stage 2   Coronary artery disease    Coronary atherosclerosis    Minimal   Dyslipidemia  GERD (gastroesophageal reflux disease)    Hyperlipidemia    On pravastatin   Memory difficulty 07/13/2014   Meningioma ([place])    Resection in 1998   Mild depression    Ovarian cyst    Benign. Had a salpiingoophorectomy in 2005.   PAF (paroxysmal atrial fibrillation) (HCC)    On coumadin. [employer] PPM (serial [phone]) inserted 2010   Paroxysmal atrial flutter ([place])    Seizure ([place]) 2006   Severe obesity (HCC)    Sick sinus syndrome ([place])    Sinus node dysfunction (HCC)    S/P implantation of a  dual-chamber permanent pacemaker in 2010   SOB (shortness of breath) on exertion    Class II. Catheterization 04/30/10 showed mild CAD with mildly elevated right heart pressures.    Tachycardia-bradycardia syndrome ([place])    S/P implantation of dual-chamber permanent pacemaker in 2010   Trochlear nerve palsy    Right   Vertigo     Past Surgical History:  Procedure Laterality Date   Brain tumor removal  1989   CARDIAC CATHETERIZATION  04/30/10   Showed mild CAD with mildly elevated right heart pressures. (Dr. Elisabeth Cara)   [place]   (Brain tumor) With gamma knife procedure as well.   KNEE SURGERY     Bilateral, arthroscopic   PACEMAKER INSERTION  04/27/09   Implanted by Dr. Elisabeth Cara. [place] (serial [id number])   PPM GENERATOR CHANGEOUT N/A 09/12/2019   Procedure: PPM GENERATOR CHANGEOUT;  Surgeon: Sanda Klein, MD;  Location: [facility];  Service: Cardiovascular;  Laterality: N/A;    Current Medications: Outpatient Medications Prior to Visit  Medication Sig Dispense Refill   allopurinol (ZYLOPRIM) 100 MG tablet Take 100 mg by mouth daily.     omeprazole (PRILOSEC) 20 MG capsule Take 20 mg by mouth daily.     Polyethyl Glycol-Propyl Glycol (SYSTANE OP) Place 1 drop into both eyes at bedtime as needed (dry eyes).     potassium chloride (KLOR-CON M10) 10 MEQ tablet Take 1 tablet (10 mEq total) by mouth daily. 30 tablet 11   pravastatin (PRAVACHOL) 20 MG tablet Take 20 mg by mouth at bedtime.     topiramate (TOPAMAX) 50 MG tablet Take 50 mg by mouth 2 (two) times daily.     torsemide (DEMADEX) 20 MG tablet Take 1 tablet (20 mg total) by mouth daily. 30 tablet 11   verapamil (VERELAN PM) 240 MG 24 hr capsule TAKE 1 CAPSULE (240 MG TOTAL) BY MOUTH AT BEDTIME. 90 capsule 3   warfarin (COUMADIN) 4 MG tablet Take 4 mg by mouth See admin instructions. Take 4 mg at night on Mon, Tue, Wed, Fri, and Sat     warfarin (COUMADIN) 5 MG tablet Take 5 mg by mouth See admin  instructions. Take 5 mg at night on Sun and Thur     metoprolol succinate (TOPROL-XL) 25 MG 24 hr tablet TAKE 1 TABLET BY MOUTH EVERY DAY 60 tablet 2   acetaminophen (TYLENOL) 500 MG tablet Take 500 mg by mouth daily as needed for moderate pain or headache. (Patient not taking: Reported on 09/01/2022)     Carboxymethylcellul-Glycerin (LUBRICATING EYE DROPS OP) Place 1 drop into both eyes daily as needed (dry eyes). (Patient not taking: Reported on 09/01/2022)     colchicine 0.6 MG tablet Take 1 tablet (0.6 mg total) by mouth daily as needed (gout flare). (Patient not taking: Reported on 09/01/2022) 20 tablet 0   nitroGLYCERIN (NITROLINGUAL) 0.4 MG/SPRAY spray  Place 3 sprays under the tongue every 5 (five) minutes x 3 doses as needed for chest pain. (Patient not taking: Reported on 09/01/2022) 12 g 3   traMADol (ULTRAM) 50 MG tablet Take 50 mg by mouth at bedtime as needed for moderate pain (sleep).  (Patient not taking: Reported on 09/01/2022)     No facility-administered medications prior to visit.     Allergies:   Dilantin [phenytoin sodium extended] and Phenobarbital   Social History   Socioeconomic History   Marital status: Divorced    Spouse name: Not on file   Number of children: 3   Years of education: Not on file   Highest education level: Not on file  Occupational History    Comment: Retired  Tobacco Use   Smoking status: Former    Years: 20.00    Types: Cigarettes    Quit date: 12/23/2007    Years since quitting: 14.7   Smokeless tobacco: Never  Substance and Sexual Activity   Alcohol use: No    Alcohol/week: 0.0 standard drinks of alcohol   Drug use: No   Sexual activity: Not on file  Other Topics Concern   Not on file  Social History Narrative   Lives with daughter   Right Handed   Drinks 1-2 cups caffeine daily   Social Determinants of Health   Financial Resource Strain: Not on file  Food Insecurity: Not on file  Transportation Needs: Not on file  Physical  Activity: Not on file  Stress: Not on file  Social Connections: Not on file     Family History:  The patient's family history includes Heart disease in her brother, brother, brother, father, sister, sister, and sister; Stroke in her mother.   ROS:   Please see the history of present illness.    Review of Systems  Respiratory:  Positive for shortness of breath.    All other systems are reviewed and are negative.   PHYSICAL EXAM:   VS:  BP 102/62 (BP Location: Left Arm, Patient Position: Sitting, Cuff Size: Large)   Pulse 60   Ht '5\' 7"'$  (1.702 m)   Wt 219 lb (99.3 kg)   SpO2 97%   BMI 34.30 kg/m      General: Alert, oriented x3, no distress, moderately obese.  Healthy with subclavian pacemaker site Head: no evidence of trauma, PERRL, EOMI, no exophtalmos or lid lag, no myxedema, no xanthelasma; normal ears, nose and oropharynx Neck: normal jugular venous pulsations and no hepatojugular reflux; brisk carotid pulses without delay and no carotid bruits Chest: clear to auscultation, no signs of consolidation by percussion or palpation, normal fremitus, symmetrical and full respiratory excursions Cardiovascular: normal position and quality of the apical impulse, regular rhythm, normal first and second heart sounds, no murmurs, rubs or gallops Abdomen: no tenderness or distention, no masses by palpation, no abnormal pulsatility or arterial bruits, normal bowel sounds, no hepatosplenomegaly Extremities: no clubbing, cyanosis or edema; 2+ radial, ulnar and brachial pulses bilaterally; 2+ right femoral, posterior tibial and dorsalis pedis pulses; 2+ left femoral, posterior tibial and dorsalis pedis pulses; no subclavian or femoral bruits Neurological: grossly nonfocal Psych: Normal mood and affect      Wt Readings from Last 3 Encounters:  09/01/22 219 lb (99.3 kg)  03/11/22 221 lb (100.2 kg)  12/24/21 224 lb 12.8 oz (102 kg)      Studies/Labs Reviewed:   ECHO 11/25/2018  - Left  ventricle: The cavity size was mildly reduced. There was  mild focal basal hypertrophy of the septum. Systolic function was   normal. The estimated ejection fraction was in the range of 50%   to 55%. Wall motion was normal; there were no regional wall   motion abnormalities. The study is indeterminate for the   evaluation of LV diastolic function. - Aortic valve: Trileaflet; mildly thickened, mildly calcified   leaflets. Sclerosis without stenosis. - Mitral valve: There was mild regurgitation. - Tricuspid valve: There was trivial regurgitation. - Pulmonary arteries: Systolic pressure was within the normal   range.   Impressions:   - EF 53% by 3D imaging, which agrees with visual and 2D estimates.   GLS mildly abnormal at -16.2%. No significant wall motion   abnormalities.    EKG: ECG is ordered today and atrial paced, ventricular sensed rhythm, very minor nonspecific ST segment sagging, normal QTc 406 ms  Recent Labs: 12/03/2021: BNP 51.4 03/11/2022: BUN 19; Creatinine, Ser 1.44; Hemoglobin 12.5; Platelets 193; Potassium 4.3; Sodium 142  Hemoglobin14.7, creatinine 1.31, potassium 5.0 Lipid Panel  No results found for: "CHOL", "TRIG", "HDL", "CHOLHDL", "VLDL", "LDLCALC", "LDLDIRECT", "LABVLDL" Followed by Dr. Robert Bellow not available for review at this time.  ASSESSMENT:    1. Chronic diastolic congestive heart failure (HCC)   2. Paroxysmal atrial fibrillation ([place])   3. Acquired thrombophilia ([place])   4. SSS (sick sinus syndrome) ([place])   5. Pacemaker   6. Essential hypertension   7. Coronary artery disease involving native coronary artery of native heart without angina pectoris   8. Severe obesity (BMI 35.0-39.9) with comorbidity ([place])   9. History of gout   10. Stage 3b chronic kidney disease ([place])   11. Pure hypercholesterolemia       PLAN:  In order of problems listed above:  1. Chronic diastolic heart failure: Doing better with this.  Hopefully will continue to  show discretion with sodium intake and therefore will stay on a very low-dose of loop diuretic.  NYHA functional class II.  Not exercising regularly as recommended. 2. Paroxysmal atrial fibrillation: Stable burden this year at around 9-11%, a little higher than her historical average of 5-8%.  CHADSVasc 5 (age 89, HTN, gender,  CHF).  Rate control is good.  I think we will stop her low-dose of metoprolol. 3.  Anticoagulation: No bleeding complications, INR monitored by PCP.  INR was 2.4 on 08/19/2022. 4. Sinus node dysfunction: Appropriate heart rate histogram distribution for her activity level. 5. PPM: Normal device function, continue remote downloads every 3 months. 6. Essential hypertension: Good control, possibly excessive.  I decided to stop her metoprolol. 7. CAD: Denies angina pectoris.  Nonobstructive CAD by previous cardiac catheterization. 8. Obesity: Stable weight. 9. Gout: No recent gout attacks.  Avoid taking colchicine daily due to the interaction with verapamil, but can take for 3-5 days during gout attacks. 10. CKD 3: Stable renal function with creatinine baseline around 1.4-1.5, GFR around 35-40 11. HLP: On pravastatin, with labs monitored by PCP    Medication Adjustments/Labs and Tests Ordered: Current medicines are reviewed at length with the patient today.  Concerns regarding medicines are outlined above.  Medication changes, Labs and Tests ordered today are listed in the Patient Instructions below. Patient Instructions  Medication Instructions:  STOP the Metoprolol  *If you need a refill on your cardiac medications before your next appointment, please call your pharmacy*   Lab Work: None ordered If you have labs (blood work) drawn today and your tests are completely normal, you will receive  your results only by: [place] (if you have MyChart) OR A paper copy in the mail If you have any lab test that is abnormal or we need to change your treatment, we will call  you to review the results.   Testing/Procedures: None ordered   Follow-Up: At [facility], you and your health needs are our priority.  As part of our continuing mission to provide you with exceptional heart care, we have created designated Provider Care Teams.  These Care Teams include your primary Cardiologist (physician) and Advanced Practice Providers (APPs -  Physician Assistants and Nurse Practitioners) who all work together to provide you with the care you need, when you need it.  We recommend signing up for the patient portal called "MyChart".  Sign up information is provided on this After Visit Summary.  MyChart is used to connect with patients for Virtual Visits (Telemedicine).  Patients are able to view lab/test results, encounter notes, upcoming appointments, etc.  Non-urgent messages can be sent to your provider as well.   To learn more about what you can do with MyChart, go to [email or web address].    Your next appointment:   6 month(s) on a device day  The format for your next appointment:   In Person  Provider:   Sanda Klein, MD      Important Information About Sugar          Signed, Sanda Klein, MD  09/01/2022 3:33 PM    [address], [place], [place]  [address] Phone: [phone]; Fax: [phone]: There is

## 2022-09-01 NOTE — Patient Instructions (Signed)
Medication Instructions:  STOP the Metoprolol  *If you need a refill on your cardiac medications before your next appointment, please call your pharmacy*   Lab Work: None ordered If you have labs (blood work) drawn today and your tests are completely normal, you will receive your results only by: [place] (if you have MyChart) OR A paper copy in the mail If you have any lab test that is abnormal or we need to change your treatment, we will call you to review the results.   Testing/Procedures: None ordered   Follow-Up: At [facility], you and your health needs are our priority.  As part of our continuing mission to provide you with exceptional heart care, we have created designated Provider Care Teams.  These Care Teams include your primary Cardiologist (physician) and Advanced Practice Providers (APPs -  Physician Assistants and Nurse Practitioners) who all work together to provide you with the care you need, when you need it.  We recommend signing up for the patient portal called "MyChart".  Sign up information is provided on this After Visit Summary.  MyChart is used to connect with patients for Virtual Visits (Telemedicine).  Patients are able to view lab/test results, encounter notes, upcoming appointments, etc.  Non-urgent messages can be sent to your provider as well.   To learn more about what you can do with MyChart, go to [email or web address].    Your next appointment:   6 month(s) on a device day  The format for your next appointment:   In Person  Provider:   Sanda Klein, MD      Important Information About Sugar

## 2022-09-05 ENCOUNTER — Ambulatory Visit (INDEPENDENT_AMBULATORY_CARE_PROVIDER_SITE_OTHER): Payer: Medicare Other

## 2022-09-05 DIAGNOSIS — I495 Sick sinus syndrome: Secondary | ICD-10-CM | POA: Diagnosis not present

## 2022-09-06 LAB — CUP PACEART REMOTE DEVICE CHECK
Battery Remaining Longevity: 80 mo
Battery Remaining Percentage: 74 %
Battery Voltage: 2.99 V
Brady Statistic AP VP Percent: 1 %
Brady Statistic AP VS Percent: 92 %
Brady Statistic AS VP Percent: 1 %
Brady Statistic AS VS Percent: 7.2 %
Brady Statistic RA Percent Paced: 69 %
Brady Statistic RV Percent Paced: 4.4 %
Date Time Interrogation Session: 20230915020036
Implantable Lead Implant Date: 20100507
Implantable Lead Implant Date: 20100507
Implantable Lead Location: 753859
Implantable Lead Location: 753860
Implantable Pulse Generator Implant Date: 20200921
Lead Channel Impedance Value: 350 Ohm
Lead Channel Impedance Value: 490 Ohm
Lead Channel Pacing Threshold Amplitude: 0.5 V
Lead Channel Pacing Threshold Amplitude: 0.75 V
Lead Channel Pacing Threshold Pulse Width: 0.5 ms
Lead Channel Pacing Threshold Pulse Width: 0.5 ms
Lead Channel Sensing Intrinsic Amplitude: 12 mV
Lead Channel Sensing Intrinsic Amplitude: 4.9 mV
Lead Channel Setting Pacing Amplitude: 1.5 V
Lead Channel Setting Pacing Amplitude: 2.5 V
Lead Channel Setting Pacing Pulse Width: 0.5 ms
Lead Channel Setting Sensing Sensitivity: 4 mV
Pulse Gen Model: 2272
Pulse Gen Serial Number: 9166074

## 2022-09-16 NOTE — Progress Notes (Signed)
Remote pacemaker transmission.   

## 2022-09-19 ENCOUNTER — Ambulatory Visit: Payer: Medicare Other | Admitting: Podiatry

## 2022-09-26 ENCOUNTER — Encounter: Payer: Self-pay | Admitting: Podiatry

## 2022-09-26 ENCOUNTER — Ambulatory Visit: Payer: Medicare Other | Admitting: Podiatry

## 2022-09-26 DIAGNOSIS — B351 Tinea unguium: Secondary | ICD-10-CM | POA: Diagnosis not present

## 2022-09-26 DIAGNOSIS — M79674 Pain in right toe(s): Secondary | ICD-10-CM

## 2022-09-26 DIAGNOSIS — D689 Coagulation defect, unspecified: Secondary | ICD-10-CM

## 2022-09-26 DIAGNOSIS — N183 Chronic kidney disease, stage 3 unspecified: Secondary | ICD-10-CM | POA: Diagnosis not present

## 2022-09-26 DIAGNOSIS — M79675 Pain in left toe(s): Secondary | ICD-10-CM

## 2022-09-26 NOTE — Progress Notes (Signed)
This patient returns to my office for at risk foot care.  This patient requires this care by a professional since this patient will be at risk due to having coagulation defect and  CKD   This patient is unable to cut nails herself since the patient cannot reach her nails.These nails are painful walking and wearing shoes.  This patient presents for at risk foot care today. Patient presents to the office with her daughter.  General Appearance  Alert, conversant and in no acute stress.  Vascular  Dorsalis pedis and posterior tibial  pulses are not  palpable  Left foot.  Dorsalis pedis pulses are weakly palpable right foot due to swelling..  Capillary return is within normal limits  bilaterally. Temperature is within normal limits  bilaterally.  Neurologic  Senn-Weinstein monofilament wire test within normal limits  bilaterally. Muscle power within normal limits bilaterally.  Nails Thick disfigured discolored nails with subungual debris  from hallux to fifth toes bilaterally. No evidence of bacterial infection or drainage bilaterally.  Orthopedic  No limitations of motion  feet .  No crepitus or effusions noted.  No bony pathology .  Hammer toes 2,3 left foot.  Skin  normotropic skin with no porokeratosis noted bilaterally.  No signs of infections or ulcers noted.     Onychomycosis  Pain in right toes  Pain in left toes  Consent was obtained for treatment procedures.   Mechanical debridement of nails 1-5  bilaterally performed with a nail nipper.  Filed with dremel without incident.    Return office visit  10 weeks                    Told patient to return for periodic foot care and evaluation due to potential at risk complications.   Ahsan Esterline DPM  

## 2022-10-16 ENCOUNTER — Other Ambulatory Visit: Payer: Self-pay | Admitting: Cardiovascular Disease

## 2022-12-02 ENCOUNTER — Ambulatory Visit: Payer: Medicare Other | Admitting: Podiatry

## 2022-12-02 ENCOUNTER — Encounter: Payer: Self-pay | Admitting: Podiatry

## 2022-12-02 DIAGNOSIS — D689 Coagulation defect, unspecified: Secondary | ICD-10-CM | POA: Diagnosis not present

## 2022-12-02 DIAGNOSIS — N183 Chronic kidney disease, stage 3 unspecified: Secondary | ICD-10-CM

## 2022-12-02 DIAGNOSIS — B351 Tinea unguium: Secondary | ICD-10-CM | POA: Diagnosis not present

## 2022-12-02 DIAGNOSIS — M79674 Pain in right toe(s): Secondary | ICD-10-CM

## 2022-12-02 DIAGNOSIS — M79675 Pain in left toe(s): Secondary | ICD-10-CM | POA: Diagnosis not present

## 2022-12-02 NOTE — Progress Notes (Signed)
This patient returns to my office for at risk foot care.  This patient requires this care by a professional since this patient will be at risk due to having coagulation defect and  CKD   This patient is unable to cut nails herself since the patient cannot reach her nails.These nails are painful walking and wearing shoes.  This patient presents for at risk foot care today. Patient presents to the office with her daughter.  General Appearance  Alert, conversant and in no acute stress.  Vascular  Dorsalis pedis and posterior tibial  pulses are not  palpable  Left foot.  Dorsalis pedis pulses are weakly palpable right foot due to swelling..  Capillary return is within normal limits  bilaterally. Temperature is within normal limits  bilaterally.  Neurologic  Senn-Weinstein monofilament wire test within normal limits  bilaterally. Muscle power within normal limits bilaterally.  Nails Thick disfigured discolored nails with subungual debris  from hallux to fifth toes bilaterally. No evidence of bacterial infection or drainage bilaterally.  Orthopedic  No limitations of motion  feet .  No crepitus or effusions noted.  No bony pathology .  Hammer toes 2,3 left foot.  Skin  normotropic skin with no porokeratosis noted bilaterally.  No signs of infections or ulcers noted.     Onychomycosis  Pain in right toes  Pain in left toes  Consent was obtained for treatment procedures.   Mechanical debridement of nails 1-5  bilaterally performed with a nail nipper.  Filed with dremel without incident.    Return office visit  10 weeks                    Told patient to return for periodic foot care and evaluation due to potential at risk complications.   Gardiner Barefoot DPM

## 2022-12-05 ENCOUNTER — Ambulatory Visit (INDEPENDENT_AMBULATORY_CARE_PROVIDER_SITE_OTHER): Payer: Medicare Other

## 2022-12-05 DIAGNOSIS — I495 Sick sinus syndrome: Secondary | ICD-10-CM | POA: Diagnosis not present

## 2022-12-05 LAB — CUP PACEART REMOTE DEVICE CHECK
Battery Remaining Longevity: 77 mo
Battery Remaining Percentage: 71 %
Battery Voltage: 2.99 V
Brady Statistic AP VP Percent: 1 %
Brady Statistic AP VS Percent: 90 %
Brady Statistic AS VP Percent: 1 %
Brady Statistic AS VS Percent: 9.4 %
Brady Statistic RA Percent Paced: 64 %
Brady Statistic RV Percent Paced: 10 %
Date Time Interrogation Session: 20231215020023
Implantable Lead Connection Status: 753985
Implantable Lead Connection Status: 753985
Implantable Lead Implant Date: 20100507
Implantable Lead Implant Date: 20100507
Implantable Lead Location: 753859
Implantable Lead Location: 753860
Implantable Pulse Generator Implant Date: 20200921
Lead Channel Impedance Value: 340 Ohm
Lead Channel Impedance Value: 530 Ohm
Lead Channel Pacing Threshold Amplitude: 0.625 V
Lead Channel Pacing Threshold Amplitude: 0.75 V
Lead Channel Pacing Threshold Pulse Width: 0.5 ms
Lead Channel Pacing Threshold Pulse Width: 0.5 ms
Lead Channel Sensing Intrinsic Amplitude: 12 mV
Lead Channel Sensing Intrinsic Amplitude: 4.3 mV
Lead Channel Setting Pacing Amplitude: 1.625
Lead Channel Setting Pacing Amplitude: 2.5 V
Lead Channel Setting Pacing Pulse Width: 0.5 ms
Lead Channel Setting Sensing Sensitivity: 4 mV
Pulse Gen Model: 2272
Pulse Gen Serial Number: 9166074

## 2022-12-25 ENCOUNTER — Ambulatory Visit: Payer: Medicare Other | Admitting: Adult Health

## 2022-12-25 ENCOUNTER — Encounter: Payer: Self-pay | Admitting: Adult Health

## 2022-12-25 VITALS — BP 144/66 | HR 53 | Ht 67.0 in | Wt 219.8 lb

## 2022-12-25 DIAGNOSIS — Z86011 Personal history of benign neoplasm of the brain: Secondary | ICD-10-CM

## 2022-12-25 DIAGNOSIS — R569 Unspecified convulsions: Secondary | ICD-10-CM | POA: Diagnosis not present

## 2022-12-25 MED ORDER — TOPIRAMATE 50 MG PO TABS
50.0000 mg | ORAL_TABLET | Freq: Two times a day (BID) | ORAL | 5 refills | Status: DC
Start: 2022-12-25 — End: 2023-08-25

## 2022-12-25 NOTE — Progress Notes (Signed)
PATIENT: [name] DOB: [birth year]  REASON FOR VISIT: follow up HISTORY FROM: patient PRIMARY NEUROLOGIST: Dr. [name]  Chief Complaint  Patient presents with   Follow-up    Rm 4, alone. Seizures.      HISTORY OF PRESENT ILLNESS: Today 12/25/22:  [name] is a 89 y.o. female with a history of meningioma and seizures. Returns today for follow-up.  Patient reports that she has not had any seizure events.  Remains on Topamax 50 mg twice a day. Daughter lives with her. Able to complete most ADLS independently. Daughter helps with her bath. She manages her own medications.  Patient reports that she still has trouble with her vision in the right eye.  She is seeing her ophthalmology but unless she has surgery she reports that there is nothing else they can do.     12/24/21: [name] is an 89 year old female with a history of meningioma and seizures.  She returns today for follow-up.  She reports that she has not had any seizure events.  Reports that her headaches are under good control.  She remains on Topamax 50 mg twice a day.  Reports that her memory is stable.  She states that her double vision is getting worse.  She states that she has an ophthalmologist that she follows with.  Her daughter is with her today.  Daughter states that ophthalmology has identified the problem with her vision however surgery is not an option for the patient due to her being on Coumadin.  I have not reviewed any notes from ophthalmology.  The patient's last CT scan of the head was in 2021 and stable.   08/27/21: [name] is an 89 year old female with a history of multiple meningioma and seizures.  She returns today for follow-up.  She is currently taking Topamax 50 mg twice a day.  At the last visit she was reporting some cognitive changes and Dr. [name] suggested changing her to long-acting Topamax or to Zonegran or Lamictal.  The patient did not want to change medication.  The patient's  daughter currently lives with her.  She does not operate a motor vehicle.  The patient is mainly concerned that if she switches medication she could have a breakthrough seizure.  The daughter that is with her today does not notice her memory issues.  Patient reports headaches have remained stable  HISTORY (copied from Dr. Tobey Grim note)  [name] is an 89 year old right-handed black female with a history of multiple meningioma, she has a history of seizures associated with this.  She has done well with Topamax taking 50 mg twice daily, but she does report some difficulty with memory and concentration.  The patient has had chronic issues with double vision, she has seen ophthalmology for prisms.  She does report some occasional headaches in the right frontal area that may come on once or twice a year, they stay for about 5 days then go away.  The patient has not had any recent seizures.  She has difficulty walking, she has had bilateral total hip replacements and she walks with a cane or a walker.  She has not had any recent falls.  She lives with her daughter.  She has low back pain is worse when she is stooped.  She does note occasional neck stiffness.  With the headaches, she denies any nausea or vomiting or photophobia or phonophobia.  She may take Ultram or Tylenol for the headache.  The patient does not operate  a motor vehicle, she has a decrease in vision associated with macular degeneration.   REVIEW OF SYSTEMS: Out of a complete 14 system review of symptoms, the patient complains only of the following symptoms, and all other reviewed systems are negative.  ALLERGIES: Allergies  Allergen Reactions   Dilantin [Phenytoin Sodium Extended] Hives   Phenobarbital Hives          HOME MEDICATIONS: Outpatient Medications Prior to Visit  Medication Sig Dispense Refill   allopurinol (ZYLOPRIM) 100 MG tablet Take 100 mg by mouth daily.     Carboxymethylcellul-Glycerin (LUBRICATING EYE DROPS OP)  Place 1 drop into both eyes daily as needed (dry eyes).     colchicine 0.6 MG tablet Take 1 tablet (0.6 mg total) by mouth daily as needed (gout flare). 20 tablet 0   nitroGLYCERIN (NITROLINGUAL) 0.4 MG/SPRAY spray Place 3 sprays under the tongue every 5 (five) minutes x 3 doses as needed for chest pain. 12 g 3   omeprazole (PRILOSEC) 20 MG capsule Take 20 mg by mouth daily.     Polyethyl Glycol-Propyl Glycol (SYSTANE OP) Place 1 drop into both eyes at bedtime as needed (dry eyes).     potassium chloride (KLOR-CON M10) 10 MEQ tablet Take 1 tablet (10 mEq total) by mouth daily. 30 tablet 11   pravastatin (PRAVACHOL) 20 MG tablet Take 20 mg by mouth at bedtime.     topiramate (TOPAMAX) 50 MG tablet Take 50 mg by mouth 2 (two) times daily.     torsemide (DEMADEX) 20 MG tablet TAKE 1 TABLET BY MOUTH EVERY DAY 90 tablet 3   traMADol (ULTRAM) 50 MG tablet Take 50 mg by mouth at bedtime as needed for moderate pain (sleep).     verapamil (VERELAN PM) 240 MG 24 hr capsule TAKE 1 CAPSULE (240 MG TOTAL) BY MOUTH AT BEDTIME. 90 capsule 3   warfarin (COUMADIN) 4 MG tablet Take 4 mg by mouth See admin instructions. Take 4 mg at night on Mon, Tue, Wed, Fri, and Sat     warfarin (COUMADIN) 5 MG tablet Take 5 mg by mouth See admin instructions. Take 5 mg at night on Sun and Thur     No facility-administered medications prior to visit.    PAST MEDICAL HISTORY: Past Medical History:  Diagnosis Date   ACL (anterior cruciate ligament) rupture    Left   Acute MI ([name])    Arthritis    Back pain    Secondary to arthritis   Benign paroxysmal positional vertigo 03/18/2016   Brain tumor [facility])    On right side. Removed in 1989   Bruit    Duplex doppler 03/28/05 Mildly abnormal. *Right subclavian artery: Less than 50% diameter reduction.   Chest pain    2D Echo 04/21/08 EF = >55%. Moderate tricuspid regurgitation. Persantine Myovoew stress test 04/21/08 EF = 81%, no evidence of ischemia noted.   Chronic anticoagulation     PAF   Chronic edema    Mild LE edema   Chronic kidney disease    Mild, stage 2   Coronary artery disease    Coronary atherosclerosis    Minimal   Dyslipidemia    GERD (gastroesophageal reflux disease)    Hyperlipidemia    On pravastatin   Memory difficulty 07/13/2014   Meningioma ([facility])    Resection in 1998   Mild depression    Ovarian cyst    Benign. Had a salpiingoophorectomy in 2005.   PAF (paroxysmal atrial fibrillation) ([place])  On coumadin. [employer] Jude PPM (serial [id number]) inserted 2010   Paroxysmal atrial flutter ([place])    Seizure ([place]) 2006   Severe obesity ([facility])    Sick sinus syndrome ([place])    Sinus node dysfunction ([facility])    S/P implantation of a dual-chamber permanent pacemaker in 2010   SOB (shortness of breath) on exertion    Class II. Catheterization 04/30/10 showed mild CAD with mildly elevated right heart pressures.    Tachycardia-bradycardia syndrome ([place])    S/P implantation of dual-chamber permanent pacemaker in 2010   Trochlear nerve palsy    Right   Vertigo     PAST SURGICAL HISTORY: Past Surgical History:  Procedure Laterality Date   Brain tumor removal  1989   CARDIAC CATHETERIZATION  04/30/10   Showed mild CAD with mildly elevated right heart pressures. (Dr. Elisabeth Cara)   [place]   (Brain tumor) With gamma knife procedure as well.   KNEE SURGERY     Bilateral, arthroscopic   PACEMAKER INSERTION  04/27/09   Implanted by Dr. Elisabeth Cara. [place] (serial [id number])   PPM GENERATOR CHANGEOUT N/A 09/12/2019   Procedure: PPM GENERATOR CHANGEOUT;  Surgeon: Sanda Klein, MD;  Location: [facility];  Service: Cardiovascular;  Laterality: N/A;    FAMILY HISTORY: Family History  Problem Relation Age of Onset   Stroke Mother    Heart disease Father    Heart disease Sister    Heart disease Sister    Heart disease Sister    Heart disease Brother    Heart disease Brother    Heart disease Brother    Seizures Neg Hx     SOCIAL  HISTORY: Social History   Socioeconomic History   Marital status: Divorced    Spouse name: Not on file   Number of children: 3   Years of education: Not on file   Highest education level: Not on file  Occupational History    Comment: Retired  Tobacco Use   Smoking status: Former    Years: 20.00    Types: Cigarettes    Quit date: 12/23/2007    Years since quitting: 15.0   Smokeless tobacco: Never  Substance and Sexual Activity   Alcohol use: No    Alcohol/week: 0.0 standard drinks of alcohol   Drug use: No   Sexual activity: Not on file  Other Topics Concern   Not on file  Social History Narrative   Lives with daughter   Right Handed   Drinks 1-2 cups caffeine daily   Social Determinants of Health   Financial Resource Strain: Not on file  Food Insecurity: Not on file  Transportation Needs: Not on file  Physical Activity: Not on file  Stress: Not on file  Social Connections: Not on file  Intimate Partner Violence: Not on file      PHYSICAL EXAM  Vitals:   12/25/22 1303  Weight: 219 lb 12.8 oz (99.7 kg)    Body mass index is 34.43 kg/m.  Generalized: Well developed, in no acute distress   Neurological examination  Mentation: Alert oriented to time, place, history taking. Follows all commands speech and language fluent Cranial nerve II-XII: Pupils were equal round reactive to light. Extraocular movements were full, visual field were full on confrontational test. Facial sensation and strength were normal. . Head turning and shoulder shrug  were normal and symmetric. Motor: The motor testing reveals 5 over 5 strength of all 4 extremities. Good symmetric motor tone is noted  throughout.  Sensory: Sensory testing is intact to soft touch on all 4 extremities. No evidence of extinction is noted.  Coordination: Cerebellar testing reveals good finger-nose-finger and heel-to-shin bilaterally.  Gait and station: Gait is normal.  Reflexes: Deep tendon reflexes are  symmetric and normal bilaterally.   DIAGNOSTIC DATA (LABS, IMAGING, TESTING) - I reviewed patient records, labs, notes, testing and imaging myself where available.  Lab Results  Component Value Date   WBC 4.5 03/11/2022   HGB 12.5 03/11/2022   HCT 40.6 03/11/2022   MCV 84 03/11/2022   PLT 193 03/11/2022      Component Value Date/Time   NA 142 03/11/2022 0911   K 4.3 03/11/2022 0911   CL 108 (H) 03/11/2022 0911   CO2 18 (L) 03/11/2022 0911   GLUCOSE 58 (L) 03/11/2022 0911   GLUCOSE 103 (H) 03/30/2020 2143   BUN 19 03/11/2022 0911   CREATININE 1.44 (H) 03/11/2022 0911   CREATININE 1.54 (H) 02/07/2016 1051   CALCIUM 10.0 03/11/2022 0911   PROT 6.7 03/30/2020 2143   ALBUMIN 3.7 03/30/2020 2143   AST 25 03/30/2020 2143   ALT 21 03/30/2020 2143   ALKPHOS 73 03/30/2020 2143   BILITOT 0.5 03/30/2020 2143   GFRNONAA 37 (L) 03/30/2020 2143   GFRAA 43 (L) 03/30/2020 2143        ASSESSMENT AND PLAN 89 y.o. year old female  has a past medical history of ACL (anterior cruciate ligament) rupture, Acute MI ([place]), Arthritis, Back pain, Benign paroxysmal positional vertigo (03/18/2016), Brain tumor ([place]), Bruit, Chest pain, Chronic anticoagulation, Chronic edema, Chronic kidney disease, Coronary artery disease, Coronary atherosclerosis, Dyslipidemia, GERD (gastroesophageal reflux disease), Hyperlipidemia, Memory difficulty (07/13/2014), Meningioma ([place]), Mild depression, Ovarian cyst, PAF (paroxysmal atrial fibrillation) ([place]), Paroxysmal atrial flutter ([place]), Seizure ([place]) (2006), Severe obesity ([employer]), Sick sinus syndrome ([place]), Sinus node dysfunction ([place]), SOB (shortness of breath) on exertion, Tachycardia-bradycardia syndrome ([place]), Trochlear nerve palsy, and Vertigo. here with :  Seizures History of meningioma  -Continue Topamax 50 mg twice a day -Last CT scan of the brain was in 2021 which was stable.  May consider repeating at the next visit.  The patient can't have MRI of the brain  due to a pacemaker  -Follow-up in 1 year or sooner if needed  Ward Givens, MSN, NP-C 12/25/2022, 12:53 PM [facility] [address] [place], [place] [address] [phone]

## 2022-12-25 NOTE — Patient Instructions (Signed)
Your Plan:  Continue topamax 50 mg twice a day If your symptoms worsen or you develop new symptoms please let us know.   Thank you for coming to see [name] at [facility]. I hope we have been able to provide you high quality care today.  You may receive a patient satisfaction survey over the next few weeks. We would appreciate your feedback and comments so that we may continue to improve ourselves and the health of our patients.

## 2022-12-25 NOTE — Progress Notes (Signed)
Remote pacemaker transmission.   

## 2022-12-31 ENCOUNTER — Other Ambulatory Visit: Payer: Self-pay | Admitting: Cardiovascular Disease

## 2023-02-18 ENCOUNTER — Ambulatory Visit: Payer: Medicare Other | Admitting: Podiatry

## 2023-02-26 ENCOUNTER — Ambulatory Visit: Payer: Medicare Other | Attending: Cardiovascular Disease | Admitting: Cardiovascular Disease

## 2023-02-26 ENCOUNTER — Encounter: Payer: Self-pay | Admitting: Cardiovascular Disease

## 2023-02-26 VITALS — BP 124/62 | HR 60 | Ht 66.0 in | Wt 220.6 lb

## 2023-02-26 DIAGNOSIS — N1832 Chronic kidney disease, stage 3b: Secondary | ICD-10-CM

## 2023-02-26 DIAGNOSIS — I495 Sick sinus syndrome: Secondary | ICD-10-CM

## 2023-02-26 DIAGNOSIS — I2510 Atherosclerotic heart disease of native coronary artery without angina pectoris: Secondary | ICD-10-CM

## 2023-02-26 DIAGNOSIS — I480 Paroxysmal atrial fibrillation: Secondary | ICD-10-CM | POA: Diagnosis not present

## 2023-02-26 DIAGNOSIS — I10 Essential (primary) hypertension: Secondary | ICD-10-CM

## 2023-02-26 DIAGNOSIS — E7800 Pure hypercholesterolemia, unspecified: Secondary | ICD-10-CM

## 2023-02-26 DIAGNOSIS — D6869 Other thrombophilia: Secondary | ICD-10-CM | POA: Diagnosis not present

## 2023-02-26 DIAGNOSIS — I5032 Chronic diastolic (congestive) heart failure: Secondary | ICD-10-CM | POA: Diagnosis not present

## 2023-02-26 NOTE — Patient Instructions (Signed)
Medication Instructions:  No changes *If you need a refill on your cardiac medications before your next appointment, please call your pharmacy*  Follow-Up: At [facility], you and your health needs are our priority.  As part of our continuing mission to provide you with exceptional heart care, we have created designated Provider Care Teams.  These Care Teams include your primary Cardiologist (physician) and Advanced Practice Providers (APPs -  Physician Assistants and Nurse Practitioners) who all work together to provide you with the care you need, when you need it.  We recommend signing up for the patient portal called "MyChart".  Sign up information is provided on this After Visit Summary.  MyChart is used to connect with patients for Virtual Visits (Telemedicine).  Patients are able to view lab/test results, encounter notes, upcoming appointments, etc.  Non-urgent messages can be sent to your provider as well.   To learn more about what you can do with MyChart, go to [email or web address].    Your next appointment:   6 month(s)- Device check  Provider:   Sanda Klein, MD

## 2023-02-26 NOTE — Progress Notes (Signed)
Cardiology Office Note    Date:  02/26/2023   ID:  [name], [name] [birth year], MRN [id number]  PCP:  Willey Blade, MD  Cardiologist:   Sanda Klein, MD   Chief Complaint  Patient presents with   Atrial Fibrillation    History of Present Illness:  [name] is a 89 y.o. female here for follow-up of her dual-chamber permanent pacemaker. The device was implanted in 2010 for tachycardia-bradycardia syndrome (paroxysmal atrial tachycardia, paroxysmal atrial fibrillation, symptomatic sinus bradycardia) and she underwent a generator change out in September 2020.  Overall she is doing well without any new health challenges.  Does complain of some fatigue and dizziness (this can be brought on by simply repositioning her head, not necessarily with standing).  She has not had problems with edema, orthopnea, PND, chest pain frank neurological deficits, falls, bleeding problems or recent gout attacks.  Has not required recent increase in the dose of her diuretic.  Remains unaware of atrial fibrillation, does not have palpitations.  Has not had syncope.  Pacemaker interrogation shows normal device function.  She has roughly 60% atrial pacing with good heart rate histogram distribution and 13% ventricular pacing.  The burden of atrial fibrillation has been high recently at 26%, mostly throughout the winter months, but now appears to be returning to her previous baseline of 10-15%.  Ventricular rate control is uniformly good.  She has not had any meaningful high ventricular rates.  Presenting rhythm is atrial paced ventricular sensed, the ECG is otherwise completely normal today.  Her echocardiogram performed last October 2019 was interpreted as normal diastolic function.  On my review however the diastolic annulus velocities are actually decreased and the E/e' was 13-14, signifying probable volume overload.  She has preserved left ventricular systolic function.  She has never had  embolic events that we are aware of. She has normal left ventricular systolic function by previous echo, normal nuclear stress testing, had minimal CAD on angiography 2011. She does have a history of seizures associated with resection of multiple meningiomas in the past.  No recurrence of seizures while on treatment with Topamax   Past Medical History:  Diagnosis Date   ACL (anterior cruciate ligament) rupture    Left   Acute MI ([place])    Arthritis    Back pain    Secondary to arthritis   Benign paroxysmal positional vertigo 03/18/2016   Brain tumor ([place])    On right side. Removed in 1989   Bruit    Duplex doppler 03/28/05 Mildly abnormal. *Right subclavian artery: Less than 50% diameter reduction.   Chest pain    2D Echo 04/21/08 EF = >55%. Moderate tricuspid regurgitation. Persantine Myovoew stress test 04/21/08 EF = 81%, no evidence of ischemia noted.   Chronic anticoagulation    PAF   Chronic edema    Mild LE edema   Chronic kidney disease    Mild, stage 2   Coronary artery disease    Coronary atherosclerosis    Minimal   Dyslipidemia    GERD (gastroesophageal reflux disease)    Hyperlipidemia    On pravastatin   Memory difficulty 07/13/2014   Meningioma (HCC)    Resection in 1998   Mild depression    Ovarian cyst    Benign. Had a salpiingoophorectomy in 2005.   PAF (paroxysmal atrial fibrillation) (HCC)    On coumadin. [employer] Jude PPM (serial [id number]) inserted 2010   Paroxysmal atrial flutter ([place])    Seizure ([place]) 2006  Severe obesity ([place])    Sick sinus syndrome (HCC)    Sinus node dysfunction (HCC)    S/P implantation of a dual-chamber permanent pacemaker in 2010   SOB (shortness of breath) on exertion    Class II. Catheterization 04/30/10 showed mild CAD with mildly elevated right heart pressures.    Tachycardia-bradycardia syndrome ([place])    S/P implantation of dual-chamber permanent pacemaker in 2010   Trochlear nerve palsy    Right   Vertigo     Past Surgical  History:  Procedure Laterality Date   Brain tumor removal  1989   CARDIAC CATHETERIZATION  04/30/10   Showed mild CAD with mildly elevated right heart pressures. (Dr. Elisabeth Cara)   [place]   (Brain tumor) With gamma knife procedure as well.   KNEE SURGERY     Bilateral, arthroscopic   PACEMAKER INSERTION  04/27/09   Implanted by Dr. Elisabeth Cara. [place] (serial [id number])   PPM GENERATOR CHANGEOUT N/A 09/12/2019   Procedure: PPM GENERATOR CHANGEOUT;  Surgeon: Sanda Klein, MD;  Location: [place]  [facility];  Service: Cardiovascular;  Laterality: N/A;    Current Medications: Outpatient Medications Prior to Visit  Medication Sig Dispense Refill   allopurinol (ZYLOPRIM) 100 MG tablet Take 100 mg by mouth daily.     Carboxymethylcellul-Glycerin (LUBRICATING EYE DROPS OP) Place 1 drop into both eyes daily as needed (dry eyes).     omeprazole (PRILOSEC) 20 MG capsule Take 20 mg by mouth daily.     Polyethyl Glycol-Propyl Glycol (SYSTANE OP) Place 1 drop into both eyes at bedtime as needed (dry eyes).     potassium chloride (KLOR-CON M10) 10 MEQ tablet Take 1 tablet (10 mEq total) by mouth daily. 30 tablet 11   pravastatin (PRAVACHOL) 20 MG tablet Take 20 mg by mouth at bedtime.     topiramate (TOPAMAX) 50 MG tablet Take 1 tablet (50 mg total) by mouth 2 (two) times daily. 60 tablet 5   torsemide (DEMADEX) 20 MG tablet TAKE 2 TABLETS (40 MG TOTAL) BY MOUTH DAILY. 180 tablet 1   traMADol (ULTRAM) 50 MG tablet Take 50 mg by mouth at bedtime as needed for moderate pain (sleep).     verapamil (VERELAN PM) 240 MG 24 hr capsule TAKE 1 CAPSULE (240 MG TOTAL) BY MOUTH AT BEDTIME. 90 capsule 3   warfarin (COUMADIN) 4 MG tablet Take 4 mg by mouth See admin instructions. Take 4 mg at night on Mon, Tue, Wed, Fri, and Sat     warfarin (COUMADIN) 5 MG tablet Take 5 mg by mouth See admin instructions. Take 5 mg at night on Sun and Thur     colchicine 0.6 MG tablet Take 1 tablet (0.6 mg total) by  mouth daily as needed (gout flare). (Patient not taking: Reported on 02/26/2023) 20 tablet 0   nitroGLYCERIN (NITROLINGUAL) 0.4 MG/SPRAY spray Place 3 sprays under the tongue every 5 (five) minutes x 3 doses as needed for chest pain. (Patient not taking: Reported on 02/26/2023) 12 g 3   No facility-administered medications prior to visit.     Allergies:   Dilantin [phenytoin sodium extended] and Phenobarbital   Social History   Socioeconomic History   Marital status: Divorced    Spouse name: Not on file   Number of children: 3   Years of education: Not on file   Highest education level: Not on file  Occupational History    Comment: Retired  Tobacco Use   Smoking status: Former  Years: 20.00    Types: Cigarettes    Quit date: 12/23/2007    Years since quitting: 15.1   Smokeless tobacco: Never  Substance and Sexual Activity   Alcohol use: No    Alcohol/week: 0.0 standard drinks of alcohol   Drug use: No   Sexual activity: Not on file  Other Topics Concern   Not on file  Social History Narrative   Lives with daughter   Right Handed   Drinks 1-2 cups caffeine daily   Social Determinants of Health   Financial Resource Strain: Not on file  Food Insecurity: Not on file  Transportation Needs: Not on file  Physical Activity: Not on file  Stress: Not on file  Social Connections: Not on file     Family History:  The patient's family history includes Heart disease in her brother, brother, brother, father, sister, sister, and sister; Stroke in her mother.   ROS:   Please see the history of present illness.     All other systems are reviewed and are negative.   PHYSICAL EXAM:   VS:  BP 124/62 (BP Location: Left Arm, Patient Position: Sitting, Cuff Size: Large)   Pulse 60   Ht '5\' 6"'$  (1.676 m)   Wt 220 lb 9.6 oz (100.1 kg)   SpO2 97%   BMI 35.61 kg/m      General: Alert, oriented x3, no distress, obese.  Healthy pacemaker site. Head: no evidence of trauma, PERRL, EOMI, no  exophtalmos or lid lag, no myxedema, no xanthelasma; normal ears, nose and oropharynx Neck: normal jugular venous pulsations and no hepatojugular reflux; brisk carotid pulses without delay and no carotid bruits Chest: clear to auscultation, no signs of consolidation by percussion or palpation, normal fremitus, symmetrical and full respiratory excursions Cardiovascular: normal position and quality of the apical impulse, regular rhythm, normal first and second heart sounds, no murmurs, rubs or gallops Abdomen: no tenderness or distention, no masses by palpation, no abnormal pulsatility or arterial bruits, normal bowel sounds, no hepatosplenomegaly Extremities: no clubbing, cyanosis or edema; 2+ radial, ulnar and brachial pulses bilaterally; 2+ right femoral, posterior tibial and dorsalis pedis pulses; 2+ left femoral, posterior tibial and dorsalis pedis pulses; no subclavian or femoral bruits Neurological: grossly nonfocal Psych: Normal mood and affect     Wt Readings from Last 3 Encounters:  02/26/23 220 lb 9.6 oz (100.1 kg)  12/25/22 219 lb 12.8 oz (99.7 kg)  09/01/22 219 lb (99.3 kg)      Studies/Labs Reviewed:   ECHO 11/25/2018  - Left ventricle: The cavity size was mildly reduced. There was   mild focal basal hypertrophy of the septum. Systolic function was   normal. The estimated ejection fraction was in the range of 50%   to 55%. Wall motion was normal; there were no regional wall   motion abnormalities. The study is indeterminate for the   evaluation of LV diastolic function. - Aortic valve: Trileaflet; mildly thickened, mildly calcified   leaflets. Sclerosis without stenosis. - Mitral valve: There was mild regurgitation. - Tricuspid valve: There was trivial regurgitation. - Pulmonary arteries: Systolic pressure was within the normal   range.   Impressions:   - EF 53% by 3D imaging, which agrees with visual and 2D estimates.   GLS mildly abnormal at -16.2%. No significant  wall motion   abnormalities.    EKG: ECG is ordered today and shows atrial paced, ventricular sensed rhythm but is otherwise completely normal.  QTc 400 ms.  Recent Labs:  03/11/2022: BUN 19; Creatinine, Ser 1.44; Hemoglobin 12.5; Platelets 193; Potassium 4.3; Sodium 142  Hemoglobin14.7, creatinine 1.31, potassium 5.0 Lipid Panel  No results found for: "CHOL", "TRIG", "HDL", "CHOLHDL", "VLDL", "LDLCALC", "LDLDIRECT", "LABVLDL" Followed by Dr. Karlton Lemon Will have labs checked next month.  ASSESSMENT:    1. Chronic diastolic congestive heart failure ([place])   2. SSS (sick sinus syndrome) (HCC)   3. Paroxysmal atrial fibrillation ([place])   4. Acquired thrombophilia ([place])   5. Essential hypertension   6. Coronary artery disease involving native coronary artery of native heart without angina pectoris   7. Severe obesity (BMI 35.0-39.9) with comorbidity ([place])   8. Pure hypercholesterolemia   9. Stage 3b chronic kidney disease (HCC)        PLAN:  In order of problems listed above:  CHF: Clinically euvolemic, very sedentary, hard to assess functional status, but no active symptoms.  Has not required increased dose of loop diuretic in months. SSS: Has roughly 2/3 atrial paced rhythm, good heart rate histogram distribution.  Continue current pacer  settings. Afib: Had marked increase in arrhythmia problems over the winter months, but seems to have returned to baseline.  The arrhythmia has always been asymptomatic.  She is well rate controlled in the on appropriate anticoagulation. Anticoagulation: No bleeding problems.  INR is monitored by PCP and she rarely requires warfarin dose adjustments. HTN: Well-controlled CAD: Nonobstructive by remote previous cardiac catheterization.  Denies angina. Obesity: No progress with weight loss.  She is very sedentary and does not appear at all inclined to start exercising. HLP: to have labs checked by Dr. Karlton Lemon next month CKD 3: Most recent creatinine was  1.44, similar to her baseline.  Lab check next month.    Medication Adjustments/Labs and Tests Ordered: Current medicines are reviewed at length with the patient today.  Concerns regarding medicines are outlined above.  Medication changes, Labs and Tests ordered today are listed in the Patient Instructions below. Patient Instructions  Medication Instructions:  No changes *If you need a refill on your cardiac medications before your next appointment, please call your pharmacy*  Follow-Up: At [facility], you and your health needs are our priority.  As part of our continuing mission to provide you with exceptional heart care, we have created designated Provider Care Teams.  These Care Teams include your primary Cardiologist (physician) and Advanced Practice Providers (APPs -  Physician Assistants and Nurse Practitioners) who all work together to provide you with the care you need, when you need it.  We recommend signing up for the patient portal called "MyChart".  Sign up information is provided on this After Visit Summary.  MyChart is used to connect with patients for Virtual Visits (Telemedicine).  Patients are able to view lab/test results, encounter notes, upcoming appointments, etc.  Non-urgent messages can be sent to your provider as well.   To learn more about what you can do with MyChart, go to [email or web address].    Your next appointment:   6 month(s)- Device check  Provider:   Sanda Klein, MD          Signed, Sanda Klein, MD  02/26/2023 2:53 PM    [address], [place], [place]  [address] Phone: [phone]; Fax: [phone]: There is

## 2023-03-02 ENCOUNTER — Other Ambulatory Visit: Payer: Self-pay | Admitting: Cardiovascular Disease

## 2023-03-03 ENCOUNTER — Other Ambulatory Visit: Payer: Self-pay

## 2023-03-06 ENCOUNTER — Ambulatory Visit: Payer: Medicare Other

## 2023-03-06 DIAGNOSIS — I495 Sick sinus syndrome: Secondary | ICD-10-CM

## 2023-03-06 LAB — CUP PACEART REMOTE DEVICE CHECK
Battery Remaining Longevity: 74 mo
Battery Remaining Percentage: 68 %
Battery Voltage: 2.99 V
Brady Statistic AP VP Percent: 1 %
Brady Statistic AP VS Percent: 94 %
Brady Statistic AS VP Percent: 1 %
Brady Statistic AS VS Percent: 4.7 %
Brady Statistic RA Percent Paced: 89 %
Brady Statistic RV Percent Paced: 4.3 %
Date Time Interrogation Session: 20240315020014
Implantable Lead Connection Status: 753985
Implantable Lead Connection Status: 753985
Implantable Lead Implant Date: 20100507
Implantable Lead Implant Date: 20100507
Implantable Lead Location: 753859
Implantable Lead Location: 753860
Implantable Pulse Generator Implant Date: 20200921
Lead Channel Impedance Value: 360 Ohm
Lead Channel Impedance Value: 450 Ohm
Lead Channel Pacing Threshold Amplitude: 0.5 V
Lead Channel Pacing Threshold Amplitude: 1 V
Lead Channel Pacing Threshold Pulse Width: 0.5 ms
Lead Channel Pacing Threshold Pulse Width: 0.5 ms
Lead Channel Sensing Intrinsic Amplitude: 12 mV
Lead Channel Sensing Intrinsic Amplitude: 3.6 mV
Lead Channel Setting Pacing Amplitude: 1.5 V
Lead Channel Setting Pacing Amplitude: 2.5 V
Lead Channel Setting Pacing Pulse Width: 0.5 ms
Lead Channel Setting Sensing Sensitivity: 4 mV
Pulse Gen Model: 2272
Pulse Gen Serial Number: 9166074

## 2023-03-23 ENCOUNTER — Encounter: Payer: Self-pay | Admitting: Podiatry

## 2023-03-23 ENCOUNTER — Ambulatory Visit: Payer: Medicare Other | Admitting: Podiatry

## 2023-03-23 VITALS — BP 156/60 | HR 69

## 2023-03-23 DIAGNOSIS — N183 Chronic kidney disease, stage 3 unspecified: Secondary | ICD-10-CM | POA: Diagnosis not present

## 2023-03-23 DIAGNOSIS — M79674 Pain in right toe(s): Secondary | ICD-10-CM | POA: Diagnosis not present

## 2023-03-23 DIAGNOSIS — M79675 Pain in left toe(s): Secondary | ICD-10-CM | POA: Diagnosis not present

## 2023-03-23 DIAGNOSIS — B351 Tinea unguium: Secondary | ICD-10-CM

## 2023-03-23 NOTE — Progress Notes (Signed)
This patient returns to my office for at risk foot care.  This patient requires this care by a professional since this patient will be at risk due to having coagulation defect and  CKD   This patient is unable to cut nails herself since the patient cannot reach her nails.These nails are painful walking and wearing shoes.  This patient presents for at risk foot care today. Patient presents to the office with her daughter.  General Appearance  Alert, conversant and in no acute stress.  Vascular  Dorsalis pedis and posterior tibial  pulses are not  palpable  Left foot.  Dorsalis pedis pulses are weakly palpable right foot due to swelling..  Capillary return is within normal limits  bilaterally. Temperature is within normal limits  bilaterally.  Neurologic  Senn-Weinstein monofilament wire test within normal limits  bilaterally. Muscle power within normal limits bilaterally.  Nails Thick disfigured discolored nails with subungual debris  from hallux to fifth toes bilaterally. No evidence of bacterial infection or drainage bilaterally.  Orthopedic  No limitations of motion  feet .  No crepitus or effusions noted.  No bony pathology .  Hammer toes 2,3 left foot.  Skin  normotropic skin with no porokeratosis noted bilaterally.  No signs of infections or ulcers noted.     Onychomycosis  Pain in right toes  Pain in left toes  Consent was obtained for treatment procedures.   Mechanical debridement of nails 1-5  bilaterally performed with a nail nipper.  Filed with dremel without incident.    Return office visit  10 weeks                    Told patient to return for periodic foot care and evaluation due to potential at risk complications.   Jennae Hakeem DPM  

## 2023-04-06 NOTE — Progress Notes (Signed)
Remote pacemaker transmission.   

## 2023-05-14 ENCOUNTER — Other Ambulatory Visit: Payer: Self-pay | Admitting: Cardiovascular Disease

## 2023-06-03 ENCOUNTER — Ambulatory Visit: Payer: Medicare Other | Admitting: Podiatry

## 2023-06-03 DIAGNOSIS — M79675 Pain in left toe(s): Secondary | ICD-10-CM

## 2023-06-03 DIAGNOSIS — N183 Chronic kidney disease, stage 3 unspecified: Secondary | ICD-10-CM | POA: Diagnosis not present

## 2023-06-03 DIAGNOSIS — D689 Coagulation defect, unspecified: Secondary | ICD-10-CM | POA: Diagnosis not present

## 2023-06-03 DIAGNOSIS — M79674 Pain in right toe(s): Secondary | ICD-10-CM

## 2023-06-03 DIAGNOSIS — B351 Tinea unguium: Secondary | ICD-10-CM | POA: Diagnosis not present

## 2023-06-03 NOTE — Progress Notes (Signed)
This patient returns to my office for at risk foot care.  This patient requires this care by a professional since this patient will be at risk due to having coagulation defect and  CKD   This patient is unable to cut nails herself since the patient cannot reach her nails.These nails are painful walking and wearing shoes.  This patient presents for at risk foot care today. Patient presents to the office with her daughter.  General Appearance  Alert, conversant and in no acute stress.  Vascular  Dorsalis pedis and posterior tibial  pulses are not  palpable  Left foot.  Dorsalis pedis pulses are weakly palpable right foot due to swelling..  Capillary return is within normal limits  bilaterally. Temperature is within normal limits  bilaterally.  Neurologic  Senn-Weinstein monofilament wire test within normal limits  bilaterally. Muscle power within normal limits bilaterally.  Nails Thick disfigured discolored nails with subungual debris  from hallux to fifth toes bilaterally. No evidence of bacterial infection or drainage bilaterally.  Orthopedic  No limitations of motion  feet .  No crepitus or effusions noted.  No bony pathology .  Hammer toes 2,3 left foot.  Skin  normotropic skin with no porokeratosis noted bilaterally.  No signs of infections or ulcers noted.     Onychomycosis  Pain in right toes  Pain in left toes  Consent was obtained for treatment procedures.   Mechanical debridement of nails 1-5  bilaterally performed with a nail nipper.  Filed with dremel without incident.    Return office visit  10 weeks                    Told patient to return for periodic foot care and evaluation due to potential at risk complications.   Bowie Doiron DPM  

## 2023-06-05 LAB — CUP PACEART REMOTE DEVICE CHECK
Battery Remaining Longevity: 71 mo
Battery Remaining Percentage: 66 %
Battery Voltage: 2.99 V
Brady Statistic AP VP Percent: 1 %
Brady Statistic AP VS Percent: 94 %
Brady Statistic AS VP Percent: 1 %
Brady Statistic AS VS Percent: 5 %
Brady Statistic RA Percent Paced: 77 %
Brady Statistic RV Percent Paced: 9.4 %
Date Time Interrogation Session: 20240614020016
Implantable Lead Connection Status: 753985
Implantable Lead Connection Status: 753985
Implantable Lead Implant Date: 20100507
Implantable Lead Implant Date: 20100507
Implantable Lead Location: 753859
Implantable Lead Location: 753860
Implantable Pulse Generator Implant Date: 20200921
Lead Channel Impedance Value: 350 Ohm
Lead Channel Impedance Value: 490 Ohm
Lead Channel Pacing Threshold Amplitude: 0.5 V
Lead Channel Pacing Threshold Amplitude: 1 V
Lead Channel Pacing Threshold Pulse Width: 0.5 ms
Lead Channel Pacing Threshold Pulse Width: 0.5 ms
Lead Channel Sensing Intrinsic Amplitude: 12 mV
Lead Channel Sensing Intrinsic Amplitude: 4.2 mV
Lead Channel Setting Pacing Amplitude: 1.5 V
Lead Channel Setting Pacing Amplitude: 2.5 V
Lead Channel Setting Pacing Pulse Width: 0.5 ms
Lead Channel Setting Sensing Sensitivity: 4 mV
Pulse Gen Model: 2272
Pulse Gen Serial Number: 9166074

## 2023-06-08 ENCOUNTER — Ambulatory Visit (INDEPENDENT_AMBULATORY_CARE_PROVIDER_SITE_OTHER): Payer: Medicare Other

## 2023-06-08 DIAGNOSIS — I495 Sick sinus syndrome: Secondary | ICD-10-CM | POA: Diagnosis not present

## 2023-06-29 NOTE — Progress Notes (Signed)
Remote pacemaker transmission.   

## 2023-07-31 ENCOUNTER — Other Ambulatory Visit: Payer: Self-pay | Admitting: Cardiovascular Disease

## 2023-08-12 ENCOUNTER — Ambulatory Visit: Payer: Medicare Other | Admitting: Podiatry

## 2023-08-21 ENCOUNTER — Other Ambulatory Visit: Payer: Self-pay | Admitting: Adult Health

## 2023-08-25 NOTE — Telephone Encounter (Signed)
Rx refilled per last office visit note.

## 2023-08-27 ENCOUNTER — Encounter: Payer: Self-pay | Admitting: Cardiovascular Disease

## 2023-08-27 ENCOUNTER — Ambulatory Visit: Payer: Medicare Other | Attending: Cardiovascular Disease | Admitting: Cardiovascular Disease

## 2023-08-27 VITALS — BP 112/54 | HR 61 | Ht 65.0 in | Wt 199.0 lb

## 2023-08-27 DIAGNOSIS — I5032 Chronic diastolic (congestive) heart failure: Secondary | ICD-10-CM | POA: Diagnosis not present

## 2023-08-27 DIAGNOSIS — I495 Sick sinus syndrome: Secondary | ICD-10-CM

## 2023-08-27 DIAGNOSIS — E7800 Pure hypercholesterolemia, unspecified: Secondary | ICD-10-CM

## 2023-08-27 DIAGNOSIS — I480 Paroxysmal atrial fibrillation: Secondary | ICD-10-CM

## 2023-08-27 DIAGNOSIS — D6869 Other thrombophilia: Secondary | ICD-10-CM

## 2023-08-27 DIAGNOSIS — I10 Essential (primary) hypertension: Secondary | ICD-10-CM

## 2023-08-27 DIAGNOSIS — N1832 Chronic kidney disease, stage 3b: Secondary | ICD-10-CM

## 2023-08-27 DIAGNOSIS — Z950 Presence of cardiac pacemaker: Secondary | ICD-10-CM | POA: Diagnosis not present

## 2023-08-27 DIAGNOSIS — I2510 Atherosclerotic heart disease of native coronary artery without angina pectoris: Secondary | ICD-10-CM

## 2023-08-27 MED ORDER — VERAPAMIL HCL ER 180 MG PO CP24: 180.0000 mg | ORAL_CAPSULE | Freq: Every day | ORAL | 3 refills | Status: DC

## 2023-08-27 NOTE — Progress Notes (Signed)
Cardiology Office Note    Date:  08/27/2023   ID:  Chua, Savoy 1932/10/24, MRN [id number]  PCP:  Andi Devon, MD  Cardiologist:   Thurmon Fair, MD   No chief complaint on file.   History of Present Illness:  [name] is a 89 y.o. female here for follow-up of her dual-chamber permanent pacemaker. The device was implanted in 2010 for tachycardia-bradycardia syndrome (paroxysmal atrial tachycardia, paroxysmal atrial fibrillation, symptomatic sinus bradycardia) and she underwent a generator change out in September 2020.  She bemoans the fact that she is getting older and has arthritis in her back and shoulders causing a lot of discomfort.  No longer able to willing to use her stationary bicycle.  She has lost quite a bit of weight.  She no longer has issues with lower extremity edema and has not required any diuretic in more than a month, although she still has torsemide available as needed.  She has occasional episodes of dizziness.  Her blood pressure is lower than in the past.  She denies orthopnea, PND, chest pain, new neurological complaints, falls, active bleeding recent gout attacks.  Unaware of palpitations when she has atrial fibrillation, although the burden of arrhythmia remains high at around 19%.  Pacemaker interrogation shows normal device function with estimated generator longevity of 5-6 years.  The burden of atrial fibrillation over the last 6 months has been around 19%, in keeping with her recent historical trend.  She has not had any issues with sustained rapid ventricular rates.  She has not had any ventricular rhythms.  Lead parameters doing excellent.  Heart rate histogram is consistent with her sedentary lifestyle.  Presenting rhythm is atrial paced ventricular sensed, the ECG is otherwise completely normal today.  Her echocardiogram performed last October 2019 was interpreted as normal diastolic function.  On my review however the diastolic  annulus velocities are actually decreased and the E/e' was 13-14, signifying probable volume overload.  She has preserved left ventricular systolic function.  She has never had embolic events that we are aware of. She has normal left ventricular systolic function by previous echo, normal nuclear stress testing, had minimal CAD on angiography 2011. She does have a history of seizures associated with resection of multiple meningiomas in the past.  No recurrence of seizures while on treatment with Topamax   Past Medical History:  Diagnosis Date   ACL (anterior cruciate ligament) rupture    Left   Acute MI (HCC)    Arthritis    Back pain    Secondary to arthritis   Benign paroxysmal positional vertigo 03/18/2016   Brain tumor (HCC)    On right side. Removed in 1989   Bruit    Duplex doppler 03/28/05 Mildly abnormal. *Right subclavian artery: Less than 50% diameter reduction.   Chest pain    2D Echo 04/21/08 EF = >55%. Moderate tricuspid regurgitation. Persantine Myovoew stress test 04/21/08 EF = 81%, no evidence of ischemia noted.   Chronic anticoagulation    PAF   Chronic edema    Mild LE edema   Chronic kidney disease    Mild, stage 2   Coronary artery disease    Coronary atherosclerosis    Minimal   Dyslipidemia    GERD (gastroesophageal reflux disease)    Hyperlipidemia    On pravastatin   Memory difficulty 07/13/2014   Meningioma (HCC)    Resection in 1998   Mild depression    Ovarian cyst  Benign. Had a salpiingoophorectomy in 2005.   PAF (paroxysmal atrial fibrillation) (HCC)    On coumadin. St. Jude PPM (serial [id number]) inserted 2010   Paroxysmal atrial flutter (HCC)    Seizure (HCC) 2006   Severe obesity (HCC)    Sick sinus syndrome (HCC)    Sinus node dysfunction (HCC)    S/P implantation of a dual-chamber permanent pacemaker in 2010   SOB (shortness of breath) on exertion    Class II. Catheterization 04/30/10 showed mild CAD with mildly elevated right heart pressures.     Tachycardia-bradycardia syndrome (HCC)    S/P implantation of dual-chamber permanent pacemaker in 2010   Trochlear nerve palsy    Right   Vertigo     Past Surgical History:  Procedure Laterality Date   Brain tumor removal  1989   CARDIAC CATHETERIZATION  04/30/10   Showed mild CAD with mildly elevated right heart pressures. (Dr. Lynnea Ferrier)   CRANIOTOMY  1989   (Brain tumor) With gamma knife procedure as well.   KNEE SURGERY     Bilateral, arthroscopic   PACEMAKER INSERTION  04/27/09   Implanted by Dr. Lynnea Ferrier. St. Jude Accent DR (serial [id number])   PPM GENERATOR CHANGEOUT N/A 09/12/2019   Procedure: PPM GENERATOR CHANGEOUT;  Surgeon: Thurmon Fair, MD;  Location: [facility];  Service: Cardiovascular;  Laterality: N/A;    Current Medications: Outpatient Medications Prior to Visit  Medication Sig Dispense Refill   allopurinol (ZYLOPRIM) 100 MG tablet Take 100 mg by mouth daily.     HYDROcodone-acetaminophen (NORCO/VICODIN) 5-325 MG tablet Take 1 tablet by mouth every 6 (six) hours as needed.     omeprazole (PRILOSEC) 20 MG capsule Take 20 mg by mouth daily.     pravastatin (PRAVACHOL) 20 MG tablet Take 20 mg by mouth at bedtime.     warfarin (COUMADIN) 4 MG tablet Take 4 mg by mouth See admin instructions. Take 4 mg at night on Mon, Tue, Wed, Fri, and Sat     verapamil (VERELAN PM) 240 MG 24 hr capsule TAKE 1 CAPSULE (240 MG TOTAL) BY MOUTH AT BEDTIME. 90 capsule 2   Carboxymethylcellul-Glycerin (LUBRICATING EYE DROPS OP) Place 1 drop into both eyes daily as needed (dry eyes). (Patient not taking: Reported on 08/27/2023)     colchicine 0.6 MG tablet Take 1 tablet (0.6 mg total) by mouth daily as needed (gout flare). (Patient not taking: Reported on 08/27/2023) 20 tablet 0   KLOR-CON M10 10 MEQ tablet TAKE 1 TABLET BY MOUTH EVERY DAY (Patient not taking: Reported on 08/27/2023) 90 tablet 3   nitroGLYCERIN (NITROLINGUAL) 0.4 MG/SPRAY spray Place 3 sprays under the tongue every 5  (five) minutes x 3 doses as needed for chest pain. (Patient not taking: Reported on 08/27/2023) 12 g 3   Polyethyl Glycol-Propyl Glycol (SYSTANE OP) Place 1 drop into both eyes at bedtime as needed (dry eyes). (Patient not taking: Reported on 08/27/2023)     topiramate (TOPAMAX) 50 MG tablet TAKE 1 TABLET BY MOUTH TWICE A DAY (Patient not taking: Reported on 08/27/2023) 180 tablet 1   torsemide (DEMADEX) 20 MG tablet TAKE 2 TABLETS (40 MG TOTAL) BY MOUTH DAILY. (Patient not taking: Reported on 08/27/2023) 180 tablet 2   traMADol (ULTRAM) 50 MG tablet Take 50 mg by mouth at bedtime as needed for moderate pain (sleep). (Patient not taking: Reported on 08/27/2023)     warfarin (COUMADIN) 5 MG tablet Take 5 mg by mouth See admin instructions. Take 5 mg at  night on Sun and [place] Cao (Patient not taking: Reported on 08/27/2023)     No facility-administered medications prior to visit.     Allergies:   Dilantin [phenytoin sodium extended] and Phenobarbital   Social History   Socioeconomic History   Marital status: Divorced    Spouse name: Not on file   Number of children: 3   Years of education: Not on file   Highest education level: Not on file  Occupational History    Comment: Retired  Tobacco Use   Smoking status: Former    Current packs/day: 0.00    Types: Cigarettes    Start date: 12/23/1987    Quit date: 12/23/2007    Years since quitting: 15.6   Smokeless tobacco: Never  Substance and Sexual Activity   Alcohol use: No    Alcohol/week: 0.0 standard drinks of alcohol   Drug use: No   Sexual activity: Not on file  Other Topics Concern   Not on file  Social History Narrative   Lives with daughter   Right Handed   Drinks 1-2 cups caffeine daily   Social Determinants of Health   Financial Resource Strain: Not on file  Food Insecurity: Not on file  Transportation Needs: Not on file  Physical Activity: Not on file  Stress: Not on file  Social Connections: Not on file     Family History:  The  patient's family history includes Heart disease in her brother, brother, brother, father, sister, sister, and sister; Stroke in her mother.   ROS:   Please see the history of present illness.     All other systems are reviewed and are negative.   PHYSICAL EXAM:   VS:  BP (!) 112/54 (BP Location: Left Arm, Patient Position: Sitting, Cuff Size: Large)   Pulse 61   Ht 5\' 5"  (1.651 m)   Wt 199 lb (90.3 kg)   SpO2 96%   BMI 33.12 kg/m      General: Alert, oriented x3, no distress, obese.  Healthy pacemaker site. Head: no evidence of trauma, PERRL, EOMI, no exophtalmos or lid lag, no myxedema, no xanthelasma; normal ears, nose and oropharynx Neck: normal jugular venous pulsations and no hepatojugular reflux; brisk carotid pulses without delay and no carotid bruits Chest: clear to auscultation, no signs of consolidation by percussion or palpation, normal fremitus, symmetrical and full respiratory excursions Cardiovascular: normal position and quality of the apical impulse, regular rhythm, normal first and second heart sounds, no murmurs, rubs or gallops Abdomen: no tenderness or distention, no masses by palpation, no abnormal pulsatility or arterial bruits, normal bowel sounds, no hepatosplenomegaly Extremities: no clubbing, cyanosis or edema; 2+ radial, ulnar and brachial pulses bilaterally; 2+ right femoral, posterior tibial and dorsalis pedis pulses; 2+ left femoral, posterior tibial and dorsalis pedis pulses; no subclavian or femoral bruits Neurological: grossly nonfocal Psych: Normal mood and affect     Wt Readings from Last 3 Encounters:  08/27/23 199 lb (90.3 kg)  02/26/23 220 lb 9.6 oz (100.1 kg)  12/25/22 219 lb 12.8 oz (99.7 kg)      Studies/Labs Reviewed:   ECHO 11/25/2018  - Left ventricle: The cavity size was mildly reduced. There was   mild focal basal hypertrophy of the septum. Systolic function was   normal. The estimated ejection fraction was in the range of 50%    to 55%. Wall motion was normal; there were no regional wall   motion abnormalities. The study is indeterminate for the   evaluation of  LV diastolic function. - Aortic valve: Trileaflet; mildly thickened, mildly calcified   leaflets. Sclerosis without stenosis. - Mitral valve: There was mild regurgitation. - Tricuspid valve: There was trivial regurgitation. - Pulmonary arteries: Systolic pressure was within the normal   range.   Impressions:   - EF 53% by 3D imaging, which agrees with visual and 2D estimates.   GLS mildly abnormal at -16.2%. No significant wall motion   abnormalities.    EKG: ECG is ordered today and shows atrial paced, ventricular sensed rhythm, otherwise normal tracing.  Recent Labs: No results found for requested labs within last 365 days.  Hemoglobin14.7, creatinine 1.31, potassium 5.0 Lipid Panel  No results found for: "CHOL", "TRIG", "HDL", "CHOLHDL", "VLDL", "LDLCALC", "LDLDIRECT", "LABVLDL" Followed by Dr. Renae Gloss   ASSESSMENT:    1. Chronic diastolic congestive heart failure (HCC)   2. SSS (sick sinus syndrome) (HCC)   3. Pacemaker   4. Paroxysmal atrial fibrillation (HCC)   5. Acquired thrombophilia (HCC)   6. Essential hypertension   7. Coronary artery disease involving native coronary artery of native heart without angina pectoris   8. Pure hypercholesterolemia   9. Stage 3b chronic kidney disease (HCC)        PLAN:  In order of problems listed above:  CHF: No longer requiring daily diuretics and appears clinically euvolemic.  Functional status hard to assess due to her sedentary lifestyle. SSS: Roughly 75% atrial pacing with appropriate heart rate histogram.  Will reduce the verapamil dose. Afib: Asymptomatic, relatively high but stable burden of atrial fibrillation around 19%.  Rate control is excellent. Anticoagulation: No active bleeding issues.  Prothrombin time monitored by PCP. HTN: With her weight loss, her diastolic blood pressure  is quite low.  Reduce the dose of verapamil. CAD: Nonobstructive by remote previous cardiac catheterization.  Does not have angina pectoris. Obesity: She is lost quite a bit of weight, although she remains in mildly obese range.  She sounds like she has problems with depression. HLP: Labs followed by PCP. CKD 3: Most recent creatinine was 1.44, similar to her baseline.     Medication Adjustments/Labs and Tests Ordered: Current medicines are reviewed at length with the patient today.  Concerns regarding medicines are outlined above.  Medication changes, Labs and Tests ordered today are listed in the Patient Instructions below. Patient Instructions  Medication Instructions:  Decrease Verapamil to 180 mg every night *If you need a refill on your cardiac medications before your next appointment, please call your pharmacy*  Follow-Up: At [facility], you and your health needs are our priority.  As part of our continuing mission to provide you with exceptional heart care, we have created designated Provider Care Teams.  These Care Teams include your primary Cardiologist (physician) and Advanced Practice Providers (APPs -  Physician Assistants and Nurse Practitioners) who all work together to provide you with the care you need, when you need it.  We recommend signing up for the patient portal called "MyChart".  Sign up information is provided on this After Visit Summary.  MyChart is used to connect with patients for Virtual Visits (Telemedicine).  Patients are able to view lab/test results, encounter notes, upcoming appointments, etc.  Non-urgent messages can be sent to your provider as well.   To learn more about what you can do with MyChart, go to [email or web address].    Your next appointment:   6 month(s)  Provider:   Thurmon Fair, MD  Signed, Thurmon Fair, MD  08/27/2023 9:15 AM    [facility] [address] [place], [place], [place]   [address] Phone: [phone]; Fax: [phone]: There is

## 2023-08-27 NOTE — Patient Instructions (Signed)
Medication Instructions:  Decrease Verapamil to 180 mg every night *If you need a refill on your cardiac medications before your next appointment, please call your pharmacy*  Follow-Up: At [facility], you and your health needs are our priority.  As part of our continuing mission to provide you with exceptional heart care, we have created designated Provider Care Teams.  These Care Teams include your primary Cardiologist (physician) and Advanced Practice Providers (APPs -  Physician Assistants and Nurse Practitioners) who all work together to provide you with the care you need, when you need it.  We recommend signing up for the patient portal called "MyChart".  Sign up information is provided on this After Visit Summary.  MyChart is used to connect with patients for Virtual Visits (Telemedicine).  Patients are able to view lab/test results, encounter notes, upcoming appointments, etc.  Non-urgent messages can be sent to your provider as well.   To learn more about what you can do with MyChart, go to [email or web address].    Your next appointment:   6 month(s)  Provider:   Thurmon Fair, MD

## 2023-08-31 ENCOUNTER — Encounter: Payer: Self-pay | Admitting: Cardiovascular Disease

## 2023-09-04 ENCOUNTER — Ambulatory Visit: Payer: Medicare Other | Admitting: Podiatry

## 2023-09-06 LAB — CUP PACEART REMOTE DEVICE CHECK
Battery Remaining Longevity: 70 mo
Battery Remaining Percentage: 64 %
Battery Voltage: 2.99 V
Brady Statistic AP VP Percent: 1 %
Brady Statistic AP VS Percent: 93 %
Brady Statistic AS VP Percent: 1 %
Brady Statistic AS VS Percent: 6.1 %
Brady Statistic RA Percent Paced: 75 %
Brady Statistic RV Percent Paced: 9.2 %
Date Time Interrogation Session: 20240913020017
Implantable Lead Connection Status: 753985
Implantable Lead Connection Status: 753985
Implantable Lead Implant Date: 20100507
Implantable Lead Implant Date: 20100507
Implantable Lead Location: 753859
Implantable Lead Location: 753860
Implantable Pulse Generator Implant Date: 20200921
Lead Channel Impedance Value: 360 Ohm
Lead Channel Impedance Value: 490 Ohm
Lead Channel Pacing Threshold Amplitude: 0.625 V
Lead Channel Pacing Threshold Amplitude: 1 V
Lead Channel Pacing Threshold Pulse Width: 0.5 ms
Lead Channel Pacing Threshold Pulse Width: 0.5 ms
Lead Channel Sensing Intrinsic Amplitude: 12 mV
Lead Channel Sensing Intrinsic Amplitude: 3.7 mV
Lead Channel Setting Pacing Amplitude: 1.625
Lead Channel Setting Pacing Amplitude: 2.5 V
Lead Channel Setting Pacing Pulse Width: 0.5 ms
Lead Channel Setting Sensing Sensitivity: 4 mV
Pulse Gen Model: 2272
Pulse Gen Serial Number: 9166074

## 2023-09-07 ENCOUNTER — Ambulatory Visit (INDEPENDENT_AMBULATORY_CARE_PROVIDER_SITE_OTHER): Payer: Medicare Other

## 2023-09-07 DIAGNOSIS — I495 Sick sinus syndrome: Secondary | ICD-10-CM | POA: Diagnosis not present

## 2023-09-08 ENCOUNTER — Encounter: Payer: Self-pay | Admitting: Podiatry

## 2023-09-08 ENCOUNTER — Ambulatory Visit: Payer: Medicare Other | Admitting: Podiatry

## 2023-09-08 DIAGNOSIS — B351 Tinea unguium: Secondary | ICD-10-CM | POA: Diagnosis not present

## 2023-09-08 DIAGNOSIS — N183 Chronic kidney disease, stage 3 unspecified: Secondary | ICD-10-CM

## 2023-09-08 DIAGNOSIS — M79675 Pain in left toe(s): Secondary | ICD-10-CM

## 2023-09-08 DIAGNOSIS — M79674 Pain in right toe(s): Secondary | ICD-10-CM

## 2023-09-08 NOTE — Progress Notes (Signed)
This patient returns to my office for at risk foot care.  This patient requires this care by a professional since this patient will be at risk due to having coagulation defect and  CKD   This patient is unable to cut nails herself since the patient cannot reach her nails.These nails are painful walking and wearing shoes.  This patient presents for at risk foot care today. Patient presents to the office with her daughter.  General Appearance  Alert, conversant and in no acute stress.  Vascular  Dorsalis pedis and posterior tibial  pulses are not  palpable  Left foot.  Dorsalis pedis pulses are weakly palpable right foot due to swelling..  Capillary return is within normal limits  bilaterally. Temperature is within normal limits  bilaterally.  Neurologic  Senn-Weinstein monofilament wire test within normal limits  bilaterally. Muscle power within normal limits bilaterally.  Nails Thick disfigured discolored nails with subungual debris  from hallux to fifth toes bilaterally. No evidence of bacterial infection or drainage bilaterally.  Orthopedic  No limitations of motion  feet .  No crepitus or effusions noted.  No bony pathology .  Hammer toes 2,3 left foot.  Skin  normotropic skin with no porokeratosis noted bilaterally.  No signs of infections or ulcers noted.     Onychomycosis  Pain in right toes  Pain in left toes  Consent was obtained for treatment procedures.   Mechanical debridement of nails 1-5  bilaterally performed with a nail nipper.  Filed with dremel without incident.    Return office visit  9   weeks                    Told patient to return for periodic foot care and evaluation due to potential at risk complications.   Helane Gunther DPM

## 2023-09-21 NOTE — Progress Notes (Signed)
Remote pacemaker transmission.   

## 2023-12-06 LAB — CUP PACEART REMOTE DEVICE CHECK
Battery Remaining Longevity: 65 mo
Battery Remaining Percentage: 61 %
Battery Voltage: 2.99 V
Brady Statistic AP VP Percent: 1 %
Brady Statistic AP VS Percent: 93 %
Brady Statistic AS VP Percent: 1 %
Brady Statistic AS VS Percent: 6 %
Brady Statistic RA Percent Paced: 77 %
Brady Statistic RV Percent Paced: 8.3 %
Date Time Interrogation Session: 20241213035645
Implantable Lead Connection Status: 753985
Implantable Lead Connection Status: 753985
Implantable Lead Implant Date: 20100507
Implantable Lead Implant Date: 20100507
Implantable Lead Location: 753859
Implantable Lead Location: 753860
Implantable Pulse Generator Implant Date: 20200921
Lead Channel Impedance Value: 330 Ohm
Lead Channel Impedance Value: 460 Ohm
Lead Channel Pacing Threshold Amplitude: 0.625 V
Lead Channel Pacing Threshold Amplitude: 1 V
Lead Channel Pacing Threshold Pulse Width: 0.5 ms
Lead Channel Pacing Threshold Pulse Width: 0.5 ms
Lead Channel Sensing Intrinsic Amplitude: 12 mV
Lead Channel Sensing Intrinsic Amplitude: 3.8 mV
Lead Channel Setting Pacing Amplitude: 1.625
Lead Channel Setting Pacing Amplitude: 2.5 V
Lead Channel Setting Pacing Pulse Width: 0.5 ms
Lead Channel Setting Sensing Sensitivity: 4 mV
Pulse Gen Model: 2272
Pulse Gen Serial Number: 9166074

## 2023-12-07 ENCOUNTER — Ambulatory Visit (INDEPENDENT_AMBULATORY_CARE_PROVIDER_SITE_OTHER): Payer: Medicare Other

## 2023-12-07 DIAGNOSIS — I495 Sick sinus syndrome: Secondary | ICD-10-CM | POA: Diagnosis not present

## 2023-12-08 ENCOUNTER — Ambulatory Visit: Payer: Medicare Other | Admitting: Podiatry

## 2023-12-20 ENCOUNTER — Other Ambulatory Visit: Payer: Self-pay | Admitting: Adult Health

## 2023-12-20 ENCOUNTER — Other Ambulatory Visit: Payer: Self-pay | Admitting: Cardiovascular Disease

## 2023-12-30 ENCOUNTER — Telehealth: Payer: Self-pay | Admitting: Adult Health

## 2023-12-30 NOTE — Telephone Encounter (Signed)
 Pt rescheduled appt due to not feeling good.

## 2023-12-31 ENCOUNTER — Ambulatory Visit: Payer: Medicare Other | Admitting: Adult Health

## 2024-01-11 NOTE — Progress Notes (Signed)
Remote pacemaker transmission.   

## 2024-02-18 ENCOUNTER — Telehealth: Payer: Self-pay | Admitting: Adult Health

## 2024-02-18 NOTE — Telephone Encounter (Signed)
 Pt said 3 days ago started having terrible headaches, effect vision. Have taken Tylenol at night, do not relieve headache during the day. Would like a call from the nurse

## 2024-02-18 NOTE — Telephone Encounter (Signed)
 Megan and I have both tried to call the patient back but we are receiving messages that call can't be completed. I did confirm we tried to reach the same phone number as called before [phone].

## 2024-02-18 NOTE — Telephone Encounter (Signed)
 I called the patient back and her daughter answered.  She said the patient was needing to schedule an appointment with [facility].  I informed her that her next appointment is scheduled for July with [facility] NP. She asked me when her next appointment was. I told her it was this coming Monday. The daughter said the reason for the appointment is because of the patient's headache and that her eye is affected.  She states the patient describes this as a film over her eye and that it is "affecting her periphery".  She states that a new home visit nurse came out to see her today and when she heard of the symptoms she said that the patient needed to be seen by a doctor.  The daughter said when the patient gets scared like that she just needs a doctor to tell her everything is okay. I told her that an acute headache that is causing visual trouble etc. should be addressed right away by ER or urgent care even.  The patient does have a history of meningioma and the daughter states she does have baseline issues with her eye. The daughter relayed to the patient and she said they wanted to come see Megan.  I advised that  for a new issue of this sort she does not need to wait for appointment here and needs to see the ER sooner.  I tried to ask if the patient was having any other symptoms like weakness, numbness and she handed the phone to the patient.  The patient said sometimes her hand bothers her and she cannot close her fingers and open them.  I reiterated to her that we recommend the ER and she said she would call a doctor and hung up the phone on me.  I talked to [facility] NP and she will review.

## 2024-02-19 ENCOUNTER — Other Ambulatory Visit: Payer: Self-pay

## 2024-02-19 ENCOUNTER — Encounter (HOSPITAL_COMMUNITY): Payer: Self-pay

## 2024-02-19 ENCOUNTER — Emergency Department (HOSPITAL_COMMUNITY): Payer: Medicare Other

## 2024-02-19 ENCOUNTER — Emergency Department (HOSPITAL_COMMUNITY)
Admission: EM | Admit: 2024-02-19 | Discharge: 2024-02-19 | Disposition: A | Discharge status: Home / Self Care | Payer: Medicare Other | Attending: Emergency Medicine | Admitting: Emergency Medicine

## 2024-02-19 DIAGNOSIS — R519 Headache, unspecified: Secondary | ICD-10-CM | POA: Insufficient documentation

## 2024-02-19 DIAGNOSIS — Z7901 Long term (current) use of anticoagulants: Secondary | ICD-10-CM | POA: Diagnosis not present

## 2024-02-19 LAB — CBC WITH DIFFERENTIAL/PLATELET
Abs Immature Granulocytes: 0.02 10*3/uL (ref 0.00–0.07)
Basophils Absolute: 0 10*3/uL (ref 0.0–0.1)
Basophils Relative: 1 %
Eosinophils Absolute: 0 10*3/uL (ref 0.0–0.5)
Eosinophils Relative: 1 %
HCT: 34.3 % — ABNORMAL LOW (ref 36.0–46.0)
Hemoglobin: 9.9 g/dL — ABNORMAL LOW (ref 12.0–15.0)
Immature Granulocytes: 0 %
Lymphocytes Relative: 23 %
Lymphs Abs: 1.3 10*3/uL (ref 0.7–4.0)
MCH: 22.9 pg — ABNORMAL LOW (ref 26.0–34.0)
MCHC: 28.9 g/dL — ABNORMAL LOW (ref 30.0–36.0)
MCV: 79.2 fL — ABNORMAL LOW (ref 80.0–100.0)
Monocytes Absolute: 0.5 10*3/uL (ref 0.1–1.0)
Monocytes Relative: 9 %
Neutro Abs: 3.9 10*3/uL (ref 1.7–7.7)
Neutrophils Relative %: 66 %
Platelets: 220 10*3/uL (ref 150–400)
RBC: 4.33 MIL/uL (ref 3.87–5.11)
RDW: 18.8 % — ABNORMAL HIGH (ref 11.5–15.5)
WBC: 5.9 10*3/uL (ref 4.0–10.5)
nRBC: 0 % (ref 0.0–0.2)

## 2024-02-19 LAB — COMPREHENSIVE METABOLIC PANEL
ALT: 15 U/L (ref 0–44)
AST: 30 U/L (ref 15–41)
Albumin: 3.5 g/dL (ref 3.5–5.0)
Alkaline Phosphatase: 75 U/L (ref 38–126)
Anion gap: 7 (ref 5–15)
BUN: 24 mg/dL — ABNORMAL HIGH (ref 8–23)
CO2: 18 mmol/L — ABNORMAL LOW (ref 22–32)
Calcium: 9.7 mg/dL (ref 8.9–10.3)
Chloride: 114 mmol/L — ABNORMAL HIGH (ref 98–111)
Creatinine, Ser: 1.48 mg/dL — ABNORMAL HIGH (ref 0.44–1.00)
GFR, Estimated: 34 mL/min — ABNORMAL LOW (ref >60)
Glucose, Bld: 96 mg/dL (ref 70–99)
Potassium: 4.4 mmol/L (ref 3.5–5.1)
Sodium: 139 mmol/L (ref 135–145)
Total Bilirubin: 0.6 mg/dL (ref 0.0–1.2)
Total Protein: 6.5 g/dL (ref 6.5–8.1)

## 2024-02-19 MED ORDER — DIPHENHYDRAMINE HCL 50 MG/ML IJ SOLN
12.5000 mg | Freq: Once | INTRAMUSCULAR | Status: AC
  Administered 2024-02-19: 12.5 mg via INTRAVENOUS
  Filled 2024-02-19: qty 1

## 2024-02-19 MED ORDER — METOCLOPRAMIDE HCL 5 MG/ML IJ SOLN
5.0000 mg | Freq: Once | INTRAMUSCULAR | Status: AC
Start: 2024-02-19 — End: 2024-02-19
  Administered 2024-02-19: 5 mg via INTRAVENOUS
  Filled 2024-02-19: qty 2

## 2024-02-19 NOTE — ED Provider Notes (Signed)
 [place] EMERGENCY DEPARTMENT AT [facility] Provider Note   CSN: [id number] Arrival date & time: 02/19/24  4098     History  Chief Complaint  Patient presents with   Headache    [name] is a 89 y.o. female.  89 yo F with a chief complaints of a right-sided headache.  She feels like this is right behind the right eye.  Going on for about 3 days now.  She has a history of a meningioma and is status post 2 different gamma knife procedures.  She has had trouble with that eye in the past.  Also has trouble with her right hand.  That is also been going on for a while.  She feels like the vision in her right eye is slightly different which is new for her.  She denies trauma.  Denies fevers cough or congestion.   Headache      Home Medications Prior to Admission medications   Medication Sig Start Date End Date Taking? Authorizing Provider  allopurinol (ZYLOPRIM) 100 MG tablet Take 100 mg by mouth daily.    [provider]  Carboxymethylcellul-Glycerin (LUBRICATING EYE DROPS OP) Place 1 drop into both eyes daily as needed (dry eyes).    [provider]  colchicine 0.6 MG tablet Take 1 tablet (0.6 mg total) by mouth daily as needed (gout flare). 12/12/19   Croitoru, Mihai, MD  HYDROcodone-acetaminophen (NORCO/VICODIN) 5-325 MG tablet Take 1 tablet by mouth every 6 (six) hours as needed. 08/22/23   [provider]  KLOR-CON M10 10 MEQ tablet TAKE 1 TABLET BY MOUTH EVERY DAY 05/15/23   Croitoru, Rachelle Hora, MD  nitroGLYCERIN (NITROLINGUAL) 0.4 MG/SPRAY spray Place 3 sprays under the tongue every 5 (five) minutes x 3 doses as needed for chest pain. 05/20/18   Croitoru, Mihai, MD  omeprazole (PRILOSEC) 20 MG capsule Take 20 mg by mouth daily.    [provider]  Polyethyl Glycol-Propyl Glycol (SYSTANE OP) Place 1 drop into both eyes at bedtime as needed (dry eyes).    [provider]  pravastatin (PRAVACHOL) 20 MG tablet Take 20 mg by  mouth at bedtime.    [provider]  topiramate (TOPAMAX) 50 MG tablet TAKE 1 TABLET BY MOUTH TWICE A DAY 12/21/23   Butch Penny, NP  torsemide (DEMADEX) 20 MG tablet TAKE 2 TABLETS (40 MG TOTAL) BY MOUTH DAILY. 07/31/23   Croitoru, Mihai, MD  traMADol (ULTRAM) 50 MG tablet Take 50 mg by mouth at bedtime as needed for moderate pain (sleep). 01/05/20   [provider]  verapamil (VERELAN) 180 MG 24 hr capsule Take 1 capsule (180 mg total) by mouth at bedtime. 08/27/23   Croitoru, Mihai, MD  warfarin (COUMADIN) 4 MG tablet Take 4 mg by mouth See admin instructions. Take 4 mg at night on Mon, Tue, Wed, Fri, and Sat    [provider]  warfarin (COUMADIN) 5 MG tablet Take 5 mg by mouth See admin instructions. Take 5 mg at night on Sun and Thur    [provider]      Allergies    Dilantin [phenytoin sodium extended] and Phenobarbital    Review of Systems   Review of Systems  Neurological:  Positive for headaches.    Physical Exam Updated Vital Signs BP (!) 151/76   Pulse 87   Temp 99.1 F (37.3 C) (Oral)   Resp 18   Ht 5\' 5"  (1.651 m)   Wt 90.3 kg  SpO2 100%   BMI 33.13 kg/m  Physical Exam Vitals and nursing note reviewed.  Constitutional:      General: She is not in acute distress.    Appearance: She is well-developed. She is not diaphoretic.  HENT:     Head: Normocephalic and atraumatic.  Eyes:     Pupils: Pupils are equal, round, and reactive to light.  Cardiovascular:     Rate and Rhythm: Normal rate and regular rhythm.     Heart sounds: No murmur heard.    No friction rub. No gallop.  Pulmonary:     Effort: Pulmonary effort is normal.     Breath sounds: No wheezing or rales.  Abdominal:     General: There is no distension.     Palpations: Abdomen is soft.     Tenderness: There is no abdominal tenderness.  Musculoskeletal:        General: No tenderness.     Cervical back: Normal range of motion and neck supple.  Skin:     General: Skin is warm and dry.  Neurological:     Mental Status: She is alert and oriented to person, place, and time.     Comments: Patient has some trouble with finger-nose which seems to be more due to her visual acuity than anything else.  There is no obvious wavering.  She is perhaps slightly weak in the right upper extremity compared to the left.  Psychiatric:        Behavior: Behavior normal.     ED Results / Procedures / Treatments   Labs (all labs ordered are listed, but only abnormal results are displayed) Labs Reviewed  CBC WITH DIFFERENTIAL/PLATELET - Abnormal; Notable for the following components:      Result Value   Hemoglobin 9.9 (*)    HCT 34.3 (*)    MCV 79.2 (*)    MCH 22.9 (*)    MCHC 28.9 (*)    RDW 18.8 (*)    All other components within normal limits  COMPREHENSIVE METABOLIC PANEL - Abnormal; Notable for the following components:   Chloride 114 (*)    CO2 18 (*)    BUN 24 (*)    Creatinine, Ser 1.48 (*)    GFR, Estimated 34 (*)    All other components within normal limits    EKG None  Radiology CT Head Wo Contrast Result Date: 02/19/2024 CLINICAL DATA:  Headache, new onset.  Symptoms for 3 days. EXAM: CT HEAD WITHOUT CONTRAST TECHNIQUE: Contiguous axial images were obtained from the base of the skull through the vertex without intravenous contrast. RADIATION DOSE REDUCTION: This exam was performed according to the departmental dose-optimization program which includes automated exposure control, adjustment of the mA and/or kV according to patient size and/or use of iterative reconstruction technique. COMPARISON:  09/17/2020 FINDINGS: Brain: No evidence of acute infarction, hemorrhage, hydrocephalus, extra-axial collection or mass lesion/mass effect. Encephalomalacia in the anterior right temporal lobe beneath the craniotomy site. Cerebral volume loss. Vascular: No hyperdense vessel or unexpected calcification. Skull: Stable right-sided craniotomy site. No  acute or aggressive finding. Sinuses/Orbits: Negative IMPRESSION: No acute or interval finding. Electronically Signed   By: Tiburcio Pea M.D.   On: 02/19/2024 10:27    Procedures Procedures    Medications Ordered in ED Medications  metoCLOPramide (REGLAN) injection 5 mg (5 mg Intravenous Given 02/19/24 0922)  diphenhydrAMINE (BENADRYL) injection 12.5 mg (12.5 mg Intravenous Given 02/19/24 6045)    ED Course/ Medical Decision Making/ A&P  Medical Decision Making Amount and/or Complexity of Data Reviewed Labs: ordered. Radiology: ordered.  Risk Prescription drug management.   89 yo F with a chief complaints of a right-sided headache and change in her vision to her right eye.  It sounds like the monocular vision change.  She however has had issues with her right eye in the past with a known meningioma.  She has had gamma knife surgery x 2.  Will obtain a CT scan of the head.  Headache cocktail.  Reassess.   Patient is doing quite a bit better on repeat assessments.  She feels like her visual symptoms have improved and her headache is completely gone.  CT imaging here is negative for obvious acute intracranial hemorrhage or new mass.  No acute anemia, no significant electrolyte abnormalities.  I discussed the case with the neurologist on-call, Dr. Iver Nestle.  She reviewed the patient's charts and imaging given based on my history thought best to follow-up as an outpatient.  10:43 AM:  I have discussed the diagnosis/risks/treatment options with the patient.  Evaluation and diagnostic testing in the emergency department does not suggest an emergent condition requiring admission or immediate intervention beyond what has been performed at this time.  They will follow up with PCP. We also discussed returning to the ED immediately if new or worsening sx occur. We discussed the sx which are most concerning (e.g., sudden worsening pain, fever, inability to tolerate  by mouth) that necessitate immediate return. Medications administered to the patient during their visit and any new prescriptions provided to the patient are listed below.  Medications given during this visit Medications  metoCLOPramide (REGLAN) injection 5 mg (5 mg Intravenous Given 02/19/24 0922)  diphenhydrAMINE (BENADRYL) injection 12.5 mg (12.5 mg Intravenous Given 02/19/24 4540)     The patient appears reasonably screen and/or stabilized for discharge and I doubt any other medical condition or other [facility] requiring further screening, evaluation, or treatment in the ED at this time prior to discharge.           Final Clinical Impression(s) / ED Diagnoses Final diagnoses:  Right-sided headache    Rx / DC Orders ED Discharge Orders          Ordered    Ambulatory referral to Neurology       Comments: Seen [facility] in the past, missed recent appointment, recurrent R sided headaches seen in ED.   02/19/24 1041              Melene Plan, DO 02/19/24 1043

## 2024-02-19 NOTE — ED Triage Notes (Signed)
 Patient is here for evaluation of headache X 3 days. Reports she has had 2 brain surgeries in the past. Pt states that she feels like "there is a film over my right eye." Reports some pain behind her right eye.

## 2024-02-19 NOTE — Discharge Instructions (Signed)
 I discussed the case with the neurologist on-call.  She thought it best to follow-up with your neurologist back in the office.  I did place an order for them to call you on the phone but you could also try to call them on your own and see when they can get you into the office.  I would also have you call your ophthalmologist and let them know that you had some right eye symptoms and see when they want to see you next.

## 2024-02-22 NOTE — Telephone Encounter (Signed)
 Late entry: I tried to call the patient 02/18/24 at 6:00. Keep saying call can't be completed as dialed.

## 2024-02-24 NOTE — Telephone Encounter (Signed)
 Received new referral from ED for headaches/vision changes. Called patient and got her set up for this first available appointment I was able to find on 5/12 at 1:30PM with Dr. Lucia Gaskins. Patient stated she cannot wait until May to see [name], her heaches are so bad and she is having them everyday, it feels like they did before she had brain surgery and she is concerned. They did a CT scan at ED which was showed FINDINGS: Brain: No evidence of acute infarction, hemorrhage, hydrocephalus, extra-axial collection or mass lesion/mass effect. Encephalomalacia in the anterior right temporal lobe beneath the craniotomy site. Cerebral volume loss  No acute findings.  Please review and advise, patient wanted to make sure [name] looked at her chart and stated she is sure she would want to get her in sooner. I advised I would send a message to have our clinical staff review ED notes and advise on how to proceed as I have no sooner appointments available at this time, she is also on wait list. Please review and advise, thank you

## 2024-02-29 NOTE — Telephone Encounter (Signed)
 Patient needs to establish with MD here. Per ED note- her symptoms improved before discharge. CT head was negative.  Can we find her an appointment with an MD sooner than May.

## 2024-02-29 NOTE — Telephone Encounter (Signed)
 I spoke with the patient and offered sooner appt with Dr Frances Furbish or Dr Vickey Huger for this week (tues, wed). Patient was unable to accept due to prior appts made with other doctors but she was appreciative and asked that I keep her on the wait list. She said right now the medication she was given is helping. She said she has tramadol to take prn and her pcp put her on a prednisone taper. She thanked me for the call.   Pt on wait list for multiple MDs.

## 2024-03-02 ENCOUNTER — Ambulatory Visit: Payer: Medicare Other | Admitting: Cardiovascular Disease

## 2024-03-03 ENCOUNTER — Encounter: Payer: Self-pay | Admitting: Cardiovascular Disease

## 2024-03-03 ENCOUNTER — Ambulatory Visit: Payer: Medicare Other | Attending: Cardiovascular Disease | Admitting: Cardiovascular Disease

## 2024-03-03 VITALS — BP 132/68 | HR 71 | Ht 65.0 in | Wt 216.4 lb

## 2024-03-03 DIAGNOSIS — I10 Essential (primary) hypertension: Secondary | ICD-10-CM | POA: Diagnosis not present

## 2024-03-03 DIAGNOSIS — I5032 Chronic diastolic (congestive) heart failure: Secondary | ICD-10-CM | POA: Diagnosis not present

## 2024-03-03 DIAGNOSIS — I480 Paroxysmal atrial fibrillation: Secondary | ICD-10-CM

## 2024-03-03 DIAGNOSIS — N1832 Chronic kidney disease, stage 3b: Secondary | ICD-10-CM

## 2024-03-03 DIAGNOSIS — I495 Sick sinus syndrome: Secondary | ICD-10-CM

## 2024-03-03 DIAGNOSIS — I2510 Atherosclerotic heart disease of native coronary artery without angina pectoris: Secondary | ICD-10-CM

## 2024-03-03 DIAGNOSIS — E7800 Pure hypercholesterolemia, unspecified: Secondary | ICD-10-CM

## 2024-03-03 DIAGNOSIS — D6869 Other thrombophilia: Secondary | ICD-10-CM

## 2024-03-03 NOTE — Patient Instructions (Addendum)
 Medication Instructions:  No Changes *If you need a refill on your cardiac medications before your next appointment, please call your pharmacy*   Lab Work: No Labs If you have labs (blood work) drawn today and your tests are completely normal, you will receive your results only by: MyChart Message (if you have MyChart) OR A paper copy in the mail If you have any lab test that is abnormal or we need to change your treatment, we will call you to review the results.   Testing/Procedures:[address], [address] ( June 2025). Your physician has requested that you have an echocardiogram. Echocardiography is a painless test that uses sound waves to create images of your heart. It provides your doctor with information about the size and shape of your heart and how well your heart's chambers and valves are working. This procedure takes approximately one hour. There are no restrictions for this procedure. Please do NOT wear cologne, perfume, aftershave, or lotions (deodorant is allowed). Please arrive 15 minutes prior to your appointment time.  Please note: We ask at that you not bring children with you during ultrasound (echo/ vascular) testing. Due to room size and safety concerns, children are not allowed in the ultrasound rooms during exams. Our front office staff cannot provide observation of children in our lobby area while testing is being conducted. An adult accompanying a patient to their appointment will only be allowed in the ultrasound room at the discretion of the ultrasound technician under special circumstances. We apologize for any inconvenience.    Follow-Up: At [facility], you and your health needs are our priority.  As part of our continuing mission to provide you with exceptional heart care, we have created designated Provider Care Teams.  These Care Teams include your primary Cardiologist (physician) and Advanced Practice Providers (APPs -  Physician Assistants  and Nurse Practitioners) who all work together to provide you with the care you need, when you need it.  We recommend signing up for the patient portal called "MyChart".  Sign up information is provided on this After Visit Summary.  MyChart is used to connect with patients for Virtual Visits (Telemedicine).  Patients are able to view lab/test results, encounter notes, upcoming appointments, etc.  Non-urgent messages can be sent to your provider as well.   To learn more about what you can do with MyChart, go to [email or web address].    Your next appointment:   1 year(s)  Provider:   Thurmon Fair, MD    Other Instructions   1st Floor: - Lobby - Registration  - Pharmacy  - Lab - Cafe  2nd Floor: - PV Lab - Diagnostic Testing (echo, CT, nuclear med)  3rd Floor: - Vacant  4th Floor: - TCTS (cardiothoracic surgery) - AFib Clinic - Structural Heart Clinic - Vascular Surgery  - Vascular Ultrasound  5th Floor: - [facility] (general and EP) - Clinical Pharmacy for coumadin, hypertension, lipid, weight-loss medications, and med management appointments    Valet parking services will be available as well.

## 2024-03-03 NOTE — Progress Notes (Addendum)
 Cardiology Office Note    Date:  03/03/2024   ID:  Dayjah, Selman September 18, 1932, MRN [id number]  PCP:  Andi Devon, MD  Cardiologist:   Thurmon Fair, MD   Chief Complaint  Patient presents with   Atrial Fibrillation   Congestive Heart Failure   Pacemaker Check    History of Present Illness:  [name] is a 89 y.o. female here for follow-up of her dual-chamber permanent pacemaker. The device was implanted in 2010 for tachycardia-bradycardia syndrome (paroxysmal atrial tachycardia, paroxysmal atrial fibrillation, symptomatic sinus bradycardia) and she underwent a generator change out in September 2020.  She has a history of hypertension, chronic diastolic heart failure, gout, hypercholesterolemia.  She has a history of meningioma and to previous gamma knife treatments, with a history of subsequent seizures that have been well-controlled on Topamax.  Anticoagulation is with warfarin, followed by her primary care provider.  She has become even more sedentary.  She is no longer using her stationary bicycle but has some type of a pedaling device that she uses while sitting in a chair.  She is having a lot of problems with discomfort in her right hip and right knee.  She was in the emergency room in late February with a severe headache.  CT of the head did not show any evidence of recurrent meningioma, intracranial hemorrhage, stroke or other abnormalities.  The headache has improved but of mild discomfort persists and she complains of difficulty seeing out of the right eye and seems to be blinking a lot from at night.  She has a follow-up appointment with her neurologist at [facility] neurology as well as an appointment with her ophthalmologist scheduled.  She occasionally needs to use a diuretic when she develops edema, roughly once a month.  She did not tolerate SGLT2 inhibitors due to urinary tract infections.  She denies chest pain or shortness of breath at rest.  She is  never aware of palpitations when she has atrial fibrillation (including today when the presenting rhythm was atrial fibrillation with occasional ventricular pacing).  She has not had any falls or bleeding problems.  She denies other neurological symptoms except as described above.  For pacemaker generator is a Abbott Assurity MRI conditional device and both the atrial and ventricular leads are [facility] 2088 TC tendril leads that are MRI conditional.  The whole system is compatible with MRI with appropriate device reprogramming.  Pacemaker interrogation shows normal device function with estimated generator longevity of just over 5 years.  The recent burden of atrial fibrillation has been similar to past trends at around 15-20%.  She has not had any problems with rapid ventricular rates or ventricular tachycardia.  Activity level is very low and her heart rate histogram distribution is appropriate for it.  Many of her episodes of atrial fibrillation are very brief, but she had a 2-1/2-day episode that occurred in early February and is currently 10 hours into an episode of atrial fibrillation.   Her echocardiogram performed last October 2019 was interpreted as normal diastolic function.  On my review however the diastolic annulus velocities are actually decreased and the E/e' was 13-14, signifying probable volume overload.  She has preserved left ventricular systolic function.  She has never had embolic events that we are aware of. She has normal left ventricular systolic function by previous echo, normal nuclear stress testing, had minimal CAD on angiography 2011. She does have a history of seizures associated with resection of multiple meningiomas in  the past.  No recurrence of seizures while on treatment with Topamax   Past Medical History:  Diagnosis Date   ACL (anterior cruciate ligament) rupture    Left   Acute MI (HCC)    Arthritis    Back pain    Secondary to arthritis   Benign  paroxysmal positional vertigo 03/18/2016   Brain tumor [facility])    On right side. Removed in 1989   Bruit    Duplex doppler 03/28/05 Mildly abnormal. *Right subclavian artery: Less than 50% diameter reduction.   Chest pain    2D Echo 04/21/08 EF = >55%. Moderate tricuspid regurgitation. Persantine Myovoew stress test 04/21/08 EF = 81%, no evidence of ischemia noted.   Chronic anticoagulation    PAF   Chronic edema    Mild LE edema   Chronic kidney disease    Mild, stage 2   Coronary artery disease    Coronary atherosclerosis    Minimal   Dyslipidemia    GERD (gastroesophageal reflux disease)    Hyperlipidemia    On pravastatin   Memory difficulty 07/13/2014   Meningioma (HCC)    Resection in 1998   Mild depression    Ovarian cyst    Benign. Had a salpiingoophorectomy in 2005.   PAF (paroxysmal atrial fibrillation) (HCC)    On coumadin. St. Jude PPM (serial [id number]) inserted 2010   Paroxysmal atrial flutter (HCC)    Seizure (HCC) 2006   Severe obesity (HCC)    Sick sinus syndrome (HCC)    Sinus node dysfunction (HCC)    S/P implantation of a dual-chamber permanent pacemaker in 2010   SOB (shortness of breath) on exertion    Class II. Catheterization 04/30/10 showed mild CAD with mildly elevated right heart pressures.    Tachycardia-bradycardia syndrome (HCC)    S/P implantation of dual-chamber permanent pacemaker in 2010   Trochlear nerve palsy    Right   Vertigo     Past Surgical History:  Procedure Laterality Date   Brain tumor removal  1989   CARDIAC CATHETERIZATION  04/30/10   Showed mild CAD with mildly elevated right heart pressures. (Dr. Lynnea Ferrier)   CRANIOTOMY  1989   (Brain tumor) With gamma knife procedure as well.   KNEE SURGERY     Bilateral, arthroscopic   PACEMAKER INSERTION  04/27/09   Implanted by Dr. Lynnea Ferrier. St. Jude Accent DR (serial [id number])   PPM GENERATOR CHANGEOUT N/A 09/12/2019   Procedure: PPM GENERATOR CHANGEOUT;  Surgeon: Thurmon Fair, MD;   Location: [facility] INVASIVE CV LAB;  Service: Cardiovascular;  Laterality: N/A;    Current Medications: Outpatient Medications Prior to Visit  Medication Sig Dispense Refill   allopurinol (ZYLOPRIM) 100 MG tablet Take 100 mg by mouth daily.     Carboxymethylcellul-Glycerin (LUBRICATING EYE DROPS OP) Place 1 drop into both eyes daily as needed (dry eyes).     HYDROcodone-acetaminophen (NORCO/VICODIN) 5-325 MG tablet Take 1 tablet by mouth every 6 (six) hours as needed.     KLOR-CON M10 10 MEQ tablet TAKE 1 TABLET BY MOUTH EVERY DAY 90 tablet 3   metoprolol succinate (TOPROL-XL) 25 MG 24 hr tablet Take 25 mg by mouth daily.     nitroGLYCERIN (NITROLINGUAL) 0.4 MG/SPRAY spray Place 3 sprays under the tongue every 5 (five) minutes x 3 doses as needed for chest pain. 12 g 3   omeprazole (PRILOSEC) 20 MG capsule Take 20 mg by mouth daily.     Polyethyl Glycol-Propyl Glycol (SYSTANE OP) Place 1 drop  into both eyes at bedtime as needed (dry eyes).     pravastatin (PRAVACHOL) 20 MG tablet Take 20 mg by mouth at bedtime.     predniSONE (DELTASONE) 5 MG tablet Take 5 mg by mouth daily at 6 (six) AM.     topiramate (TOPAMAX) 50 MG tablet TAKE 1 TABLET BY MOUTH TWICE A DAY 180 tablet 1   torsemide (DEMADEX) 20 MG tablet TAKE 2 TABLETS (40 MG TOTAL) BY MOUTH DAILY. 180 tablet 2   traMADol (ULTRAM) 50 MG tablet Take 50 mg by mouth at bedtime as needed for moderate pain (sleep).     verapamil (VERELAN) 180 MG 24 hr capsule Take 1 capsule (180 mg total) by mouth at bedtime. 90 capsule 3   warfarin (COUMADIN) 4 MG tablet Take 4 mg by mouth See admin instructions. Take 4 mg at night on Mon, Tue, Wed, Fri, and Sat     warfarin (COUMADIN) 4 MG tablet Take 1 tablet by mouth daily.     warfarin (COUMADIN) 5 MG tablet Take 5 mg by mouth See admin instructions. Take 5 mg at night on Sun and Thur     colchicine 0.6 MG tablet Take 1 tablet (0.6 mg total) by mouth daily as needed (gout flare). (Patient not taking: Reported on  03/03/2024) 20 tablet 0   No facility-administered medications prior to visit.     Allergies:   Dilantin [phenytoin sodium extended] and Phenobarbital     Family History:  The patient's family history includes Heart disease in her brother, brother, brother, father, sister, sister, and sister; Stroke in her mother.   PHYSICAL EXAM:   VS:  BP 132/68 (BP Location: Left Arm, Patient Position: Sitting, Cuff Size: Large)   Pulse 71   Ht 5\' 5"  (1.651 m)   Wt 216 lb 6.4 oz (98.2 kg)   SpO2 99%   BMI 36.01 kg/m      General: Alert, oriented x3, no distress, healthy pacemaker site Head: no evidence of trauma, PERRL, EOMI, no exophtalmos or lid lag, no myxedema, no xanthelasma; normal ears, nose and oropharynx Neck: normal jugular venous pulsations and no hepatojugular reflux; brisk carotid pulses without delay and no carotid bruits Chest: clear to auscultation, no signs of consolidation by percussion or palpation, normal fremitus, symmetrical and full respiratory excursions Cardiovascular: normal position and quality of the apical impulse, irregular rhythm, normal first and second heart sounds, no murmurs, rubs or gallops Abdomen: no tenderness or distention, no masses by palpation, no abnormal pulsatility or arterial bruits, normal bowel sounds, no hepatosplenomegaly Extremities: no clubbing, cyanosis or edema; 2+ radial, ulnar and brachial pulses bilaterally; 2+ right femoral, posterior tibial and dorsalis pedis pulses; 2+ left femoral, posterior tibial and dorsalis pedis pulses; no subclavian or femoral bruits Neurological: grossly nonfocal Psych: Normal mood and affect      Wt Readings from Last 3 Encounters:  03/03/24 216 lb 6.4 oz (98.2 kg)  02/19/24 199 lb 1.2 oz (90.3 kg)  08/27/23 199 lb (90.3 kg)      Studies/Labs Reviewed:   ECHO 11/25/2018  - Left ventricle: The cavity size was mildly reduced. There was   mild focal basal hypertrophy of the septum. Systolic function  was   normal. The estimated ejection fraction was in the range of 50%   to 55%. Wall motion was normal; there were no regional wall   motion abnormalities. The study is indeterminate for the   evaluation of LV diastolic function. - Aortic valve: Trileaflet; mildly thickened,  mildly calcified   leaflets. Sclerosis without stenosis. - Mitral valve: There was mild regurgitation. - Tricuspid valve: There was trivial regurgitation. - Pulmonary arteries: Systolic pressure was within the normal   range.   Impressions:   - EF 53% by 3D imaging, which agrees with visual and 2D estimates.   GLS mildly abnormal at -16.2%. No significant wall motion   abnormalities.    EKG:   EKG Interpretation Date/Time:  Thursday March 03 2024 08:06:54 EDT Ventricular Rate:  71 PR Interval:    QRS Duration:  68 QT Interval:  352 QTC Calculation: 382 R Axis:   16  Text Interpretation: Atrial fibrillation with frequent ventricular-paced complexes When compared with ECG of 27-Aug-2023 08:37, Electronic ventricular pacemaker has replaced Electronic atrial pacemaker Confirmed by Taequan Stockhausen [phone]) on 03/03/2024 8:10:42 AM         Recent Labs: 02/19/2024: ALT 15; BUN 24; Creatinine, Ser 1.48; Hemoglobin 9.9; Platelets 220; Potassium 4.4; Sodium 139   Lipid Panel  No results found for: "CHOL", "TRIG", "HDL", "CHOLHDL", "VLDL", "LDLCALC", "LDLDIRECT", "LABVLDL" Followed by Dr. Renae Gloss   ASSESSMENT:    1. Essential hypertension   2. Chronic diastolic congestive heart failure (HCC)   3. SSS (sick sinus syndrome) (HCC)   4. Paroxysmal atrial fibrillation (HCC)   5. Acquired thrombophilia (HCC)   6. Coronary artery disease involving native coronary artery of native heart without angina pectoris   7. Severe obesity (BMI 35.0-39.9) with comorbidity (HCC)   8. Pure hypercholesterolemia   9. Stage 3b chronic kidney disease (HCC)        PLAN:  In order of problems listed above:  CHF: Uses  diuretics occasionally, we have not had a recent assessment of LV function.  EF was borderline at 50-55% in 2019.  She is now much more sedentary.  Reevaluate LV function.  Did not tolerate SGLT2 inhibitors. SSS: Roughly 70% atrial pacing with fair heart rate histogram considering her level of activity. Pacemaker: Her pacemaker system is MRI conditional.  She should not need any MRIs of the brain or other types of MRI, these can be safely performed with appropriate device programming. Afib: Rate controlled and on full anticoagulation with warfarin.  She is not aware of the arrhythmia today and has never really bothered her in the past.  The burden of atrial fibrillation remains relatively high in the 15-20% range, but has not really changed over the last year. Anticoagulation: Warfarin anticoagulation managed by PCP.  She has not had any bleeding issues. HTN: We reduced her dose of verapamil at her last appointment due to her low diastolic blood pressures.  Better today. CAD: Asymptomatic CAD, nonobstructive lesions by previous cardiac catheterization. Obesity: I am not sure that today's weight is accurate.  Remains in the obese range. HLP: Questing labs from Dr. Renae Gloss. CKD 3: Creatinine is at baseline around 1.5. Deconditioning: I am worried that her legs are becoming much weaker since she is so inactive.  Encouraged her to use her pedaling device and walk with support and assistance.  Will get her a walker.    Medication Adjustments/Labs and Tests Ordered: Current medicines are reviewed at length with the patient today.  Concerns regarding medicines are outlined above.  Medication changes, Labs and Tests ordered today are listed in the Patient Instructions below. Patient Instructions  Medication Instructions:  No Changes *If you need a refill on your cardiac medications before your next appointment, please call your pharmacy*   Lab Work: No Labs If you  have labs (blood work) drawn today  and your tests are completely normal, you will receive your results only by: MyChart Message (if you have MyChart) OR A paper copy in the mail If you have any lab test that is abnormal or we need to change your treatment, we will call you to review the results.   Testing/Procedures:[address], [address] ( June 2025). Your physician has requested that you have an echocardiogram. Echocardiography is a painless test that uses sound waves to create images of your heart. It provides your doctor with information about the size and shape of your heart and how well your heart's chambers and valves are working. This procedure takes approximately one hour. There are no restrictions for this procedure. Please do NOT wear cologne, perfume, aftershave, or lotions (deodorant is allowed). Please arrive 15 minutes prior to your appointment time.  Please note: We ask at that you not bring children with you during ultrasound (echo/ vascular) testing. Due to room size and safety concerns, children are not allowed in the ultrasound rooms during exams. Our front office staff cannot provide observation of children in our lobby area while testing is being conducted. An adult accompanying a patient to their appointment will only be allowed in the ultrasound room at the discretion of the ultrasound technician under special circumstances. We apologize for any inconvenience.    Follow-Up: At [facility], you and your health needs are our priority.  As part of our continuing mission to provide you with exceptional heart care, we have created designated Provider Care Teams.  These Care Teams include your primary Cardiologist (physician) and Advanced Practice Providers (APPs -  Physician Assistants and Nurse Practitioners) who all work together to provide you with the care you need, when you need it.  We recommend signing up for the patient portal called "MyChart".  Sign up information is provided on this  After Visit Summary.  MyChart is used to connect with patients for Virtual Visits (Telemedicine).  Patients are able to view lab/test results, encounter notes, upcoming appointments, etc.  Non-urgent messages can be sent to your provider as well.   To learn more about what you can do with MyChart, go to [email or web address].    Your next appointment:   1 year(s)  Provider:   Thurmon Fair, MD    Other Instructions   1st Floor: - Lobby - Registration  - Pharmacy  - Lab - Cafe  2nd Floor: - PV Lab - Diagnostic Testing (echo, CT, nuclear med)  3rd Floor: - Vacant  4th Floor: - TCTS (cardiothoracic surgery) - AFib Clinic - Structural Heart Clinic - Vascular Surgery  - Vascular Ultrasound  5th Floor: - HeartCare Cardiology (general and EP) - Clinical Pharmacy for coumadin, hypertension, lipid, weight-loss medications, and med management appointments    Valet parking services will be available as well.           Signed, Thurmon Fair, MD  03/03/2024 8:58 AM    [facility] [address] [place], [place], [place]  [address] Phone: [phone]; Fax: [phone]: There is

## 2024-03-07 ENCOUNTER — Ambulatory Visit: Payer: Medicare Other

## 2024-03-07 DIAGNOSIS — I495 Sick sinus syndrome: Secondary | ICD-10-CM

## 2024-03-08 LAB — CUP PACEART REMOTE DEVICE CHECK
Battery Remaining Longevity: 64 mo
Battery Remaining Percentage: 58 %
Battery Voltage: 2.99 V
Brady Statistic AP VP Percent: 1 %
Brady Statistic AP VS Percent: 94 %
Brady Statistic AS VP Percent: 1 %
Brady Statistic AS VS Percent: 5.5 %
Brady Statistic RA Percent Paced: 72 %
Brady Statistic RV Percent Paced: 11 %
Date Time Interrogation Session: 20250317020012
Implantable Lead Connection Status: 753985
Implantable Lead Connection Status: 753985
Implantable Lead Implant Date: 20100507
Implantable Lead Implant Date: 20100507
Implantable Lead Location: 753859
Implantable Lead Location: 753860
Implantable Pulse Generator Implant Date: 20200921
Lead Channel Impedance Value: 340 Ohm
Lead Channel Impedance Value: 490 Ohm
Lead Channel Pacing Threshold Amplitude: 0.625 V
Lead Channel Pacing Threshold Amplitude: 1 V
Lead Channel Pacing Threshold Pulse Width: 0.5 ms
Lead Channel Pacing Threshold Pulse Width: 0.5 ms
Lead Channel Sensing Intrinsic Amplitude: 1 mV
Lead Channel Sensing Intrinsic Amplitude: 12 mV
Lead Channel Setting Pacing Amplitude: 1.625
Lead Channel Setting Pacing Amplitude: 2.5 V
Lead Channel Setting Pacing Pulse Width: 0.5 ms
Lead Channel Setting Sensing Sensitivity: 4 mV
Pulse Gen Model: 2272
Pulse Gen Serial Number: 9166074

## 2024-04-12 ENCOUNTER — Other Ambulatory Visit: Payer: Self-pay | Admitting: Cardiovascular Disease

## 2024-04-21 NOTE — Addendum Note (Signed)
 Addended by: Edra Govern D on: 04/21/2024 03:55 PM   Modules accepted: Orders

## 2024-04-21 NOTE — Progress Notes (Signed)
 Remote pacemaker transmission.

## 2024-04-29 ENCOUNTER — Other Ambulatory Visit: Payer: Self-pay | Admitting: Cardiovascular Disease

## 2024-04-29 MED ORDER — VERAPAMIL HCL ER 180 MG PO CP24
180.0000 mg | ORAL_CAPSULE | Freq: Every day | ORAL | 3 refills | Status: AC
Start: 2024-04-29 — End: ?

## 2024-05-02 ENCOUNTER — Encounter: Payer: Self-pay | Admitting: Neurology

## 2024-05-02 ENCOUNTER — Ambulatory Visit: Admitting: Neurology

## 2024-05-02 VITALS — BP 133/79 | HR 88 | Ht 67.0 in | Wt 214.0 lb

## 2024-05-02 DIAGNOSIS — R519 Headache, unspecified: Secondary | ICD-10-CM

## 2024-05-02 NOTE — Progress Notes (Unsigned)
 PATIENT: [name] DOB: July 31, 1932  REASON FOR VISIT: follow up HISTORY FROM: patient PRIMARY NEUROLOGIST: Dr. Tilda Fogo  Chief Complaint  Patient presents with   Room 18/Vision Changes    Pt is here with her Daughter. Pt states that she has pain in her right eye.Pt states that she has a dull pain in her right eye and she thinks it is effecting her right ear. Pt states that the eye pain gives her headaches. Pt states that she hears like a bursting sound in her right ear that started a couple of months ago. Pt has a history of Brain Surgery 20-25 years ago. Pt states that she is having memory issues with recall.      HISTORY OF PRESENT ILLNESS: Today 05/02/2024; Patient was seen in the past for meningiomas and seizures by my colleague Dr. Tilda Fogo.Here for right-sided headache. She has a history of meningioma and to previous gamma knife treatments, with a history of subsequent seizures that have been well-controlled on Topamax . Anticoagulation is with warfarin, followed by her primary care provider.   has Paroxysmal atrial fibrillation (HCC); Long term current use of anticoagulant therapy; Essential hypertension; CKD (chronic kidney disease) stage 3, GFR 30-59 ml/min (HCC); Seizure (HCC); Obesity (BMI 30.0-34.9); Pacemaker - dual chamber St. Jude RF device, implanted May 2010; Angina, class III (HCC); CAD (coronary artery disease); Gout flare of right elbow; Memory difficulty; Exertional shortness of breath; Meningioma, multiple (HCC); Benign paroxysmal positional vertigo; SSS (sick sinus syndrome) (HCC); History of anticoagulant therapy; Abnormal C-reactive protein; Blood glucose abnormal; Gastroesophageal reflux disease without esophagitis; Gouty arthropathy; History of cerebral meningioma; Hyperlipidemia; Shoulder pain; Vitamin D deficiency; Chronic diastolic heart failure (HCC); Pacemaker battery depletion; Headache; Blood clotting disorder (HCC); and Severe obesity (BMI 35.0-39.9) with  comorbidity (HCC) on their problem list.  She was seen in the ED 01/2024 with headache behind the right eye. Improved with a migraine cocktail. CT head was stable.She saw cardiology Dr. Alvis Ba and still c/o mild discomfort right side of head and was supposed to see ophthalmology for continued vision changes right eye. She has headaches behind the right eye. A pain dull ache behind the right eye. All the time. Right behind the right eye and it can affect the ear, her face and her eye just about every day. A tylenol  helps her sleep. She can feel it all the time. Tylenol  helps. Here with her daughter who provides much information. She also has recently been taking tramadol  for arthritis pain in the knee, back and shoulders.  When the pain starts, it "snowballs" but the tramadol  was giving her hallucinations, mood changes of crying and pain. The ophthalmologist told her to wear prisms a year ago, they have not recently seen the ophthalmologist. They have been struggling with vision changes for a while but worse in the right eye, unclear if there are worse vision changes in the right eye or just pain or both. Pain is around the eye, not shooting more dull and continuous. No light sensitivity. No sound sensitivity. No hx of migraine. 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   RADIATION DOSE REDUCTION: This exam was performed according to the departmental dose-optimization  program which includes automated exposure control, adjustment of the mA and/or kV according to patient size and/or use of iterative reconstruction technique.   COMPARISON:  09/17/2020   FINDINGS: Brain: No  evidence of acute infarction, hemorrhage, hydrocephalus, extra-axial collection or mass lesion/mass effect. Encephalomalacia in the anterior right temporal lobe beneath the craniotomy site. Cerebral volume loss.   Vascular: No hyperdense vessel or unexpected calcification.   Skull: Stable right-sided craniotomy site. No acute or aggressive finding.   Sinuses/Orbits: Negative   IMPRESSION: No acute or interval finding.   12/25/2022: [name] is a 89 y.o. female with a history of meningioma and seizures. Returns today for follow-up.  Patient reports that she has not had any seizure events.  Remains on Topamax  50 mg twice a day. Daughter lives with her. Able to complete most ADLS independently. Daughter helps with her bath. She manages her own medications.  Patient reports that she still has trouble with her vision in the right eye.  She is seeing her ophthalmology but unless she has surgery she reports that there is nothing else they can do.     12/24/21: [name] is an 89 year old female with a history of meningioma and seizures.  She returns today for follow-up.  She reports that she has not had any seizure events.  Reports that her headaches are under good control.  She remains on Topamax  50 mg twice a day.  Reports that her memory is stable.  She states that her double vision is getting worse.  She states that she has an ophthalmologist that she follows with.  Her daughter is with her today.  Daughter states that ophthalmology has identified the problem with her vision however surgery is not an option for the patient due to her being on Coumadin .  I have not reviewed any notes from ophthalmology.  The patient's last CT scan of the head was in 2021 and stable.   08/27/21: [name] is an 89 year old female with a history of multiple meningioma and seizures.  She returns today for follow-up.  She is currently taking Topamax  50 mg twice a day.  At the last visit she was reporting some  cognitive changes and Dr. Tilda Fogo suggested changing her to long-acting Topamax  or to Zonegran or Lamictal.  The patient did not want to change medication.  The patient's daughter currently lives with her.  She does not operate a motor vehicle.  The patient is mainly concerned that if she switches medication she could have a breakthrough seizure.  The daughter that is with her today does not notice her memory issues.  Patient reports headaches have remained stable  HISTORY (copied from Dr. Gisele Lamas note)  [name] is an 89 year old right-handed black female with a history of multiple meningioma, she has a history of seizures associated with this.  She has done well with Topamax  taking 50 mg twice daily, but she does report some difficulty with memory and concentration.  The patient has had chronic issues with double vision, she has seen ophthalmology for prisms.  She does report some occasional headaches in the right frontal area that may come on once or twice a year, they stay for about 5 days then go away.  The patient has not had any recent seizures.  She has difficulty walking, she has had bilateral total hip replacements and she walks with a cane or a walker.  She has not had any recent falls.  She lives with her daughter.  She has low  back pain is worse when she is stooped.  She does note occasional neck stiffness.  With the headaches, she denies any nausea or vomiting or photophobia or phonophobia.  She may take Ultram  or Tylenol  for the headache.  The patient does not operate a motor vehicle, she has a decrease in vision associated with macular degeneration.   REVIEW OF SYSTEMS: Out of a complete 14 system review of symptoms, the patient complains only of the following symptoms, and all other reviewed systems are negative.  ALLERGIES: Allergies  Allergen Reactions   Dilantin [Phenytoin Sodium Extended] Hives   Phenobarbital Hives          HOME MEDICATIONS: Outpatient Medications Prior to  Visit  Medication Sig Dispense Refill   allopurinol (ZYLOPRIM) 100 MG tablet Take 100 mg by mouth daily.     Carboxymethylcellul-Glycerin (LUBRICATING EYE DROPS OP) Place 1 drop into both eyes daily as needed (dry eyes).     metoprolol  succinate (TOPROL -XL) 25 MG 24 hr tablet Take 25 mg by mouth daily.     omeprazole (PRILOSEC) 20 MG capsule Take 20 mg by mouth daily.     potassium chloride  (KLOR-CON  M) 10 MEQ tablet TAKE 1 TABLET BY MOUTH EVERY DAY 90 tablet 3   pravastatin (PRAVACHOL) 20 MG tablet Take 20 mg by mouth at bedtime.     topiramate  (TOPAMAX ) 50 MG tablet TAKE 1 TABLET BY MOUTH TWICE A DAY 180 tablet 1   torsemide  (DEMADEX ) 20 MG tablet TAKE 2 TABLETS (40 MG TOTAL) BY MOUTH DAILY. 180 tablet 2   verapamil  (VERELAN ) 180 MG 24 hr capsule Take 1 capsule (180 mg total) by mouth at bedtime. 90 capsule 3   warfarin (COUMADIN ) 4 MG tablet Take 4 mg by mouth See admin instructions. Take 4 mg at night on Mon, Tue, Wed, Fri, and Sat     warfarin (COUMADIN ) 5 MG tablet Take 5 mg by mouth See admin instructions. Take 5 mg at night on Sun and Thur     colchicine  0.6 MG tablet Take 1 tablet (0.6 mg total) by mouth daily as needed (gout flare). (Patient not taking: Reported on 03/03/2024) 20 tablet 0   HYDROcodone-acetaminophen  (NORCO/VICODIN) 5-325 MG tablet Take 1 tablet by mouth every 6 (six) hours as needed. (Patient not taking: Reported on 05/02/2024)     nitroGLYCERIN  (NITROLINGUAL ) 0.4 MG/SPRAY spray Place 3 sprays under the tongue every 5 (five) minutes x 3 doses as needed for chest pain. (Patient not taking: Reported on 05/02/2024) 12 g 3   Polyethyl Glycol-Propyl Glycol (SYSTANE OP) Place 1 drop into both eyes at bedtime as needed (dry eyes). (Patient not taking: Reported on 05/02/2024)     predniSONE (DELTASONE) 5 MG tablet Take 5 mg by mouth daily at 6 (six) AM. (Patient not taking: Reported on 05/02/2024)     traMADol  (ULTRAM ) 50 MG tablet Take 50 mg by mouth at bedtime as needed for moderate  pain (sleep). (Patient not taking: Reported on 05/02/2024)     warfarin (COUMADIN ) 4 MG tablet Take 1 tablet by mouth daily. (Patient not taking: Reported on 05/02/2024)     No facility-administered medications prior to visit.    PAST MEDICAL HISTORY: Past Medical History:  Diagnosis Date   ACL (anterior cruciate ligament) rupture    Left   Acute MI (HCC)    Arthritis    Back pain    Secondary to arthritis   Benign paroxysmal positional vertigo 03/18/2016   Brain tumor (HCC)    On  right side. Removed in 1989   Bruit    Duplex doppler 03/28/05 Mildly abnormal. *Right subclavian artery: Less than 50% diameter reduction.   Chest pain    2D Echo 04/21/08 EF = >55%. Moderate tricuspid regurgitation. Persantine Myovoew stress test 04/21/08 EF = 81%, no evidence of ischemia noted.   Chronic anticoagulation    PAF   Chronic edema    Mild LE edema   Chronic kidney disease    Mild, stage 2   Coronary artery disease    Coronary atherosclerosis    Minimal   Dyslipidemia    GERD (gastroesophageal reflux disease)    Hyperlipidemia    On pravastatin   Memory difficulty 07/13/2014   Meningioma (HCC)    Resection in 1998   Mild depression    Ovarian cyst    Benign. Had a salpiingoophorectomy in 2005.   PAF (paroxysmal atrial fibrillation) (HCC)    On coumadin . St. Jude PPM (serial [id number]) inserted 2010   Paroxysmal atrial flutter (HCC)    Seizure (HCC) 2006   Severe obesity (HCC)    Sick sinus syndrome (HCC)    Sinus node dysfunction (HCC)    S/P implantation of a dual-chamber permanent pacemaker in 2010   SOB (shortness of breath) on exertion    Class II. Catheterization 04/30/10 showed mild CAD with mildly elevated right heart pressures.    Tachycardia-bradycardia syndrome (HCC)    S/P implantation of dual-chamber permanent pacemaker in 2010   Trochlear nerve palsy    Right   Vertigo     PAST SURGICAL HISTORY: Past Surgical History:  Procedure Laterality Date   Brain tumor  removal  1989   CARDIAC CATHETERIZATION  04/30/10   Showed mild CAD with mildly elevated right heart pressures. (Dr. Johnney Nam)   CRANIOTOMY  1989   (Brain tumor) With gamma knife procedure as well.   KNEE SURGERY     Bilateral, arthroscopic   PACEMAKER INSERTION  04/27/09   Implanted by Dr. Johnney Nam. St. Jude Accent DR (serial [id number])   PPM GENERATOR CHANGEOUT N/A 09/12/2019   Procedure: PPM GENERATOR CHANGEOUT;  Surgeon: Luana Rumple, MD;  Location: [facility];  Service: Cardiovascular;  Laterality: N/A;    FAMILY HISTORY: Family History  Problem Relation Age of Onset   Stroke Mother    Heart disease Father    Heart disease Sister    Heart disease Sister    Heart disease Sister    Heart disease Brother    Heart disease Brother    Heart disease Brother    Seizures Neg Hx     SOCIAL HISTORY: Social History   Socioeconomic History   Marital status: Divorced    Spouse name: Not on file   Number of children: 3   Years of education: Not on file   Highest education level: Not on file  Occupational History    Comment: Retired  Tobacco Use   Smoking status: Former    Current packs/day: 0.00    Types: Cigarettes    Start date: 12/23/1987    Quit date: 12/23/2007    Years since quitting: 16.3   Smokeless tobacco: Never  Substance and Sexual Activity   Alcohol use: No    Alcohol/week: 0.0 standard drinks of alcohol   Drug use: No   Sexual activity: Not on file  Other Topics Concern   Not on file  Social History Narrative   Lives with daughter   Right Handed   Drinks 1-2 cups caffeine daily  Social Drivers of Corporate investment banker Strain: Not on file  Food Insecurity: Not on file  Transportation Needs: Not on file  Physical Activity: Not on file  Stress: Not on file  Social Connections: Not on file  Intimate Partner Violence: Not on file      PHYSICAL EXAM  Vitals:   05/02/24 1336  BP: 133/79  Pulse: 88  Weight: 214 lb (97.1 kg)  Height: 5'  7" (1.702 m)     Body mass index is 33.52 kg/m.  Generalized: Well developed, in no acute distress   Neurological examination  Mentation: Alert oriented to time, place, history taking. Follows all commands speech and language fluent Cranial nerve II-XII: Pupils were equal round reactive to light. Extraocular movements were full, visual field were full on confrontational test. Facial sensation and strength were normal. . Head turning and shoulder shrug  were normal and symmetric. Motor: The motor testing reveals 5 over 5 strength of all 4 extremities. Good symmetric motor tone is noted throughout.  Sensory: Sensory testing is intact to soft touch on all 4 extremities. No evidence of extinction is noted.  Coordination: Cerebellar testing reveals good finger-nose-finger and heel-to-shin bilaterally.  Gait and station: Gait is normal.  Reflexes: Deep tendon reflexes are symmetric and normal bilaterally.   DIAGNOSTIC DATA (LABS, IMAGING, TESTING) - I reviewed patient records, labs, notes, testing and imaging myself where available.  Lab Results  Component Value Date   WBC 5.9 02/19/2024   HGB 9.9 (L) 02/19/2024   HCT 34.3 (L) 02/19/2024   MCV 79.2 (L) 02/19/2024   PLT 220 02/19/2024      Component Value Date/Time   NA 139 02/19/2024 0923   NA 142 03/11/2022 0911   K 4.4 02/19/2024 0923   CL 114 (H) 02/19/2024 0923   CO2 18 (L) 02/19/2024 0923   GLUCOSE 96 02/19/2024 0923   BUN 24 (H) 02/19/2024 0923   BUN 19 03/11/2022 0911   CREATININE 1.48 (H) 02/19/2024 0923   CREATININE 1.54 (H) 02/07/2016 1051   CALCIUM 9.7 02/19/2024 0923   PROT 6.5 02/19/2024 0923   ALBUMIN 3.5 02/19/2024 0923   AST 30 02/19/2024 0923   ALT 15 02/19/2024 0923   ALKPHOS 75 02/19/2024 0923   BILITOT 0.6 02/19/2024 0923   GFRNONAA 34 (L) 02/19/2024 0923   GFRAA 43 (L) 03/30/2020 2143      Physical exam: Exam: Gen: NAD, conversant      CV: No palpitations or chest pain or SOB. VS: Breathing at  a normal rate. Not febrile. Eyes: Conjunctivae clear without exudates or hemorrhage  Neuro: Detailed Neurologic Exam  Speech:    Speech is normal; fluent and spontaneous with normal comprehension.  Cognition:    The patient is oriented to person, place, and time;   Cranial Nerves:    The pupils are equal, round, and reactive to light. Visual fields are full Extraocular movements are intact.  The face is symmetric with normal sensation. The palate elevates in the midline. Hearing intact. Voice is normal. Shoulder shrug is normal. The tongue has normal motion without fasciculations.   Coordination: No deficits noted  Gait: In wheelchair due to gait abnormality  Motor Observation:   no involuntary movements noted. Tone:    Appears normal  Posture:    Posture is normal. normal erect    Strength:    Strength is anti-gravity and symmetric in the upper and lower limbs.      Sensation: intact to LT, no  reports of numbness or tingling or paresthesias          ASSESSMENT AND PLAN 89 y.o. year old female  has a past medical history of ACL (anterior cruciate ligament) rupture, Acute MI (HCC), Arthritis, Back pain, Benign paroxysmal positional vertigo (03/18/2016), Brain tumor (HCC), Bruit, Chest pain, Chronic anticoagulation, Chronic edema, Chronic kidney disease, Coronary artery disease, Coronary atherosclerosis, Dyslipidemia, GERD (gastroesophageal reflux disease), Hyperlipidemia, Memory difficulty (07/13/2014), Meningioma (HCC), Mild depression (HCC), Ovarian cyst, PAF (paroxysmal atrial fibrillation) (HCC), Paroxysmal atrial flutter (HCC), Seizure (HCC) (2006), Severe obesity (HCC), Sick sinus syndrome (HCC), Sinus node dysfunction (HCC), SOB (shortness of breath) on exertion, Tachycardia-bradycardia syndrome (HCC), Trochlear nerve palsy, and Vertigo. here with:reporting recurrence of right-sided headache.   CrCl 46  1. History of multiple meningiomas 2. History of seizures 3. Gait  disturbance 4. Mild memory disturbance 5. Reported headache   -Reporting mild headaches since at least March, located on right temple -CT head in 01/30/2024 was overall stable, no acute abnormalities -Check ESR, CRP today -Recommend early follow-up with Dr. Reyne Cave, for report of more double vision, hesitant to comply with his suggestions of patching or prisms -Unknown etiology of headache, hold off on adding another pill for now -May consider decreasing Topamax  to 25 mg twice a day, has some memory effects, has been hesitant to switch, but daughter thinks they have already decreased due to her not having seizures since resection of meningiomas. Can consider at next appointment -Will have her follow-up in 4-6 months with Dr. Tilda Fogo, if headaches increase or worsen, she is to let me know, her function is otherwise at baseline  Aldona Amel [facility] [address], [address] [place], [place] [address] [phone]  I spent over 40 minutes of face-to-face and non-face-to-face time with patient on the  1. Periorbital headache    diagnosis.  This included previsit chart review, lab review, study review, order entry, electronic health record documentation, patient education on the different diagnostic and therapeutic options, counseling and coordination of care, risks and benefits of management, compliance, or risk factor reduction

## 2024-05-02 NOTE — Patient Instructions (Addendum)
 Getting eye checked for any eye abnormality Try to treat for headaches that can also help with arthritic or musculoskeletal pain: Cymbalta, Gabapentin, Lyrica  At this time take Tylenol , get eye checked and keep me updated through mychart  Temporal Arteritis  Temporal arteritis is a condition that makes your arteries become inflamed. It can happen in any part of the body. Most often, it affects your head and face. It is also called giant cell arteritis. This condition can cause severe problems such as blindness. Early treatment can help prevent those problems. What are the causes? The cause of this condition is not known. What increases the risk? You may be more likely to get this condition if: You are older than 88 years of age. You are female. You are Caucasian. You are of [place], [place], [place], [place], or [place] ancestry. You have a family history of the condition. You have polymyalgia rheumatica (PMR). This is a condition that causes muscle pain and stiffness. What are the signs or symptoms? Some people with this condition have just one symptom. Others have more than one symptom. Most symptoms are related to the head and face. These may include: Headache. Hard, swollen, or tender temples. Your temples are the areas on either side of your forehead. Pain when you comb your hair or when you lay your head down. Pain in your jaw when you chew. Pain in your throat or tongue. Problems with your vision. These may include sudden loss of vision in one eye or seeing double. Other symptoms may include: Fever. Feeling tired (fatigue). A dry cough. Pain in your hips and shoulders. Pain in your arms when you exercise. Depression. Weight loss. How is this diagnosed? This condition may be diagnosed based on your symptoms, your medical history, and a physical exam. You may also have tests done. These may include: Blood tests. Biopsy. This is when a small piece of tissue is removed  for testing. Imaging tests, such as an ultrasound or MRI. How is this treated? This condition may be treated with medicines to: Reduce inflammation (steroids). Weaken your immune system (immunosuppressants). Your immune system is your body's defense system. Treat vision problems. Follow these instructions at home: Medicines Take over-the-counter and prescription medicines only as told by your health care provider. Take any vitamins or supplements that your provider recommends. These may include vitamin D and calcium to help stop your bones from getting weak. Eating and drinking  Eat foods that are good for your heart. These may include: Foods high in fiber, such as fresh fruits and vegetables, whole grains, and beans. Foods that have healthy fats in them (omega-3 fats), such as fish, flaxseed, and flaxseed oil. Limit foods that are high in saturated fat and cholesterol. These include processed and fried foods, fatty meat, and full-fat dairy. Limit how much salt (sodium) you eat. Include calcium and vitamin D in your diet. Good sources of these include: Low-fat dairy products, such as milk, yogurt, and cheese. Certain fish, such as fresh or canned salmon, tuna, and sardines. Fortified products. This means that they have calcium and vitamin D added to them. These include fortified cereals and juice. General instructions Exercise. Talk with your provider about what exercises are safe for you. You may be told to do exercises that make your heart beat faster (aerobic exercise). These can help control your blood pressure and prevent bone loss. Stay up-to-date on all of your vaccines as told by your provider. Keep all follow-up visits. The medicines used to  treat this condition can make you more likely to have problems such as bone loss or diabetes. Your provider will check for problems during your follow-up visits. Contact a health care provider if: Your symptoms get worse. You have any signs  of infection. These may include fever, swelling, redness, warmth, or tenderness. Get help right away if: You lose your vision. Your pain does not go away, even after you take medicine. You have chest pain. You have trouble breathing. One side of your face or body gets weak or numb all of a sudden. These symptoms may be an emergency. Get help right away. Call 911. Do not wait to see if the symptoms will go away. Do not drive yourself to the hospital. This information is not intended to replace advice given to you by your health care provider. Make sure you discuss any questions you have with your health care provider. Document Revised: 09/22/2022 Document Reviewed: 09/22/2022 Elsevier Patient Education  2024 [employer].

## 2024-05-03 ENCOUNTER — Telehealth: Payer: Self-pay | Admitting: Neurology

## 2024-05-03 ENCOUNTER — Ambulatory Visit: Payer: Self-pay | Admitting: Neurology

## 2024-05-03 LAB — SEDIMENTATION RATE: Sed Rate: 49 mm/h — ABNORMAL HIGH (ref 0–40)

## 2024-05-03 LAB — C-REACTIVE PROTEIN: CRP: 2 mg/L (ref 0–10)

## 2024-05-03 NOTE — Telephone Encounter (Signed)
 Pt Daughter ( Claudette) Called in regards to Mother visit Yesterday want to know more information . Daughter is asking for A callback [phone] . Daughter is on [place]

## 2024-05-04 NOTE — Telephone Encounter (Signed)
 When the daughter calls back can we let her know:  Glory Larsen, MD to [facility] Results  (Selected Message)    05/03/24  4:54 PM Result Note Sed rate and crp are not concerning for temporal arteritis thank you C-reactive protein; Sedimentation rate

## 2024-05-04 NOTE — Telephone Encounter (Signed)
 LMVM for daughter, Claudetter.  Returning her call about her mom's appt yesterday with Dr. Tresia Fruit.

## 2024-05-05 NOTE — Telephone Encounter (Signed)
 I called pt and relayed the results of her labs to her.  She was not able to write it down. I told her that have attempted twice to reach her daughter, Claudette.  She will call back when she gets chance.  She verbalized understanding that her labs were not concerning for temporal arteritis.(Condition that the were looking for).  She appreciated call.

## 2024-05-05 NOTE — Telephone Encounter (Signed)
-----   Message from Glory Larsen sent at 05/03/2024  4:54 PM EDT ----- Sed rate and crp are not concerning for temporal arteritis thank you

## 2024-05-05 NOTE — Telephone Encounter (Signed)
 I called pt and gave her the results of the labs.

## 2024-05-05 NOTE — Telephone Encounter (Signed)
 I called and LMVM for Claudette, returning all.  Lab results back not concerning for what [facility] NP was looking for which is good.  Return all when you can.

## 2024-05-19 ENCOUNTER — Encounter: Payer: Self-pay | Admitting: Podiatry

## 2024-05-19 ENCOUNTER — Ambulatory Visit: Admitting: Podiatry

## 2024-05-19 DIAGNOSIS — M79675 Pain in left toe(s): Secondary | ICD-10-CM | POA: Diagnosis not present

## 2024-05-19 DIAGNOSIS — B351 Tinea unguium: Secondary | ICD-10-CM

## 2024-05-19 DIAGNOSIS — M79674 Pain in right toe(s): Secondary | ICD-10-CM

## 2024-05-19 DIAGNOSIS — N183 Chronic kidney disease, stage 3 unspecified: Secondary | ICD-10-CM | POA: Diagnosis not present

## 2024-05-19 NOTE — Progress Notes (Signed)
This patient returns to my office for at risk foot care.  This patient requires this care by a professional since this patient will be at risk due to having coagulation defect and  CKD   This patient is unable to cut nails herself since the patient cannot reach her nails.These nails are painful walking and wearing shoes.  This patient presents for at risk foot care today. Patient presents to the office with her daughter.  General Appearance  Alert, conversant and in no acute stress.  Vascular  Dorsalis pedis and posterior tibial  pulses are not  palpable  Left foot.  Dorsalis pedis pulses are weakly palpable right foot due to swelling..  Capillary return is within normal limits  bilaterally. Temperature is within normal limits  bilaterally.  Neurologic  Senn-Weinstein monofilament wire test within normal limits  bilaterally. Muscle power within normal limits bilaterally.  Nails Thick disfigured discolored nails with subungual debris  from hallux to fifth toes bilaterally. No evidence of bacterial infection or drainage bilaterally.  Orthopedic  No limitations of motion  feet .  No crepitus or effusions noted.  No bony pathology .  Hammer toes 2,3 left foot.  Skin  normotropic skin with no porokeratosis noted bilaterally.  No signs of infections or ulcers noted.     Onychomycosis  Pain in right toes  Pain in left toes  Consent was obtained for treatment procedures.   Mechanical debridement of nails 1-5  bilaterally performed with a nail nipper.  Filed with dremel without incident.    Return office visit  9   weeks                    Told patient to return for periodic foot care and evaluation due to potential at risk complications.   Helane Gunther DPM

## 2024-06-03 ENCOUNTER — Ambulatory Visit (HOSPITAL_COMMUNITY): Attending: Cardiology

## 2024-06-06 ENCOUNTER — Encounter (HOSPITAL_COMMUNITY): Payer: Self-pay | Admitting: Cardiovascular Disease

## 2024-06-06 ENCOUNTER — Ambulatory Visit: Payer: Medicare Other

## 2024-06-06 DIAGNOSIS — I495 Sick sinus syndrome: Secondary | ICD-10-CM | POA: Diagnosis not present

## 2024-06-06 LAB — CUP PACEART REMOTE DEVICE CHECK
Battery Remaining Longevity: 62 mo
Battery Remaining Percentage: 56 %
Battery Voltage: 2.99 V
Brady Statistic AP VP Percent: 1 %
Brady Statistic AP VS Percent: 93 %
Brady Statistic AS VP Percent: 1 %
Brady Statistic AS VS Percent: 6 %
Brady Statistic RA Percent Paced: 63 %
Brady Statistic RV Percent Paced: 12 %
Date Time Interrogation Session: 20250616042412
Implantable Lead Connection Status: 753985
Implantable Lead Connection Status: 753985
Implantable Lead Implant Date: 20100507
Implantable Lead Implant Date: 20100507
Implantable Lead Location: 753859
Implantable Lead Location: 753860
Implantable Pulse Generator Implant Date: 20200921
Lead Channel Impedance Value: 340 Ohm
Lead Channel Impedance Value: 480 Ohm
Lead Channel Pacing Threshold Amplitude: 0.5 V
Lead Channel Pacing Threshold Amplitude: 1 V
Lead Channel Pacing Threshold Pulse Width: 0.5 ms
Lead Channel Pacing Threshold Pulse Width: 0.5 ms
Lead Channel Sensing Intrinsic Amplitude: 12 mV
Lead Channel Sensing Intrinsic Amplitude: 4.9 mV
Lead Channel Setting Pacing Amplitude: 1.5 V
Lead Channel Setting Pacing Amplitude: 2.5 V
Lead Channel Setting Pacing Pulse Width: 0.5 ms
Lead Channel Setting Sensing Sensitivity: 4 mV
Pulse Gen Model: 2272
Pulse Gen Serial Number: 9166074

## 2024-06-15 ENCOUNTER — Ambulatory Visit: Payer: Self-pay | Admitting: Cardiovascular Disease

## 2024-07-06 ENCOUNTER — Encounter: Payer: Self-pay | Admitting: Cardiovascular Disease

## 2024-07-07 ENCOUNTER — Telehealth: Payer: Self-pay | Admitting: Neurology

## 2024-07-07 NOTE — Telephone Encounter (Signed)
 Pt's daughter has called while pt is at eye specialist for the diagnosis that Dr Ines wanted ruled out by eye specialist

## 2024-07-11 NOTE — Telephone Encounter (Signed)
 I called and spoke to daughter Moneca,  pt been to Dr. Fleeta (ophthalmology with Dr. Cleatus office) ? What we wanted them to look for.  I see in notes temporal arteritis, or giant cell arteritis.  Her labs (crp and sed rate) were normal).   Pt R eye ok per daughter, but muscle in face causing pain.  Started her on gabapentin.  Will try to get her last office note from Dr. Fleeta.

## 2024-07-14 NOTE — Progress Notes (Signed)
 Remote pacemaker transmission.

## 2024-07-19 NOTE — Telephone Encounter (Signed)
 Received fax from [facility]. Office note from 07/07/24. Note placed in Dr Sharion office for review.

## 2024-07-20 ENCOUNTER — Encounter: Payer: Self-pay | Admitting: Adult Health

## 2024-07-20 ENCOUNTER — Ambulatory Visit: Payer: Medicare Other | Admitting: Adult Health

## 2024-07-20 VITALS — BP 149/64 | HR 61 | Ht 66.0 in | Wt 221.4 lb

## 2024-07-20 DIAGNOSIS — R519 Headache, unspecified: Secondary | ICD-10-CM | POA: Diagnosis not present

## 2024-07-20 DIAGNOSIS — Z87898 Personal history of other specified conditions: Secondary | ICD-10-CM | POA: Diagnosis not present

## 2024-07-20 DIAGNOSIS — H532 Diplopia: Secondary | ICD-10-CM | POA: Diagnosis not present

## 2024-07-20 DIAGNOSIS — Z86011 Personal history of benign neoplasm of the brain: Secondary | ICD-10-CM

## 2024-07-20 MED ORDER — GABAPENTIN 100 MG PO CAPS: 100.0000 mg | ORAL_CAPSULE | Freq: Every day | ORAL | 11 refills | Status: AC

## 2024-07-20 NOTE — Progress Notes (Signed)
 PATIENT: [name] DOB: [birth year]  REASON FOR VISIT: follow up HISTORY FROM: patient PRIMARY NEUROLOGIST: Dr. Ines  Chief Complaint  Patient presents with   Follow-up    Pt in 19 with daughter Pt here seizure f/u  Pt states hasn't taken topiramate  in 3 months Pt states no seizures Pt states increased headaches,blurry vision,pain behind right eye       HISTORY OF PRESENT ILLNESS: Today 07/20/24:  [name] is a 89 y.o. female with a history of meningioma, seizures. Returns today for follow-up.  She recently saw Dr. Ines for diplopia and periorbital headaches. she states that she has seen ophthalmology.  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  They have tried prisms, eye patch which the patient does not like.  They also discussed a referral to neuro-ophthalmology at [facility] but the patient deferred.  The ophthalmologist also felt that her discomfort around the right eye may be trigeminal neuralgia.  And placed her on gabapentin  300 twice a day.  Patient does feel that this has been helpful for her headaches however daughter states that she is only been able to take 300 mg daily due to it making her feel off balance.  Daughter would like her to try a lower dose.  Patient was also weaned off Topamax  at Dr. Ala discretion.  She states that she has not had any seizure-like episodes.  Patient returns today for evaluation.    12/25/22: [name] is a 89 y.o. female with a history of meningioma and seizures. Returns today for follow-up.  Patient reports that she has not had any seizure events.  Remains on Topamax  50 mg twice a day. Daughter lives with her. Able to complete most ADLS independently. Daughter helps with her bath. She manages her own medications.  Patient reports that she still has trouble with her vision in the right eye.  She is seeing her ophthalmology but unless she has surgery she reports that there is nothing else they can  do.   12/24/21: [name] is an 89 year old female with a history of meningioma and seizures.  She returns today for follow-up.  She reports that she has not had any seizure events.  Reports that her headaches are under good control.  She remains on Topamax  50 mg twice a day.  Reports that her memory is stable.  She states that her double vision is getting worse.  She states that she has an ophthalmologist that she follows with.  Her daughter is with her today.  Daughter states that ophthalmology has identified the problem with her vision however surgery is not an option for the patient due to her being on Coumadin .  I have not reviewed any notes from ophthalmology.  The patient's last CT scan of the head was in 2021 and stable.   08/27/21: [name] is an 89 year old female with a history of multiple meningioma and seizures.  She returns today for follow-up.  She is currently taking Topamax  50 mg twice a day.  At the last visit she was reporting some cognitive changes and Dr. Jenel suggested changing her to long-acting Topamax  or to Zonegran or Lamictal.  The patient did not want to change medication.  The patient's daughter currently lives with her.  She does not operate a motor vehicle.  The patient is mainly concerned that if she switches medication she could have a breakthrough seizure.  The daughter that is with her today does  not notice her memory issues.  Patient reports headaches have remained stable  HISTORY (copied from Dr. Rich note)  Ms. Shambaugh is an 89 year old right-handed black female with a history of multiple meningioma, she has a history of seizures associated with this.  She has done well with Topamax  taking 50 mg twice daily, but she does report some difficulty with memory and concentration.  The patient has had chronic issues with double vision, she has seen ophthalmology for prisms.  She does report some occasional headaches in the right frontal area that may come on once or twice  a year, they stay for about 5 days then go away.  The patient has not had any recent seizures.  She has difficulty walking, she has had bilateral total hip replacements and she walks with a cane or a walker.  She has not had any recent falls.  She lives with her daughter.  She has low back pain is worse when she is stooped.  She does note occasional neck stiffness.  With the headaches, she denies any nausea or vomiting or photophobia or phonophobia.  She may take Ultram  or Tylenol  for the headache.  The patient does not operate a motor vehicle, she has a decrease in vision associated with macular degeneration.   REVIEW OF SYSTEMS: Out of a complete 14 system review of symptoms, the patient complains only of the following symptoms, and all other reviewed systems are negative.  ALLERGIES: Allergies  Allergen Reactions   Dilantin [Phenytoin Sodium Extended] Hives   Phenobarbital Hives          HOME MEDICATIONS: Outpatient Medications Prior to Visit  Medication Sig Dispense Refill   allopurinol (ZYLOPRIM) 100 MG tablet Take 100 mg by mouth daily.     Carboxymethylcellul-Glycerin (LUBRICATING EYE DROPS OP) Place 1 drop into both eyes daily as needed (dry eyes).     colchicine  0.6 MG tablet Take 1 tablet (0.6 mg total) by mouth daily as needed (gout flare). (Patient taking differently: Take 0.6 mg by mouth as needed (gout flare).) 20 tablet 0   metoprolol  succinate (TOPROL -XL) 25 MG 24 hr tablet Take 25 mg by mouth daily.     nitroGLYCERIN  (NITROLINGUAL ) 0.4 MG/SPRAY spray Place 3 sprays under the tongue every 5 (five) minutes x 3 doses as needed for chest pain. 12 g 3   omeprazole (PRILOSEC) 20 MG capsule Take 20 mg by mouth daily.     Polyethyl Glycol-Propyl Glycol (SYSTANE OP) Place 1 drop into both eyes at bedtime as needed (dry eyes).     pravastatin (PRAVACHOL) 20 MG tablet Take 20 mg by mouth at bedtime.     torsemide  (DEMADEX ) 20 MG tablet TAKE 2 TABLETS (40 MG TOTAL) BY MOUTH DAILY. 180  tablet 2   verapamil  (VERELAN ) 180 MG 24 hr capsule Take 1 capsule (180 mg total) by mouth at bedtime. 90 capsule 3   warfarin (COUMADIN ) 4 MG tablet Take 1 tablet by mouth daily. (Patient taking differently: Take 1 tablet by mouth daily. Pt states takes 3mg )     HYDROcodone-acetaminophen  (NORCO/VICODIN) 5-325 MG tablet Take 1 tablet by mouth every 6 (six) hours as needed.     potassium chloride  (KLOR-CON  M) 10 MEQ tablet TAKE 1 TABLET BY MOUTH EVERY DAY (Patient not taking: Reported on 07/20/2024) 90 tablet 3   predniSONE (DELTASONE) 5 MG tablet Take 5 mg by mouth daily at 6 (six) AM. (Patient not taking: Reported on 07/20/2024)     topiramate  (TOPAMAX ) 50 MG tablet TAKE  1 TABLET BY MOUTH TWICE A DAY (Patient not taking: Reported on 07/20/2024) 180 tablet 1   traMADol  (ULTRAM ) 50 MG tablet Take 50 mg by mouth at bedtime as needed for moderate pain (pain score 4-6) (sleep).     warfarin (COUMADIN ) 4 MG tablet Take 4 mg by mouth See admin instructions. Take 4 mg at night on Mon, Tue, Wed, Fri, and Sat (Patient not taking: Reported on 07/20/2024)     warfarin (COUMADIN ) 5 MG tablet Take 5 mg by mouth See admin instructions. Take 5 mg at night on Sun and Tamara (Patient not taking: Reported on 07/20/2024)     No facility-administered medications prior to visit.    PAST MEDICAL HISTORY: Past Medical History:  Diagnosis Date   ACL (anterior cruciate ligament) rupture    Left   Acute MI (HCC)    Arthritis    Back pain    Secondary to arthritis   Benign paroxysmal positional vertigo 03/18/2016   Brain tumor [facility])    On right side. Removed in 1989   Bruit    Duplex doppler 03/28/05 Mildly abnormal. *Right subclavian artery: Less than 50% diameter reduction.   Chest pain    2D Echo 04/21/08 EF = >55%. Moderate tricuspid regurgitation. Persantine Myovoew stress test 04/21/08 EF = 81%, no evidence of ischemia noted.   Chronic anticoagulation    PAF   Chronic edema    Mild LE edema   Chronic kidney disease     Mild, stage 2   Coronary artery disease    Coronary atherosclerosis    Minimal   Dyslipidemia    GERD (gastroesophageal reflux disease)    Hyperlipidemia    On pravastatin   Memory difficulty 07/13/2014   Meningioma (HCC)    Resection in 1998   Mild depression    Ovarian cyst    Benign. Had a salpiingoophorectomy in 2005.   PAF (paroxysmal atrial fibrillation) (HCC)    On coumadin . St. Jude PPM (serial [id number]) inserted 2010   Paroxysmal atrial flutter (HCC)    Seizure (HCC) 2006   Severe obesity (HCC)    Sick sinus syndrome (HCC)    Sinus node dysfunction (HCC)    S/P implantation of a dual-chamber permanent pacemaker in 2010   SOB (shortness of breath) on exertion    Class II. Catheterization 04/30/10 showed mild CAD with mildly elevated right heart pressures.    Tachycardia-bradycardia syndrome (HCC)    S/P implantation of dual-chamber permanent pacemaker in 2010   Trochlear nerve palsy    Right   Vertigo     PAST SURGICAL HISTORY: Past Surgical History:  Procedure Laterality Date   Brain tumor removal  1989   CARDIAC CATHETERIZATION  04/30/10   Showed mild CAD with mildly elevated right heart pressures. (Dr. Leafy)   CRANIOTOMY  1989   (Brain tumor) With gamma knife procedure as well.   KNEE SURGERY     Bilateral, arthroscopic   PACEMAKER INSERTION  04/27/09   Implanted by Dr. Leafy. St. Jude Accent DR (serial [id number])   PPM GENERATOR CHANGEOUT N/A 09/12/2019   Procedure: PPM GENERATOR CHANGEOUT;  Surgeon: Francyne Headland, MD;  Location: [facility] INVASIVE CV [facility];  Service: Cardiovascular;  Laterality: N/A;    FAMILY HISTORY: Family History  Problem Relation Age of Onset   Stroke Mother    Heart disease Father    Heart disease Sister    Heart disease Sister    Heart disease Sister    Heart disease Brother  Heart disease Brother    Heart disease Brother    Seizures Neg Hx     SOCIAL HISTORY: Social History   Socioeconomic History   Marital status:  Divorced    Spouse name: Not on file   Number of children: 3   Years of education: Not on file   Highest education level: Not on file  Occupational History    Comment: Retired  Tobacco Use   Smoking status: Former    Current packs/day: 0.00    Types: Cigarettes    Start date: 12/23/1987    Quit date: 12/23/2007    Years since quitting: 16.5   Smokeless tobacco: Never  Vaping Use   Vaping status: Never Used  Substance and Sexual Activity   Alcohol use: No    Alcohol/week: 0.0 standard drinks of alcohol   Drug use: No   Sexual activity: Not on file  Other Topics Concern   Not on file  Social History Narrative   Lives with daughter   Right Handed   Drinks 1-2 cups caffeine daily   Retired    [employer] investment [employer] Strain: Not on file  Food Insecurity: Not on file  Transportation Needs: Not on file  Physical Activity: Not on file  Stress: Not on file  Social Connections: Not on file  Intimate Partner Violence: Not on file      PHYSICAL EXAM  Vitals:   07/20/24 1112  BP: (!) 149/64  Pulse: 61  Weight: 221 lb 6.4 oz (100.4 kg)  Height: 5' 6 (1.676 m)     Body mass index is 35.73 kg/m.      Generalized: Well developed, in no acute distress   Neurological examination  Mentation: Alert oriented to time, place, history taking. Follows all commands speech and language fluent Cranial nerve II-XII: Pupils were equal round reactive to light. Extraocular movements were full, visual field were full on confrontational test. Facial sensation and strength were normal. . Head turning and shoulder shrug  were normal and symmetric. Motor: The motor testing reveals 5 over 5 strength of all 4 extremities. Good symmetric motor tone is noted throughout.  Sensory: Sensory testing is intact to soft touch on all 4 extremities. No evidence of extinction is noted.  Coordination: Cerebellar testing reveals good finger-nose-finger and heel-to-shin bilaterally.   Gait and station: Patient is in a wheelchair. Reflexes: Deep tendon reflexes are symmetric and normal bilaterally.   DIAGNOSTIC DATA (LABS, IMAGING, TESTING) - I reviewed patient records, labs, notes, testing and imaging myself where available.  Lab Results  Component Value Date   WBC 5.9 02/19/2024   HGB 9.9 (L) 02/19/2024   HCT 34.3 (L) 02/19/2024   MCV 79.2 (L) 02/19/2024   PLT 220 02/19/2024      Component Value Date/Time   NA 139 02/19/2024 0923   NA 142 03/11/2022 0911   K 4.4 02/19/2024 0923   CL 114 (H) 02/19/2024 0923   CO2 18 (L) 02/19/2024 0923   GLUCOSE 96 02/19/2024 0923   BUN 24 (H) 02/19/2024 0923   BUN 19 03/11/2022 0911   CREATININE 1.48 (H) 02/19/2024 0923   CREATININE 1.54 (H) 02/07/2016 1051   CALCIUM 9.7 02/19/2024 0923   PROT 6.5 02/19/2024 0923   ALBUMIN 3.5 02/19/2024 0923   AST 30 02/19/2024 0923   ALT 15 02/19/2024 0923   ALKPHOS 75 02/19/2024 0923   BILITOT 0.6 02/19/2024 0923   GFRNONAA 34 (L) 02/19/2024 0923   GFRAA 43 (  L) 03/30/2020 2143        ASSESSMENT AND PLAN 89 y.o. year old female  has a past medical history of ACL (anterior cruciate ligament) rupture, Acute MI (HCC), Arthritis, Back pain, Benign paroxysmal positional vertigo (03/18/2016), Brain tumor (HCC), Bruit, Chest pain, Chronic anticoagulation, Chronic edema, Chronic kidney disease, Coronary artery disease, Coronary atherosclerosis, Dyslipidemia, GERD (gastroesophageal reflux disease), Hyperlipidemia, Memory difficulty (07/13/2014), Meningioma (HCC), Mild depression, Ovarian cyst, PAF (paroxysmal atrial fibrillation) (HCC), Paroxysmal atrial flutter (HCC), Seizure (HCC) (2006), Severe obesity (HCC), Sick sinus syndrome (HCC), Sinus node dysfunction (HCC), SOB (shortness of breath) on exertion, Tachycardia-bradycardia syndrome (HCC), Trochlear nerve palsy, and Vertigo. here with :  Seizures History of meningioma Diplopia Right-sided headaches  -No longer on Topamax .  No  seizure events.  Did advise that if she has any seizure-like episodes they should let us  know -Ophthalmologist placed her on gabapentin  300 mg twice a day-patient having some side effects including feeling drowsy and off balance.  We will decrease dose to 100 mg daily.  Can slowly increase this if needed - Encouraged to continue following up with ophthalmology.  Certainly if she decides that she wants to see a neuro-ophthalmologist her ophthalmologist can place a referral. -Patient requested to keep follow-up in October with Dr. Ines  Meds ordered this encounter  Medications   gabapentin  (NEURONTIN ) 100 MG capsule    Sig: Take 1 capsule (100 mg total) by mouth daily.    Dispense:  30 capsule    Refill:  11    Supervising Provider:   INES ONETHA NOVAK [[id number]]     Duwaine Russell, MSN, NP-C 07/20/2024, 11:14 AM [facility] [address], [address] [place], [place] [address] [phone]  The patient's condition requires frequent monitoring and adjustments in the treatment plan, reflecting the ongoing complexity of care.  This provider is the continuing focal point for all needed services for this condition.

## 2024-07-20 NOTE — Patient Instructions (Signed)
 Your Plan:  Decrease gabapentin  to 100 mg daily. Monitor for drowsiness and gait disturbance If your symptoms worsen or you develop new symptoms please let us  know.       Thank you for coming to see us  at [facility]. I hope we have been able to provide you high quality care today.  You may receive a patient satisfaction survey over the next few weeks. We would appreciate your feedback and comments so that we may continue to improve ourselves and the health of our patients.

## 2024-07-21 ENCOUNTER — Ambulatory Visit: Admitting: Podiatry

## 2024-07-27 ENCOUNTER — Encounter: Payer: Self-pay | Admitting: Podiatry

## 2024-07-27 ENCOUNTER — Ambulatory Visit: Admitting: Podiatry

## 2024-07-27 DIAGNOSIS — B351 Tinea unguium: Secondary | ICD-10-CM | POA: Diagnosis not present

## 2024-07-27 DIAGNOSIS — M79675 Pain in left toe(s): Secondary | ICD-10-CM

## 2024-07-27 DIAGNOSIS — M79674 Pain in right toe(s): Secondary | ICD-10-CM | POA: Diagnosis not present

## 2024-07-27 DIAGNOSIS — N183 Chronic kidney disease, stage 3 unspecified: Secondary | ICD-10-CM

## 2024-07-27 NOTE — Progress Notes (Signed)
This patient returns to my office for at risk foot care.  This patient requires this care by a professional since this patient will be at risk due to having coagulation defect and  CKD   This patient is unable to cut nails herself since the patient cannot reach her nails.These nails are painful walking and wearing shoes.  This patient presents for at risk foot care today. Patient presents to the office with her daughter.  General Appearance  Alert, conversant and in no acute stress.  Vascular  Dorsalis pedis and posterior tibial  pulses are not  palpable  Left foot.  Dorsalis pedis pulses are weakly palpable right foot due to swelling..  Capillary return is within normal limits  bilaterally. Temperature is within normal limits  bilaterally.  Neurologic  Senn-Weinstein monofilament wire test within normal limits  bilaterally. Muscle power within normal limits bilaterally.  Nails Thick disfigured discolored nails with subungual debris  from hallux to fifth toes bilaterally. No evidence of bacterial infection or drainage bilaterally.  Orthopedic  No limitations of motion  feet .  No crepitus or effusions noted.  No bony pathology .  Hammer toes 2,3 left foot.  Skin  normotropic skin with no porokeratosis noted bilaterally.  No signs of infections or ulcers noted.     Onychomycosis  Pain in right toes  Pain in left toes  Consent was obtained for treatment procedures.   Mechanical debridement of nails 1-5  bilaterally performed with a nail nipper.  Filed with dremel without incident.    Return office visit  9   weeks                    Told patient to return for periodic foot care and evaluation due to potential at risk complications.   Helane Gunther DPM

## 2024-08-11 ENCOUNTER — Observation Stay (HOSPITAL_COMMUNITY): Admission: EM | Admit: 2024-08-11 | Discharge: 2024-08-13 | Disposition: A | Discharge status: Home Health

## 2024-08-11 ENCOUNTER — Encounter (HOSPITAL_COMMUNITY): Payer: Self-pay

## 2024-08-11 ENCOUNTER — Other Ambulatory Visit: Payer: Self-pay

## 2024-08-11 ENCOUNTER — Emergency Department (HOSPITAL_COMMUNITY)

## 2024-08-11 DIAGNOSIS — Z950 Presence of cardiac pacemaker: Secondary | ICD-10-CM | POA: Diagnosis not present

## 2024-08-11 DIAGNOSIS — Z7901 Long term (current) use of anticoagulants: Secondary | ICD-10-CM | POA: Diagnosis not present

## 2024-08-11 DIAGNOSIS — E66812 Obesity, class 2: Secondary | ICD-10-CM | POA: Insufficient documentation

## 2024-08-11 DIAGNOSIS — Z87891 Personal history of nicotine dependence: Secondary | ICD-10-CM | POA: Insufficient documentation

## 2024-08-11 DIAGNOSIS — Z79899 Other long term (current) drug therapy: Secondary | ICD-10-CM | POA: Insufficient documentation

## 2024-08-11 DIAGNOSIS — R0789 Other chest pain: Secondary | ICD-10-CM | POA: Diagnosis not present

## 2024-08-11 DIAGNOSIS — M109 Gout, unspecified: Secondary | ICD-10-CM | POA: Diagnosis not present

## 2024-08-11 DIAGNOSIS — I480 Paroxysmal atrial fibrillation: Secondary | ICD-10-CM

## 2024-08-11 DIAGNOSIS — R5381 Other malaise: Secondary | ICD-10-CM | POA: Diagnosis not present

## 2024-08-11 DIAGNOSIS — I130 Hypertensive heart and chronic kidney disease with heart failure and stage 1 through stage 4 chronic kidney disease, or unspecified chronic kidney disease: Secondary | ICD-10-CM | POA: Insufficient documentation

## 2024-08-11 DIAGNOSIS — K219 Gastro-esophageal reflux disease without esophagitis: Secondary | ICD-10-CM | POA: Diagnosis not present

## 2024-08-11 DIAGNOSIS — D649 Anemia, unspecified: Secondary | ICD-10-CM | POA: Insufficient documentation

## 2024-08-11 DIAGNOSIS — R079 Chest pain, unspecified: Principal | ICD-10-CM | POA: Diagnosis present

## 2024-08-11 DIAGNOSIS — I495 Sick sinus syndrome: Secondary | ICD-10-CM | POA: Insufficient documentation

## 2024-08-11 DIAGNOSIS — I5032 Chronic diastolic (congestive) heart failure: Secondary | ICD-10-CM | POA: Diagnosis not present

## 2024-08-11 DIAGNOSIS — R519 Headache, unspecified: Secondary | ICD-10-CM | POA: Insufficient documentation

## 2024-08-11 DIAGNOSIS — N1832 Chronic kidney disease, stage 3b: Secondary | ICD-10-CM | POA: Diagnosis not present

## 2024-08-11 DIAGNOSIS — I2510 Atherosclerotic heart disease of native coronary artery without angina pectoris: Secondary | ICD-10-CM | POA: Diagnosis not present

## 2024-08-11 DIAGNOSIS — E785 Hyperlipidemia, unspecified: Secondary | ICD-10-CM | POA: Diagnosis not present

## 2024-08-11 DIAGNOSIS — Z6836 Body mass index (BMI) 36.0-36.9, adult: Secondary | ICD-10-CM | POA: Insufficient documentation

## 2024-08-11 DIAGNOSIS — I200 Unstable angina: Secondary | ICD-10-CM | POA: Diagnosis not present

## 2024-08-11 DIAGNOSIS — M6281 Muscle weakness (generalized): Secondary | ICD-10-CM | POA: Diagnosis not present

## 2024-08-11 DIAGNOSIS — I10 Essential (primary) hypertension: Secondary | ICD-10-CM

## 2024-08-11 LAB — T4, FREE: Free T4: 0.81 ng/dL (ref 0.61–1.12)

## 2024-08-11 LAB — CBC WITH DIFFERENTIAL/PLATELET
Abs Immature Granulocytes: 0.03 K/uL (ref 0.00–0.07)
Basophils Absolute: 0.1 K/uL (ref 0.0–0.1)
Basophils Relative: 1 %
Eosinophils Absolute: 0 K/uL (ref 0.0–0.5)
Eosinophils Relative: 0 %
HCT: 33 % — ABNORMAL LOW (ref 36.0–46.0)
Hemoglobin: 9.4 g/dL — ABNORMAL LOW (ref 12.0–15.0)
Immature Granulocytes: 1 %
Lymphocytes Relative: 25 %
Lymphs Abs: 1.5 K/uL (ref 0.7–4.0)
MCH: 22.4 pg — ABNORMAL LOW (ref 26.0–34.0)
MCHC: 28.5 g/dL — ABNORMAL LOW (ref 30.0–36.0)
MCV: 78.6 fL — ABNORMAL LOW (ref 80.0–100.0)
Monocytes Absolute: 0.6 K/uL (ref 0.1–1.0)
Monocytes Relative: 10 %
Neutro Abs: 3.9 K/uL (ref 1.7–7.7)
Neutrophils Relative %: 63 %
Platelets: 269 K/uL (ref 150–400)
RBC: 4.2 MIL/uL (ref 3.87–5.11)
RDW: 18.4 % — ABNORMAL HIGH (ref 11.5–15.5)
WBC: 6.1 K/uL (ref 4.0–10.5)
nRBC: 0 % (ref 0.0–0.2)

## 2024-08-11 LAB — BASIC METABOLIC PANEL WITH GFR
Anion gap: 16 — ABNORMAL HIGH (ref 5–15)
BUN: 21 mg/dL (ref 8–23)
CO2: 23 mmol/L (ref 22–32)
Calcium: 9.9 mg/dL (ref 8.9–10.3)
Chloride: 106 mmol/L (ref 98–111)
Creatinine, Ser: 1.73 mg/dL — ABNORMAL HIGH (ref 0.44–1.00)
GFR, Estimated: 28 mL/min — ABNORMAL LOW (ref >60)
Glucose, Bld: 136 mg/dL — ABNORMAL HIGH (ref 70–99)
Potassium: 3.6 mmol/L (ref 3.5–5.1)
Sodium: 145 mmol/L (ref 135–145)

## 2024-08-11 LAB — HEMOGLOBIN A1C
Hgb A1c MFr Bld: 5.5 % (ref 4.8–5.6)
Mean Plasma Glucose: 111.15 mg/dL

## 2024-08-11 LAB — TYPE AND SCREEN
ABO/RH(D): O POS
Antibody Screen: NEGATIVE

## 2024-08-11 LAB — HEPATIC FUNCTION PANEL
ALT: 18 U/L (ref 0–44)
AST: 25 U/L (ref 15–41)
Albumin: 3.1 g/dL — ABNORMAL LOW (ref 3.5–5.0)
Alkaline Phosphatase: 85 U/L (ref 38–126)
Bilirubin, Direct: <0.1 mg/dL (ref 0.0–0.2)
Total Bilirubin: 0.4 mg/dL (ref 0.0–1.2)
Total Protein: 5.9 g/dL — ABNORMAL LOW (ref 6.5–8.1)

## 2024-08-11 LAB — TROPONIN I (HIGH SENSITIVITY)
Troponin I (High Sensitivity): 8 ng/L (ref <18)
Troponin I (High Sensitivity): 7 ng/L (ref <18)

## 2024-08-11 LAB — PROTIME-INR
INR: 2.8 — ABNORMAL HIGH (ref 0.8–1.2)
Prothrombin Time: 30.9 s — ABNORMAL HIGH (ref 11.4–15.2)

## 2024-08-11 LAB — TSH: TSH: 2.201 u[IU]/mL (ref 0.350–4.500)

## 2024-08-11 LAB — BRAIN NATRIURETIC PEPTIDE: B Natriuretic Peptide: 166.1 pg/mL — ABNORMAL HIGH (ref 0.0–100.0)

## 2024-08-11 MED ORDER — TORSEMIDE 20 MG PO TABS
40.0000 mg | ORAL_TABLET | Freq: Once | ORAL | Status: AC
  Administered 2024-08-11: 40 mg via ORAL
  Filled 2024-08-11: qty 2

## 2024-08-11 MED ORDER — PANTOPRAZOLE SODIUM 40 MG IV SOLR
40.0000 mg | Freq: Two times a day (BID) | INTRAVENOUS | Status: DC
  Administered 2024-08-11: 40 mg via INTRAVENOUS
  Filled 2024-08-11: qty 10

## 2024-08-11 MED ORDER — TORSEMIDE 20 MG PO TABS: 20.0000 mg | ORAL_TABLET | Freq: Two times a day (BID) | ORAL | Status: DC

## 2024-08-11 MED ORDER — ACETAMINOPHEN 325 MG PO TABS
650.0000 mg | ORAL_TABLET | Freq: Four times a day (QID) | ORAL | Status: DC | PRN
  Administered 2024-08-11: 650 mg via ORAL
  Filled 2024-08-11: qty 2

## 2024-08-11 MED ORDER — HEPARIN SODIUM (PORCINE) 5000 UNIT/ML IJ SOLN: 5000.0000 [IU] | Freq: Three times a day (TID) | INTRAMUSCULAR | Status: DC

## 2024-08-11 MED ORDER — WARFARIN - PHARMACIST DOSING INPATIENT: Freq: Every day | Status: DC

## 2024-08-11 MED ORDER — METOPROLOL SUCCINATE ER 25 MG PO TB24
25.0000 mg | ORAL_TABLET | Freq: Every day | ORAL | Status: DC
Start: 2024-08-12 — End: 2024-08-13
  Administered 2024-08-12 – 2024-08-13 (×2): 25 mg via ORAL
  Filled 2024-08-11 (×2): qty 1

## 2024-08-11 MED ORDER — POLYETHYLENE GLYCOL 3350 17 G PO PACK: 17.0000 g | PACK | Freq: Every day | ORAL | Status: DC | PRN

## 2024-08-11 MED ORDER — ACETAMINOPHEN 650 MG RE SUPP: 650.0000 mg | Freq: Four times a day (QID) | RECTAL | Status: DC | PRN

## 2024-08-11 MED ORDER — HYDRALAZINE HCL 20 MG/ML IJ SOLN
5.0000 mg | INTRAMUSCULAR | Status: DC | PRN
  Filled 2024-08-11: qty 1

## 2024-08-11 MED ORDER — MORPHINE SULFATE (PF) 2 MG/ML IV SOLN: 2.0000 mg | INTRAVENOUS | Status: DC | PRN

## 2024-08-11 MED ORDER — SODIUM CHLORIDE 0.9% FLUSH
3.0000 mL | Freq: Two times a day (BID) | INTRAVENOUS | Status: DC
  Administered 2024-08-11 – 2024-08-13 (×5): 3 mL via INTRAVENOUS

## 2024-08-11 MED ORDER — WARFARIN SODIUM 2 MG PO TABS
4.0000 mg | ORAL_TABLET | Freq: Once | ORAL | Status: AC
  Administered 2024-08-11: 4 mg via ORAL
  Filled 2024-08-11: qty 2

## 2024-08-11 MED ORDER — GABAPENTIN 100 MG PO CAPS
100.0000 mg | ORAL_CAPSULE | Freq: Every day | ORAL | Status: DC
  Administered 2024-08-11: 100 mg via ORAL
  Filled 2024-08-11: qty 1

## 2024-08-11 MED ORDER — VERAPAMIL HCL ER 180 MG PO TBCR
180.0000 mg | EXTENDED_RELEASE_TABLET | Freq: Every day | ORAL | Status: DC
  Administered 2024-08-11 – 2024-08-12 (×2): 180 mg via ORAL
  Filled 2024-08-11 (×3): qty 1

## 2024-08-11 NOTE — Consult Note (Addendum)
 Cardiology Consultation   Patient ID: [name] MRN: [id number]; DOB: 10/31/1932  Admit date: 08/11/2024 Date of Consult: 08/11/2024  PCP:  [name] Iha, MD   [facility] Providers Cardiologist:  [name] Balding, MD      Patient Profile: [name] is a 89 y.o. female with a hx of hypertension, chronic diastolic heart failure, hypercholesterolemia, gout, hx of meningioma with previous gamma knife tx c/b seizures well controlled on topamax , paroxysmal atrial fibrillation on warfarin, CKD and tachy-brady syndrome s/p dual chamber pacemaker in  2010 with generator change in 2020,  who is being seen 08/11/2024 for the evaluation of chest pain at the request of [name] Medicus MD.  History of Present Illness: [name] [facility] with [facility], Dr. Balding has been her primary cardiologist for over 10 years. She has a history of paroxysmal atrial fibrillation and subsequently developed SSS requiring pacemaker placement in 2010. She underwent a generator change in 2020. She also follows with Dr. Lavina for her chronic diastolic heart failure that has been managed with as needed diuretics, typically once a month. She previously did not tolerate SGLT2i 2/2 recurrent UTI infections. Her last cardiac catheterization was in 2011 which showed mild CAD [ 60% stenosis to the mid RCA, LM was without disease, 50% ostial Lcx lesion, and sequential 52% stenosis in the proximal and midportion of the LAD.] This was obtained due to progressive shortness of breath. RHC was normal.  Her most recent echocardiogram was in 2019 which showed LVEF 50-55% with no RWMA. Indeterminate diastolic parameters. Of note she also was previously was on verampil but was discontinued 2/2 hypotension.  She saw him on 03/03/2024 reported being more sedentary due to MSK problems. Her pacemaker was interrogated at that time which showed normal function with a battery life > 5 years with 70% a -pacing. Her AF  burden was 15-20%.    Patient states that she had chest pain in the past but none in the past 10 years.  Over the past 2 to 3 days that she has had intermittent pain under her left breast.  It is described as a sharp pain lasting seconds at a time.  It is not pleuritic, positional or exertional.  Resolved spontaneously.  She has had some associated dyspnea but no nausea, vomiting or diaphoresis.  She has taken nitroglycerin .  She has chronic dyspnea on exertion unchanged.  She has bilateral lower extremity edema which may be slightly worse.  She has not had syncope.  No melena or hematochezia.  Cardiology asked to evaluate for chest pain.  Past Medical History:  Diagnosis Date   ACL (anterior cruciate ligament) rupture    Left   Acute MI (HCC)    Arthritis    Back pain    Secondary to arthritis   Benign paroxysmal positional vertigo 03/18/2016   Brain tumor [facility])    On right side. Removed in 1989   Bruit    Duplex doppler 03/28/05 Mildly abnormal. *Right subclavian artery: Less than 50% diameter reduction.   Chest pain    2D Echo 04/21/08 EF = >55%. Moderate tricuspid regurgitation. Persantine Myovoew stress test 04/21/08 EF = 81%, no evidence of ischemia noted.   Chronic anticoagulation    PAF   Chronic edema    Mild LE edema   Chronic kidney disease    Mild, stage 2   Coronary artery disease    Coronary atherosclerosis    Minimal   Dyslipidemia    GERD (gastroesophageal reflux  disease)    Hyperlipidemia    On pravastatin   Memory difficulty 07/13/2014   Meningioma (HCC)    Resection in 1998   Mild depression    Ovarian cyst    Benign. Had a salpiingoophorectomy in 2005.   PAF (paroxysmal atrial fibrillation) (HCC)    On coumadin . St. Jude PPM (serial [id number]) inserted 2010   Paroxysmal atrial flutter (HCC)    Seizure (HCC) 2006   Severe obesity (HCC)    Sick sinus syndrome (HCC)    Sinus node dysfunction (HCC)    S/P implantation of a dual-chamber permanent pacemaker in 2010    SOB (shortness of breath) on exertion    Class II. Catheterization 04/30/10 showed mild CAD with mildly elevated right heart pressures.    Tachycardia-bradycardia syndrome (HCC)    S/P implantation of dual-chamber permanent pacemaker in 2010   Trochlear nerve palsy    Right   Vertigo     Past Surgical History:  Procedure Laterality Date   Brain tumor removal  1989   CARDIAC CATHETERIZATION  04/30/10   Showed mild CAD with mildly elevated right heart pressures. (Dr. Leafy)   CRANIOTOMY  1989   (Brain tumor) With gamma knife procedure as well.   KNEE SURGERY     Bilateral, arthroscopic   PACEMAKER INSERTION  04/27/09   Implanted by Dr. Leafy. St. Jude Accent DR (serial [id number])   PPM GENERATOR CHANGEOUT N/A 09/12/2019   Procedure: PPM GENERATOR CHANGEOUT;  Surgeon: [name] Headland, MD;  Location: [facility];  Service: Cardiovascular;  Laterality: N/A;     Scheduled Meds:  Continuous Infusions:  PRN Meds:   Allergies:    Allergies  Allergen Reactions   Dilantin [Phenytoin Sodium Extended] Hives   Phenobarbital Hives          Social History:   Social History   Socioeconomic History   Marital status: Divorced    Spouse name: Not on file   Number of children: 3   Years of education: Not on file   Highest education level: Not on file  Occupational History    Comment: Retired  Tobacco Use   Smoking status: Former    Current packs/day: 0.00    Types: Cigarettes    Start date: 12/23/1987    Quit date: 12/23/2007    Years since quitting: 16.6   Smokeless tobacco: Never  Vaping Use   Vaping status: Never Used  Substance and Sexual Activity   Alcohol use: No    Alcohol/week: 0.0 standard drinks of alcohol   Drug use: No   Sexual activity: Not on file  Other Topics Concern   Not on file  Social History Narrative   Lives with daughter   Right Handed   Drinks 1-2 cups caffeine daily   Retired    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  No fevers, chills, productive cough. All other ROS reviewed and negative.     Physical Exam/Data: Vitals:   08/11/24 1008 08/11/24 1045 08/11/24 1115 08/11/24 1245  BP: (!) 157/96 (!) 147/61 (!) 161/57 (!) 158/64  Pulse: 63 68 60 63  Resp: 11 16 19 20   Temp: 98 F (36.7 C)     SpO2: 100% 100% 100% 99%   No intake or output data in the 24 hours ending 08/11/24 1347    07/20/2024   11:12 AM 05/02/2024    1:36 PM 03/03/2024    7:55 AM  Last 3 Weights  Weight (lbs) 221 lb 6.4 oz 214 lb 216 lb 6.4 oz  Weight (kg) 100.426 kg 97.07 kg 98.158 kg     There is no height or weight on file to calculate BMI.  General:  Well nourished, obese, in no acute distress HEENT: normal Neck: no JVD Vascular: No carotid bruits; Distal pulses 2+ bilaterally Cardiac:  normal S1, S2; RRR; no murmur  Lungs:  clear to auscultation bilaterally, no wheezing, rhonchi or rales; pacemaker left upper chest Abd: soft, nontender, no hepatomegaly  Ext: Trace to 1+ edema Musculoskeletal:  No deformities, BUE and BLE strength normal and equal Skin: warm and dry  Neuro:  CNs 2-12 intact, no focal abnormalities noted Psych:  Normal affect   EKG:  The EKG was personally reviewed and demonstrates: Atrial paced rhythm with no ST changes. Telemetry:  Telemetry was personally reviewed and demonstrates:  A-paced HR 65-70  Laboratory Data: High Sensitivity Troponin:   Recent Labs   Lab 08/11/24 1041  TROPONINIHS 8     Chemistry Recent Labs  Lab 08/11/24 1041  NA 145  K 3.6  CL 106  CO2 23  GLUCOSE 136*  BUN 21  CREATININE 1.73*  CALCIUM 9.9  GFRNONAA 28*  ANIONGAP 16*     Hematology Recent Labs  Lab 08/11/24 1041  WBC 6.1  RBC 4.20  HGB 9.4*  HCT 33.0*  MCV 78.6*  MCH 22.4*  MCHC 28.5*  RDW 18.4*  PLT 269    BNP Recent Labs  Lab 08/11/24 1041  BNP 166.1*     Radiology/Studies:  DG Chest Port 1 View Result Date: 08/11/2024 CLINICAL DATA:  Chest pain. EXAM: PORTABLE CHEST 1 VIEW COMPARISON:  03/30/2020. FINDINGS: The heart size and mediastinal contours are within normal limits. Left-sided dual lead pacemaker with leads overlying the right atrium and right ventricle. Aortic atherosclerosis. Minimal linear scarring/atelectasis in the left mid to lower lung zone, similar to the prior exam. No focal consolidation, pleural effusion, or pneumothorax. Advanced degenerative changes of the bilateral glenohumeral joints. IMPRESSION: No acute cardiopulmonary findings. Electronically Signed   By: Harrietta Sherry M.D.   On: 08/11/2024 10:38     Assessment and Plan: Chest Pain-symptoms are atypical.  Electrocardiogram shows no ST changes.  Initial troponin normal.  Would admit to telemetry and cycle enzymes.  If negative pt can be discharged tomorrow and she will follow-up with Dr. Francyne.  Best option may be conservative measures given patient's age and baseline renal insufficiency/risk of contrast nephropathy.  Could also consider stress nuclear study to screen for significant ischemia.  CAD-continue statin.  She is not on aspirin given need for Coumadin .  Hyperlipidemia Continue pravastatin 20  Chronic Diastolic Heart Failure-BNP 166.  Minimal pedal edema.  Would continue preadmission dose of diuretic.  Hypertension continue preadmission medications.  Follow blood pressure and adjust regimen  as needed. BP: 161/57  Paroxysmal Atrial  Fibrillation-patient is in sinus rhythm.  Continue Toprol  and verapamil .  Continue Coumadin  with goal INR 2-3.  CHA2DS2-VASc is 6.  SSS s/p pacemaker   CKD (Baseline ~ 1.5) Cr today 1.73  GERD On prilosec     For questions or updates, please contact [facility] Please consult [email or web address] for contact info under    Signed, Redell Shallow, MD  08/11/2024 1:47 PM

## 2024-08-11 NOTE — ED Triage Notes (Signed)
 Pt bib [facility] for c/o chest pain  beginning last night/ Took 2 nitroglycerin  sprays last night with relief. Woke up this morning again with CP and took 1 nitroglycerin  spray again this morning with relief. Has atrial pacemaker. Denies n/v/d, shob. Hx of 2 heart attacks  148/80 76 hr

## 2024-08-11 NOTE — H&P (Signed)
 History and Physical    Patient: [name] FMW:[id number] DOB: 08/26/1932 DOA: 08/11/2024 DOS: the patient was seen and examined on 08/11/2024 . PCP: Theo Iha, MD  Patient coming from: Home Chief complaint: Chief Complaint  Patient presents with   Chest Pain   HPI:  [name] is a 89 y.o. female with past medical history  of  hypertension, chronic diastolic heart failure, hypercholesterolemia, gout, hx of meningioma with previous gamma knife tx c/b seizures well controlled on topamax , paroxysmal atrial fibrillation on warfarin, CKD and tachy-brady syndrome s/p dual chamber pacemaker in 2010 with generator change in 2020.   Pt at bedside states she has had two episode of chest pain that started at home last night at rest she did take 2 nitroglycerin  sprays and that helped but she woke up again this morning and had an episode where she took 1 spray again and also once when she was in hospital. Pt is eating a regular diet and had salted peanut butter crackers while here. Daughter at bedside states that she eats salty foods.  Patient states the chest pain was left-sided and intermittent and currently resolved.  Also reports that her legs have been swollen she did have a fall about a month ago. Cardiology consulted urgently in the ED and saw patient due to her history and presentation of chest pain.   ED Course:  Vital signs in the ED were notable for the following:  Vitals:   08/11/24 1500 08/11/24 1530 08/11/24 1612 08/11/24 1921  BP:  (!) 162/59 (!) 170/72 (!) 157/51  Pulse:  (!) 59 67   Temp: 98.2 F (36.8 C)  97.9 F (36.6 C)   Resp:  18    SpO2:  100% 100%   TempSrc: Oral  Oral   >>ED evaluation thus far shows: BMP showed glucose 136 creatinine 1.73 anion gap of 16 LFTs  added on and pending. BNP 166.1, troponin of 8 and 7, INR pending.  >>While in the ED patient received the following: Medications  torsemide  (DEMADEX ) tablet 40 mg (has no administration  in time range)   Review of Systems  Cardiovascular:  Positive for chest pain.   Past Medical History:  Diagnosis Date   ACL (anterior cruciate ligament) rupture    Left   Acute MI (HCC)    Arthritis    Back pain    Secondary to arthritis   Benign paroxysmal positional vertigo 03/18/2016   Brain tumor [facility])    On right side. Removed in 1989   Bruit    Duplex doppler 03/28/05 Mildly abnormal. *Right subclavian artery: Less than 50% diameter reduction.   Chest pain    2D Echo 04/21/08 EF = >55%. Moderate tricuspid regurgitation. Persantine Myovoew stress test 04/21/08 EF = 81%, no evidence of ischemia noted.   Chronic anticoagulation    PAF   Chronic edema    Mild LE edema   Chronic kidney disease    Mild, stage 2   Coronary artery disease    Coronary atherosclerosis    Minimal   Dyslipidemia    GERD (gastroesophageal reflux disease)    Hyperlipidemia    On pravastatin   Memory difficulty 07/13/2014   Meningioma (HCC)    Resection in 1998   Mild depression    Ovarian cyst    Benign. Had a salpiingoophorectomy in 2005.   PAF (paroxysmal atrial fibrillation) (HCC)    On coumadin . St. Jude PPM (serial [id number]) inserted 2010   Paroxysmal atrial flutter (  HCC)    Seizure (HCC) 2006   Severe obesity (HCC)    Sick sinus syndrome (HCC)    Sinus node dysfunction (HCC)    S/P implantation of a dual-chamber permanent pacemaker in 2010   SOB (shortness of breath) on exertion    Class II. Catheterization 04/30/10 showed mild CAD with mildly elevated right heart pressures.    Tachycardia-bradycardia syndrome (HCC)    S/P implantation of dual-chamber permanent pacemaker in 2010   Trochlear nerve palsy    Right   Vertigo    Past Surgical History:  Procedure Laterality Date   Brain tumor removal  1989   CARDIAC CATHETERIZATION  04/30/10   Showed mild CAD with mildly elevated right heart pressures. (Dr. Leafy)   CRANIOTOMY  1989   (Brain tumor) With gamma knife procedure as well.    KNEE SURGERY     Bilateral, arthroscopic   PACEMAKER INSERTION  04/27/09   Implanted by Dr. Leafy. St. Jude Accent DR (serial [id number])   PPM GENERATOR CHANGEOUT N/A 09/12/2019   Procedure: PPM GENERATOR CHANGEOUT;  Surgeon: Francyne Headland, MD;  Location: [facility] INVASIVE CV LAB;  Service: Cardiovascular;  Laterality: N/A;    reports that she quit smoking about 16 years ago. Her smoking use included cigarettes. She started smoking about 36 years ago. She has never used smokeless tobacco. She reports that she does not drink alcohol and does not use drugs. Allergies  Allergen Reactions   Topamax  [Topiramate ] Other (See Comments)    Weird dreams Possible visual hallucinations   Dilantin [Phenytoin Sodium Extended] Hives   Luminal Sodium [Phenobarbital] Hives         Family History  Problem Relation Age of Onset   Stroke Mother    Heart disease Father    Heart disease Sister    Heart disease Sister    Heart disease Sister    Heart disease Brother    Heart disease Brother    Heart disease Brother    Seizures Neg Hx    Prior to Admission medications   Medication Sig Start Date End Date Taking? Authorizing Provider  allopurinol (ZYLOPRIM) 100 MG tablet Take 100 mg by mouth daily.   Yes [provider]  ARTIFICIAL TEAR OP Place 1 drop into both eyes 2 (two) times daily as needed (dry eyes).   Yes [provider]  colchicine  0.6 MG tablet Take 1 tablet (0.6 mg total) by mouth daily as needed (gout flare). 12/12/19  Yes Croitoru, Mihai, MD  gabapentin  (NEURONTIN ) 100 MG capsule Take 1 capsule (100 mg total) by mouth daily. 07/20/24  Yes Millikan, Megan, NP  gabapentin  (NEURONTIN ) 300 MG capsule Take 300 mg by mouth 2 (two) times daily as needed (headache, eye pain).   Yes [provider]  metoprolol  succinate (TOPROL -XL) 25 MG 24 hr tablet Take 25 mg by mouth daily. 02/02/24  Yes [provider]  nitroGLYCERIN  (NITROLINGUAL ) 0.4 MG/SPRAY spray Place 3 sprays  under the tongue every 5 (five) minutes x 3 doses as needed for chest pain. 05/20/18  Yes Croitoru, Mihai, MD  omeprazole (PRILOSEC) 20 MG capsule Take 20 mg by mouth daily.   Yes [provider]  potassium chloride  (KLOR-CON  M) 10 MEQ tablet TAKE 1 TABLET BY MOUTH EVERY DAY 04/14/24  Yes Croitoru, Mihai, MD  pravastatin (PRAVACHOL) 20 MG tablet Take 20 mg by mouth at bedtime.   Yes [provider]  torsemide  (DEMADEX ) 20 MG tablet TAKE 2 TABLETS (40 MG TOTAL) BY MOUTH DAILY.  Patient taking differently: Take 20-40 mg by mouth See admin instructions. Take 1 tablet (20mg ) by mouth daily. May take 2 tablets (40mg ) in the morning if needed for excessive swelling. 07/31/23  Yes Croitoru, Mihai, MD  traMADol  (ULTRAM ) 50 MG tablet Take 50 mg by mouth 2 (two) times daily as needed (back pain). 01/05/20  Yes [provider]  verapamil  (VERELAN ) 180 MG 24 hr capsule Take 1 capsule (180 mg total) by mouth at bedtime. 04/29/24  Yes Croitoru, Mihai, MD  warfarin (COUMADIN ) 4 MG tablet Take 4 mg by mouth at bedtime.   Yes [provider]                                                                                 Vitals:   08/11/24 1500 08/11/24 1530 08/11/24 1612 08/11/24 1921  BP:  (!) 162/59 (!) 170/72 (!) 157/51  Pulse:  (!) 59 67   Resp:  18    Temp: 98.2 F (36.8 C)  97.9 F (36.6 C)   TempSrc: Oral  Oral   SpO2:  100% 100%    Physical Exam Vitals reviewed.  Constitutional:      General: She is not in acute distress.    Appearance: She is not ill-appearing.  HENT:     Head: Normocephalic and atraumatic.  Eyes:     Extraocular Movements: Extraocular movements intact.  Cardiovascular:     Rate and Rhythm: Normal rate and regular rhythm.     Pulses: Normal pulses.     Heart sounds: Normal heart sounds.  Pulmonary:     Breath sounds: Normal breath sounds.  Abdominal:     General: There is no distension.     Palpations: Abdomen is soft.     Tenderness: There  is no abdominal tenderness.  Musculoskeletal:     Right lower leg: Edema present.     Left lower leg: Edema present.  Neurological:     General: No focal deficit present.     Mental Status: She is alert and oriented to person, place, and time.     Labs on Admission: I have personally reviewed following labs and imaging studies CBC: Recent Labs  Lab 08/11/24 1041  WBC 6.1  NEUTROABS 3.9  HGB 9.4*  HCT 33.0*  MCV 78.6*  PLT 269   Basic Metabolic Panel: Recent Labs  Lab 08/11/24 1041  NA 145  K 3.6  CL 106  CO2 23  GLUCOSE 136*  BUN 21  CREATININE 1.73*  CALCIUM 9.9   GFR: CrCl cannot be calculated (Unknown ideal weight.). Liver Function Tests: No results for input(s): AST, ALT, ALKPHOS, BILITOT, PROT, ALBUMIN in the last 168 hours. No results for input(s): LIPASE, AMYLASE in the last 168 hours. No results for input(s): AMMONIA in the last 168 hours. Recent Labs    02/19/24 0923 08/11/24 1041  BUN 24* 21  CREATININE 1.48* 1.73*    CrCl cannot be calculated (Unknown ideal weight.).   Recent Labs    02/19/24 0923 08/11/24 1041  BUN 24* 21  CREATININE 1.48* 1.73*  CO2 18* 23   Cardiac Enzymes: No results for input(s): CKTOTAL, CKMB, CKMBINDEX, TROPONINI in the last 168  hours. BNP (last 3 results) No results for input(s): PROBNP in the last 8760 hours. HbA1C: Recent Labs    08/11/24 1024  HGBA1C 5.5   CBG: No results for input(s): GLUCAP in the last 168 hours. Lipid Profile: No results for input(s): CHOL, HDL, LDLCALC, TRIG, CHOLHDL, LDLDIRECT in the last 72 hours. Thyroid Function Tests: Recent Labs    08/11/24 1041 08/11/24 1330  TSH 2.201  --   FREET4  --  0.81   Anemia Panel: No results for input(s): VITAMINB12, FOLATE, FERRITIN, TIBC, IRON, RETICCTPCT in the last 72 hours. Urine analysis:    Component Value Date/Time   COLORURINE YELLOW 03/30/2020 2208   APPEARANCEUR CLEAR  03/30/2020 2208   LABSPEC 1.016 03/30/2020 2208   PHURINE 5.0 03/30/2020 2208   GLUCOSEU NEGATIVE 03/30/2020 2208   HGBUR NEGATIVE 03/30/2020 2208   BILIRUBINUR NEGATIVE 03/30/2020 2208   KETONESUR NEGATIVE 03/30/2020 2208   PROTEINUR NEGATIVE 03/30/2020 2208   UROBILINOGEN 0.2 04/23/2009 2047   NITRITE NEGATIVE 03/30/2020 2208   LEUKOCYTESUR NEGATIVE 03/30/2020 2208   Radiological Exams on Admission: DG Chest Port 1 View Result Date: 08/11/2024 CLINICAL DATA:  Chest pain. EXAM: PORTABLE CHEST 1 VIEW COMPARISON:  03/30/2020. FINDINGS: The heart size and mediastinal contours are within normal limits. Left-sided dual lead pacemaker with leads overlying the right atrium and right ventricle. Aortic atherosclerosis. Minimal linear scarring/atelectasis in the left mid to lower lung zone, similar to the prior exam. No focal consolidation, pleural effusion, or pneumothorax. Advanced degenerative changes of the bilateral glenohumeral joints. IMPRESSION: No acute cardiopulmonary findings. Electronically Signed   By: Harrietta Sherry M.D.   On: 08/11/2024 10:38   Data Reviewed: Relevant notes from primary care and specialist visits, past discharge summaries as available in EHR, including Care Everywhere . Prior diagnostic testing as pertinent to current admission diagnoses, Updated medications and problem lists for reconciliation .ED course, including vitals, labs, imaging, treatment and response to treatment,Triage notes, nursing and pharmacy notes and ED provider's notes.Notable results as noted in HPI.Discussed case with EDMD/ ED APP/ or Specialty MD on call and as needed.  Assessment & Plan  >> Chest pain/unstable angina/history of CAD: Continue patient on statin therapy will continue her Coumadin , as needed nitroglycerin , additional measures per cardiology consult.  As needed nitroglycerin / morphine .   >> Chronic diastolic congestive heart failure: Clinically patient does have bilateral lower  extremity edema with her elevated BNP we will continue her torsemide  per cardiology recommendation at home dose.  Daily weights.  And sodium restricted diet.  Dietary education on low-sodium diet.   >> Essential hypertension: Vitals:   08/11/24 1130 08/11/24 1145 08/11/24 1200 08/11/24 1215  BP: (!) 158/76 (!) 157/65 (!) 164/70 (!) 150/75   08/11/24 1230 08/11/24 1245 08/11/24 1300 08/11/24 1315  BP: (!) 159/68 (!) 158/64 (!) 170/67 (!) 154/48   08/11/24 1330 08/11/24 1530 08/11/24 1612 08/11/24 1921  BP: (!) 169/73 (!) 162/59 (!) 170/72 (!) 157/51  Cont toprol  daily in am and verapamil  at bedtime.    >> Paroxysmal atrial fibrillation: Currently she is in sinus rhythm, continue patient's Toprol  and verapamil . CHA2DS2/VAS Stroke Risk Points  Current as of 3 minutes ago     6 >= 2 Points: High Risk  We will therefore consult pharmacy for coumadin .     >> History of seizures: Aspiration precaution seizure precaution fall precaution.  No AEDs in chart. Per neurology note dated July 30th 2025, Patient does have a history of meningioma and seizures with no seizure  events. Would avoid tramadol  for pain on d/c.   >> GERD: Aspiration precaution and IV PPI.   >>Anemia:    Latest Ref Rng & Units 08/11/2024   10:41 AM 02/19/2024    9:23 AM 03/11/2022    9:11 AM  CBC  WBC 4.0 - 10.5 K/uL 6.1  5.9  4.5   Hemoglobin 12.0 - 15.0 g/dL 9.4  9.9  87.4   Hematocrit 36.0 - 46.0 % 33.0  34.3  40.6   Platelets 150 - 400 K/uL 269  220  193   New and intermittent and currently stable. Will defer to PCP to follow with routine and frequent cbc's q2 monthly as pt is on anticoagulation with coumadin . Type/screen/ occult.IV PPI.    >>CKD Stage 3b-4: Lab Results  Component Value Date   CREATININE 1.73 (H) 08/11/2024   CREATININE 1.48 (H) 02/19/2024   CREATININE 1.44 (H) 03/11/2022  We will monitor pt and avoid contrast and take pretreatment measures in event of contrast study unless emergency.     DVT prophylaxis:  Coumadin .  Consults:  Cardiology.   Advance Care Planning:    Code Status: Full Code   Family Communication:  Daughter. Disposition Plan:  Home.  Severity of Illness: The appropriate patient status for this patient is OBSERVATION. Observation status is judged to be reasonable and necessary in order to provide the required intensity of service to ensure the patient's safety. The patient's presenting symptoms, physical exam findings, and initial radiographic and laboratory data in the context of their medical condition is felt to place them at decreased risk for further clinical deterioration. Furthermore, it is anticipated that the patient will be medically stable for discharge from the hospital within 2 midnights of admission.   Unresulted Labs (From admission, onward)     Start     Ordered   08/12/24 0500  Protime-INR  Daily,   R      08/11/24 1635   08/12/24 0500  Comprehensive metabolic panel with GFR  Tomorrow morning,   R        08/11/24 1905   08/12/24 0500  CBC with Differential/Platelet  Tomorrow morning,   R        08/11/24 1905   08/12/24 0500  Magnesium  Tomorrow morning,   R        08/11/24 1905   08/12/24 0500  Phosphorus  Tomorrow morning,   R        08/11/24 1905   08/12/24 0500  Occult blood card to lab, stool  Daily,   R      08/11/24 1925   08/11/24 1926  Type and screen  Once,   R        08/11/24 1925   08/11/24 1907  Hepatic function panel  Add-on,   AD        08/11/24 1915   08/11/24 1635  Protime-INR  ONCE - STAT,   STAT        08/11/24 1634            Meds ordered this encounter  Medications   torsemide  (DEMADEX ) tablet 40 mg   sodium chloride  flush (NS) 0.9 % injection 3 mL   OR Linked Order Group    acetaminophen  (TYLENOL ) tablet 650 mg    acetaminophen  (TYLENOL ) suppository 650 mg   morphine  (PF) 2 MG/ML injection 2 mg   polyethylene glycol (MIRALAX  / GLYCOLAX ) packet 17 g   hydrALAZINE  (APRESOLINE ) injection 5 mg    gabapentin  (NEURONTIN ) capsule  100 mg   DISCONTD: torsemide  (DEMADEX ) tablet 20 mg   torsemide  (DEMADEX ) tablet 20 mg   DISCONTD: heparin  injection 5,000 Units   verapamil  (CALAN -SR) CR tablet 180 mg   metoprolol  succinate (TOPROL -XL) 24 hr tablet 25 mg   pantoprazole  (PROTONIX ) injection 40 mg     Orders Placed This Encounter  Procedures   DG Chest Port 1 View   Basic metabolic panel   CBC with Differential   Brain natriuretic peptide   Hemoglobin A1c   TSH   T4, free   Protime-INR   Protime-INR   Comprehensive metabolic panel with GFR   CBC with Differential/Platelet   Magnesium   Phosphorus   Hepatic function panel   Occult blood card to lab, stool   Diet 2 gram sodium Room service appropriate? Yes; Fluid consistency: Thin   Weigh patient   Initiate Heart Failure Care Plan   Daily weights   Strict intake and output   In and Out Cath   Patient Education:   Apply Heart Failure Care Plan   [facility] and [facility] only) Obtain REDS clips reading Every morning   Cardiac Monitoring Continuous x 24 hours Indications for use: Sub-acute heart failure   Maintain IV access   Vital signs   Notify physician (specify)   Mobility Protocol: No Restrictions   Refer to Sidebar Report Mobility Protocol for Adult Inpatient   Initiate Adult Central Line Maintenance and Catheter Protocol for patients with central line (CVC, PICC, Port, Hemodialysis, Trialysis)   If patient diabetic or glucose greater than 140 notify physician for Sliding Scale Insulin Orders   Initiate CHG Protocol   Do not place and if present remove PureWick   Initiate Oral Care Protocol   Initiate Carrier Fluid Protocol   RN may order General Admission PRN Orders utilizing General Admission PRN medications (through manage orders) for the following patient needs: allergy symptoms (Claritin), cold sores (Carmex), cough (Robitussin DM), eye irritation (Liquifilm Tears), hemorrhoids (Tucks), indigestion (Maalox), minor skin  irritation (Hydrocortisone Cream), muscle pain (Ben Gay), nose irritation (saline nasal spray) and sore throat (Chloraseptic spray).   Ambulate with assistance   Notify physician (specify)   Provide patient education materials   Full code   Inpatient consult to Cardiology   Consult to hospitalist   Inpatient consult to Cardiology Consult Timeframe: ROUTINE - requires response within 24 hours; Reason for Consult? CHF Already called   Consult to Transition of Care Team   Consult to Heart Failure Navigation Team [facility], and [facility])   Nutritional services consult   warfarin (COUMADIN ) per pharmacy consult   OT eval and treat   PT eval and treat   Pulse oximetry check with vital signs   Oxygen therapy Mode or (Route): Nasal cannula; Liters Per Minute: 2; Keep O2 saturation between: greater than 92 %   Incentive spirometry   EKG 12-Lead   Type and screen   Insert peripheral IV   Place in observation (patient's expected length of stay will be less than 2 midnights)   Aspiration precautions   Fall precautions   Seizure precautions    Author: Mario LULLA Blanch, MD 12 pm- 8 pm. [facility]. 08/11/2024 7:25 PM Please note for any communication after hours contact [facility] Assigned provider on call on Amion.

## 2024-08-11 NOTE — ED Notes (Signed)
 Cardiology at bedside.

## 2024-08-11 NOTE — ED Provider Notes (Signed)
 [place] [facility]ERGENC[facility] DEPARTMENT AT [facility] Provider Note   CSN: [id number] Arrival date & time: 08/11/24  1001     Patient presents with: Chest Pain   [name] is a 89 y.o. female.   [name] is a 89 y.o. female of CAD, CHF, hypertension, hyperlipidemia, A-fib on warfarin, tachybradycardia syndrome s/p pacemaker, who presents to the emergency department via EMS for evaluation of chest pain.  Patient reports that she had an episode of chest pain last night that began with rest present on the left side of the chest without radiation.  Took 2 sprays of nitroglycerin  with relief.  Woke up this morning and again experienced similar chest pain described as a sharp pain on the left side of her chest.  Took 1 spray of nitroglycerin  and the sharp pain was resolved but she has continued to have a general heavy sensation in her chest.  She denies any neck arm or jaw pain.  No nausea, vomiting or diaphoresis.  She denies any shortness of breath.  She does note that her legs have been more swollen over the past few days than normal.  She has been taking her torsemide  but swelling has not resolved.  Reports that she has been taking all of her other medications regularly.  Followed by Dr. Francyne with cardiology.  The history is provided by the patient, medical records and a relative.  Chest Pain Associated symptoms: no fever and no shortness of breath        Prior to Admission medications   Medication Sig Start Date End Date Taking? Authorizing Provider  allopurinol (ZYLOPRIM) 100 MG tablet Take 100 mg by mouth daily.   Yes [provider]  ARTIFICIAL TEAR OP Place 1 drop into both eyes 2 (two) times daily as needed (dry eyes).   Yes [provider]  colchicine  0.6 MG tablet Take 1 tablet (0.6 mg total) by mouth daily as needed (gout flare). 12/12/19  Yes Croitoru, Mihai, MD  gabapentin  (NEURONTIN ) 100 MG capsule Take 1 capsule (100 mg total) by mouth  daily. 07/20/24  Yes Millikan, Megan, NP  gabapentin  (NEURONTIN ) 300 MG capsule Take 300 mg by mouth 2 (two) times daily as needed (headache, eye pain).   Yes [provider]  metoprolol  succinate (TOPROL -XL) 25 MG 24 hr tablet Take 25 mg by mouth daily. 02/02/24  Yes [provider]  nitroGLYCERIN  (NITROLINGUAL ) 0.4 MG/SPRAY spray Place 3 sprays under the tongue every 5 (five) minutes x 3 doses as needed for chest pain. 05/20/18  Yes Croitoru, Mihai, MD  omeprazole (PRILOSEC) 20 MG capsule Take 20 mg by mouth daily.   Yes [provider]  potassium chloride  (KLOR-CON  M) 10 MEQ tablet TAKE 1 TABLET BY MOUTH EVERY DAY 04/14/24  Yes Croitoru, Mihai, MD  pravastatin (PRAVACHOL) 20 MG tablet Take 20 mg by mouth at bedtime.   Yes [provider]  torsemide  (DEMADEX ) 20 MG tablet TAKE 2 TABLETS (40 MG TOTAL) BY MOUTH DAILY. Patient taking differently: Take 20-40 mg by mouth See admin instructions. Take 1 tablet (20mg ) by mouth daily. May take 2 tablets (40mg ) in the morning if needed for excessive swelling. 07/31/23  Yes Croitoru, Mihai, MD  traMADol  (ULTRAM ) 50 MG tablet Take 50 mg by mouth 2 (two) times daily as needed (back pain). 01/05/20  Yes [provider]  verapamil  (VERELAN ) 180 MG 24 hr capsule Take 1 capsule (180 mg total) by mouth at bedtime. 04/29/24  Yes Croitoru, Mihai,  MD  warfarin (COUMADIN ) 4 MG tablet Take 4 mg by mouth at bedtime.   Yes [provider]    Allergies: Topamax  [topiramate ], Dilantin [phenytoin sodium extended], and Luminal sodium [phenobarbital]    Review of Systems  Constitutional:  Negative for chills and fever.  Respiratory:  Negative for shortness of breath.   Cardiovascular:  Positive for chest pain and leg swelling.    Updated Vital Signs BP (!) 157/96   Pulse 63   Temp 98 F (36.7 C)   Resp 11   SpO2 100%   Physical Exam Vitals and nursing note reviewed.  Constitutional:      General: She is not in acute  distress.    Appearance: Normal appearance. She is well-developed. She is not diaphoretic.     Comments: Elderly female, alert and in no acute distress  HENT:     Head: Normocephalic and atraumatic.  Eyes:     General:        Right eye: No discharge.        Left eye: No discharge.     Pupils: Pupils are equal, round, and reactive to light.  Cardiovascular:     Rate and Rhythm: Normal rate and regular rhythm.     Pulses: Normal pulses.          Radial pulses are 2+ on the right side and 2+ on the left side.       Dorsalis pedis pulses are 2+ on the right side and 2+ on the left side.     Heart sounds: Normal heart sounds. No murmur heard.    No friction rub. No gallop.  Pulmonary:     Effort: Pulmonary effort is normal. No respiratory distress.     Breath sounds: Normal breath sounds. No wheezing or rales.     Comments: Respirations equal and unlabored, patient able to speak in full sentences, lungs clear to auscultation bilaterally with slightly decreased breath sounds in bilateral bases Abdominal:     General: Bowel sounds are normal. There is no distension.     Palpations: Abdomen is soft. There is no mass.     Tenderness: There is no abdominal tenderness. There is no guarding.     Comments: Abdomen soft, nondistended, nontender to palpation in all quadrants without guarding or peritoneal signs  Musculoskeletal:        General: No deformity.     Cervical back: Neck supple.     Right lower leg: Edema present.     Left lower leg: Edema present.     Comments: 2+ edema to the mid shins and bilateral lower extremities  Skin:    General: Skin is warm and dry.     Capillary Refill: Capillary refill takes less than 2 seconds.  Neurological:     Mental Status: She is alert and oriented to person, place, and time.     Coordination: Coordination normal.     Comments: Speech is clear, able to follow commands Moves extremities without ataxia, coordination intact  Psychiatric:         Mood and Affect: Mood normal.        Behavior: Behavior normal.     (all labs ordered are listed, but only abnormal results are displayed) Labs Reviewed  BASIC METABOLIC PANEL WITH GFR - Abnormal; Notable for the following components:      Result Value   Glucose, Bld 136 (*)    Creatinine, Ser 1.73 (*)    GFR, Estimated 28 (*)  Anion gap 16 (*)    All other components within normal limits  CBC WITH DIFFERENTIAL/PLATELET - Abnormal; Notable for the following components:   Hemoglobin 9.4 (*)    HCT 33.0 (*)    MCV 78.6 (*)    MCH 22.4 (*)    MCHC 28.5 (*)    RDW 18.4 (*)    All other components within normal limits  BRAIN NATRIURETIC PEPTIDE - Abnormal; Notable for the following components:   B Natriuretic Peptide 166.1 (*)    All other components within normal limits  PROTIME-INR - Abnormal; Notable for the following components:   Prothrombin Time 30.9 (*)    INR 2.8 (*)    All other components within normal limits  PROTIME-INR - Abnormal; Notable for the following components:   Prothrombin Time 31.5 (*)    INR 2.9 (*)    All other components within normal limits  COMPREHENSIVE METABOLIC PANEL WITH GFR - Abnormal; Notable for the following components:   Creatinine, Ser 1.62 (*)    Total Protein 5.6 (*)    Albumin 2.9 (*)    GFR, Estimated 30 (*)    All other components within normal limits  CBC WITH DIFFERENTIAL/PLATELET - Abnormal; Notable for the following components:   RBC 3.85 (*)    Hemoglobin 8.5 (*)    HCT 29.3 (*)    MCV 76.1 (*)    MCH 22.1 (*)    MCHC 29.0 (*)    RDW 18.3 (*)    All other components within normal limits  HEPATIC FUNCTION PANEL - Abnormal; Notable for the following components:   Total Protein 5.9 (*)    Albumin 3.1 (*)    All other components within normal limits  CBC - Abnormal; Notable for the following components:   RBC 3.69 (*)    Hemoglobin 8.2 (*)    HCT 28.4 (*)    MCV 77.0 (*)    MCH 22.2 (*)    MCHC 28.9 (*)    RDW 18.3  (*)    All other components within normal limits  SEDIMENTATION RATE - Abnormal; Notable for the following components:   Sed Rate 39 (*)    All other components within normal limits  BASIC METABOLIC PANEL WITH GFR - Abnormal; Notable for the following components:   Creatinine, Ser 1.55 (*)    GFR, Estimated 32 (*)    Anion gap 4 (*)    All other components within normal limits  PROTIME-INR - Abnormal; Notable for the following components:   Prothrombin Time 34.6 (*)    INR 3.2 (*)    All other components within normal limits  FERRITIN - Abnormal; Notable for the following components:   Ferritin 8 (*)    All other components within normal limits  IRON  AND TIBC - Abnormal; Notable for the following components:   Iron  15 (*)    Saturation Ratios 4 (*)    All other components within normal limits  HEMOGLOBIN A1C  TSH  T4, FREE  MAGNESIUM  PHOSPHORUS  C-REACTIVE PROTEIN  CK  TYPE AND SCREEN  TROPONIN I (HIGH SENSITIVITY)  TROPONIN I (HIGH SENSITIVITY)    EKG: EKG Interpretation Date/Time:  Thursday August 11 2024 10:09:07 EDT Ventricular Rate:  63 PR Interval:  183 QRS Duration:  88 QT Interval:  412 QTC Calculation: 422 R Axis:   41  Text Interpretation: Sinus rhythm Confirmed by Ula Barter [phone]) on 08/11/2024 10:12:15 AM  Radiology:  [facility] Chest Port 1 View Result  Date: 08/11/2024 CLINICAL DATA:  Chest pain. EXAM: PORTABLE CHEST 1 VIEW COMPARISON:  03/30/2020. FINDINGS: The heart size and mediastinal contours are within normal limits. Left-sided dual lead pacemaker with leads overlying the right atrium and right ventricle. Aortic atherosclerosis. Minimal linear scarring/atelectasis in the left mid to lower lung zone, similar to the prior exam. No focal consolidation, pleural effusion, or pneumothorax. Advanced degenerative changes of the bilateral glenohumeral joints. IMPRESSION: No acute cardiopulmonary findings. Electronically Signed   By: Harrietta Sherry M.D.   On:  08/11/2024 10:38      Procedures   Medications Ordered in the ED  torsemide  (DEMADEX ) tablet 40 mg (40 mg Oral Given 08/11/24 1512)                                    Medical Decision Making Amount and/or Complexity of Data Reviewed Labs: ordered. Radiology: ordered.  Risk Prescription drug management. Decision regarding hospitalization.   Patient presents to the emergency department with chest pain. Patient nontoxic appearing, in no apparent distress, vitals without significant abnormality. Fairly benign physical exam, but does have increased LE edema bilaterally.   DDX including but not limited to: ACS, pulmonary embolism, dissection, pneumothorax, pneumonia, arrhythmia, severe anemia, MSK, GERD, anxiety, abdominal process.   Additional history obtained:  Chart & nursing note reviewed.  History obtained from daughter at bedside.  EKG: NSR w/o ischemic changes with a history  Lab Tests:  I reviewed & interpreted labs including:  Anemia with hemoglobin of 9.4, hemoglobin similar to 5 months ago, no leukocytosis and normal platelet count, no significant electrolyte derangements, creatinine of 1.73 slightly increased from baseline of 1.4, troponin elevated, BNP minimally elevated at 166.1  Imaging Studies ordered:  I ordered and viewed the following imaging, agree with radiologist impression:  Chest x-ray without acute cardiopulmonary disease  ED Course:  Patient with complex cardiac history now having chest pain at rest, fortunately relieved with nitro, while cardiac enzyme and EKG do not show signs of current MI given patient's multiple episodes of chest pain as well as lower extremity edema will discuss case with cardiology.  Cardiology has seen and evaluated the patient, request observation admission to hospitalist service for continued trending of troponins and they will consider stress testing as well.  Recommend continuing home torsemide  dosing.  Case discussed with  Dr. Mario Blanch and [facility] hospitalist will see and admit the patient   Portions of this note were generated with Dragon dictation software. Dictation errors may occur despite best attempts at proofreading.      Final diagnoses:  Left-sided chest pain    ED Discharge Orders          [name] 08/16/24 2258    Ula Prentice SAUNDERS, MD 08/17/24 [phone]

## 2024-08-11 NOTE — Progress Notes (Signed)
 PHARMACY - ANTICOAGULATION CONSULT NOTE  Pharmacy Consult for Warfarin Indication: atrial fibrillation  Allergies  Allergen Reactions   Topamax  [Topiramate ] Other (See Comments)    Weird dreams Possible visual hallucinations   Dilantin [Phenytoin Sodium Extended] Hives   Luminal Sodium [Phenobarbital] Hives          Patient Measurements:    Vital Signs: Temp: 97.9 F (36.6 C) (08/21 1612) Temp Source: Oral (08/21 1612) BP: 170/72 (08/21 1612) Pulse Rate: 67 (08/21 1612)  Labs: Recent Labs    08/11/24 1041 08/11/24 1213  HGB 9.4*  --   HCT 33.0*  --   PLT 269  --   CREATININE 1.73*  --   TROPONINIHS 8 7    CrCl cannot be calculated (Unknown ideal weight.).   Medical History: Past Medical History:  Diagnosis Date   ACL (anterior cruciate ligament) rupture    Left   Acute MI (HCC)    Arthritis    Back pain    Secondary to arthritis   Benign paroxysmal positional vertigo 03/18/2016   Brain tumor [facility])    On right side. Removed in 1989   Bruit    Duplex doppler 03/28/05 Mildly abnormal. *Right subclavian artery: Less than 50% diameter reduction.   Chest pain    2D Echo 04/21/08 EF = >55%. Moderate tricuspid regurgitation. Persantine Myovoew stress test 04/21/08 EF = 81%, no evidence of ischemia noted.   Chronic anticoagulation    PAF   Chronic edema    Mild LE edema   Chronic kidney disease    Mild, stage 2   Coronary artery disease    Coronary atherosclerosis    Minimal   Dyslipidemia    GERD (gastroesophageal reflux disease)    Hyperlipidemia    On pravastatin   Memory difficulty 07/13/2014   Meningioma (HCC)    Resection in 1998   Mild depression    Ovarian cyst    Benign. Had a salpiingoophorectomy in 2005.   PAF (paroxysmal atrial fibrillation) (HCC)    On coumadin . [employer] PPM (serial [phone]) inserted 2010   Paroxysmal atrial flutter (HCC)    Seizure (HCC) 2006   Severe obesity (HCC)    Sick sinus syndrome (HCC)    Sinus node dysfunction  (HCC)    S/P implantation of a dual-chamber permanent pacemaker in 2010   SOB (shortness of breath) on exertion    Class II. Catheterization 04/30/10 showed mild CAD with mildly elevated right heart pressures.    Tachycardia-bradycardia syndrome (HCC)    S/P implantation of dual-chamber permanent pacemaker in 2010   Trochlear nerve palsy    Right   Vertigo     Medications:  Scheduled:   gabapentin   100 mg Oral Daily   sodium chloride  flush  3 mL Intravenous Q12H   [START ON 08/12/2024] torsemide   20 mg Oral BID   Infusions:  PRN: acetaminophen  **OR** acetaminophen , hydrALAZINE , morphine  injection, polyethylene glycol  Assessment: 89 yo female on chronic warfarin for hx afib. Home dose reported as 4mg  daily - last taken 8/20. INR on admit is therapeutic at 2.8  Goal of Therapy:  INR 2-3   Plan:  Warfarin 4mg  PO x 1 tonight (home dose) Daily PT/INR Monitor for signs/symptoms of bleeding  Rocky Slade, PharmD, BCPS 08/11/2024,7:33 PM

## 2024-08-11 NOTE — ED Notes (Signed)
Help get patient into a gown on the monitor did EKG shown to er provider patient is resting with call bell in reach  

## 2024-08-12 ENCOUNTER — Observation Stay (HOSPITAL_COMMUNITY)

## 2024-08-12 DIAGNOSIS — R079 Chest pain, unspecified: Secondary | ICD-10-CM

## 2024-08-12 DIAGNOSIS — I200 Unstable angina: Secondary | ICD-10-CM | POA: Diagnosis not present

## 2024-08-12 DIAGNOSIS — I5032 Chronic diastolic (congestive) heart failure: Secondary | ICD-10-CM | POA: Diagnosis not present

## 2024-08-12 DIAGNOSIS — R0789 Other chest pain: Secondary | ICD-10-CM | POA: Diagnosis not present

## 2024-08-12 LAB — CBC WITH DIFFERENTIAL/PLATELET
Abs Immature Granulocytes: 0.02 K/uL (ref 0.00–0.07)
Basophils Absolute: 0.1 K/uL (ref 0.0–0.1)
Basophils Relative: 1 %
Eosinophils Absolute: 0.1 K/uL (ref 0.0–0.5)
Eosinophils Relative: 1 %
HCT: 29.3 % — ABNORMAL LOW (ref 36.0–46.0)
Hemoglobin: 8.5 g/dL — ABNORMAL LOW (ref 12.0–15.0)
Immature Granulocytes: 0 %
Lymphocytes Relative: 36 %
Lymphs Abs: 2.4 K/uL (ref 0.7–4.0)
MCH: 22.1 pg — ABNORMAL LOW (ref 26.0–34.0)
MCHC: 29 g/dL — ABNORMAL LOW (ref 30.0–36.0)
MCV: 76.1 fL — ABNORMAL LOW (ref 80.0–100.0)
Monocytes Absolute: 0.7 K/uL (ref 0.1–1.0)
Monocytes Relative: 10 %
Neutro Abs: 3.3 K/uL (ref 1.7–7.7)
Neutrophils Relative %: 52 %
Platelets: 265 K/uL (ref 150–400)
RBC: 3.85 MIL/uL — ABNORMAL LOW (ref 3.87–5.11)
RDW: 18.3 % — ABNORMAL HIGH (ref 11.5–15.5)
WBC: 6.5 K/uL (ref 4.0–10.5)
nRBC: 0 % (ref 0.0–0.2)

## 2024-08-12 LAB — COMPREHENSIVE METABOLIC PANEL WITH GFR
ALT: 17 U/L (ref 0–44)
AST: 23 U/L (ref 15–41)
Albumin: 2.9 g/dL — ABNORMAL LOW (ref 3.5–5.0)
Alkaline Phosphatase: 73 U/L (ref 38–126)
Anion gap: 7 (ref 5–15)
BUN: 18 mg/dL (ref 8–23)
CO2: 26 mmol/L (ref 22–32)
Calcium: 9.5 mg/dL (ref 8.9–10.3)
Chloride: 109 mmol/L (ref 98–111)
Creatinine, Ser: 1.62 mg/dL — ABNORMAL HIGH (ref 0.44–1.00)
GFR, Estimated: 30 mL/min — ABNORMAL LOW (ref >60)
Glucose, Bld: 90 mg/dL (ref 70–99)
Potassium: 3.7 mmol/L (ref 3.5–5.1)
Sodium: 142 mmol/L (ref 135–145)
Total Bilirubin: 0.5 mg/dL (ref 0.0–1.2)
Total Protein: 5.6 g/dL — ABNORMAL LOW (ref 6.5–8.1)

## 2024-08-12 LAB — MAGNESIUM: Magnesium: 2 mg/dL (ref 1.7–2.4)

## 2024-08-12 LAB — PROTIME-INR
INR: 2.9 — ABNORMAL HIGH (ref 0.8–1.2)
Prothrombin Time: 31.5 s — ABNORMAL HIGH (ref 11.4–15.2)

## 2024-08-12 LAB — ECHOCARDIOGRAM COMPLETE
Area-P 1/2: 3.08 cm2
S' Lateral: 2.8 cm
Weight: 3622.4 [oz_av]

## 2024-08-12 LAB — PHOSPHORUS: Phosphorus: 3.1 mg/dL (ref 2.5–4.6)

## 2024-08-12 MED ORDER — GABAPENTIN 300 MG PO CAPS
300.0000 mg | ORAL_CAPSULE | Freq: Once | ORAL | Status: AC
  Administered 2024-08-12: 300 mg via ORAL
  Filled 2024-08-12: qty 1

## 2024-08-12 MED ORDER — PANTOPRAZOLE SODIUM 40 MG PO TBEC
40.0000 mg | DELAYED_RELEASE_TABLET | Freq: Every day | ORAL | Status: DC
  Administered 2024-08-12 – 2024-08-13 (×2): 40 mg via ORAL
  Filled 2024-08-12 (×2): qty 1

## 2024-08-12 MED ORDER — GABAPENTIN 100 MG PO CAPS
100.0000 mg | ORAL_CAPSULE | Freq: Every day | ORAL | Status: DC
  Administered 2024-08-13: 100 mg via ORAL
  Filled 2024-08-12: qty 1

## 2024-08-12 MED ORDER — PNEUMOCOCCAL 20-VAL CONJ VACC 0.5 ML IM SUSY: 0.5000 mL | PREFILLED_SYRINGE | INTRAMUSCULAR | Status: DC

## 2024-08-12 MED ORDER — WARFARIN SODIUM 2 MG PO TABS
4.0000 mg | ORAL_TABLET | Freq: Every day | ORAL | Status: DC
  Administered 2024-08-12: 4 mg via ORAL
  Filled 2024-08-12: qty 2

## 2024-08-12 MED ORDER — ENSURE PLUS HIGH PROTEIN PO LIQD
237.0000 mL | Freq: Two times a day (BID) | ORAL | Status: DC
  Administered 2024-08-13 (×2): 237 mL via ORAL

## 2024-08-12 NOTE — Plan of Care (Signed)

## 2024-08-12 NOTE — Progress Notes (Signed)
 Rounding Note   Patient Name: [name] Date of Encounter: 08/12/2024  [facility] Cardiologist: Jerel Balding, MD   Subjective  No CP or dyspnea  Scheduled Meds:  gabapentin   100 mg Oral Daily   metoprolol  succinate  25 mg Oral Daily   pantoprazole  (PROTONIX ) IV  40 mg Intravenous Q12H   sodium chloride  flush  3 mL Intravenous Q12H   torsemide   20 mg Oral BID   verapamil   180 mg Oral QHS   Warfarin - Pharmacist Dosing Inpatient   Does not apply q1600   Continuous Infusions:  PRN Meds: acetaminophen  **OR** acetaminophen , hydrALAZINE , morphine  injection, polyethylene glycol   Vital Signs  Vitals:   08/11/24 2300 08/11/24 2341 08/12/24 0243 08/12/24 0456  BP:  (!) 149/64  (!) 146/57  Pulse: 68 63  64  Resp: 19 16  16   Temp:  98.5 F (36.9 C)  98.2 F (36.8 C)  TempSrc:  Oral  Oral  SpO2: 94% 96%  95%  Weight:   102.7 kg     Intake/Output Summary (Last 24 hours) at 08/12/2024 0729 Last data filed at 08/11/2024 1955 Gross per 24 hour  Intake --  Output 200 ml  Net -200 ml      08/12/2024    2:43 AM 07/20/2024   11:12 AM 05/02/2024    1:36 PM  Last 3 Weights  Weight (lbs) 226 lb 6.4 oz 221 lb 6.4 oz 214 lb  Weight (kg) 102.694 kg 100.426 kg 97.07 kg      Telemetry Sinus - Personally Reviewed   Physical Exam  GEN: No acute distress.   Neck: No JVD Cardiac: RRR Respiratory: Clear to auscultation bilaterally. GI: Soft, nontender, non-distended  MS: trace edema Neuro:  Nonfocal  Psych: Normal affect   Labs High Sensitivity Troponin:   Recent Labs  Lab 08/11/24 1041 08/11/24 1213  TROPONINIHS 8 7     Chemistry Recent Labs  Lab 08/11/24 1041 08/11/24 2044 08/12/24 0540  NA 145  --  142  K 3.6  --  3.7  CL 106  --  109  CO2 23  --  26  GLUCOSE 136*  --  90  BUN 21  --  18  CREATININE 1.73*  --  1.62*  CALCIUM 9.9  --  9.5  MG  --   --  2.0  PROT  --  5.9* 5.6*  ALBUMIN  --  3.1* 2.9*  AST  --  25 23  ALT  --  18 17   ALKPHOS  --  85 73  BILITOT  --  0.4 0.5  GFRNONAA 28*  --  30*  ANIONGAP 16*  --  7     Hematology Recent Labs  Lab 08/11/24 1041 08/12/24 0540  WBC 6.1 6.5  RBC 4.20 3.85*  HGB 9.4* 8.5*  HCT 33.0* 29.3*  MCV 78.6* 76.1*  MCH 22.4* 22.1*  MCHC 28.5* 29.0*  RDW 18.4* 18.3*  PLT 269 265   Thyroid  Recent Labs  Lab 08/11/24 1041 08/11/24 1330  TSH 2.201  --   FREET4  --  0.81    BNP Recent Labs  Lab 08/11/24 1041  BNP 166.1*    Radiology  DG Chest Port 1 View Result Date: 08/11/2024 CLINICAL DATA:  Chest pain. EXAM: PORTABLE CHEST 1 VIEW COMPARISON:  03/30/2020. FINDINGS: The heart size and mediastinal contours are within normal limits. Left-sided dual lead pacemaker with leads overlying the right atrium and right ventricle. Aortic atherosclerosis.  Minimal linear scarring/atelectasis in the left mid to lower lung zone, similar to the prior exam. No focal consolidation, pleural effusion, or pneumothorax. Advanced degenerative changes of the bilateral glenohumeral joints. IMPRESSION: No acute cardiopulmonary findings. Electronically Signed   By: Harrietta Sherry M.D.   On: 08/11/2024 10:38   Patient Profile   89 y.o. female with a hx of hypertension, chronic diastolic heart failure, hypercholesterolemia, gout, hx of meningioma with previous gamma knife tx c/b seizures well controlled on topamax , paroxysmal atrial fibrillation on warfarin, CKD and tachy-brady syndrome s/p dual chamber pacemaker in  2010 with generator change in 2020,  who is being seen 08/11/2024 for the evaluation of chest pain. Her last cardiac catheterization was in 2011 which showed mild CAD [ 60% stenosis to the mid RCA, LM was without disease, 50% ostial Lcx lesion, and sequential 52% stenosis in the proximal and midportion of the LAD.] This was obtained due to progressive shortness of breath. RHC was normal.  Her most recent echocardiogram was in 2019 which showed LVEF 50-55% with no RWMA. Indeterminate  diastolic parameters.  Assessment & Plan   Chest Pain-previous electrocardiogram showed no ST changes. Troponins normal. Plan conservative measures given patient's age and baseline renal insufficiency/risk of contrast nephropathy.  FU Dr Francyne. If recurrent symptoms could consider functional study for risk stratification.    CAD-continue statin.  She is not on aspirin given need for Coumadin .   Hyperlipidemia Continue pravastatin 20   Chronic Diastolic Heart Failure-Appears euvolemic on exam; continue preadmission dose of diuretics.   Hypertension-continue preadmission medications.     Paroxysmal Atrial Fibrillation-patient is in sinus rhythm.  Continue Toprol  and verapamil .  Continue Coumadin  with goal INR 2-3.  CHA2DS2-VASc is 6.   SSS s/p pacemaker   CKD    Patient can be discharged from cardiac standpoint.  Will arrange follow-up with APP in 2 to 4 weeks.  Follow-up with Dr. Francyne 3 months. For questions or updates, please contact [facility] Please consult [email or web address] for contact info under     Signed, Redell Shallow, MD  08/12/2024, 7:29 AM

## 2024-08-12 NOTE — Care Management Obs Status (Signed)
 MEDICARE OBSERVATION STATUS NOTIFICATION   Patient Details  Name: [name] MRN: [id number] Date of Birth: 08-24-1932   Medicare Observation Status Notification Given:  Yes    Vonzell Arrie Sharps 08/12/2024, 9:48 AM

## 2024-08-12 NOTE — TOC Initial Note (Signed)
 Transition of Care (TOC) - Initial/Assessment Note  Rayfield Gobble RN, BSN Inpatient Care Management Unit 4E- RN Case Manager See Treatment Team for direct phone # [phone] cross coverage  Patient Details  Name: [name] MRN: [id number] Date of Birth: [birth year]  Transition of Care [facility]) CM/SW Contact:    Gobble Rayfield Hurst, RN Phone Number: 08/12/2024, 2:15 PM  Clinical Narrative:                 Noted order placed for HHPT,  CM in to see pt at bedside.  Discussed current living situation. Pt voiced that she lives at home with daughter. [place]  Has DME- RW, wheelchair.   Daughter to transport home when ready.   List provided for [facility] choice Per CMS guidelines from [email or web address] website with star ratings (copy placed in shadow chart)- pt voiced that she does not want anyone coming to her home for therapy at this time as she does not feel she can participate due to her current back pain.  Pt politely declined HH referral for discharge.  Explained to pt that should she get home and change her mind or her back pain improves to where she thinks she can do therapy at home - to follow up with her PCP who can order [facility] services- pt voiced understanding.     Expected Discharge Plan: Home w Home Health Services Barriers to Discharge: Continued Medical Work up   Patient Goals and CMS Choice Patient states their goals for this hospitalization and ongoing recovery are:: return home w/ daughter CMS Medicare.gov Compare Post Acute Care list provided to:: Patient Choice offered to / list presented to : Patient      Expected Discharge Plan and Services   Discharge Planning Services: CM Consult Post Acute Care Choice: Home Health Living arrangements for the past 2 months: Single Family Home Expected Discharge Date: 08/12/24               DME Arranged: N/A DME Agency: NA       HH Arranged: PT, OT, Patient Refused HH HH Agency: NA         Prior Living Arrangements/Services Living arrangements for the past 2 months: Single Family Home Lives with:: Self, Adult Children Patient language and need for interpreter reviewed:: Yes        Need for Family Participation in Patient Care: Yes (Comment) Care giver support system in place?: Yes (comment) Current home services: DME (rolling walker and wheelchair) Criminal Activity/Legal Involvement Pertinent to Current Situation/Hospitalization: No - Comment as needed  Activities of Daily Living   ADL Screening (condition at time of admission) Independently performs ADLs?: No Does the patient have a NEW difficulty with bathing/dressing/toileting/self-feeding that is expected to last >3 days?: No Does the patient have a NEW difficulty with getting in/out of bed, walking, or climbing stairs that is expected to last >3 days?: No Does the patient have a NEW difficulty with communication that is expected to last >3 days?: No Is the patient deaf or have difficulty hearing?: Yes Does the patient have difficulty seeing, even when wearing glasses/contacts?: Yes Does the patient have difficulty concentrating, remembering, or making decisions?: No  Permission Sought/Granted                  Emotional Assessment Appearance:: Appears stated age Attitude/Demeanor/Rapport: Engaged Affect (typically observed): Pleasant Orientation: : Oriented to Self, Oriented to Place, Oriented to  Time, Oriented to Situation Alcohol /  Substance Use: Not Applicable Psych Involvement: No (comment)  Admission diagnosis:  Chest pain [R07.9] Left-sided chest pain [R07.9] Patient Active Problem List   Diagnosis Date Noted   Chest pain 08/11/2024   Severe obesity (BMI 35.0-39.9) with comorbidity (HCC) 11/24/2021   Blood clotting disorder (HCC) 05/21/2021   Headache 10/25/2020   Pacemaker battery depletion 09/12/2019   Chronic diastolic heart failure (HCC) 11/17/2018   History of anticoagulant  therapy 07/12/2018   Abnormal C-reactive protein 07/12/2018   Gastroesophageal reflux disease without esophagitis 07/12/2018   History of cerebral meningioma 07/12/2018   Hyperlipidemia 07/12/2018   SSS (sick sinus syndrome) (HCC) 06/04/2016   Meningioma, multiple (HCC) 03/18/2016   Benign paroxysmal positional vertigo 03/18/2016   Shoulder pain 10/10/2015   Blood glucose abnormal 09/19/2015   Gouty arthropathy 09/19/2015   Vitamin D deficiency 09/19/2015   Exertional shortness of breath 04/10/2015   Memory difficulty 07/13/2014   Angina, class III (HCC) 06/06/2014   CAD (coronary artery disease) 06/06/2014   Gout flare of right elbow 06/06/2014   Essential hypertension 08/30/2013   CKD (chronic kidney disease) stage 3, GFR 30-59 ml/min (HCC) 08/30/2013   Seizure (HCC) 08/30/2013   Obesity (BMI 30.0-34.9) 08/30/2013   Pacemaker - dual chamber St. Jude RF device, implanted May 2010 08/30/2013   Paroxysmal atrial fibrillation (HCC) 03/08/2013   Long term current use of anticoagulant therapy 03/08/2013   PCP:  Theo Iha, MD Pharmacy:   [facility] [phone] [address], [place] - [address] [place] [address] Phone: [phone] Fax: [phone]     Social Drivers of Health (SDOH) Social History: SDOH Screenings   Food Insecurity: No Food Insecurity (08/12/2024)  Housing: Low Risk  (08/12/2024)  Transportation Needs: No Transportation Needs (08/12/2024)  Utilities: Not At Risk (08/12/2024)  Social Connections: Unknown (08/12/2024)  Tobacco Use: Medium Risk (08/11/2024)   SDOH Interventions:     Readmission Risk Interventions     No data to display

## 2024-08-12 NOTE — Hospital Course (Addendum)
 89 y.o. F with obesity, SSS s/p PPM, pAF on warfarin, nonobstructive CAD, CKD IIIb baseline 1.6-1.7, dCHF, and also meningioma s/p gamma knife and seizures (none recently) who presented with few days vague malaise and then chest pain.  In the ER, admitted for chest pain despite normal ECG normal troponins.

## 2024-08-12 NOTE — Progress Notes (Signed)
 Heart Failure Navigator Progress Note  Assessed for Heart & Vascular TOC clinic readiness.  Patient does not meet criteria due to EF 50-55%, has a scheduled [facility] appointment on 08/29/2024. .   Navigator will sign off at this time.   Stephane Haddock, BSN, Scientist, clinical (histocompatibility and immunogenetics) Only

## 2024-08-12 NOTE — Evaluation (Addendum)
 Physical Therapy Evaluation Patient Details Name: [name] MRN: [id number] DOB: Aug 13, 1932 Today's Date: 08/12/2024  History of Present Illness  89 yo female adm 08/11/24 with chest pain and LB edema. PMhx: HTN, CHF, HLD, gout, meningioma, seizures, PAF, CKD, PPM, vertigo  Clinical Impression  Pt pleasant and reports increased weakness with decreased desire to mobilize. Pt lives at home with daughter who works and does not have 24hr assist but reports she manages in home for basic transfers and assist of PCA and daughter for ADLs and IADLs. Pt concerned with ability to enter 2 steps to home and would benefit from ramp as well as HHPT yet pt not very receptive to idea of HH. Pt with decreased strength, function, mobility, gait and balance who will benefit from acute therapy to maximize mobility, safety and independence.  HR 80 at rest 140 AFib with gait       If plan is discharge home, recommend the following: A little help with walking and/or transfers;A little help with bathing/dressing/bathroom;Assistance with cooking/housework;Assist for transportation;Help with stairs or ramp for entrance   Can travel by private vehicle        Equipment Recommendations Other (comment) (ramp)  Recommendations for Other Services       Functional Status Assessment Patient has had a recent decline in their functional status and demonstrates the ability to make significant improvements in function in a reasonable and predictable amount of time.     Precautions / Restrictions Precautions Precautions: Fall      Mobility  Bed Mobility Overal bed mobility: Needs Assistance Bed Mobility: Supine to Sit     Supine to sit: Supervision     General bed mobility comments: HOB flat, use of rail, increased time    Transfers Overall transfer level: Needs assistance   Transfers: Sit to/from Stand Sit to Stand: Contact guard assist           General transfer comment: cues for hand  placement, safety and increased time and effort to power up and rise    Ambulation/Gait Ambulation/Gait assistance: Contact guard assist Gait Distance (Feet): 110 Feet Assistive device: Rolling walker (2 wheels) Gait Pattern/deviations: Step-through pattern, Decreased stride length, Trunk flexed   Gait velocity interpretation: <1.8 ft/sec, indicate of risk for recurrent falls   General Gait Details: CGA for safety with cues for proximity to RW, HR 140 with activity in AFib  Stairs            Wheelchair Mobility     Tilt Bed    Modified Rankin (Stroke Patients Only)       Balance Overall balance assessment: Needs assistance Sitting-balance support: No upper extremity supported, Feet supported Sitting balance-Leahy Scale: Fair     Standing balance support: Bilateral upper extremity supported, During functional activity, Reliant on assistive device for balance Standing balance-Leahy Scale: Poor Standing balance comment: Rw in standing                             Pertinent Vitals/Pain Pain Assessment Pain Assessment: No/denies pain    Home Living Family/patient expects to be discharged to:: Private residence Living Arrangements: Children Available Help at Discharge: Family;Available PRN/intermittently Type of Home: House Home Access: Stairs to enter Entrance Stairs-Rails: Left Entrance Stairs-Number of Steps: 2   Home Layout: One level Home Equipment: Rolling Walker (2 wheels);BSC/3in1;Cane - single point;Shower seat;Wheelchair - manual Additional Comments: PCA MWF 4 hours    Prior Function Prior Level of  Function : Needs assist             Mobility Comments: walks in house with RW ADLs Comments: assist for ADLs, toilets independently and self feeds, daughter and PCA do IADLs, doesn't drive     Extremity/Trunk Assessment   Upper Extremity Assessment Upper Extremity Assessment: Generalized weakness    Lower Extremity Assessment Lower  Extremity Assessment: Generalized weakness    Cervical / Trunk Assessment Cervical / Trunk Assessment: Kyphotic  Communication   Communication Communication: No apparent difficulties    Cognition Arousal: Alert Behavior During Therapy: WFL for tasks assessed/performed   PT - Cognitive impairments: Safety/Judgement                       PT - Cognition Comments: slow processing and decreased safety awareness Following commands: Intact       Cueing Cueing Techniques: Verbal cues     General Comments      Exercises     Assessment/Plan    PT Assessment Patient needs continued PT services  PT Problem List Decreased activity tolerance;Decreased balance;Decreased mobility;Decreased knowledge of use of DME       PT Treatment Interventions DME instruction;Gait training;Stair training;Functional mobility training;Therapeutic activities;Therapeutic exercise;Patient/family education;Balance training    PT Goals (Current goals can be found in the Care Plan section)  Acute Rehab PT Goals Patient Stated Goal: return home PT Goal Formulation: With patient Time For Goal Achievement: 08/26/24 Potential to Achieve Goals: Fair    Frequency Min 2X/week     Co-evaluation               AM-PAC PT 6 Clicks Mobility  Outcome Measure Help needed turning from your back to your side while in a flat bed without using bedrails?: None Help needed moving from lying on your back to sitting on the side of a flat bed without using bedrails?: A Little Help needed moving to and from a bed to a chair (including a wheelchair)?: A Little Help needed standing up from a chair using your arms (e.g., wheelchair or bedside chair)?: A Little Help needed to walk in hospital room?: A Little Help needed climbing 3-5 steps with a railing? : A Lot 6 Click Score: 18    End of Session Equipment Utilized During Treatment: Gait belt Activity Tolerance: Patient tolerated treatment well Patient  left: in chair;with call bell/phone within reach;with chair alarm set Nurse Communication: Mobility status PT Visit Diagnosis: Other abnormalities of gait and mobility (R26.89);Muscle weakness (generalized) (M62.81);Difficulty in walking, not elsewhere classified (R26.2)    Time: 9249-9189 PT Time Calculation (min) (ACUTE ONLY): 20 min   Charges:   PT Evaluation $PT Eval Moderate Complexity: 1 Mod   PT General Charges $$ ACUTE PT VISIT: 1 Visit         Lenoard SQUIBB, PT Acute Rehabilitation Services Office: [phone]   Lenoard NOVAK Mckyle Solanki 08/12/2024, 8:50 AM

## 2024-08-12 NOTE — Progress Notes (Signed)
  Echocardiogram 2D Echocardiogram has been performed.  Koleen KANDICE Popper, RDCS 08/12/2024, 11:29 AM

## 2024-08-12 NOTE — Progress Notes (Signed)
 PHARMACY - ANTICOAGULATION CONSULT NOTE  Pharmacy Consult for Warfarin Indication: atrial fibrillation  Allergies  Allergen Reactions   Topamax  [Topiramate ] Other (See Comments)    Weird dreams Possible visual hallucinations   Dilantin [Phenytoin Sodium Extended] Hives   Luminal Sodium [Phenobarbital] Hives          Patient Measurements: Weight: 102.7 kg (226 lb 6.4 oz)  Vital Signs: Temp: 98 F (36.7 C) (08/22 0813) Temp Source: Oral (08/22 0813) BP: 158/82 (08/22 0813) Pulse Rate: 92 (08/22 0941)  Labs: Recent Labs    08/11/24 1041 08/11/24 1213 08/11/24 1824 08/12/24 0540  HGB 9.4*  --   --  8.5*  HCT 33.0*  --   --  29.3*  PLT 269  --   --  265  LABPROT  --   --  30.9* 31.5*  INR  --   --  2.8* 2.9*  CREATININE 1.73*  --   --  1.62*  TROPONINIHS 8 7  --   --     Estimated Creatinine Clearance: 28.5 mL/min (A) (by C-G formula based on SCr of 1.62 mg/dL (H)).   Medical History: Past Medical History:  Diagnosis Date   ACL (anterior cruciate ligament) rupture    Left   Acute MI (HCC)    Arthritis    Back pain    Secondary to arthritis   Benign paroxysmal positional vertigo 03/18/2016   Brain tumor [facility])    On right side. Removed in 1989   Bruit    Duplex doppler 03/28/05 Mildly abnormal. *Right subclavian artery: Less than 50% diameter reduction.   Chest pain    2D Echo 04/21/08 EF = >55%. Moderate tricuspid regurgitation. Persantine Myovoew stress test 04/21/08 EF = 81%, no evidence of ischemia noted.   Chronic anticoagulation    PAF   Chronic edema    Mild LE edema   Chronic kidney disease    Mild, stage 2   Coronary artery disease    Coronary atherosclerosis    Minimal   Dyslipidemia    GERD (gastroesophageal reflux disease)    Hyperlipidemia    On pravastatin   Memory difficulty 07/13/2014   Meningioma (HCC)    Resection in 1998   Mild depression    Ovarian cyst    Benign. Had a salpiingoophorectomy in 2005.   PAF (paroxysmal atrial  fibrillation) (HCC)    On coumadin . St. Jude PPM (serial [id number]) inserted 2010   Paroxysmal atrial flutter (HCC)    Seizure (HCC) 2006   Severe obesity (HCC)    Sick sinus syndrome (HCC)    Sinus node dysfunction (HCC)    S/P implantation of a dual-chamber permanent pacemaker in 2010   SOB (shortness of breath) on exertion    Class II. Catheterization 04/30/10 showed mild CAD with mildly elevated right heart pressures.    Tachycardia-bradycardia syndrome (HCC)    S/P implantation of dual-chamber permanent pacemaker in 2010   Trochlear nerve palsy    Right   Vertigo     Medications:  Scheduled:   [START ON 08/13/2024] gabapentin   100 mg Oral Daily   metoprolol  succinate  25 mg Oral Daily   pantoprazole   40 mg Oral Daily   sodium chloride  flush  3 mL Intravenous Q12H   verapamil   180 mg Oral QHS   Warfarin - Pharmacist Dosing Inpatient   Does not apply q1600   Infusions:  PRN: acetaminophen  **OR** acetaminophen , hydrALAZINE , polyethylene glycol  Assessment: 89 yo female on chronic warfarin for hx  afib. Home dose reported as 4mg  daily - last taken 8/20. INR on admit is therapeutic at 2.8  INR today remains in goal range at 2.9.  No overt bleeding or complications noted.  Goal of Therapy:  INR 2-3   Plan:  Warfarin 4mg  daily as per home dosing. Daily PT/INR Monitor for signs/symptoms of bleeding  Harlene Barlow, [employer], [facility] Clinical Pharmacist  08/12/2024 11:02 AM   [facility] pharmacy phone numbers are listed on [email or web address]

## 2024-08-12 NOTE — Progress Notes (Signed)
 Progress Note   Patient: [name] FMW:[id number] DOB: May 21, 1932 DOA: 08/11/2024     0 DOS: the patient was seen and examined on 08/12/2024        Brief hospital course: 89 y.o. F with obesity, SSS s/p PPM, pAF on AC, nonobstructive CAD, CKD IIIb baseline 1.6-1.7 and dCHF who presented with few days vague malaise and then chest pain.  In the ER, admitted for chest pain despite normal ECG normal troponins.     Assessment and Plan: Malaise This morning, patient having difficulty getting out of chair alone (at baseline she is fully independent), feels off-balance and extremely fatigued.    No fever, sore throat, cough, aches, rash, joint pains, dyspnea, swelling, sputum, abdominal discomfort, vomiting, melena, bowel changes, urinary irritative symptoms, focal weakness, seizure-like activity.  She does have worsening of her chronic facial pain headache which she describes like trigeminal neuralgia.    No signs of CHF.  LFTs and renal function normal.  TSH normal.  Hgb is low and trended down from admission, but no bleeding observed or reported.  No convincing signs/symptoms of infection but at this point, she states she can barely walk and doesn't feel safe going up the stairs to her house. - PT/OT - Trend CBC - Obtain echo - Check CRP/ESR, CK   Noncardiac chest pain Patient has transient, nonpositional, nonexertional vague character left subpectoral chest pain intermittently in the last day or so.  Her echo, troponins and ECG are all unremarkable.  Low suspicion for PE. - No further work up planned  - Follow up Cardiology   Anemia Also noted to have new anemia.   - Monitor symptoms - Trend Hgb  Headache This appears to be new.  Seen by Neuro, who ruled out temporal arteritis.  Her optho suggested it was trigeminal neuralgia and started gabapentin , which daughter says helps. - Continue gabapentin   Paroxysmal atrial fibrillation Sick sinus syndrome - Continue  warfarin, verapamil , metoprolol   Class 2 obesity BMI 38, complicates care  Coronary artery disease Chronic diastolic CHF CXR clear, no definite SOB symptoms.   - Hold torsemide  1 day - Hold pravastatin  Chronic kidney disease stage IIIb Cr at baseline 1.4  Gout - Continue allopurinol       Subjective: Malaise, weakness, fatigue, headache.  No GI symptoms, no reported bleeding.  No fever, no cough or chest discomfort.     Physical Exam: BP (!) 137/58 (BP Location: Left Arm)   Pulse 60   Temp 98.4 F (36.9 C) (Oral)   Resp 18   Ht 5' 4 (1.626 m)   Wt 102.7 kg   SpO2 99%   BMI 38.86 kg/m   Elderly obese female, sitting in recliner, difficult and very unsteady and standing up. RRR, no murmurs, no pitting in the extremities, chronic venous stasis changes Respiratory rate seems normal, lung sounds diminished but no rales or wheezes appreciated Abdomen soft, no tenderness palpation or guarding Attention normal, affect tired, judgment and insight appear baseline, she is oriented x 3 but has some short-term memory loss it appears, face symmetric, speech fluent, severe generalized weakness, no tremor    Data Reviewed: Basic metabolic panel shows creatinine 1.6, no change Normal electrolytes CBC shows hemoglobin down to 8.5, normal white count  BNP minimally elevated Troponins normal TSH normal LFTs normal   Family Communication: Daughter at the bedside    Disposition: Status is: Observation         Author: Lonni SHAUNNA Dalton, MD 08/12/2024 4:08  PM  For on call review [email or web address].

## 2024-08-12 NOTE — Progress Notes (Signed)
 Initial Nutrition Assessment  DOCUMENTATION CODES:   Obesity unspecified  INTERVENTION:   -Recommend liberalization to regular menu for QOL, maintaining good PO intake  -Add Ensure Plus High Protein BID -Encouraged adequate kcal/pro intake   NUTRITION DIAGNOSIS:   Predicted suboptimal nutrient intake related to decreased appetite, chronic illness as evidenced by per patient/family report.  GOAL:   Patient will meet greater than or equal to 90% of their needs  MONITOR:   PO intake, Skin, Labs, Weight trends, Supplement acceptance  REASON FOR ASSESSMENT:   Consult Assessment of nutrition requirement/status  ASSESSMENT:   Hx hypertension, chronic diastolic heart failure, hypercholesterolemia, gout, hx of meningioma with previous gamma knife tx c/b seizures well controlled on topamax , paroxysmal atrial fibrillation on warfarin, CKD and tachy-brady syndrome s/p dual chamber pacemaker in 2010 with generator change in 2020.  Spoke to pt at bedside. Pt denies n/v/c/d or chewing/swallowing difficulties. Last BM 8/20. Pt with weight gain of +12.4 kg (13.7%) x 1 yr. Discussed PO intake at home, pt states she drinks an Ensure for dinner sometimes, has peanut butter crackers as a meal sometimes. She states she had cheerios for breakfast this morning. Documented PO intake of 100% breakfast this morning. Meal ticket has Cheerios, 2%, Banana and Coffee: 371 kcal, 13 g protein. Suspect weight gain r/t fluid accumulation, decrease in mobility, possible increased kcal/pro intake, though, from pt report it doesn't seem like she is eating excessive kcal at home. Discussed nutritional goals of care with pt. Pt states she is almost 89 years old and that she wants to eat whatever. This RD agrees with pt for QOL she should eat what she wants. Recommend liberalizing diet to regular for increased options. Pt denies additional questions/concerns at this time, will sign off at this time-please consult again  prn.   Labs Cr 1.62 Albumin 2.9 GFR 30 H/H 8.5/29.3 A1c 5.5  Medications  [START ON 08/13/2024] gabapentin   100 mg Oral Daily   metoprolol  succinate  25 mg Oral Daily   pantoprazole   40 mg Oral Daily   [START ON 08/13/2024] pneumococcal 20-valent conjugate vaccine  0.5 mL Intramuscular Tomorrow-1000   sodium chloride  flush  3 mL Intravenous Q12H   verapamil   180 mg Oral QHS   warfarin  4 mg Oral q1600   Warfarin - Pharmacist Dosing Inpatient   Does not apply q1600     NUTRITION - FOCUSED PHYSICAL EXAM:  Flowsheet Row Most Recent Value  Orbital Region Mild depletion  Upper Arm Region No depletion  Thoracic and Lumbar Region No depletion  Buccal Region No depletion  Temple Region Mild depletion  Clavicle Bone Region No depletion  Clavicle and Acromion Bone Region No depletion  Scapular Bone Region No depletion  Dorsal Hand Mild depletion  Patellar Region Mild depletion  Anterior Thigh Region Mild depletion  Posterior Calf Region Mild depletion  Edema (RD Assessment) Mild  Hair Reviewed  Eyes Reviewed  Mouth Reviewed  Skin Reviewed  Nails Reviewed    Diet Order:   Diet Order             Diet - low sodium heart healthy           Diet 2 gram sodium Room service appropriate? Yes; Fluid consistency: Thin  Diet effective now                   EDUCATION NEEDS:   Education needs have been addressed  Skin:  Skin Assessment: Reviewed RN Assessment  Last BM:  8/22  Height:   Ht Readings from Last 1 Encounters:  07/20/24 5' 6 (1.676 m)    Weight:   Wt Readings from Last 1 Encounters:  08/12/24 102.7 kg    BMI:  Body mass index is 36.54 kg/m.  Estimated Nutritional Needs:   Kcal:  1400-1750 kcal  Protein:  70-90 g  Fluid:  >/=1.4L  [name], RDN, LDN Registered Dietitian Nutritionist RD Inpatient Contact Info in [place]

## 2024-08-13 DIAGNOSIS — I200 Unstable angina: Secondary | ICD-10-CM | POA: Diagnosis not present

## 2024-08-13 LAB — BASIC METABOLIC PANEL WITH GFR
Anion gap: 4 — ABNORMAL LOW (ref 5–15)
BUN: 17 mg/dL (ref 8–23)
CO2: 27 mmol/L (ref 22–32)
Calcium: 9.3 mg/dL (ref 8.9–10.3)
Chloride: 109 mmol/L (ref 98–111)
Creatinine, Ser: 1.55 mg/dL — ABNORMAL HIGH (ref 0.44–1.00)
GFR, Estimated: 32 mL/min — ABNORMAL LOW (ref >60)
Glucose, Bld: 86 mg/dL (ref 70–99)
Potassium: 3.8 mmol/L (ref 3.5–5.1)
Sodium: 140 mmol/L (ref 135–145)

## 2024-08-13 LAB — C-REACTIVE PROTEIN: CRP: 0.6 mg/dL (ref <1.0)

## 2024-08-13 LAB — CBC
HCT: 28.4 % — ABNORMAL LOW (ref 36.0–46.0)
Hemoglobin: 8.2 g/dL — ABNORMAL LOW (ref 12.0–15.0)
MCH: 22.2 pg — ABNORMAL LOW (ref 26.0–34.0)
MCHC: 28.9 g/dL — ABNORMAL LOW (ref 30.0–36.0)
MCV: 77 fL — ABNORMAL LOW (ref 80.0–100.0)
Platelets: 264 K/uL (ref 150–400)
RBC: 3.69 MIL/uL — ABNORMAL LOW (ref 3.87–5.11)
RDW: 18.3 % — ABNORMAL HIGH (ref 11.5–15.5)
WBC: 6.4 K/uL (ref 4.0–10.5)
nRBC: 0 % (ref 0.0–0.2)

## 2024-08-13 LAB — FERRITIN: Ferritin: 8 ng/mL — ABNORMAL LOW (ref 11–307)

## 2024-08-13 LAB — PROTIME-INR
INR: 3.2 — ABNORMAL HIGH (ref 0.8–1.2)
Prothrombin Time: 34.6 s — ABNORMAL HIGH (ref 11.4–15.2)

## 2024-08-13 LAB — IRON AND TIBC
Iron: 15 ug/dL — ABNORMAL LOW (ref 28–170)
Saturation Ratios: 4 % — ABNORMAL LOW (ref 10.4–31.8)
TIBC: 402 ug/dL (ref 250–450)
UIBC: 387 ug/dL

## 2024-08-13 LAB — SEDIMENTATION RATE: Sed Rate: 39 mm/h — ABNORMAL HIGH (ref 0–22)

## 2024-08-13 LAB — CK: Total CK: 91 U/L (ref 38–234)

## 2024-08-13 MED ORDER — IRON SUCROSE 200 MG IVPB - SIMPLE MED
200.0000 mg | Freq: Once | Status: AC
  Administered 2024-08-13: 200 mg via INTRAVENOUS
  Filled 2024-08-13: qty 200

## 2024-08-13 NOTE — Progress Notes (Incomplete)
 PHARMACY - ANTICOAGULATION CONSULT NOTE  Pharmacy Consult for Warfarin Indication: atrial fibrillation  Allergies  Allergen Reactions   Topamax  [Topiramate ] Other (See Comments)    Weird dreams Possible visual hallucinations   Dilantin [Phenytoin Sodium Extended] Hives   Luminal Sodium [Phenobarbital] Hives         Patient Measurements: Height: 5' 4 (162.6 cm) Weight: 103.2 kg (227 lb 8 oz) IBW/kg (Calculated) : 54.7  Vital Signs: Temp: 98.2 F (36.8 C) (08/23 0754) Temp Source: Oral (08/23 0754) BP: 147/68 (08/23 1007) Pulse Rate: 66 (08/23 1007)  Labs: Recent Labs    08/11/24 1041 08/11/24 1213 08/11/24 1824 08/12/24 0540 08/13/24 0458 08/13/24 0939  HGB 9.4*  --   --  8.5* 8.2*  --   HCT 33.0*  --   --  29.3* 28.4*  --   PLT 269  --   --  265 264  --   LABPROT  --   --  30.9* 31.5*  --  34.6*  INR  --   --  2.8* 2.9*  --  3.2*  CREATININE 1.73*  --   --  1.62* 1.55*  --   CKTOTAL  --   --   --   --  91  --   TROPONINIHS 8 7  --   --   --   --    Estimated Creatinine Clearance: 28.8 mL/min (A) (by C-G formula based on SCr of 1.55 mg/dL (H)).  Medical History: Past Medical History:  Diagnosis Date   ACL (anterior cruciate ligament) rupture    Left   Acute MI (HCC)    Arthritis    Back pain    Secondary to arthritis   Benign paroxysmal positional vertigo 03/18/2016   Brain tumor [facility])    On right side. Removed in 1989   Bruit    Duplex doppler 03/28/05 Mildly abnormal. *Right subclavian artery: Less than 50% diameter reduction.   Chest pain    2D Echo 04/21/08 EF = >55%. Moderate tricuspid regurgitation. Persantine Myovoew stress test 04/21/08 EF = 81%, no evidence of ischemia noted.   Chronic anticoagulation    PAF   Chronic edema    Mild LE edema   Chronic kidney disease    Mild, stage 2   Coronary artery disease    Coronary atherosclerosis    Minimal   Dyslipidemia    GERD (gastroesophageal reflux disease)    Hyperlipidemia    On pravastatin    Memory difficulty 07/13/2014   Meningioma (HCC)    Resection in 1998   Mild depression    Ovarian cyst    Benign. Had a salpiingoophorectomy in 2005.   PAF (paroxysmal atrial fibrillation) (HCC)    On coumadin . St. Jude PPM (serial [id number]) inserted 2010   Paroxysmal atrial flutter (HCC)    Seizure (HCC) 2006   Severe obesity (HCC)    Sick sinus syndrome (HCC)    Sinus node dysfunction (HCC)    S/P implantation of a dual-chamber permanent pacemaker in 2010   SOB (shortness of breath) on exertion    Class II. Catheterization 04/30/10 showed mild CAD with mildly elevated right heart pressures.    Tachycardia-bradycardia syndrome (HCC)    S/P implantation of dual-chamber permanent pacemaker in 2010   Trochlear nerve palsy    Right   Vertigo     Medications:  Scheduled:   feeding supplement  237 mL Oral BID BM   gabapentin   100 mg Oral Daily   metoprolol  succinate  25 mg Oral Daily   pantoprazole   40 mg Oral Daily   sodium chloride  flush  3 mL Intravenous Q12H   verapamil   180 mg Oral QHS   warfarin  4 mg Oral q1600   Warfarin - Pharmacist Dosing Inpatient   Does not apply q1600   Infusions:  PRN: acetaminophen  **OR** acetaminophen , hydrALAZINE , polyethylene glycol  Assessment: 89 yo female on chronic warfarin for hx afib. Home dose reported as 4mg  daily - last taken 8/20. INR on admit is therapeutic at 2.8  INR increased to supratherapeutic level of 3.2 while on home regimen of warfarin 4 mg daily. Hgb mostly stable in the 8s, plts are WNL. No issues with bleeding documented.   Goal of Therapy:  INR 2-3   Plan:  Give reduced dose of 3 mg warfarin today given supratherapeutic INR and trend towards increased INR.  Daily PT/INR.  Monitor for signs/symptoms of bleeding.   Maurilio Patten, PharmD PGY1 Pharmacy Resident [facility] 08/13/2024 10:24 AM

## 2024-08-13 NOTE — Progress Notes (Signed)
 PHARMACY - ANTICOAGULATION CONSULT NOTE  Pharmacy Consult for Warfarin Indication: atrial fibrillation  Allergies  Allergen Reactions   Topamax  [Topiramate ] Other (See Comments)    Weird dreams Possible visual hallucinations   Dilantin [Phenytoin Sodium Extended] Hives   Luminal Sodium [Phenobarbital] Hives          Patient Measurements: Height: 5' 4 (162.6 cm) Weight: 103.2 kg (227 lb 8 oz) IBW/kg (Calculated) : 54.7  Vital Signs: Temp: 98.3 F (36.8 C) (08/23 1153) Temp Source: Oral (08/23 1153) BP: 153/64 (08/23 1153) Pulse Rate: 60 (08/23 1153)  Labs: Recent Labs    08/11/24 1041 08/11/24 1213 08/11/24 1824 08/12/24 0540 08/13/24 0458 08/13/24 0939  HGB 9.4*  --   --  8.5* 8.2*  --   HCT 33.0*  --   --  29.3* 28.4*  --   PLT 269  --   --  265 264  --   LABPROT  --   --  30.9* 31.5*  --  34.6*  INR  --   --  2.8* 2.9*  --  3.2*  CREATININE 1.73*  --   --  1.62* 1.55*  --   CKTOTAL  --   --   --   --  91  --   TROPONINIHS 8 7  --   --   --   --     Estimated Creatinine Clearance: 28.8 mL/min (A) (by C-G formula based on SCr of 1.55 mg/dL (H)).   Medical History: Past Medical History:  Diagnosis Date   ACL (anterior cruciate ligament) rupture    Left   Acute MI (HCC)    Arthritis    Back pain    Secondary to arthritis   Benign paroxysmal positional vertigo 03/18/2016   Brain tumor [facility])    On right side. Removed in 1989   Bruit    Duplex doppler 03/28/05 Mildly abnormal. *Right subclavian artery: Less than 50% diameter reduction.   Chest pain    2D Echo 04/21/08 EF = >55%. Moderate tricuspid regurgitation. Persantine Myovoew stress test 04/21/08 EF = 81%, no evidence of ischemia noted.   Chronic anticoagulation    PAF   Chronic edema    Mild LE edema   Chronic kidney disease    Mild, stage 2   Coronary artery disease    Coronary atherosclerosis    Minimal   Dyslipidemia    GERD (gastroesophageal reflux disease)    Hyperlipidemia    On  pravastatin   Memory difficulty 07/13/2014   Meningioma (HCC)    Resection in 1998   Mild depression    Ovarian cyst    Benign. Had a salpiingoophorectomy in 2005.   PAF (paroxysmal atrial fibrillation) (HCC)    On coumadin . St. Jude PPM (serial [id number]) inserted 2010   Paroxysmal atrial flutter (HCC)    Seizure (HCC) 2006   Severe obesity (HCC)    Sick sinus syndrome (HCC)    Sinus node dysfunction (HCC)    S/P implantation of a dual-chamber permanent pacemaker in 2010   SOB (shortness of breath) on exertion    Class II. Catheterization 04/30/10 showed mild CAD with mildly elevated right heart pressures.    Tachycardia-bradycardia syndrome (HCC)    S/P implantation of dual-chamber permanent pacemaker in 2010   Trochlear nerve palsy    Right   Vertigo     Medications:  Scheduled:   feeding supplement  237 mL Oral BID BM   gabapentin   100 mg Oral Daily  metoprolol  succinate  25 mg Oral Daily   pantoprazole   40 mg Oral Daily   sodium chloride  flush  3 mL Intravenous Q12H   verapamil   180 mg Oral QHS   warfarin  4 mg Oral q1600   Warfarin - Pharmacist Dosing Inpatient   Does not apply q1600   Infusions:   iron  sucrose     PRN: acetaminophen  **OR** acetaminophen , hydrALAZINE , polyethylene glycol  Assessment: 89 yo female on chronic warfarin for hx afib. Home dose reported as 4mg  daily - last taken 8/20. INR on admit was therapeutic at 2.8  INR today was supratherapeutic at 3.2 today. However, patient will be discharging today before next warfarin dose will be due. Patient has 4 mg tablets at home.   Goal of Therapy:  INR 2-3   Plan:  Instruct patient to take 1/2 tab (2 mg) this evening after discharge, then tomorrow continue usual home dose of warfarin 4 mg daily.  Patient will follow up next week with INR check.   Maurilio Patten, PharmD PGY1 Pharmacy Resident [facility] 08/13/2024 1:30 PM

## 2024-08-13 NOTE — Progress Notes (Signed)
 AVS Printed and reviewed.  Patient expressed verbal understanding regarding pharmacy recommendations with warfarin dosage and discharge plan of care.  Denies Pain or discomfort at time of discharge.  Daughter at bedside.    Med list completed by Primary RN.   Transferred to Main A.

## 2024-08-13 NOTE — Discharge Summary (Signed)
 Physician Discharge Summary   Patient: [name] MRN: [id number] DOB: December 20, 1932  Admit date:     08/11/2024  Discharge date: 08/13/24  Discharge Physician: Lonni SHAUNNA Dalton   PCP: Theo Iha, MD     Recommendations at discharge:  Follow up with PCP for iron  deficiency within 1 week Dr. Theo: Please check Hgb and INR within 1 week (discharge Hgb 8.2) Please trend Hgb after that at appropriate interval based on repeat Please check ferritin/iron /TIBC in 4-6 weeks If Hgb trends down, but not <7 g/dL, recommend GI referral If Hgb slow to improve, recommend Hematology consultation and/or EPO level      Discharge Diagnoses: Principal Problem:   Chest pain and fatigue Other hospital problems Malaise Anemia Headache Paroxysmal atrial fibrillation Sick sinus syndrome Class II obesity Coronary artery disease Chronic diastolic congestive heart failure Chronic kidney disease stage IIIb Gout       Hospital Course: 89 y.o. F with obesity, SSS s/p PPM, pAF on warfarin, nonobstructive CAD, CKD IIIb baseline 1.6-1.7, dCHF, and also meningioma s/p gamma knife and seizures (none recently) who presented with few days vague malaise and then chest pain.  In the ER, admitted for chest pain despite normal ECG normal troponins.     Fatigue/malaise due to iron  deficiency anemia due to chronic blood loss After admission, patient's main concern was progressive fatigue, weakness, although this was clouded by reports of chest pain, as well as reports of her longer standing complaints of right facial pain/headache and changes to vision since her brain surgery  She had no clinical signs of CHF, liver testing was normal, TSH normal.  CK normal, sed rate minimally elevated, CRP normal.  Although no bleeding was observed or reported, she was noted to have drop of her hemoglobin from previous.  From what I can see in outside records, her previous hemoglobin was in the range  of 10.  Here was 9.5, and dropped to 8.2 at the time of discharge.  Iron  studies markedly low.  With her daughter, the patient and I did discuss remaining in the hospital for GI evaluation and possible scope versus watchful waiting.   Patient was clear that in the event a malignancy was found, she would not want chemo or surgery, which is reasonable.  Given there was no clinical evidence (melena, hemodynamic change) to suggest active bleeding, there is a strong suspicion her iron  deficiency is chronic blood loss and a conservative approach is safe.  Given IV iron  in the hospital, discharged on oral iron .  Recommend serial Hgb measurement by PCP as outlined above.    Given return precautions re: melena, hematemesis, dizziness.          Noncardiac chest pain Patient has transient, nonpositional, nonexertional vague character left subpectoral chest pain intermittently in the last day or so.  Her echo, troponins and ECG are all unremarkable.  Low suspicion for PE.  No ischemic work up needed.  Has follow up with Cardiology pending this fall, sooner PRN.    Headache This appears to be new.  Seen by Neuro, who ruled out temporal arteritis.  Her optho suggested it was trigeminal neuralgia and started gabapentin , which daughter says helps.    Coronary artery disease Chronic diastolic CHF CXR clear, no clear orthopnea, PND (rather fatigue).   Chronic kidney disease stage IIIb Cr at baseline 1.4                   The [place]  Controlled Substances Registry was  reviewed for this patient prior to discharge.  Consultants: Cardiology Procedures performed: Echocardiogram  Disposition: Home Diet recommendation:  Discharge Diet Orders (From admission, onward)     Start     Ordered   08/12/24 0000  Diet - low sodium heart healthy        08/12/24 0909             DISCHARGE MEDICATION: Allergies as of 08/13/2024       Reactions   Topamax  [topiramate ] Other (See  Comments)   Weird dreams Possible visual hallucinations   Dilantin [phenytoin Sodium Extended] Hives   Luminal Sodium [phenobarbital] Hives           Medication List     TAKE these medications    allopurinol 100 MG tablet Commonly known as: ZYLOPRIM Take 100 mg by mouth daily.   ARTIFICIAL TEAR OP Place 1 drop into both eyes 2 (two) times daily as needed (dry eyes).   colchicine  0.6 MG tablet Take 1 tablet (0.6 mg total) by mouth daily as needed (gout flare).   gabapentin  300 MG capsule Commonly known as: NEURONTIN  Take 300 mg by mouth 2 (two) times daily as needed (headache, eye pain).   gabapentin  100 MG capsule Commonly known as: Neurontin  Take 1 capsule (100 mg total) by mouth daily.   metoprolol  succinate 25 MG 24 hr tablet Commonly known as: TOPROL -XL Take 25 mg by mouth daily.   nitroGLYCERIN  0.4 MG/SPRAY spray Commonly known as: NITROLINGUAL  Place 3 sprays under the tongue every 5 (five) minutes x 3 doses as needed for chest pain.   omeprazole 20 MG capsule Commonly known as: PRILOSEC Take 20 mg by mouth daily.   potassium chloride  10 MEQ tablet Commonly known as: KLOR-CON  M TAKE 1 TABLET BY MOUTH EVERY DAY   pravastatin 20 MG tablet Commonly known as: PRAVACHOL Take 20 mg by mouth at bedtime.   torsemide  20 MG tablet Commonly known as: DEMADEX  TAKE 2 TABLETS (40 MG TOTAL) BY MOUTH DAILY. What changed:  how much to take when to take this additional instructions   traMADol  50 MG tablet Commonly known as: ULTRAM  Take 50 mg by mouth 2 (two) times daily as needed (back pain).   verapamil  180 MG 24 hr capsule Commonly known as: VERELAN  Take 1 capsule (180 mg total) by mouth at bedtime.   warfarin 4 MG tablet Commonly known as: COUMADIN  Take 4 mg by mouth at bedtime.        Follow-up Information     Theo Iha, MD. Schedule an appointment as soon as possible for a visit in 1 week(s).   Specialty: Internal Medicine Contact  information: [address] [place] [address] [phone]                 Discharge Instructions     Diet - low sodium heart healthy   Complete by: As directed    Discharge instructions   Complete by: As directed    **IMPORTANT DISCHARGE INSTRUCTIONS**   From Dr. Jonel: You were admitted for chest pains and also fatigue/weakness.  Here, for the chest pain, you were evaluated by Cardiology, had a chest x-ray, EKG (electrical activity of the heart), troponins, and echocardiogram These were all reassuring and showed no new signs of illness  For the fatigue, we checked your thyroid , muscles, kidney function which were normal.  We looked for signs of infection and found none.    The abnormal thing we found is anemia with iron   deficiency, implying that you are losing blood from your intestinal tract.  As we discussed, an aggressive approach would be staying in the hospital, GI evaluation and possibly colonoscopy and endoscopy.  A conservative approach would be avoiding invasive tests for now and monitoring the blood level closely with Dr. Theo.  I agree with you that a conservative approach is smart.  We gave you intravenous iron  in the hospital Take iron  supplements at home: take ferrous sulfate  325 mg every other day Take the ferrous sulfate  with docusate/Colace  Dr. Theo knows to check your blood level within 1 week and your iron  levels in a month or so.   ALSO:  Your warfarin level today was 3.2 TONIGHT ONLY Cut your warfarin tablet in half (take warfarin 2 mg tonight) After tonight, resume your normal warfarin 4 mg nightly and have Dr. Theo check your INR when she checks your blood level this week  Continue all your other medicines without change.  Continue the omeprazole for sure.   Increase activity slowly   Complete by: As directed        Discharge Exam: Filed Weights   08/12/24 0243 08/13/24 0513  Weight: 102.7 kg 103.2 kg     General: Pt is alert, awake, not in acute distress Cardiovascular: RRR, nl S1-S2, no murmurs appreciated.   No LE edema.   Respiratory: Normal respiratory rate and rhythm.  CTAB without rales or wheezes. Abdominal: Abdomen soft and non-tender.  No distension or HSM.   Neuro/Psych: Strength symmetric in upper and lower extremities.  Judgment and insight appear normal.   Condition at discharge: good  The results of significant diagnostics from this hospitalization (including imaging, microbiology, ancillary and laboratory) are listed below for reference.   Imaging Studies: ECHOCARDIOGRAM COMPLETE Result Date: 08/12/2024    ECHOCARDIOGRAM REPORT   Patient Name:   QIANA LANDGREBE Date of Exam: 08/12/2024 Medical Rec #:  [id number]         Height:       66.0 in Accession #:    [id number]        Weight:       226.4 lb Date of Birth:  [birth year]        BSA:          2.108 m Patient Age:    89 years          BP:           158/82 mmHg Patient Gender: F                 HR:           67 bpm. Exam Location:  Inpatient Procedure: 2D Echo, Cardiac Doppler and Color Doppler (Both Spectral and Color            Flow Doppler were utilized during procedure). Indications:    Chest Pain R07.9  History:        Patient has prior history of Echocardiogram examinations, most                 recent 11/25/2018. CHF, CKD, Arrythmias:Atrial Fibrillation,                 Signs/Symptoms:Dyspnea; Risk Factors:Former Smoker, Hypertension                 and Dyslipidemia.  Sonographer:    Koleen Popper RDCS Referring Phys: LONNI SQUIBB Jatara Huettner IMPRESSIONS  1. Left ventricular ejection fraction, by estimation, is 60 to 65%. The left ventricle  has normal function. The left ventricle has no regional wall motion abnormalities. There is moderate asymmetric left ventricular hypertrophy of the basal-septal segment. Left ventricular diastolic parameters are indeterminate.  2. Right ventricular systolic function is normal. The right  ventricular size is normal. There is normal pulmonary artery systolic pressure.  3. The mitral valve is normal in structure. Trivial mitral valve regurgitation. No evidence of mitral stenosis.  4. The aortic valve is tricuspid. There is moderate calcification of the aortic valve. There is moderate thickening of the aortic valve. Aortic valve regurgitation is not visualized. Aortic valve sclerosis is present, with no evidence of aortic valve stenosis.  5. The inferior vena cava is dilated in size with <50% respiratory variability, suggesting right atrial pressure of 15 mmHg. Comparison(s): No significant change from prior study. FINDINGS  Left Ventricle: Left ventricular ejection fraction, by estimation, is 60 to 65%. The left ventricle has normal function. The left ventricle has no regional wall motion abnormalities. The left ventricular internal cavity size was normal in size. There is  moderate asymmetric left ventricular hypertrophy of the basal-septal segment. Left ventricular diastolic parameters are indeterminate. Right Ventricle: The right ventricular size is normal. No increase in right ventricular wall thickness. Right ventricular systolic function is normal. There is normal pulmonary artery systolic pressure. The tricuspid regurgitant velocity is 2.26 m/s, and  with an assumed right atrial pressure of 15 mmHg, the estimated right ventricular systolic pressure is 35.4 mmHg. Left Atrium: Left atrial size was normal in size. Right Atrium: Right atrial size was normal in size. Pericardium: There is no evidence of pericardial effusion. Mitral Valve: The mitral valve is normal in structure. Trivial mitral valve regurgitation. No evidence of mitral valve stenosis. Tricuspid Valve: The tricuspid valve is normal in structure. Tricuspid valve regurgitation is trivial. No evidence of tricuspid stenosis. Aortic Valve: The aortic valve is tricuspid. There is moderate calcification of the aortic valve. There is moderate  thickening of the aortic valve. Aortic valve regurgitation is not visualized. Aortic valve sclerosis is present, with no evidence of aortic valve stenosis. Pulmonic Valve: The pulmonic valve was not well visualized. Pulmonic valve regurgitation is not visualized. No evidence of pulmonic stenosis. Aorta: The aortic root and ascending aorta are structurally normal, with no evidence of dilitation. Venous: The inferior vena cava is dilated in size with less than 50% respiratory variability, suggesting right atrial pressure of 15 mmHg. IAS/Shunts: The atrial septum is grossly normal. Additional Comments: A device lead is visualized in the right atrium and right ventricle.  LEFT VENTRICLE PLAX 2D LVIDd:         4.50 cm   Diastology LVIDs:         2.80 cm   LV e' medial:    4.46 cm/s LV PW:         1.10 cm   LV E/e' medial:  23.1 LV IVS:        1.20 cm   LV e' lateral:   13.20 cm/s LVOT diam:     1.90 cm   LV E/e' lateral: 7.8 LV SV:         53 LV SV Index:   25 LVOT Area:     2.84 cm  RIGHT VENTRICLE             IVC RV S prime:     13.80 cm/s  IVC diam: 2.50 cm TAPSE (M-mode): 2.4 cm LEFT ATRIUM  Index        RIGHT ATRIUM           Index LA diam:        3.60 cm 1.71 cm/m   RA Area:     13.50 cm LA Vol (A2C):   33.2 ml 15.75 ml/m  RA Volume:   30.00 ml  14.23 ml/m LA Vol (A4C):   66.8 ml 31.69 ml/m LA Biplane Vol: 47.6 ml 22.58 ml/m  AORTIC VALVE LVOT Vmax:   99.00 cm/s LVOT Vmean:  66.600 cm/s LVOT VTI:    0.188 m  AORTA Ao Root diam: 2.90 cm Ao Asc diam:  2.90 cm MITRAL VALVE                TRICUSPID VALVE MV Area (PHT): 3.08 cm     TR Peak grad:   20.4 mmHg MV Decel Time: 246 msec     TR Vmax:        226.00 cm/s MV E velocity: 103.00 cm/s MV A velocity: 31.20 cm/s   SHUNTS MV E/A ratio:  3.30         Systemic VTI:  0.19 m                             Systemic Diam: 1.90 cm Shelda Bruckner MD Electronically signed by Shelda Bruckner MD Signature Date/Time: 08/12/2024/3:48:00 PM    Final     DG Chest Port 1 View Result Date: 08/11/2024 CLINICAL DATA:  Chest pain. EXAM: PORTABLE CHEST 1 VIEW COMPARISON:  03/30/2020. FINDINGS: The heart size and mediastinal contours are within normal limits. Left-sided dual lead pacemaker with leads overlying the right atrium and right ventricle. Aortic atherosclerosis. Minimal linear scarring/atelectasis in the left mid to lower lung zone, similar to the prior exam. No focal consolidation, pleural effusion, or pneumothorax. Advanced degenerative changes of the bilateral glenohumeral joints. IMPRESSION: No acute cardiopulmonary findings. Electronically Signed   By: Harrietta Sherry M.D.   On: 08/11/2024 10:38    Microbiology: Results for orders placed or performed during the hospital encounter of 09/12/19  Surgical PCR screen     Status: None   Collection Time: 09/12/19 11:56 AM   Specimen: Nasal Mucosa; Nasal Swab  Result Value Ref Range Status   MRSA, PCR NEGATIVE NEGATIVE Final   Staphylococcus aureus NEGATIVE NEGATIVE Final    Comment: (NOTE) The Xpert SA Assay (FDA approved for NASAL specimens in patients 27 years of age and older), is one component of a comprehensive surveillance program. It is not intended to diagnose infection nor to guide or monitor treatment. Performed at [facility], [address] [place], [place] [address]     Labs: CBC: Recent Labs  Lab 08/11/24 1041 08/12/24 0540 08/13/24 0458  WBC 6.1 6.5 6.4  NEUTROABS 3.9 3.3  --   HGB 9.4* 8.5* 8.2*  HCT 33.0* 29.3* 28.4*  MCV 78.6* 76.1* 77.0*  PLT 269 265 264   Basic Metabolic Panel: Recent Labs  Lab 08/11/24 1041 08/12/24 0540 08/13/24 0458  NA 145 142 140  K 3.6 3.7 3.8  CL 106 109 109  CO2 23 26 27   GLUCOSE 136* 90 86  BUN 21 18 17   CREATININE 1.73* 1.62* 1.55*  CALCIUM 9.9 9.5 9.3  MG  --  2.0  --   PHOS  --  3.1  --    Liver Function Tests: Recent Labs  Lab 08/11/24 2044 08/12/24 0540  AST 25 23  ALT 18 17  ALKPHOS 85 73   BILITOT 0.4 0.5  PROT 5.9* 5.6*  ALBUMIN 3.1* 2.9*   CBG: No results for input(s): GLUCAP in the last 168 hours.  Discharge time spent: approximately 45 minutes spent on discharge counseling, evaluation of patient on day of discharge, and coordination of discharge planning with nursing, social work, pharmacy and case management  Signed: Lonni SHAUNNA Dalton, MD [facility] 08/13/2024

## 2024-08-13 NOTE — Discharge Instructions (Signed)
**  IMPORTANT DISCHARGE INSTRUCTIONS**   From Dr. Jonel: You were admitted for chest pains and also fatigue/weakness.  Here, for the chest pain, you were evaluated by Cardiology, had a chest x-ray, EKG (electrical activity of the heart), troponins, and echocardiogram These were all reassuring and showed no new signs of illness  For the fatigue, we checked your thyroid , muscles, kidney function which were normal.  We looked for signs of infection and found none.    The abnormal thing we found is anemia with iron  deficiency, implying that you are losing blood from your intestinal tract.  As we discussed, an aggressive approach would be staying in the hospital, GI evaluation and possibly colonoscopy and endoscopy.  A conservative approach would be avoiding invasive tests for now and monitoring the blood level closely with Dr. Theo.  I agree with you that a conservative approach is smart.  We gave you intravenous iron  in the hospital Take iron  supplements at home: take ferrous sulfate  325 mg every other day Take the ferrous sulfate  with docusate/Colace  Dr. Theo knows to check your blood level within 1 week and your iron  levels in a month or so.   ALSO: Your warfarin level today was 3.2 TONIGHT ONLY Cut your warfarin tablet in half (take warfarin 2 mg tonight) After tonight, resume your normal warfarin 4 mg nightly and have Dr. Theo check your INR when she checks your blood level this week  Continue all your other medicines without change.  Continue the omeprazole for sure.

## 2024-08-18 ENCOUNTER — Ambulatory Visit: Admitting: Podiatry

## 2024-08-28 NOTE — Progress Notes (Deleted)
  Cardiology Office Note   Date:  08/28/2024  ID:  Paitynn, Mikus 08/07/1932, MRN [id number] PCP: Theo Iha, MD  [facility] Providers Cardiologist:  Jerel Balding, MD   History of Present Illness Kitty ZAMYIAH TINO is a 89 y.o. female with past medical history of obesity, SSS status post PPM, PAF on warfarin, nonobstructive CAD, CKD stage IIIb with baseline creatinine 1.6-1.7, diastolic CHF, and also meningioma status post gamma knife and seizures who is here for follow-up appointment.  Was recently seen in the ER for chest pain despite normal EKG and normal troponins.  Was seen in the office recently for vague malaise and chest pain.   After admission patient's main concern was progressive fatigue, weakness, although this was clouded by reports of chest pain as well as reports of her longstanding complaints of right facial pain/headache and changes to vision since her brain surgery.  No clinical signs of CHF, liver testing was normal, TSH normal, CK normal, sed rate minimally elevated, CRP normal.  Although no bleeding was observed or reported she was noted to have a drop in her hemoglobin from previous.  Was 10 and then it was 9.5 and then dropped down to 8.2 at the time of discharge.  Iron  studies were low.  She was given IV iron  in the hospital and was discharged on iron  pills.  Troponin and EKGs were unremarkable.  Low suspicion for PE  Today, she ***  ROS: ***  Studies Reviewed      *** Risk Assessment/Calculations {Does this patient have ATRIAL FIBRILLATION?:[id number]} No BP recorded.  {Refresh Note OR Click here to enter BP  :1}***       Physical Exam VS:  There were no vitals taken for this visit.       Wt Readings from Last 3 Encounters:  08/13/24 227 lb 8 oz (103.2 kg)  07/20/24 221 lb 6.4 oz (100.4 kg)  05/02/24 214 lb (97.1 kg)    GEN: Well nourished, well developed in no acute distress NECK: No JVD; No carotid bruits CARDIAC: ***RRR, no  murmurs, rubs, gallops RESPIRATORY:  Clear to auscultation without rales, wheezing or rhonchi  ABDOMEN: Soft, non-tender, non-distended EXTREMITIES:  No edema; No deformity   ASSESSMENT AND PLAN Chest pain Fatigue/weakness Iron  deficiency anemia PAF on warfarin Diastolic CHF SSS s/p PPM    {Are you ordering a CV Procedure (e.g. stress test, cath, DCCV, TEE, etc)?   Press F2        :[id number]}  Dispo: ***  Signed, Orren LOISE Fabry, PA-C

## 2024-08-29 ENCOUNTER — Ambulatory Visit: Attending: Physician Assistant | Admitting: Physician Assistant

## 2024-08-29 DIAGNOSIS — I10 Essential (primary) hypertension: Secondary | ICD-10-CM

## 2024-08-29 DIAGNOSIS — E7800 Pure hypercholesterolemia, unspecified: Secondary | ICD-10-CM

## 2024-08-29 DIAGNOSIS — I2510 Atherosclerotic heart disease of native coronary artery without angina pectoris: Secondary | ICD-10-CM

## 2024-08-29 DIAGNOSIS — Z950 Presence of cardiac pacemaker: Secondary | ICD-10-CM

## 2024-08-29 DIAGNOSIS — I480 Paroxysmal atrial fibrillation: Secondary | ICD-10-CM

## 2024-08-29 DIAGNOSIS — I495 Sick sinus syndrome: Secondary | ICD-10-CM

## 2024-08-29 DIAGNOSIS — I5032 Chronic diastolic (congestive) heart failure: Secondary | ICD-10-CM

## 2024-09-05 ENCOUNTER — Ambulatory Visit (INDEPENDENT_AMBULATORY_CARE_PROVIDER_SITE_OTHER): Payer: Medicare Other

## 2024-09-05 DIAGNOSIS — I495 Sick sinus syndrome: Secondary | ICD-10-CM

## 2024-09-06 LAB — CUP PACEART REMOTE DEVICE CHECK
Battery Remaining Longevity: 58 mo
Battery Remaining Percentage: 54 %
Battery Voltage: 2.99 V
Brady Statistic AP VP Percent: 1 %
Brady Statistic AP VS Percent: 93 %
Brady Statistic AS VP Percent: 1 %
Brady Statistic AS VS Percent: 6.2 %
Brady Statistic RA Percent Paced: 65 %
Brady Statistic RV Percent Paced: 11 %
Date Time Interrogation Session: 20250915040015
Implantable Lead Connection Status: 753985
Implantable Lead Connection Status: 753985
Implantable Lead Implant Date: 20100507
Implantable Lead Implant Date: 20100507
Implantable Lead Location: 753859
Implantable Lead Location: 753860
Implantable Pulse Generator Implant Date: 20200921
Lead Channel Impedance Value: 360 Ohm
Lead Channel Impedance Value: 430 Ohm
Lead Channel Pacing Threshold Amplitude: 0.625 V
Lead Channel Pacing Threshold Amplitude: 1 V
Lead Channel Pacing Threshold Pulse Width: 0.5 ms
Lead Channel Pacing Threshold Pulse Width: 0.5 ms
Lead Channel Sensing Intrinsic Amplitude: 10.9 mV
Lead Channel Sensing Intrinsic Amplitude: 3.6 mV
Lead Channel Setting Pacing Amplitude: 1.625
Lead Channel Setting Pacing Amplitude: 2.5 V
Lead Channel Setting Pacing Pulse Width: 0.5 ms
Lead Channel Setting Sensing Sensitivity: 4 mV
Pulse Gen Model: 2272
Pulse Gen Serial Number: 9166074

## 2024-09-12 NOTE — Progress Notes (Signed)
 Remote PPM Transmission

## 2024-09-17 ENCOUNTER — Ambulatory Visit: Payer: Self-pay | Admitting: Cardiovascular Disease

## 2024-09-26 ENCOUNTER — Other Ambulatory Visit: Payer: Self-pay | Admitting: Cardiovascular Disease

## 2024-09-27 ENCOUNTER — Encounter: Payer: Self-pay | Admitting: Podiatry

## 2024-09-27 ENCOUNTER — Ambulatory Visit: Admitting: Podiatry

## 2024-09-27 DIAGNOSIS — B351 Tinea unguium: Secondary | ICD-10-CM | POA: Diagnosis not present

## 2024-09-27 DIAGNOSIS — M79675 Pain in left toe(s): Secondary | ICD-10-CM

## 2024-09-27 DIAGNOSIS — N183 Chronic kidney disease, stage 3 unspecified: Secondary | ICD-10-CM

## 2024-09-27 DIAGNOSIS — M79674 Pain in right toe(s): Secondary | ICD-10-CM | POA: Diagnosis not present

## 2024-09-27 NOTE — Progress Notes (Signed)
 This patient returns to my office for at risk foot care.  This patient requires this care by a professional since this patient will be at risk due to having coagulation defect and  CKD   This patient is unable to cut nails herself since the patient cannot reach her nails.These nails are painful walking and wearing shoes.  This patient presents for at risk foot care today. Patient presents to the office with her daughter.  General Appearance  Alert, conversant and in no acute stress.  Vascular  Dorsalis pedis and posterior tibial  pulses are not  palpable  Left foot.  Dorsalis pedis pulses are weakly palpable right foot due to swelling..  Capillary return is within normal limits  bilaterally. Temperature is within normal limits  bilaterally.  Neurologic  Senn-Weinstein monofilament wire test within normal limits  bilaterally. Muscle power within normal limits bilaterally.  Nails Thick disfigured discolored nails with subungual debris  from hallux to fifth toes bilaterally. No evidence of bacterial infection or drainage bilaterally.  Orthopedic  No limitations of motion  feet .  No crepitus or effusions noted.  No bony pathology .  Hammer toes 2,3 left foot.  Skin  normotropic skin with no porokeratosis noted bilaterally.  No signs of infections or ulcers noted.     Onychomycosis  Pain in right toes  Pain in left toes  Consent was obtained for treatment procedures.   Mechanical debridement of nails 1-5  bilaterally performed with a nail nipper.  Filed with dremel without incident.    Return office visit  12   weeks                    Told patient to return for periodic foot care and evaluation due to potential at risk complications.   Cordella Bold DPM

## 2024-10-03 ENCOUNTER — Ambulatory Visit: Admitting: Neurology

## 2024-10-25 ENCOUNTER — Telehealth: Payer: Self-pay | Admitting: Neurology

## 2024-10-25 ENCOUNTER — Ambulatory Visit: Admitting: Neurology

## 2024-10-25 NOTE — Telephone Encounter (Signed)
 Called pt to reschedule appt with Dr. Chalice 11/4. Spoke with pt daughter. Pt daughter asked me to put a note in the pt chart stating to never call the pt before 8am, states it is very disturbing that a doctors office would contact someone at 6:30am and she will be calling back to get in touch with a production designer, theatre/television/film.

## 2024-11-15 ENCOUNTER — Ambulatory Visit: Admitting: Neurology

## 2024-12-05 ENCOUNTER — Ambulatory Visit: Payer: Medicare Other

## 2024-12-05 DIAGNOSIS — I495 Sick sinus syndrome: Secondary | ICD-10-CM

## 2024-12-06 ENCOUNTER — Ambulatory Visit: Admitting: Podiatry

## 2024-12-06 ENCOUNTER — Telehealth: Payer: Self-pay | Admitting: Podiatry

## 2024-12-06 LAB — CUP PACEART REMOTE DEVICE CHECK
Battery Remaining Longevity: 57 mo
Battery Remaining Percentage: 51 %
Battery Voltage: 2.99 V
Brady Statistic AP VP Percent: 1 %
Brady Statistic AP VS Percent: 93 %
Brady Statistic AS VP Percent: 1 %
Brady Statistic AS VS Percent: 6.2 %
Brady Statistic RA Percent Paced: 67 %
Brady Statistic RV Percent Paced: 10 %
Date Time Interrogation Session: 20251215044643
Implantable Lead Connection Status: 753985
Implantable Lead Connection Status: 753985
Implantable Lead Implant Date: 20100507
Implantable Lead Implant Date: 20100507
Implantable Lead Location: 753859
Implantable Lead Location: 753860
Implantable Pulse Generator Implant Date: 20200921
Lead Channel Impedance Value: 390 Ohm
Lead Channel Impedance Value: 460 Ohm
Lead Channel Pacing Threshold Amplitude: 0.625 V
Lead Channel Pacing Threshold Amplitude: 1 V
Lead Channel Pacing Threshold Pulse Width: 0.5 ms
Lead Channel Pacing Threshold Pulse Width: 0.5 ms
Lead Channel Sensing Intrinsic Amplitude: 12 mV
Lead Channel Sensing Intrinsic Amplitude: 4.7 mV
Lead Channel Setting Pacing Amplitude: 1.625
Lead Channel Setting Pacing Amplitude: 2.5 V
Lead Channel Setting Pacing Pulse Width: 0.5 ms
Lead Channel Setting Sensing Sensitivity: 4 mV
Pulse Gen Model: 2272
Pulse Gen Serial Number: 9166074

## 2024-12-06 NOTE — Telephone Encounter (Signed)
 Patient called in regards to rescheduling her appointment she has with Dr. Loreda due to her ankle and legs swelling. I offered her an appointment with one of our providers here for this Thursday to take a look at her ankles, however she declined and said she has an appointment with her primary care doctor for this Friday.

## 2024-12-09 NOTE — Progress Notes (Signed)
 Remote PPM Transmission

## 2024-12-17 ENCOUNTER — Ambulatory Visit: Payer: Self-pay | Admitting: Cardiovascular Disease

## 2025-01-10 ENCOUNTER — Ambulatory Visit: Admitting: Podiatry

## 2025-01-10 DIAGNOSIS — B351 Tinea unguium: Secondary | ICD-10-CM | POA: Diagnosis not present

## 2025-01-10 DIAGNOSIS — D689 Coagulation defect, unspecified: Secondary | ICD-10-CM | POA: Diagnosis not present

## 2025-01-10 DIAGNOSIS — M79675 Pain in left toe(s): Secondary | ICD-10-CM | POA: Diagnosis not present

## 2025-01-10 DIAGNOSIS — M79674 Pain in right toe(s): Secondary | ICD-10-CM | POA: Diagnosis not present

## 2025-01-10 DIAGNOSIS — N183 Chronic kidney disease, stage 3 unspecified: Secondary | ICD-10-CM | POA: Diagnosis not present

## 2025-01-10 NOTE — Progress Notes (Signed)
 This patient returns to my office for at risk foot care.  This patient requires this care by a professional since this patient will be at risk due to having coagulation defect and  CKD   This patient is unable to cut nails herself since the patient cannot reach her nails.These nails are painful walking and wearing shoes.  This patient presents for at risk foot care today. Patient presents to the office with her daughter.  General Appearance  Alert, conversant and in no acute stress.  Vascular  Dorsalis pedis and posterior tibial  pulses are not  palpable  Left foot.  Dorsalis pedis pulses are weakly palpable right foot due to swelling..  Capillary return is within normal limits  bilaterally. Temperature is within normal limits  bilaterally.  Neurologic  Senn-Weinstein monofilament wire test within normal limits  bilaterally. Muscle power within normal limits bilaterally.  Nails Thick disfigured discolored nails with subungual debris  from hallux to fifth toes bilaterally. No evidence of bacterial infection or drainage bilaterally.  Orthopedic  No limitations of motion  feet .  No crepitus or effusions noted.  No bony pathology .  Hammer toes 2,3 left foot.  Skin  normotropic skin with no porokeratosis noted bilaterally.  No signs of infections or ulcers noted.     Onychomycosis  Pain in right toes  Pain in left toes  Consent was obtained for treatment procedures.   Mechanical debridement of nails 1-5  bilaterally performed with a nail nipper.  Filed with dremel without incident.    Return office visit  12   weeks                    Told patient to return for periodic foot care and evaluation due to potential at risk complications.  Patient says she has pain in her big toe right foot at night.  No evidence of redness or swelling in toenail.  I told her I could order blood work for her.  She and her daughter say they  have a medical doctor for her heart who can perform that test.   Cordella Bold  DPM

## 2025-03-23 ENCOUNTER — Ambulatory Visit: Admitting: Podiatry

## 2025-03-30 ENCOUNTER — Ambulatory Visit: Admitting: Neurology
